# Patient Record
Sex: Female | Born: 1940 | Race: Black or African American | Hispanic: No | Smoking: Never smoker
Health system: Southern US, Community
[De-identification: ages and names within clinical notes are randomized; demographics above are authoritative.]

## PROBLEM LIST (undated history)

## (undated) DIAGNOSIS — IMO0001 Reserved for inherently not codable concepts without codable children: Secondary | ICD-10-CM

## (undated) DIAGNOSIS — K589 Irritable bowel syndrome without diarrhea: Secondary | ICD-10-CM

## (undated) DIAGNOSIS — E876 Hypokalemia: Secondary | ICD-10-CM

## (undated) DIAGNOSIS — K5909 Other constipation: Secondary | ICD-10-CM

## (undated) DIAGNOSIS — K219 Gastro-esophageal reflux disease without esophagitis: Secondary | ICD-10-CM

## (undated) DIAGNOSIS — M25559 Pain in unspecified hip: Secondary | ICD-10-CM

## (undated) DIAGNOSIS — M069 Rheumatoid arthritis, unspecified: Secondary | ICD-10-CM

## (undated) DIAGNOSIS — K921 Melena: Secondary | ICD-10-CM

## (undated) DIAGNOSIS — IMO0002 Reserved for concepts with insufficient information to code with codable children: Secondary | ICD-10-CM

## (undated) DIAGNOSIS — H5450 Low vision, one eye, unspecified eye: Secondary | ICD-10-CM

## (undated) DIAGNOSIS — L910 Hypertrophic scar: Secondary | ICD-10-CM

## (undated) DIAGNOSIS — C50919 Malignant neoplasm of unspecified site of unspecified female breast: Secondary | ICD-10-CM

## (undated) DIAGNOSIS — I10 Essential (primary) hypertension: Secondary | ICD-10-CM

## (undated) DIAGNOSIS — R1084 Generalized abdominal pain: Secondary | ICD-10-CM

## (undated) DIAGNOSIS — R5383 Other fatigue: Secondary | ICD-10-CM

## (undated) DIAGNOSIS — E785 Hyperlipidemia, unspecified: Secondary | ICD-10-CM

## (undated) DIAGNOSIS — R300 Dysuria: Secondary | ICD-10-CM

## (undated) DIAGNOSIS — H409 Unspecified glaucoma: Secondary | ICD-10-CM

## (undated) DIAGNOSIS — R1011 Right upper quadrant pain: Secondary | ICD-10-CM

## (undated) DIAGNOSIS — K760 Fatty (change of) liver, not elsewhere classified: Secondary | ICD-10-CM

## (undated) DIAGNOSIS — M1990 Unspecified osteoarthritis, unspecified site: Secondary | ICD-10-CM

## (undated) DIAGNOSIS — Z86010 Personal history of colonic polyps: Secondary | ICD-10-CM

## (undated) DIAGNOSIS — Z923 Personal history of irradiation: Secondary | ICD-10-CM

## (undated) DIAGNOSIS — N281 Cyst of kidney, acquired: Secondary | ICD-10-CM

## (undated) DIAGNOSIS — R1013 Epigastric pain: Secondary | ICD-10-CM

## (undated) DIAGNOSIS — R209 Unspecified disturbances of skin sensation: Secondary | ICD-10-CM

## (undated) DIAGNOSIS — G4700 Insomnia, unspecified: Secondary | ICD-10-CM

## (undated) DIAGNOSIS — R110 Nausea: Secondary | ICD-10-CM

## (undated) DIAGNOSIS — D126 Benign neoplasm of colon, unspecified: Secondary | ICD-10-CM

## (undated) DIAGNOSIS — K648 Other hemorrhoids: Secondary | ICD-10-CM

## (undated) DIAGNOSIS — R011 Cardiac murmur, unspecified: Secondary | ICD-10-CM

## (undated) DIAGNOSIS — R5381 Other malaise: Secondary | ICD-10-CM

## (undated) DIAGNOSIS — J209 Acute bronchitis, unspecified: Secondary | ICD-10-CM

## (undated) DIAGNOSIS — E039 Hypothyroidism, unspecified: Secondary | ICD-10-CM

## (undated) DIAGNOSIS — Z853 Personal history of malignant neoplasm of breast: Secondary | ICD-10-CM

## (undated) DIAGNOSIS — K7689 Other specified diseases of liver: Secondary | ICD-10-CM

## (undated) DIAGNOSIS — R079 Chest pain, unspecified: Secondary | ICD-10-CM

## (undated) DIAGNOSIS — M549 Dorsalgia, unspecified: Secondary | ICD-10-CM

## (undated) DIAGNOSIS — D369 Benign neoplasm, unspecified site: Secondary | ICD-10-CM

## (undated) HISTORY — DX: Melena: K92.1

## (undated) HISTORY — DX: Right upper quadrant pain: R10.11

## (undated) HISTORY — DX: Insomnia, unspecified: G47.00

## (undated) HISTORY — DX: Fatty (change of) liver, not elsewhere classified: K76.0

## (undated) HISTORY — DX: Gastro-esophageal reflux disease without esophagitis: K21.9

## (undated) HISTORY — PX: OTHER SURGICAL HISTORY: SHX169

## (undated) HISTORY — DX: Rheumatoid arthritis, unspecified: M06.9

## (undated) HISTORY — DX: Essential (primary) hypertension: I10

## (undated) HISTORY — DX: Irritable bowel syndrome without diarrhea: K58.9

## (undated) HISTORY — DX: Reserved for concepts with insufficient information to code with codable children: IMO0002

## (undated) HISTORY — DX: Generalized abdominal pain: R10.84

## (undated) HISTORY — DX: Nausea: R11.0

## (undated) HISTORY — DX: Unspecified disturbances of skin sensation: R20.9

## (undated) HISTORY — PX: TOE SURGERY: SHX1073

## (undated) HISTORY — DX: Personal history of colonic polyps: Z86.010

## (undated) HISTORY — DX: Hypokalemia: E87.6

## (undated) HISTORY — PX: SHOULDER SURGERY: SHX246

## (undated) HISTORY — DX: Malignant neoplasm of unspecified site of unspecified female breast: C50.919

## (undated) HISTORY — DX: Unspecified glaucoma: H40.9

## (undated) HISTORY — DX: Cyst of kidney, acquired: N28.1

## (undated) HISTORY — DX: Unspecified osteoarthritis, unspecified site: M19.90

## (undated) HISTORY — DX: Epigastric pain: R10.13

## (undated) HISTORY — DX: Cardiac murmur, unspecified: R01.1

## (undated) HISTORY — PX: TUBAL LIGATION: SHX77

## (undated) HISTORY — DX: Benign neoplasm, unspecified site: D36.9

## (undated) HISTORY — DX: Hyperlipidemia, unspecified: E78.5

## (undated) HISTORY — DX: Benign neoplasm of colon, unspecified: D12.6

## (undated) HISTORY — DX: Hypothyroidism, unspecified: E03.9

## (undated) HISTORY — DX: Reserved for inherently not codable concepts without codable children: IMO0001

## (undated) HISTORY — DX: Acute bronchitis, unspecified: J20.9

## (undated) HISTORY — DX: Hypertrophic scar: L91.0

## (undated) HISTORY — DX: Other hemorrhoids: K64.8

## (undated) HISTORY — PX: OVARIAN CYST REMOVAL: SHX89

## (undated) HISTORY — DX: Other constipation: K59.09

## (undated) HISTORY — DX: Other malaise: R53.81

## (undated) HISTORY — DX: Dysuria: R30.0

## (undated) HISTORY — DX: Other fatigue: R53.83

## (undated) HISTORY — DX: Personal history of malignant neoplasm of breast: Z85.3

## (undated) HISTORY — DX: Other specified diseases of liver: K76.89

## (undated) HISTORY — DX: Chest pain, unspecified: R07.9

## (undated) HISTORY — DX: Dorsalgia, unspecified: M54.9

## (undated) HISTORY — DX: Low vision, one eye, unspecified eye: H54.50

## (undated) HISTORY — DX: Pain in unspecified hip: M25.559

---

## 1997-09-24 ENCOUNTER — Other Ambulatory Visit: Admission: RE | Admit: 1997-09-24 | Discharge: 1997-09-24 | Payer: Self-pay | Admitting: *Deleted

## 1998-09-28 ENCOUNTER — Other Ambulatory Visit: Admission: RE | Admit: 1998-09-28 | Discharge: 1998-09-28 | Payer: Self-pay | Admitting: *Deleted

## 1999-06-12 ENCOUNTER — Encounter: Payer: Self-pay | Admitting: Internal Medicine

## 1999-06-12 ENCOUNTER — Ambulatory Visit (HOSPITAL_COMMUNITY): Admission: RE | Admit: 1999-06-12 | Discharge: 1999-06-12 | Payer: Self-pay | Admitting: Internal Medicine

## 1999-09-19 ENCOUNTER — Encounter: Admission: RE | Admit: 1999-09-19 | Discharge: 1999-09-19 | Payer: Self-pay | Admitting: *Deleted

## 1999-09-19 ENCOUNTER — Encounter: Payer: Self-pay | Admitting: *Deleted

## 1999-10-10 ENCOUNTER — Other Ambulatory Visit: Admission: RE | Admit: 1999-10-10 | Discharge: 1999-10-10 | Payer: Self-pay | Admitting: *Deleted

## 1999-11-16 ENCOUNTER — Encounter: Payer: Self-pay | Admitting: General Surgery

## 1999-11-16 ENCOUNTER — Encounter: Admission: RE | Admit: 1999-11-16 | Discharge: 1999-11-16 | Payer: Self-pay | Admitting: General Surgery

## 1999-11-17 ENCOUNTER — Ambulatory Visit (HOSPITAL_BASED_OUTPATIENT_CLINIC_OR_DEPARTMENT_OTHER): Admission: RE | Admit: 1999-11-17 | Discharge: 1999-11-17 | Payer: Self-pay | Admitting: General Surgery

## 1999-11-17 ENCOUNTER — Encounter (INDEPENDENT_AMBULATORY_CARE_PROVIDER_SITE_OTHER): Payer: Self-pay | Admitting: *Deleted

## 2000-03-21 ENCOUNTER — Encounter (INDEPENDENT_AMBULATORY_CARE_PROVIDER_SITE_OTHER): Payer: Self-pay

## 2000-03-21 ENCOUNTER — Other Ambulatory Visit: Admission: RE | Admit: 2000-03-21 | Discharge: 2000-03-21 | Payer: Self-pay | Admitting: *Deleted

## 2000-04-08 DIAGNOSIS — D126 Benign neoplasm of colon, unspecified: Secondary | ICD-10-CM

## 2000-04-08 HISTORY — DX: Benign neoplasm of colon, unspecified: D12.6

## 2000-04-18 ENCOUNTER — Encounter (INDEPENDENT_AMBULATORY_CARE_PROVIDER_SITE_OTHER): Payer: Self-pay | Admitting: Specialist

## 2000-04-18 ENCOUNTER — Other Ambulatory Visit: Admission: RE | Admit: 2000-04-18 | Discharge: 2000-04-18 | Payer: Self-pay | Admitting: Gastroenterology

## 2000-12-06 ENCOUNTER — Other Ambulatory Visit: Admission: RE | Admit: 2000-12-06 | Discharge: 2000-12-06 | Payer: Self-pay | Admitting: Obstetrics & Gynecology

## 2000-12-17 ENCOUNTER — Encounter: Payer: Self-pay | Admitting: Obstetrics & Gynecology

## 2000-12-17 ENCOUNTER — Encounter: Admission: RE | Admit: 2000-12-17 | Discharge: 2000-12-17 | Payer: Self-pay | Admitting: Obstetrics & Gynecology

## 2001-12-10 ENCOUNTER — Encounter: Admission: RE | Admit: 2001-12-10 | Discharge: 2001-12-10 | Payer: Self-pay | Admitting: Internal Medicine

## 2001-12-10 ENCOUNTER — Encounter: Payer: Self-pay | Admitting: Internal Medicine

## 2001-12-19 ENCOUNTER — Encounter: Payer: Self-pay | Admitting: Internal Medicine

## 2001-12-19 ENCOUNTER — Ambulatory Visit (HOSPITAL_COMMUNITY): Admission: RE | Admit: 2001-12-19 | Discharge: 2001-12-19 | Payer: Self-pay | Admitting: Internal Medicine

## 2002-02-24 ENCOUNTER — Other Ambulatory Visit: Admission: RE | Admit: 2002-02-24 | Discharge: 2002-02-24 | Payer: Self-pay | Admitting: Obstetrics and Gynecology

## 2002-04-14 ENCOUNTER — Encounter: Payer: Self-pay | Admitting: Obstetrics & Gynecology

## 2002-04-14 ENCOUNTER — Encounter: Admission: RE | Admit: 2002-04-14 | Discharge: 2002-04-14 | Payer: Self-pay | Admitting: Obstetrics & Gynecology

## 2002-04-26 ENCOUNTER — Encounter: Payer: Self-pay | Admitting: Internal Medicine

## 2002-04-26 ENCOUNTER — Encounter: Admission: RE | Admit: 2002-04-26 | Discharge: 2002-04-26 | Payer: Self-pay | Admitting: Internal Medicine

## 2002-10-06 ENCOUNTER — Ambulatory Visit (HOSPITAL_COMMUNITY): Admission: RE | Admit: 2002-10-06 | Discharge: 2002-10-06 | Payer: Self-pay | Admitting: General Surgery

## 2002-10-06 ENCOUNTER — Ambulatory Visit (HOSPITAL_BASED_OUTPATIENT_CLINIC_OR_DEPARTMENT_OTHER): Admission: RE | Admit: 2002-10-06 | Discharge: 2002-10-06 | Payer: Self-pay | Admitting: General Surgery

## 2002-10-06 ENCOUNTER — Encounter (INDEPENDENT_AMBULATORY_CARE_PROVIDER_SITE_OTHER): Payer: Self-pay | Admitting: *Deleted

## 2003-06-01 ENCOUNTER — Encounter: Admission: RE | Admit: 2003-06-01 | Discharge: 2003-06-01 | Payer: Self-pay | Admitting: Internal Medicine

## 2003-06-10 ENCOUNTER — Encounter: Admission: RE | Admit: 2003-06-10 | Discharge: 2003-06-10 | Payer: Self-pay | Admitting: Endocrinology

## 2003-06-18 ENCOUNTER — Ambulatory Visit (HOSPITAL_COMMUNITY): Admission: RE | Admit: 2003-06-18 | Discharge: 2003-06-18 | Payer: Self-pay | Admitting: Endocrinology

## 2003-06-18 ENCOUNTER — Encounter (INDEPENDENT_AMBULATORY_CARE_PROVIDER_SITE_OTHER): Payer: Self-pay | Admitting: Specialist

## 2003-08-24 ENCOUNTER — Encounter: Admission: RE | Admit: 2003-08-24 | Discharge: 2003-08-24 | Payer: Self-pay | Admitting: *Deleted

## 2003-11-24 ENCOUNTER — Ambulatory Visit (HOSPITAL_COMMUNITY): Admission: RE | Admit: 2003-11-24 | Discharge: 2003-11-24 | Payer: Self-pay | Admitting: Gastroenterology

## 2003-11-26 ENCOUNTER — Observation Stay (HOSPITAL_COMMUNITY): Admission: RE | Admit: 2003-11-26 | Discharge: 2003-11-27 | Payer: Self-pay | Admitting: General Surgery

## 2003-11-26 ENCOUNTER — Encounter (INDEPENDENT_AMBULATORY_CARE_PROVIDER_SITE_OTHER): Payer: Self-pay | Admitting: Specialist

## 2004-02-11 ENCOUNTER — Ambulatory Visit: Payer: Self-pay | Admitting: Internal Medicine

## 2004-04-08 ENCOUNTER — Emergency Department (HOSPITAL_COMMUNITY): Admission: EM | Admit: 2004-04-08 | Discharge: 2004-04-08 | Payer: Self-pay | Admitting: Family Medicine

## 2004-08-09 ENCOUNTER — Ambulatory Visit: Payer: Self-pay | Admitting: Gastroenterology

## 2004-08-14 ENCOUNTER — Ambulatory Visit: Payer: Self-pay | Admitting: Gastroenterology

## 2004-10-11 ENCOUNTER — Encounter: Admission: RE | Admit: 2004-10-11 | Discharge: 2004-10-11 | Payer: Self-pay | Admitting: Internal Medicine

## 2005-06-04 ENCOUNTER — Emergency Department (HOSPITAL_COMMUNITY): Admission: EM | Admit: 2005-06-04 | Discharge: 2005-06-04 | Payer: Self-pay | Admitting: Emergency Medicine

## 2005-09-06 ENCOUNTER — Ambulatory Visit: Payer: Self-pay | Admitting: Endocrinology

## 2005-10-04 ENCOUNTER — Ambulatory Visit: Payer: Self-pay | Admitting: Internal Medicine

## 2005-10-15 ENCOUNTER — Ambulatory Visit: Payer: Self-pay | Admitting: Internal Medicine

## 2005-10-25 ENCOUNTER — Encounter: Admission: RE | Admit: 2005-10-25 | Discharge: 2005-10-25 | Payer: Self-pay | Admitting: Internal Medicine

## 2006-01-15 ENCOUNTER — Ambulatory Visit: Payer: Self-pay | Admitting: Internal Medicine

## 2006-04-23 ENCOUNTER — Emergency Department (HOSPITAL_COMMUNITY): Admission: EM | Admit: 2006-04-23 | Discharge: 2006-04-23 | Payer: Self-pay | Admitting: Emergency Medicine

## 2006-07-02 ENCOUNTER — Ambulatory Visit: Payer: Self-pay | Admitting: Internal Medicine

## 2006-07-02 LAB — CONVERTED CEMR LAB
ALT: 15 units/L (ref 0–40)
AST: 22 units/L (ref 0–37)
Bacteria, UA: NEGATIVE
Bilirubin, Direct: 0.1 mg/dL (ref 0.0–0.3)
CO2: 33 meq/L — ABNORMAL HIGH (ref 19–32)
Calcium: 10.1 mg/dL (ref 8.4–10.5)
Chloride: 104 meq/L (ref 96–112)
Eosinophils Relative: 2.8 % (ref 0.0–5.0)
GFR calc non Af Amer: 59 mL/min
Glucose, Bld: 91 mg/dL (ref 70–99)
HCT: 35.1 % — ABNORMAL LOW (ref 36.0–46.0)
Hemoglobin: 11.7 g/dL — ABNORMAL LOW (ref 12.0–15.0)
MCV: 86.5 fL (ref 78.0–100.0)
Neutrophils Relative %: 57.5 % (ref 43.0–77.0)
RBC: 4.05 M/uL (ref 3.87–5.11)
RDW: 13.1 % (ref 11.5–14.6)
Total CHOL/HDL Ratio: 4.2
Total Protein, Urine: NEGATIVE mg/dL
Triglycerides: 96 mg/dL (ref 0–149)
WBC: 9.7 10*3/uL (ref 4.5–10.5)

## 2006-07-17 ENCOUNTER — Ambulatory Visit: Payer: Self-pay | Admitting: Gastroenterology

## 2006-08-07 ENCOUNTER — Ambulatory Visit: Payer: Self-pay | Admitting: Gastroenterology

## 2006-08-07 ENCOUNTER — Encounter: Payer: Self-pay | Admitting: Gastroenterology

## 2006-11-19 ENCOUNTER — Encounter: Payer: Self-pay | Admitting: Internal Medicine

## 2006-11-19 DIAGNOSIS — K219 Gastro-esophageal reflux disease without esophagitis: Secondary | ICD-10-CM

## 2006-11-19 DIAGNOSIS — K7689 Other specified diseases of liver: Secondary | ICD-10-CM | POA: Insufficient documentation

## 2006-11-19 DIAGNOSIS — M069 Rheumatoid arthritis, unspecified: Secondary | ICD-10-CM | POA: Insufficient documentation

## 2006-11-19 DIAGNOSIS — K589 Irritable bowel syndrome without diarrhea: Secondary | ICD-10-CM | POA: Insufficient documentation

## 2006-11-19 DIAGNOSIS — M797 Fibromyalgia: Secondary | ICD-10-CM

## 2006-11-19 DIAGNOSIS — IMO0001 Reserved for inherently not codable concepts without codable children: Secondary | ICD-10-CM

## 2006-11-19 HISTORY — DX: Irritable bowel syndrome, unspecified: K58.9

## 2006-11-19 HISTORY — DX: Rheumatoid arthritis, unspecified: M06.9

## 2006-11-19 HISTORY — DX: Other specified diseases of liver: K76.89

## 2006-11-19 HISTORY — DX: Reserved for inherently not codable concepts without codable children: IMO0001

## 2006-11-19 HISTORY — DX: Gastro-esophageal reflux disease without esophagitis: K21.9

## 2006-11-26 ENCOUNTER — Encounter: Admission: RE | Admit: 2006-11-26 | Discharge: 2006-11-26 | Payer: Self-pay | Admitting: Internal Medicine

## 2006-12-02 ENCOUNTER — Telehealth: Payer: Self-pay | Admitting: Internal Medicine

## 2006-12-03 ENCOUNTER — Encounter: Admission: RE | Admit: 2006-12-03 | Discharge: 2006-12-03 | Payer: Self-pay | Admitting: Internal Medicine

## 2006-12-13 ENCOUNTER — Encounter: Admission: RE | Admit: 2006-12-13 | Discharge: 2006-12-13 | Payer: Self-pay | Admitting: Internal Medicine

## 2006-12-17 ENCOUNTER — Encounter: Admission: RE | Admit: 2006-12-17 | Discharge: 2006-12-17 | Payer: Self-pay | Admitting: Internal Medicine

## 2006-12-18 ENCOUNTER — Telehealth (INDEPENDENT_AMBULATORY_CARE_PROVIDER_SITE_OTHER): Payer: Self-pay | Admitting: *Deleted

## 2006-12-26 ENCOUNTER — Ambulatory Visit: Payer: Self-pay | Admitting: Internal Medicine

## 2006-12-26 DIAGNOSIS — I10 Essential (primary) hypertension: Secondary | ICD-10-CM

## 2006-12-26 DIAGNOSIS — R5382 Chronic fatigue, unspecified: Secondary | ICD-10-CM

## 2006-12-26 DIAGNOSIS — R5381 Other malaise: Secondary | ICD-10-CM

## 2006-12-26 DIAGNOSIS — G4700 Insomnia, unspecified: Secondary | ICD-10-CM | POA: Insufficient documentation

## 2006-12-26 DIAGNOSIS — E039 Hypothyroidism, unspecified: Secondary | ICD-10-CM

## 2006-12-26 HISTORY — DX: Other malaise: R53.81

## 2006-12-26 HISTORY — DX: Insomnia, unspecified: G47.00

## 2006-12-26 HISTORY — DX: Essential (primary) hypertension: I10

## 2006-12-26 HISTORY — DX: Hypothyroidism, unspecified: E03.9

## 2006-12-27 LAB — CONVERTED CEMR LAB
Bilirubin Urine: NEGATIVE
Bilirubin, Direct: 0.1 mg/dL (ref 0.0–0.3)
CO2: 32 meq/L (ref 19–32)
Eosinophils Absolute: 0.3 10*3/uL (ref 0.0–0.6)
Eosinophils Relative: 3.7 % (ref 0.0–5.0)
GFR calc Af Amer: 58 mL/min
Glucose, Bld: 134 mg/dL — ABNORMAL HIGH (ref 70–99)
Ketones, ur: NEGATIVE mg/dL
Lymphocytes Relative: 37.5 % (ref 12.0–46.0)
MCV: 87.5 fL (ref 78.0–100.0)
Monocytes Absolute: 0.6 10*3/uL (ref 0.2–0.7)
Mucus, UA: NEGATIVE
Neutro Abs: 4.5 10*3/uL (ref 1.4–7.7)
Neutrophils Relative %: 51.3 % (ref 43.0–77.0)
Nitrite: NEGATIVE
Platelets: 259 10*3/uL (ref 150–400)
Potassium: 3.8 meq/L (ref 3.5–5.1)
Total Bilirubin: 0.7 mg/dL (ref 0.3–1.2)
Total Protein, Urine: NEGATIVE mg/dL
Total Protein: 7 g/dL (ref 6.0–8.3)
Urine Glucose: NEGATIVE mg/dL
Urobilinogen, UA: 0.2 (ref 0.0–1.0)
WBC: 8.8 10*3/uL (ref 4.5–10.5)

## 2007-01-09 DIAGNOSIS — C50919 Malignant neoplasm of unspecified site of unspecified female breast: Secondary | ICD-10-CM

## 2007-01-09 HISTORY — PX: OTHER SURGICAL HISTORY: SHX169

## 2007-01-09 HISTORY — DX: Malignant neoplasm of unspecified site of unspecified female breast: C50.919

## 2007-01-09 HISTORY — PX: BREAST SURGERY: SHX581

## 2007-01-09 HISTORY — PX: BREAST LUMPECTOMY: SHX2

## 2007-01-10 ENCOUNTER — Encounter: Admission: RE | Admit: 2007-01-10 | Discharge: 2007-01-10 | Payer: Self-pay | Admitting: Internal Medicine

## 2007-01-10 ENCOUNTER — Encounter (INDEPENDENT_AMBULATORY_CARE_PROVIDER_SITE_OTHER): Payer: Self-pay | Admitting: Diagnostic Radiology

## 2007-01-10 ENCOUNTER — Encounter: Payer: Self-pay | Admitting: Internal Medicine

## 2007-01-17 ENCOUNTER — Telehealth: Payer: Self-pay | Admitting: Internal Medicine

## 2007-02-07 ENCOUNTER — Encounter: Payer: Self-pay | Admitting: Internal Medicine

## 2007-02-15 ENCOUNTER — Ambulatory Visit: Payer: Self-pay | Admitting: Family Medicine

## 2007-02-15 DIAGNOSIS — J209 Acute bronchitis, unspecified: Secondary | ICD-10-CM

## 2007-02-15 HISTORY — DX: Acute bronchitis, unspecified: J20.9

## 2007-02-28 ENCOUNTER — Ambulatory Visit (HOSPITAL_BASED_OUTPATIENT_CLINIC_OR_DEPARTMENT_OTHER): Admission: RE | Admit: 2007-02-28 | Discharge: 2007-02-28 | Payer: Self-pay | Admitting: General Surgery

## 2007-02-28 ENCOUNTER — Encounter: Admission: RE | Admit: 2007-02-28 | Discharge: 2007-02-28 | Payer: Self-pay | Admitting: General Surgery

## 2007-02-28 ENCOUNTER — Encounter: Payer: Self-pay | Admitting: Internal Medicine

## 2007-02-28 ENCOUNTER — Encounter (HOSPITAL_BASED_OUTPATIENT_CLINIC_OR_DEPARTMENT_OTHER): Payer: Self-pay | Admitting: General Surgery

## 2007-03-20 ENCOUNTER — Ambulatory Visit (HOSPITAL_BASED_OUTPATIENT_CLINIC_OR_DEPARTMENT_OTHER): Admission: RE | Admit: 2007-03-20 | Discharge: 2007-03-20 | Payer: Self-pay | Admitting: General Surgery

## 2007-03-20 ENCOUNTER — Encounter (HOSPITAL_BASED_OUTPATIENT_CLINIC_OR_DEPARTMENT_OTHER): Payer: Self-pay | Admitting: General Surgery

## 2007-04-08 ENCOUNTER — Ambulatory Visit: Payer: Self-pay | Admitting: Oncology

## 2007-04-11 ENCOUNTER — Ambulatory Visit: Payer: Self-pay | Admitting: Internal Medicine

## 2007-04-13 LAB — CONVERTED CEMR LAB: Hgb A1c MFr Bld: 6 % (ref 4.6–6.0)

## 2007-04-15 ENCOUNTER — Ambulatory Visit: Admission: RE | Admit: 2007-04-15 | Discharge: 2007-07-03 | Payer: Self-pay | Admitting: Radiation Oncology

## 2007-04-16 ENCOUNTER — Ambulatory Visit: Payer: Self-pay | Admitting: Internal Medicine

## 2007-04-16 DIAGNOSIS — Z853 Personal history of malignant neoplasm of breast: Secondary | ICD-10-CM

## 2007-04-16 HISTORY — DX: Personal history of malignant neoplasm of breast: Z85.3

## 2007-05-10 DIAGNOSIS — K5909 Other constipation: Secondary | ICD-10-CM

## 2007-05-10 DIAGNOSIS — K5900 Constipation, unspecified: Secondary | ICD-10-CM | POA: Insufficient documentation

## 2007-05-10 DIAGNOSIS — K648 Other hemorrhoids: Secondary | ICD-10-CM | POA: Insufficient documentation

## 2007-05-10 DIAGNOSIS — K921 Melena: Secondary | ICD-10-CM

## 2007-05-10 HISTORY — DX: Melena: K92.1

## 2007-05-10 HISTORY — DX: Other hemorrhoids: K64.8

## 2007-05-10 HISTORY — DX: Other constipation: K59.09

## 2007-06-03 ENCOUNTER — Ambulatory Visit: Payer: Self-pay | Admitting: Oncology

## 2007-07-01 ENCOUNTER — Telehealth: Payer: Self-pay | Admitting: Gastroenterology

## 2007-07-16 ENCOUNTER — Encounter: Payer: Self-pay | Admitting: Internal Medicine

## 2007-07-16 ENCOUNTER — Telehealth: Payer: Self-pay | Admitting: Internal Medicine

## 2007-07-23 ENCOUNTER — Ambulatory Visit: Payer: Self-pay | Admitting: Internal Medicine

## 2007-07-23 LAB — CONVERTED CEMR LAB
ALT: 17 units/L (ref 0–35)
AST: 18 units/L (ref 0–37)
Albumin: 3.8 g/dL (ref 3.5–5.2)
BUN: 14 mg/dL (ref 6–23)
Bilirubin Urine: NEGATIVE
CO2: 33 meq/L — ABNORMAL HIGH (ref 19–32)
Calcium: 9.8 mg/dL (ref 8.4–10.5)
Creatinine, Ser: 1 mg/dL (ref 0.4–1.2)
Ketones, ur: NEGATIVE mg/dL
Nitrite: NEGATIVE
TSH: 2.67 microintl units/mL (ref 0.35–5.50)
Total Bilirubin: 0.7 mg/dL (ref 0.3–1.2)
Total Protein, Urine: NEGATIVE mg/dL
Total Protein: 6.8 g/dL (ref 6.0–8.3)

## 2007-07-25 ENCOUNTER — Ambulatory Visit: Payer: Self-pay | Admitting: Internal Medicine

## 2007-07-25 DIAGNOSIS — H5450 Low vision, one eye, unspecified eye: Secondary | ICD-10-CM | POA: Insufficient documentation

## 2007-07-25 DIAGNOSIS — R011 Cardiac murmur, unspecified: Secondary | ICD-10-CM | POA: Insufficient documentation

## 2007-07-25 DIAGNOSIS — L910 Hypertrophic scar: Secondary | ICD-10-CM

## 2007-07-25 HISTORY — DX: Low vision, one eye, unspecified eye: H54.50

## 2007-07-25 HISTORY — DX: Hypertrophic scar: L91.0

## 2007-07-25 HISTORY — DX: Cardiac murmur, unspecified: R01.1

## 2007-07-28 ENCOUNTER — Encounter: Admission: RE | Admit: 2007-07-28 | Discharge: 2007-07-28 | Payer: Self-pay | Admitting: Internal Medicine

## 2007-07-30 ENCOUNTER — Encounter: Payer: Self-pay | Admitting: Internal Medicine

## 2007-09-02 ENCOUNTER — Ambulatory Visit: Payer: Self-pay | Admitting: Oncology

## 2007-10-01 ENCOUNTER — Telehealth: Payer: Self-pay | Admitting: Gastroenterology

## 2007-10-01 ENCOUNTER — Encounter: Payer: Self-pay | Admitting: Gastroenterology

## 2007-10-01 ENCOUNTER — Encounter: Payer: Self-pay | Admitting: Internal Medicine

## 2007-10-01 DIAGNOSIS — Z860100 Personal history of colon polyps, unspecified: Secondary | ICD-10-CM

## 2007-10-01 HISTORY — DX: Personal history of colon polyps, unspecified: Z86.0100

## 2007-10-01 HISTORY — DX: Personal history of colonic polyps: Z86.010

## 2007-10-02 ENCOUNTER — Ambulatory Visit: Payer: Self-pay | Admitting: Gastroenterology

## 2007-10-07 ENCOUNTER — Telehealth: Payer: Self-pay | Admitting: Internal Medicine

## 2007-11-24 ENCOUNTER — Ambulatory Visit: Payer: Self-pay | Admitting: Internal Medicine

## 2007-11-24 LAB — CONVERTED CEMR LAB
BUN: 16 mg/dL (ref 6–23)
CO2: 34 meq/L — ABNORMAL HIGH (ref 19–32)
Chloride: 102 meq/L (ref 96–112)
Cholesterol: 243 mg/dL (ref 0–200)
Creatinine, Ser: 1 mg/dL (ref 0.4–1.2)
Glucose, Bld: 103 mg/dL — ABNORMAL HIGH (ref 70–99)
Potassium: 3.3 meq/L — ABNORMAL LOW (ref 3.5–5.1)
VLDL: 17 mg/dL (ref 0–40)

## 2007-11-25 ENCOUNTER — Ambulatory Visit: Payer: Self-pay | Admitting: Internal Medicine

## 2007-11-25 DIAGNOSIS — E876 Hypokalemia: Secondary | ICD-10-CM

## 2007-11-25 DIAGNOSIS — F329 Major depressive disorder, single episode, unspecified: Secondary | ICD-10-CM | POA: Insufficient documentation

## 2007-11-25 HISTORY — DX: Hypokalemia: E87.6

## 2007-12-17 ENCOUNTER — Encounter: Admission: RE | Admit: 2007-12-17 | Discharge: 2007-12-17 | Payer: Self-pay | Admitting: General Surgery

## 2007-12-19 ENCOUNTER — Telehealth: Payer: Self-pay | Admitting: Internal Medicine

## 2008-02-05 ENCOUNTER — Ambulatory Visit: Payer: Self-pay | Admitting: Gastroenterology

## 2008-02-05 DIAGNOSIS — R1084 Generalized abdominal pain: Secondary | ICD-10-CM | POA: Insufficient documentation

## 2008-02-05 DIAGNOSIS — R1314 Dysphagia, pharyngoesophageal phase: Secondary | ICD-10-CM

## 2008-02-05 HISTORY — DX: Generalized abdominal pain: R10.84

## 2008-02-11 ENCOUNTER — Ambulatory Visit: Payer: Self-pay | Admitting: Gastroenterology

## 2008-03-12 ENCOUNTER — Ambulatory Visit: Payer: Self-pay | Admitting: Oncology

## 2008-03-22 ENCOUNTER — Ambulatory Visit: Payer: Self-pay | Admitting: Internal Medicine

## 2008-03-22 LAB — CONVERTED CEMR LAB
BUN: 13 mg/dL (ref 6–23)
CO2: 30 meq/L (ref 19–32)
Chloride: 108 meq/L (ref 96–112)
Creatinine, Ser: 1 mg/dL (ref 0.4–1.2)
GFR calc non Af Amer: 59 mL/min
Glucose, Bld: 119 mg/dL — ABNORMAL HIGH (ref 70–99)
Potassium: 4.1 meq/L (ref 3.5–5.1)

## 2008-04-06 ENCOUNTER — Ambulatory Visit: Payer: Self-pay | Admitting: Internal Medicine

## 2008-04-06 DIAGNOSIS — R209 Unspecified disturbances of skin sensation: Secondary | ICD-10-CM

## 2008-04-06 DIAGNOSIS — R300 Dysuria: Secondary | ICD-10-CM | POA: Insufficient documentation

## 2008-04-06 HISTORY — DX: Dysuria: R30.0

## 2008-04-06 HISTORY — DX: Unspecified disturbances of skin sensation: R20.9

## 2008-04-08 LAB — CONVERTED CEMR LAB
ALT: 20 units/L (ref 0–35)
AST: 25 units/L (ref 0–37)
Basophils Relative: 0.5 % (ref 0.0–3.0)
Bilirubin Urine: NEGATIVE
Bilirubin, Direct: 0.1 mg/dL (ref 0.0–0.3)
Eosinophils Relative: 2 % (ref 0.0–5.0)
HCT: 36.3 % (ref 36.0–46.0)
Lymphs Abs: 2.2 10*3/uL (ref 0.7–4.0)
MCV: 87.7 fL (ref 78.0–100.0)
Monocytes Absolute: 0.6 10*3/uL (ref 0.1–1.0)
Monocytes Relative: 8.5 % (ref 3.0–12.0)
Platelets: 228 10*3/uL (ref 150.0–400.0)
Pro B Natriuretic peptide (BNP): 96 pg/mL (ref 0.0–100.0)
RBC: 4.13 M/uL (ref 3.87–5.11)
Total Bilirubin: 0.6 mg/dL (ref 0.3–1.2)
Total Protein, Urine: NEGATIVE mg/dL
Total Protein: 7.1 g/dL (ref 6.0–8.3)
Urine Glucose: NEGATIVE mg/dL
Urobilinogen, UA: 0.2 (ref 0.0–1.0)
WBC: 7.3 10*3/uL (ref 4.5–10.5)

## 2008-04-14 ENCOUNTER — Telehealth: Payer: Self-pay | Admitting: Internal Medicine

## 2008-05-03 ENCOUNTER — Encounter: Payer: Self-pay | Admitting: Internal Medicine

## 2008-06-20 ENCOUNTER — Emergency Department (HOSPITAL_COMMUNITY): Admission: EM | Admit: 2008-06-20 | Discharge: 2008-06-20 | Payer: Self-pay | Admitting: Emergency Medicine

## 2008-07-19 ENCOUNTER — Telehealth (INDEPENDENT_AMBULATORY_CARE_PROVIDER_SITE_OTHER): Payer: Self-pay | Admitting: *Deleted

## 2008-07-20 ENCOUNTER — Ambulatory Visit: Payer: Self-pay | Admitting: Internal Medicine

## 2008-07-20 DIAGNOSIS — M25559 Pain in unspecified hip: Secondary | ICD-10-CM

## 2008-07-20 DIAGNOSIS — M549 Dorsalgia, unspecified: Secondary | ICD-10-CM | POA: Insufficient documentation

## 2008-07-20 DIAGNOSIS — R509 Fever, unspecified: Secondary | ICD-10-CM | POA: Insufficient documentation

## 2008-07-20 HISTORY — DX: Dorsalgia, unspecified: M54.9

## 2008-07-20 HISTORY — DX: Pain in unspecified hip: M25.559

## 2008-07-20 LAB — CONVERTED CEMR LAB
Bilirubin Urine: NEGATIVE
Ketones, ur: NEGATIVE mg/dL
Specific Gravity, Urine: 1.02 (ref 1.000–1.030)
Total Protein, Urine: NEGATIVE mg/dL
Urine Glucose: NEGATIVE mg/dL
pH: 6 (ref 5.0–8.0)

## 2008-07-22 ENCOUNTER — Telehealth: Payer: Self-pay | Admitting: Internal Medicine

## 2008-09-15 ENCOUNTER — Telehealth: Payer: Self-pay | Admitting: Gastroenterology

## 2008-11-08 ENCOUNTER — Telehealth: Payer: Self-pay | Admitting: Gastroenterology

## 2008-11-12 ENCOUNTER — Ambulatory Visit: Payer: Self-pay | Admitting: Oncology

## 2008-11-12 ENCOUNTER — Telehealth: Payer: Self-pay | Admitting: Internal Medicine

## 2008-11-25 ENCOUNTER — Telehealth: Payer: Self-pay | Admitting: Gastroenterology

## 2008-12-21 ENCOUNTER — Encounter: Admission: RE | Admit: 2008-12-21 | Discharge: 2008-12-21 | Payer: Self-pay | Admitting: Oncology

## 2009-02-04 ENCOUNTER — Ambulatory Visit: Payer: Self-pay | Admitting: Internal Medicine

## 2009-02-16 ENCOUNTER — Encounter: Payer: Self-pay | Admitting: Internal Medicine

## 2009-03-03 ENCOUNTER — Ambulatory Visit: Payer: Self-pay | Admitting: Internal Medicine

## 2009-03-04 LAB — CONVERTED CEMR LAB
AST: 22 units/L (ref 0–37)
Albumin: 3.9 g/dL (ref 3.5–5.2)
Alkaline Phosphatase: 93 units/L (ref 39–117)
Basophils Absolute: 0 10*3/uL (ref 0.0–0.1)
Calcium: 9.9 mg/dL (ref 8.4–10.5)
Eosinophils Relative: 2.4 % (ref 0.0–5.0)
GFR calc non Af Amer: 79.72 mL/min (ref >60)
Glucose, Bld: 100 mg/dL — ABNORMAL HIGH (ref 70–99)
Hemoglobin: 11.8 g/dL — ABNORMAL LOW (ref 12.0–15.0)
Ketones, ur: NEGATIVE mg/dL
Lymphocytes Relative: 33.5 % (ref 12.0–46.0)
Monocytes Relative: 10.5 % (ref 3.0–12.0)
Neutro Abs: 3.9 10*3/uL (ref 1.4–7.7)
RBC: 4.07 M/uL (ref 3.87–5.11)
RDW: 12.7 % (ref 11.5–14.6)
Sed Rate: 31 mm/hr — ABNORMAL HIGH (ref 0–22)
Sodium: 146 meq/L — ABNORMAL HIGH (ref 135–145)
Specific Gravity, Urine: 1.02 (ref 1.000–1.030)
Urine Glucose: NEGATIVE mg/dL
Urobilinogen, UA: 0.2 (ref 0.0–1.0)
Vitamin B-12: 1238 pg/mL — ABNORMAL HIGH (ref 211–911)
WBC: 7.4 10*3/uL (ref 4.5–10.5)
pH: 6.5 (ref 5.0–8.0)

## 2009-03-17 ENCOUNTER — Telehealth: Payer: Self-pay | Admitting: Gastroenterology

## 2009-03-21 ENCOUNTER — Telehealth: Payer: Self-pay | Admitting: Internal Medicine

## 2009-03-24 ENCOUNTER — Ambulatory Visit: Payer: Self-pay | Admitting: Gastroenterology

## 2009-03-24 ENCOUNTER — Encounter: Admission: RE | Admit: 2009-03-24 | Discharge: 2009-03-24 | Payer: Self-pay | Admitting: Gastroenterology

## 2009-03-24 DIAGNOSIS — R1013 Epigastric pain: Secondary | ICD-10-CM

## 2009-03-24 HISTORY — DX: Epigastric pain: R10.13

## 2009-04-01 ENCOUNTER — Telehealth: Payer: Self-pay | Admitting: Gastroenterology

## 2009-04-05 ENCOUNTER — Ambulatory Visit (HOSPITAL_COMMUNITY): Admission: RE | Admit: 2009-04-05 | Discharge: 2009-04-05 | Payer: Self-pay | Admitting: Gastroenterology

## 2009-04-05 ENCOUNTER — Ambulatory Visit: Payer: Self-pay | Admitting: Gastroenterology

## 2009-04-13 ENCOUNTER — Encounter: Payer: Self-pay | Admitting: Gastroenterology

## 2009-05-03 ENCOUNTER — Telehealth: Payer: Self-pay | Admitting: Internal Medicine

## 2009-06-16 ENCOUNTER — Encounter (INDEPENDENT_AMBULATORY_CARE_PROVIDER_SITE_OTHER): Payer: Self-pay | Admitting: *Deleted

## 2009-07-13 ENCOUNTER — Encounter (INDEPENDENT_AMBULATORY_CARE_PROVIDER_SITE_OTHER): Payer: Self-pay | Admitting: *Deleted

## 2009-07-15 ENCOUNTER — Ambulatory Visit: Payer: Self-pay | Admitting: Oncology

## 2009-07-22 ENCOUNTER — Encounter (INDEPENDENT_AMBULATORY_CARE_PROVIDER_SITE_OTHER): Payer: Self-pay

## 2009-07-26 ENCOUNTER — Ambulatory Visit: Payer: Self-pay | Admitting: Gastroenterology

## 2009-07-26 ENCOUNTER — Ambulatory Visit: Payer: Self-pay | Admitting: Internal Medicine

## 2009-07-26 DIAGNOSIS — E785 Hyperlipidemia, unspecified: Secondary | ICD-10-CM

## 2009-07-26 HISTORY — DX: Hyperlipidemia, unspecified: E78.5

## 2009-07-26 LAB — CONVERTED CEMR LAB: Rhuematoid fact SerPl-aCnc: <20 intl units/mL (ref 0–20)

## 2009-07-27 ENCOUNTER — Telehealth: Payer: Self-pay | Admitting: Internal Medicine

## 2009-07-27 LAB — CONVERTED CEMR LAB
Albumin: 4.2 g/dL (ref 3.5–5.2)
Alkaline Phosphatase: 92 units/L (ref 39–117)
Basophils Relative: 0.7 % (ref 0.0–3.0)
CO2: 30 meq/L (ref 19–32)
Chloride: 104 meq/L (ref 96–112)
Eosinophils Absolute: 0.1 10*3/uL (ref 0.0–0.7)
Hemoglobin: 12 g/dL (ref 12.0–15.0)
Hgb A1c MFr Bld: 6.3 % (ref 4.6–6.5)
MCHC: 33.2 g/dL (ref 30.0–36.0)
MCV: 89 fL (ref 78.0–100.0)
Monocytes Absolute: 0.6 10*3/uL (ref 0.1–1.0)
Neutro Abs: 4.1 10*3/uL (ref 1.4–7.7)
Nitrite: NEGATIVE
RBC: 4.07 M/uL (ref 3.87–5.11)
Sodium: 142 meq/L (ref 135–145)
Specific Gravity, Urine: 1.02 (ref 1.000–1.030)
TSH: 0.66 microintl units/mL (ref 0.35–5.50)
Total Protein: 7.4 g/dL (ref 6.0–8.3)
Urine Glucose: NEGATIVE mg/dL
Urobilinogen, UA: 0.2 (ref 0.0–1.0)
VLDL: 25.2 mg/dL (ref 0.0–40.0)

## 2009-08-02 ENCOUNTER — Ambulatory Visit: Payer: Self-pay | Admitting: Gastroenterology

## 2009-08-02 LAB — HM COLONOSCOPY

## 2009-08-04 ENCOUNTER — Encounter: Payer: Self-pay | Admitting: Gastroenterology

## 2009-08-19 ENCOUNTER — Telehealth: Payer: Self-pay | Admitting: Internal Medicine

## 2009-08-26 ENCOUNTER — Ambulatory Visit: Payer: Self-pay | Admitting: Oncology

## 2009-08-30 ENCOUNTER — Encounter: Payer: Self-pay | Admitting: Internal Medicine

## 2009-10-19 ENCOUNTER — Ambulatory Visit: Payer: Self-pay | Admitting: Internal Medicine

## 2009-10-20 ENCOUNTER — Telehealth: Payer: Self-pay | Admitting: Internal Medicine

## 2009-10-20 LAB — CONVERTED CEMR LAB
ALT: 19 units/L (ref 0–35)
AST: 25 units/L (ref 0–37)
Albumin: 3.9 g/dL (ref 3.5–5.2)
Alkaline Phosphatase: 103 units/L (ref 39–117)
HDL: 52 mg/dL (ref >39.00)
TSH: 0.92 microintl units/mL (ref 0.35–5.50)
Total Protein: 6.9 g/dL (ref 6.0–8.3)

## 2009-10-24 ENCOUNTER — Ambulatory Visit: Payer: Self-pay | Admitting: Internal Medicine

## 2009-11-07 ENCOUNTER — Ambulatory Visit: Payer: Self-pay | Admitting: Internal Medicine

## 2009-11-07 ENCOUNTER — Telehealth: Payer: Self-pay | Admitting: Internal Medicine

## 2009-11-07 DIAGNOSIS — R1011 Right upper quadrant pain: Secondary | ICD-10-CM

## 2009-11-07 HISTORY — DX: Right upper quadrant pain: R10.11

## 2009-11-08 LAB — CONVERTED CEMR LAB
ALT: 17 units/L (ref 0–35)
Albumin: 3.9 g/dL (ref 3.5–5.2)
Alkaline Phosphatase: 102 units/L (ref 39–117)
Basophils Relative: 0.6 % (ref 0.0–3.0)
Bilirubin Urine: NEGATIVE
CO2: 31 meq/L (ref 19–32)
Chloride: 98 meq/L (ref 96–112)
Eosinophils Absolute: 0.2 10*3/uL (ref 0.0–0.7)
Eosinophils Relative: 2.9 % (ref 0.0–5.0)
HCT: 35.4 % — ABNORMAL LOW (ref 36.0–46.0)
Hemoglobin: 11.7 g/dL — ABNORMAL LOW (ref 12.0–15.0)
MCHC: 33.1 g/dL (ref 30.0–36.0)
MCV: 88.5 fL (ref 78.0–100.0)
Monocytes Absolute: 0.6 10*3/uL (ref 0.1–1.0)
Neutro Abs: 4.3 10*3/uL (ref 1.4–7.7)
Potassium: 3.3 meq/L — ABNORMAL LOW (ref 3.5–5.1)
RBC: 4 M/uL (ref 3.87–5.11)
Sodium: 143 meq/L (ref 135–145)
Total Protein, Urine: NEGATIVE mg/dL
Total Protein: 6.9 g/dL (ref 6.0–8.3)
Urine Glucose: NEGATIVE mg/dL
Urobilinogen, UA: 0.2 (ref 0.0–1.0)
WBC: 7.2 10*3/uL (ref 4.5–10.5)

## 2009-11-10 ENCOUNTER — Encounter: Admission: RE | Admit: 2009-11-10 | Discharge: 2009-11-10 | Payer: Self-pay | Admitting: Internal Medicine

## 2009-11-21 ENCOUNTER — Telehealth: Payer: Self-pay | Admitting: Gastroenterology

## 2009-12-27 ENCOUNTER — Encounter
Admission: RE | Admit: 2009-12-27 | Discharge: 2009-12-27 | Payer: Self-pay | Source: Home / Self Care | Attending: Oncology | Admitting: Oncology

## 2010-02-07 NOTE — Letter (Signed)
Summary: [facility] Instructions  [name]  [address] [place], [place] [address]   Phone: [phone]  Fax: [phone]       Deanna Salazar    03/30/1940    MRN: [id number]        Procedure Day Dorna Bloom:  Deanna Salazar  08/02/09     Arrival Time:  3:00pm     Procedure Time: 4:00pm     Location of Procedure:                    [name]  [facility] (4th Floor)                        PREPARATION FOR COLONOSCOPY WITH MOVIPREP   Starting 5 days prior to your procedure  THURSDAY 07/21  do not eat nuts, seeds, popcorn, corn, beans, peas,  salads, or any raw vegetables.  Do not take any fiber supplements (e.g. Metamucil, Citrucel, and Benefiber).  THE DAY BEFORE YOUR PROCEDURE         DATE:  MONDAY  07/25  1.  Drink clear liquids the entire day-NO SOLID FOOD  2.  Do not drink anything colored red or purple.  Avoid juices with pulp.  No orange juice.  3.  Drink at least 64 oz. (8 glasses) of fluid/clear liquids during the day to prevent dehydration and help the prep work efficiently.  CLEAR LIQUIDS INCLUDE: Water Jello Ice Popsicles Tea (sugar ok, no milk/cream) Powdered fruit flavored drinks Coffee (sugar ok, no milk/cream) Gatorade Juice: apple, white grape, white cranberry  Lemonade Clear bullion, consomm, broth Carbonated beverages (any kind) Strained chicken noodle soup Hard Candy                             4.  In the morning, mix first dose of MoviPrep solution:    Empty 1 Pouch A and 1 Pouch B into the disposable container    Add lukewarm drinking water to the top line of the container. Mix to dissolve    Refrigerate (mixed solution should be used within 24 hrs)  5.  Begin drinking the prep at 5:00 p.m. The MoviPrep container is divided by 4 marks.   Every 15 minutes drink the solution down to the next mark (approximately 8 oz) until the full liter is complete.   6.  Follow completed prep with 16 oz of clear liquid of your choice  (Nothing red or purple).  Continue to drink clear liquids until bedtime.  7.  Before going to bed, mix second dose of MoviPrep solution:    Empty 1 Pouch A and 1 Pouch B into the disposable container    Add lukewarm drinking water to the top line of the container. Mix to dissolve    Refrigerate  THE DAY OF YOUR PROCEDURE      DATE:  TUESDAY  07/26  Beginning at  11:00 a.m. (5 hours before procedure):         1. Every 15 minutes, drink the solution down to the next mark (approx 8 oz) until the full liter is complete.  2. Follow completed prep with 16 oz. of clear liquid of your choice.    3. You may drink clear liquids until  2:00pm  (2 HOURS BEFORE PROCEDURE).   MEDICATION INSTRUCTIONS  Unless otherwise instructed, you should take regular prescription medications with a small sip of water  as early as possible the morning of your procedure.         OTHER INSTRUCTIONS  You will need a responsible adult at least 71 years of age to accompany you and drive you home.   This person must remain in the waiting room during your procedure.  Wear loose fitting clothing that is easily removed.  Leave jewelry and other valuables at home.  However, you may wish to bring a book to read or  an iPod/MP3 player to listen to music as you wait for your procedure to start.  Remove all body piercing jewelry and leave at home.  Total time from sign-in until discharge is approximately 2-3 hours.  You should go home directly after your procedure and rest.  You can resume normal activities the  day after your procedure.  The day of your procedure you should not:   Drive   Make legal decisions   Operate machinery   Drink alcohol   Return to work  You will receive specific instructions about eating, activities and medications before you leave.    The above instructions have been reviewed and explained to me by   Ulis Rias RN  July 26, 2009 3:45 PM     I fully understand  and can verbalize these instructions _____________________________ Date _________

## 2010-02-07 NOTE — Procedures (Signed)
Summary: Esophageal Manometry   Esophageal Manometry  Procedure date:  04/13/2009  Findings:      normal:     NAME:  Deanna Salazar, Deanna Salazar               ACCOUNT NO.:  [id number]      MEDICAL RECORD NO.:  [id number]          PATIENT TYPE:  AMB      LOCATION:  ENDO                         FACILITY:  [facility]      PHYSICIAN:  Giani Betzold T. Russella Dar, MD, FACGDATE OF BIRTH:  [birth year]      DATE OF CONSULTATION:  04/05/2009   DATE OF DISCHARGE:  04/05/2009                                    CONSULTATION      INDICATIONS:  Chest pain and dysphagia.      The patient tolerated the procedure well.      Upper esophageal sphincter study revealed a resting pressure of 102 mmHg   with a residual pressure of 6.9 mm and 76% relaxation.  Normal.      Lower esophageal body had 10/10 normal peristaltic swallows.  The   pressures were normal.  The duration was slightly above normal in two of   the channels the pressures were normal.      Lower esophageal sphincter study resting pressure 40 3.6 mmHg, residual   pressure 6.3 mmHg, and 86% relaxation with a duration of 12.8 seconds,   normal.      IMPRESSION:  Normal esophageal manometry with a slightly prolonged   peristaltic wave in the esophageal body likely clinically insignificant.      RECOMMENDATIONS:  We will communicate the results of the study by phone   to the patient.               Venita Lick. Russella Dar, MD, [facility]            MTS/MEDQ  D:  04/12/2009  T:  04/13/2009  Job:  [id number]      Electronically Signed by Claudette Head MD FACG on 04/15/2009 10:13:00 AM

## 2010-02-07 NOTE — Progress Notes (Signed)
Summary: Lovastain  Phone Note Call from Patient   Summary of Call: pt informed of recent labs and we are sending in new rx for Lovastatin.  please advise strength, sig, quantity? Initial call taken by: Lanier Prude, [facility]),  July 27, 2009 11:18 AM  Follow-up for Phone Call        OK 20 mg Labs prior to next ov Follow-up by: Tresa Garter MD,  July 27, 2009 10:50 PM  Additional Follow-up for Phone Call Additional follow up Details #1::        Labs? LFT & lipid? Additional Follow-up by: Lamar Sprinkles, CMA,  July 28, 2009 11:29 AM    Additional Follow-up for Phone Call Additional follow up Details #2::    yes 272.0 Thx Follow-up by: Tresa Garter MD,  July 29, 2009 7:34 AM  Additional Follow-up for Phone Call Additional follow up Details #3:: Details for Additional Follow-up Action Taken: Called pt no ansew LMOM (cell #) rx already sent to [facility]. aslo md want her to have labs drawn prior to appt schedule for 10/26/09. will put order in IDX Additional Follow-up by: Orlan Leavens,  July 29, 2009 10:10 AM  New/Updated Medications: LOVASTATIN 20 MG TABS (LOVASTATIN) 1 by mouth once daily for cholesterol Prescriptions: LOVASTATIN 20 MG TABS (LOVASTATIN) 1 by mouth once daily for cholesterol  #30 x 12   Entered by:   Lamar Sprinkles, CMA   Authorized by:   Tresa Garter MD   Signed by:   Lamar Sprinkles, CMA on 07/28/2009   Method used:   Electronically to        [facility] [phone]* (retail)       [address]       [place], [place]  [address]       Ph: [phone]       Fax: [phone]   RxID:   [id number]

## 2010-02-07 NOTE — Progress Notes (Signed)
  Phone Note Call from Patient Call back at [facility] Phone [phone]   Summary of Call: Pt c/o "bad" nonproductive cough and chest tightness. No fever. Patient is requesting advice from MD.  Initial call taken by: Lamar Sprinkles, CMA,  May 03, 2009 3:02 PM  Follow-up for Phone Call        Use over-the-counter medicines for "cold": Tylenol  650mg  or Advil 400mg  every 6 hours  for fever; Delsym or Robutussin for cough. Mucinex or Mucinex D for congestion. Ricola or Halls for sore throat. Office visit if not better or if worse.  Follow-up by: Tresa Garter MD,  May 03, 2009 5:38 PM  Additional Follow-up for Phone Call Additional follow up Details #1::        Informed pt and she states she will call if symptoms no better. Additional Follow-up by: Ami Bullins CMA,  May 04, 2009 9:07 AM

## 2010-02-07 NOTE — Assessment & Plan Note (Signed)
Summary: TERRIBLE PAIN-R SHOULDER TO R LEG PAIN--PER STACY 930 SLOT-TE...   Vital Signs:  Patient profile:   70 year old female Height:      64 inches Weight:      205 pounds BMI:     35.32 Temp:     99.2 degrees F oral Pulse rate:   68 / minute Pulse rhythm:   regular Resp:     16 per minute BP sitting:   140 / 70  (left arm) Cuff size:   regular  Vitals Entered By: Lanier Prude, CMA(AAMA) (November 07, 2009 11:46 AM) CC: pain  rt side under breast radiating to back and down to hip X 1 wk   Primary Care Provider:  Jacinta Shoe, MD   CC:  pain  rt side under breast radiating to back and down to hip X 1 wk.  History of Present Illness: C/o R chest and RUQ severe burning pain since last week. No injury. No URI symptoms  No N/V  Current Medications (verified): 1)  Norvasc 5 Mg  Tabs (Amlodipine Besylate) .... Once Daily 2)  Synthroid 50 Mcg  Tabs (Levothyroxine Sodium) .... Once Daily 3)  Methylprednisolone 4 Mg  Tabs (Methylprednisolone) .... Once Daily 4)  Tylenol 325 Mg  Tabs (Acetaminophen) .... As Needed 5)  Vitamin D3 1000 Unit  Tabs (Cholecalciferol) .Marland Kitchen.. 1 By Mouth Daily 6)  Anastrozole 1 Mg Tabs (Anastrozole) .... One Tablet By Mouth Once Daily 7)  Omeprazole 40 Mg Cpdr (Omeprazole) .... One Tablet By Mouth Once Daily 8)  Lovastatin 20 Mg Tabs (Lovastatin) .Marland Kitchen.. 1 By Mouth Once Daily For Cholesterol 9)  Cyclobenzaprine Hcl 10 Mg Tabs (Cyclobenzaprine Hcl) .... 1/2-1 Tab By Mouth Two Times A Day As Needed Muscle Spasms  Allergies (verified): 1)  Hydrocodone 2)  Codeine 3)  Tramadol Hcl  Past History:  Past Medical History: Last updated: 10/19/2009 GERD Dr Russella Dar Rheumatoid arthritis Dr Mardene Sayer Hypothyroidism Hypertension Insomnia Breast cancer L, hx of  XRT Dr Truett Perna Adenomatous Colon Polyps, 04/2000 Hemorrhoids Irritable Bowel Syndrome Fibromyalgia Fatty Liver Disease Depression Hyperlipidemia Keloids  Social History: Last updated:  10/02/2007 Retired Never Smoked Alcohol use-no Illicit Drug Use - no Patient does not get regular exercise.   Review of Systems       The patient complains of fever and abdominal pain.  The patient denies weight loss, dyspnea on exertion, prolonged cough, headaches, melena, and hematochezia.         No UTI symptoms   Physical Exam  General:  Looks tired, NAD and overweight-appearing.   Head:  Normocephalic and atraumatic without obvious abnormalities. No apparent alopecia or balding. Nose:  External nasal examination shows no deformity or inflammation. Nasal mucosa are pink and moist without lesions or exudates. Mouth:  no pharyngeal erythema and postnasal drip.   Neck:  supple, full ROM, no masses, no thyromegaly, no JVD, normal carotid upstroke, and no cervical lymphadenopathy.   Lungs:  Normal respiratory effort, chest expands symmetrically. Lungs are clear to auscultation, no crackles or wheezes. Heart:  Normal rate and regular rhythm. S1 and S2 normal without gallop, murmur, click, rub or other extra sounds. Abdomen:  Bowel sounds positive,abdomen soft and tender  some in RUQ without masses, organomegaly or hernias noted. Msk:  No deformity or scoliosis noted of thoracic or lumbar spine.   Neurologic:  No cranial nerve deficits noted. Station and gait are normal. Plantar reflexes are down-going bilaterally. DTRs are symmetrical throughout. Sensory, motor and coordinative functions appear intact.  Skin:  Intact without suspicious lesions or rashes Psych:  Cognition and judgment appear intact. Alert and cooperative with normal attention span and concentration. No apparent delusions, illusions, hallucinations   Impression & Recommendations:  Problem # 1:  RUQ PAIN (ICD-789.01) - GS vs pyelo vs Zoster Assessment New Toradol 30 mg Empiric Cipro Orders: Radiology Referral (Radiology) [place] abd TLB-BMP (Basic Metabolic Panel-BMET) (80048-METABOL) TLB-CBC Platelet - w/Differential  (85025-CBCD) TLB-Hepatic/Liver Function Pnl (80076-HEPATIC) TLB-Lipase (83690-LIPASE) TLB-Udip ONLY (81003-UDIP)  Problem # 2:  FEVER, NOS (ICD-780.60) related to #1 Assessment: New  Orders: TLB-BMP (Basic Metabolic Panel-BMET) (80048-METABOL) TLB-CBC Platelet - w/Differential (85025-CBCD) TLB-Hepatic/Liver Function Pnl (80076-HEPATIC) TLB-Lipase (83690-LIPASE) TLB-Udip ONLY (81003-UDIP) Radiology Referral (Radiology)  Problem # 3:  HYPOKALEMIA (ICD-276.8) Assessment: New Start Rx  Complete Medication List: 1)  Norvasc 5 Mg Tabs (Amlodipine besylate) .... Once daily 2)  Synthroid 50 Mcg Tabs (Levothyroxine sodium) .... Once daily 3)  Methylprednisolone 4 Mg Tabs (Methylprednisolone) .... Once daily 4)  Tylenol 325 Mg Tabs (Acetaminophen) .... As needed 5)  Vitamin D3 1000 Unit Tabs (Cholecalciferol) .Marland Kitchen.. 1 by mouth daily 6)  Anastrozole 1 Mg Tabs (Anastrozole) .... One tablet by mouth once daily 7)  Omeprazole 40 Mg Cpdr (Omeprazole) .... One tablet by mouth once daily 8)  Lovastatin 20 Mg Tabs (Lovastatin) .Marland Kitchen.. 1 by mouth once daily for cholesterol 9)  Cyclobenzaprine Hcl 10 Mg Tabs (Cyclobenzaprine hcl) .... 1/2-1 tab by mouth two times a day as needed muscle spasms 10)  Ciprofloxacin Hcl 500 Mg Tabs (Ciprofloxacin hcl) .Marland Kitchen.. 1 by mouth bid 11)  Ibuprofen 600 Mg Tabs (Ibuprofen) .Marland Kitchen.. 1 by mouth bid  pc  as needed for  pain 12)  Oxycodone-acetaminophen 5-325 Mg Tabs (Oxycodone-acetaminophen) .Marland Kitchen.. 1 by mouth qid as needed pain 13)  Klor-con M10 10 Meq Cr-tabs (Potassium chloride crys cr) .Marland Kitchen.. 1 by mouth qd  Patient Instructions: 1)  RTC 3 days 2)  Call if you are not better in a reasonable amount of time or if worse. Go to ER if feeling really bad!  3)  Low fat diet  Prescriptions: KLOR-CON M10 10 MEQ CR-TABS (POTASSIUM CHLORIDE CRYS CR) 1 by mouth qd  #30 x 6   Entered and Authorized by:   Tresa Garter MD   Signed by:   Tresa Garter MD on 11/07/2009   Method  used:   Print then Give to Patient   RxID:   [id number] OXYCODONE-ACETAMINOPHEN 5-325 MG TABS (OXYCODONE-ACETAMINOPHEN) 1 by mouth qid as needed pain  #40 x 0   Entered and Authorized by:   Tresa Garter MD   Signed by:   Tresa Garter MD on 11/07/2009   Method used:   Print then Give to Patient   RxID:   [id number] IBUPROFEN 600 MG TABS (IBUPROFEN) 1 by mouth bid  pc  as needed for  pain  #60 x 1   Entered and Authorized by:   Tresa Garter MD   Signed by:   Tresa Garter MD on 11/07/2009   Method used:   Electronically to        [facility] [phone]* (retail)       [address]       [place], [place]  [address]       Ph: [phone]       Fax: [phone]   RxID:   [id number] CIPROFLOXACIN HCL 500 MG TABS (CIPROFLOXACIN HCL) 1 by mouth bid  #20 x 0   Entered  and Authorized by:   Tresa Garter MD   Signed by:   Tresa Garter MD on 11/07/2009   Method used:   Electronically to        [facility] [phone]* (retail)       [address]       [place], [place]  [address]       Ph: [phone]       Fax: [phone]   RxID:   [id number]    Orders Added: 1)  Est. Patient Level IV [44010] 2)  Radiology Referral [Radiology] 3)  TLB-BMP (Basic Metabolic Panel-BMET) [80048-METABOL] 4)  TLB-CBC Platelet - w/Differential [85025-CBCD] 5)  TLB-Hepatic/Liver Function Pnl [80076-HEPATIC] 6)  TLB-Lipase [83690-LIPASE] 7)  TLB-Udip ONLY [81003-UDIP]  Appended Document: TERRIBLE PAIN-R SHOULDER TO R LEG PAIN--PER STACY 930 SLOT-TE...     Clinical Lists Changes  Orders: Added new Service order of Ketorolac-Toradol 15mg  [phone]) - Signed Added new Service order of Admin of Therapeutic Inj  intramuscular or subcutaneous (66440) - Signed       Medication Administration  Injection # 1:    Medication: Ketorolac-Toradol 15mg     Diagnosis: HIP PAIN (ICD-719.45)    Route: IM    Site: RUOQ gluteus    Exp Date:  10/09/2010    Lot #: [id number]    Mfr: Novartis    Comments: 30mg     Patient tolerated injection without complications    Given by: Lamar Sprinkles, CMA (November 11, 2009 11:27 AM)  Orders Added: 1)  Ketorolac-Toradol 15mg  [J1885] 2)  Admin of Therapeutic Inj  intramuscular or subcutaneous [56387]

## 2010-02-07 NOTE — Progress Notes (Signed)
Summary: Cholesterol med  Phone Note Call from Patient   Caller: Patient Summary of Call: pt wants to know if she should cont cholesterol med? Initial call taken by: Lanier Prude, [facility]),  October 20, 2009 9:17 AM  Follow-up for Phone Call        Yes please cont. Follow-up by: Tresa Garter MD,  October 20, 2009 12:35 PM  Additional Follow-up for Phone Call Additional follow up Details #1::        Pt informed  Additional Follow-up by: Lamar Sprinkles, CMA,  October 20, 2009 4:23 PM

## 2010-02-07 NOTE — Assessment & Plan Note (Signed)
Summary: f/u appt per pt/#/cd   Vital Signs:  Patient profile:   70 year old female Weight:      210 pounds Temp:     98.9 degrees F oral Pulse rate:   71 / minute BP sitting:   120 / 80  (left arm)  Vitals Entered By: Tora Perches (March 03, 2009 9:08 AM) CC: f/u Is Patient Diabetic? No   Primary Care Provider:  Jacinta Shoe MD  CC:  f/u.  History of Present Illness: C/o fatigue, aches, GERD, RA  Preventive Screening-Counseling & Management  Alcohol-Tobacco     Smoking Status: never  Current Medications (verified): 1)  Norvasc 5 Mg  Tabs (Amlodipine Besylate) .... Once Daily 2)  Synthroid 50 Mcg  Tabs (Levothyroxine Sodium) .... Once Daily 3)  Methylprednisolone 4 Mg  Tabs (Methylprednisolone) .... Once Daily 4)  Tylenol 325 Mg  Tabs (Acetaminophen) .... As Needed 5)  Vitamin D3 1000 Unit  Tabs (Cholecalciferol) .Marland Kitchen.. 1 By Mouth Daily 6)  Arimidex 1 Mg  Tabs (Anastrozole) .... Once Daily 7)  Omeprazole 40 Mg Cpdr (Omeprazole) .... One Tablet By Mouth Once Daily 8)  Avelox Abc Pack 400 Mg Tabs (Moxifloxacin Hcl) .... Once Daily For 5 Days 9)  Promethazine-Dm 6.25-15 Mg/32ml Syrp (Promethazine-Dm) .... 5-10 Ml By Mouth Qid As Needed For Cough  Allergies: 1)  Hydrocodone 2)  Codeine  Past History:  Past Surgical History: Last updated: 10/02/2007 Thyroidectomy left Lumpectomy L 2009 OS Cataract Extraction 2009  Social History: Last updated: 10/02/2007 Retired Never Smoked Alcohol use-no Illicit Drug Use - no Patient does not get regular exercise.   Past Medical History: GERD Dr Russella Dar Rheumatoid arthritis Dr Mardene Sayer Hypothyroidism Hypertension Insomnia Breast cancer L, hx of  XRT Dr Truett Perna Adenomatous Colon Polyps, 04/2000 Hemorrhoids Irritable Bowel Syndrome Fibromyalgia Fatty Liver Disease Depression  Review of Systems       The patient complains of hoarseness, abdominal pain, and severe indigestion/heartburn.  The patient denies  anorexia, fever, weight loss, weight gain, melena, and hematochezia.    Physical Exam  General:  alert, well-developed, well-nourished, well-hydrated, and overweight-appearing.   Nose:  External nasal examination shows no deformity or inflammation. Nasal mucosa are pink and moist without lesions or exudates. Mouth:  no pharyngeal erythema and postnasal drip.   Lungs:  Normal respiratory effort, chest expands symmetrically. Lungs are clear to auscultation, no crackles or wheezes. Heart:  Normal rate and regular rhythm. S1 and S2 normal without gallop, murmur, click, rub or other extra sounds. Abdomen:  Bowel sounds positive,abdomen soft and non-tender without masses, organomegaly or hernias noted. Msk:  No deformity or scoliosis noted of thoracic or lumbar spine.   Neurologic:  No cranial nerve deficits noted. Station and gait are normal. Plantar reflexes are down-going bilaterally. DTRs are symmetrical throughout. Sensory, motor and coordinative functions appear intact. Skin:  Intact without suspicious lesions or rashes Psych:  Cognition and judgment appear intact. Alert and cooperative with normal attention span and concentration. No apparent delusions, illusions, hallucinations   Impression & Recommendations:  Problem # 1:  BACK PAIN (ICD-724.5) Assessment Unchanged  Her updated medication list for this problem includes:    Tylenol 325 Mg Tabs (Acetaminophen) .Marland Kitchen... As needed  Orders: TLB-B12, Serum-Total ONLY ([address]109-U04) TLB-BMP (Basic Metabolic Panel-BMET) (80048-METABOL) TLB-CBC Platelet - w/Differential (85025-CBCD) TLB-Hepatic/Liver Function Pnl (80076-HEPATIC) TLB-TSH (Thyroid Stimulating Hormone) (84443-TSH) TLB-Udip ONLY (81003-UDIP) TLB-CRP-High Sensitivity (C-Reactive Protein) (86140-FCRP) TLB-Sedimentation Rate (ESR) (85652-ESR)  Problem # 2:  FATIGUE (ICD-780.79) Assessment: Unchanged  Orders: TLB-B12,  Serum-Total ONLY (14782-N56) TLB-BMP (Basic Metabolic  Panel-BMET) (80048-METABOL) TLB-CBC Platelet - w/Differential (85025-CBCD) TLB-Hepatic/Liver Function Pnl (80076-HEPATIC) TLB-TSH (Thyroid Stimulating Hormone) (84443-TSH) TLB-Udip ONLY (81003-UDIP) TLB-CRP-High Sensitivity (C-Reactive Protein) (86140-FCRP) TLB-Sedimentation Rate (ESR) (85652-ESR)  Problem # 3:  DEPRESSION (ICD-311) Assessment: Unchanged  Orders: TLB-B12, Serum-Total ONLY (21308-M57) TLB-BMP (Basic Metabolic Panel-BMET) (80048-METABOL) TLB-CBC Platelet - w/Differential (85025-CBCD) TLB-Hepatic/Liver Function Pnl (80076-HEPATIC) TLB-TSH (Thyroid Stimulating Hormone) (84443-TSH) TLB-Udip ONLY (81003-UDIP) TLB-CRP-High Sensitivity (C-Reactive Protein) (86140-FCRP) TLB-Sedimentation Rate (ESR) (85652-ESR)  Problem # 4:  HYPERTENSION (ICD-401.9) Assessment: Unchanged  Her updated medication list for this problem includes:    Norvasc 5 Mg Tabs (Amlodipine besylate) ..... Once daily  Orders: TLB-B12, Serum-Total ONLY (84696-E95) TLB-BMP (Basic Metabolic Panel-BMET) (80048-METABOL) TLB-CBC Platelet - w/Differential (85025-CBCD) TLB-Hepatic/Liver Function Pnl (80076-HEPATIC) TLB-TSH (Thyroid Stimulating Hormone) (84443-TSH) TLB-Udip ONLY (81003-UDIP) TLB-CRP-High Sensitivity (C-Reactive Protein) (86140-FCRP) TLB-Sedimentation Rate (ESR) (85652-ESR)  Problem # 5:  FIBROMYALGIA (ICD-729.1)  Her updated medication list for this problem includes:    Tylenol 325 Mg Tabs (Acetaminophen) .Marland Kitchen... As needed  Complete Medication List: 1)  Norvasc 5 Mg Tabs (Amlodipine besylate) .... Once daily 2)  Synthroid 50 Mcg Tabs (Levothyroxine sodium) .... Once daily 3)  Methylprednisolone 4 Mg Tabs (Methylprednisolone) .... Once daily 4)  Tylenol 325 Mg Tabs (Acetaminophen) .... As needed 5)  Vitamin D3 1000 Unit Tabs (Cholecalciferol) .Marland Kitchen.. 1 by mouth daily 6)  Arimidex 1 Mg Tabs (Anastrozole) .... Once daily 7)  Omeprazole 40 Mg Cpdr (Omeprazole) .... One tablet by mouth once  daily 8)  Promethazine-dm 6.25-15 Mg/70ml Syrp (Promethazine-dm) .... 5-10 ml by mouth qid as needed for cough  Other Orders: Flu Vaccine 10yrs + (28413) Administration Flu vaccine - MCR (K4401)  Patient Instructions: 1)  Please schedule a follow-up appointment in 6 months. 2)  Use stretching exercises that I have provided (15 min. or longer every day) 3)  Try to eat more raw plant food, fresh and dry fruit, raw almonds, leafy vegetables, whole foods and less red meat, less animal fat. Poultry and fish is better for you than pork and beef. Avoid processed foods (canned soups, hot dogs, sausage, bacon , frozen dinners). Avoid corn syrup, high fructose syrup or aspartam   containing drinks. Honey, Agave and Stevia are better sweeteners. Make your own  dressing with olive oil, wine vinegar, lemon juce, garlic etc. for your salads.    Influenza Vaccine (to be given today)  Flu Vaccine Consent Questions     Do you have a history of severe allergic reactions to this vaccine? no    Any prior history of allergic reactions to egg and/or gelatin? no    Do you have a sensitivity to the preservative Thimersol? no    Do you have a past history of Guillan-Barre Syndrome? no    Do you currently have an acute febrile illness? no    Have you ever had a severe reaction to latex? no    Vaccine information given and explained to patient? yes    Are you currently pregnant? no    Lot Number:[id number]   Exp Date:07/07/2009   Site Given  Left Deltoid IMaccine (to be given today)      .lbmedflu

## 2010-02-07 NOTE — Progress Notes (Signed)
Summary: Merilax not helping   Phone Note Call from Patient Call back at [phone]   Caller: Patient Call For: Dr. Russella Dar Reason for Call: Talk to Nurse Summary of Call: Mirilax is not helping anymore and pt would like to be recommended something else Initial call taken by: Vallarie Mare,  March 17, 2009 12:03 PM  Follow-up for Phone Call        Patient  feels that Miralax only causes her to have bloating, and she has no results from Miralax.  It causes excess bloating and gas with very minimal results.  Patient  has a follow up appointment with Dr Russella Dar already scheduled for next week, but is uncomfortable and requesting advise in the meantime.  Patient  hasn't tries other OTC products.  She is advised she can try MOM or dulcolax.  She is asked to keep her appointment for Dr Russella Dar next week. Follow-up by: Darcey Nora RN, CGRN,  March 17, 2009 3:48 PM

## 2010-02-07 NOTE — Progress Notes (Signed)
Summary: Triage   Phone Note Call from Patient Call back at Home Phone [phone]   Caller: Patient Call For: Dr. Russella Dar Reason for Call: Talk to Nurse Summary of Call: Pt. wants to know if she should stop all meds before her manometry test at [facility] Initial call taken by: Karna Christmas,  April 01, 2009 3:30 PM  Follow-up for Phone Call        Patient  advised to only stop her omeprazole for [facility] next week. Follow-up by: Darcey Nora RN, CGRN,  April 01, 2009 3:59 PM

## 2010-02-07 NOTE — Progress Notes (Signed)
Summary: PAIN   Phone Note Call from Patient Call back at Home Phone [phone]   Summary of Call: Patient is requesting rx for pain med or muscle relaxor for hip. Last med caused itching and did not help rellieve pain.  Initial call taken by: Lamar Sprinkles, CMA,  August 19, 2009 12:00 PM  Follow-up for Phone Call        OK flexeril to try Follow-up by: Tresa Garter MD,  August 19, 2009 12:59 PM  Additional Follow-up for Phone Call Additional follow up Details #1::        Pt informed  Additional Follow-up by: Lamar Sprinkles, CMA,  August 19, 2009 3:12 PM   New Allergies: TRAMADOL HCL New/Updated Medications: CYCLOBENZAPRINE HCL 10 MG TABS (CYCLOBENZAPRINE HCL) 1/2-1 tab by mouth two times a day as needed muscle spasms New Allergies: TRAMADOL HCLPrescriptions: CYCLOBENZAPRINE HCL 10 MG TABS (CYCLOBENZAPRINE HCL) 1/2-1 tab by mouth two times a day as needed muscle spasms  #30 x 1   Entered and Authorized by:   Tresa Garter MD   Signed by:   Lamar Sprinkles, CMA on 08/19/2009   Method used:   Electronically to        [facility] [address] [phone]* (retail)       [address]       [place], [place]  [address]       Ph: [phone]       Fax: [phone]   RxID:   [id number]

## 2010-02-07 NOTE — Progress Notes (Signed)
Summary: Lab results  Phone Note Call from Patient   Caller: [phone] Summary of Call: Patient is requesting a call with results of last labs.  Initial call taken by: Lamar Sprinkles, CMA,  March 21, 2009 12:18 PM  Follow-up for Phone Call        would defer to AVP as he reviewed the labs- thanks Follow-up by: Newt Lukes MD,  March 21, 2009 1:13 PM  Additional Follow-up for Phone Call Additional follow up Details #1::        Pt informed dr is out of the office and this will wait until he returns. Pt made f/u for f/u in july.  Additional Follow-up by: Lamar Sprinkles, CMA,  March 21, 2009 2:56 PM    Additional Follow-up for Phone Call Additional follow up Details #2::    Abn labs are present; the abnormalities are mild and are  due to RA. Drink more water - sodium was high. Follow-up by: Tresa Garter MD,  March 21, 2009 8:41 PM  Additional Follow-up for Phone Call Additional follow up Details #3:: Details for Additional Follow-up Action Taken: Pt informed  Additional Follow-up by: Lamar Sprinkles, CMA,  March 22, 2009 3:01 PM

## 2010-02-07 NOTE — Letter (Signed)
Summary: Patient Notice- Polyp Results  [facility]  [address] [place], [place] [address]   Phone: [phone]  Fax: [phone]        August 04, 2009 MRN: [id number]    Deanna Salazar [address] [place], [place]  [address]    Dear Ms. Depinto,  I am pleased to inform you that the colon polyp(s) removed during your recent colonoscopy was (were) found to be benign (no cancer detected) upon pathologic examination.  I recommend you have a repeat colonoscopy examination in 3 years to look for recurrent polyps, as having colon polyps increases your risk for having recurrent polyps or even colon cancer in the future.  Should you develop new or worsening symptoms of abdominal pain, bowel habit changes or bleeding from the rectum or bowels, please schedule an evaluation with either your primary care physician or with me.  Continue treatment plan as outlined the day of your exam.  Please call us if you are having persistent problems or have questions about your condition that have not been fully answered at this time.  Sincerely,  Deanna Dare MD [facility]  This letter has been electronically signed by your physician.  Appended Document: Patient Notice- Polyp Results letter mailed 8.1.2011

## 2010-02-07 NOTE — Assessment & Plan Note (Signed)
Summary: RS BUMP PHONE STC   Vital Signs:  Patient profile:   70 year old female Height:      64 inches (162.56 cm) Weight:      205 pounds (93.18 kg) BMI:     35.32 O2 Sat:      97 % on Room air Temp:     99.8 degrees F (37.67 degrees C) oral Pulse rate:   77 / minute Pulse rhythm:   regular Resp:     16 per minute BP sitting:   120 / 84  (left arm) Cuff size:   large  Vitals Entered By: Lanier Prude, CMA(AAMA) (July 26, 2009 9:19 AM)  O2 Flow:  Room air CC: 6 mo f/u.  C/O sinus pain/pressure X 2 wk and throat irritation Is Patient Diabetic? No   Primary Care Provider:  Jacinta Shoe, MD   CC:  6 mo f/u.  C/O sinus pain/pressure X 2 wk and throat irritation.  History of Present Illness: She is worried about her liver and kidney, she is worried about UTI C/o hoarsness and ST x 1 month C/o lower abd pain  Current Medications (verified): 1)  Norvasc 5 Mg  Tabs (Amlodipine Besylate) .... Once Daily 2)  Synthroid 50 Mcg  Tabs (Levothyroxine Sodium) .... Once Daily 3)  Methylprednisolone 4 Mg  Tabs (Methylprednisolone) .... Once Daily 4)  Tylenol 325 Mg  Tabs (Acetaminophen) .... As Needed 5)  Vitamin D3 1000 Unit  Tabs (Cholecalciferol) .Marland Kitchen.. 1 By Mouth Daily 6)  Anastrozole 1 Mg Tabs (Anastrozole) .... One Tablet By Mouth Once Daily 7)  Omeprazole 40 Mg Cpdr (Omeprazole) .... 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 Family History Hypertension Family History of Colon Cancer: 2 Brothers ages  41 and 39  Social History: Reviewed history from  10/02/2007 and no changes required. Retired Never Smoked Alcohol use-no Illicit Drug Use - no Patient does not get regular exercise.   Review of Systems  The patient denies weight loss, weight gain, chest pain, syncope, and melena.    Physical Exam  General:  alert, well-developed, well-nourished, well-hydrated, and overweight-appearing.   Ears:  R ear normal and L ear normal.   Nose:  External nasal examination shows no deformity or inflammation. Nasal mucosa are pink and moist without lesions or exudates. Mouth:  no pharyngeal erythema and postnasal drip.   Neck:  supple, full ROM, no masses, no thyromegaly, no JVD, normal carotid upstroke, and no cervical lymphadenopathy.   Lungs:  Normal respiratory effort, chest expands symmetrically. Lungs are clear to auscultation, no crackles or wheezes. Heart:  Normal rate and regular rhythm. S1 and S2 normal without gallop, murmur, click, rub or other extra sounds. Abdomen:  Bowel sounds positive,abdomen soft and non-tender without masses, organomegaly or hernias noted. Msk:  No deformity or scoliosis noted of thoracic or lumbar spine.   Neurologic:  No cranial nerve deficits noted. Station and gait are normal. Plantar reflexes are down-going bilaterally. DTRs are symmetrical throughout. Sensory, motor and coordinative functions appear intact. Skin:  Intact without suspicious lesions or rashes Psych:  Cognition and judgment appear intact. Alert and cooperative with normal attention span and  concentration. No apparent delusions, illusions, hallucinations   Impression & Recommendations:  Problem # 1:  GERD (ICD-530.81) Assessment Deteriorated  Her updated medication list for this problem includes:    Omeprazole 40 Mg Cpdr (Omeprazole) ..... One tablet by mouth once daily Risks of noncompliance with treatment discussed. Compliance encouraged.  ENT cons if not well  Problem # 2:  ST Plan as above  Problem # 3:  FEVER, NOS  (ICD-780.60) Assessment: New  Orders: TLB-BMP (Basic Metabolic Panel-BMET) (80048-METABOL) TLB-CBC Platelet - w/Differential (85025-CBCD) TLB-Hepatic/Liver Function Pnl (80076-HEPATIC) TLB-Sedimentation Rate (ESR) (85652-ESR) TLB-TSH (Thyroid Stimulating Hormone) (84443-TSH) TLB-Udip ONLY (81003-UDIP) T-Vitamin D (25-Hydroxy) ([address]-60[address])  Problem # 4:  HYPERTENSION (ICD-401.9) Assessment: Unchanged  Her updated medication list for this problem includes:    Norvasc 5 Mg Tabs (Amlodipine besylate) ..... Once daily  BP today: 120/84 Prior BP: 128/82 (03/24/2009)  Labs Reviewed: K+: 3.6 (03/03/2009) Creat: : 0.9 (03/03/2009)   Chol: 243 (11/24/2007)   HDL: 53.6 (11/24/2007)   LDL: DEL (11/24/2007)   TG: 87 (11/24/2007)  Orders: TLB-A1C / Hgb A1C (Glycohemoglobin) (83036-A1C) TLB-Lipid Panel (80061-LIPID)  Problem # 5:  DYSURIA (ICD-788.1) Assessment: New  Problem # 6:  BREAST CANCER, HX OF (ICD-V10.3) Assessment: Comment Only On prescription drug  therapy   Problem # 7:  HYPERLIPIDEMIA (ICD-272.4) Assessment: Deteriorated  Treatment adviced  Labs Reviewed: SGOT: 22 (03/03/2009)   SGPT: 20 (03/03/2009)   HDL:53.6 (11/24/2007), 60.6 (07/02/2006)  LDL:DEL (11/24/2007), DEL (07/02/2006)  Chol:243 (11/24/2007), 257 (07/02/2006)  Trig:87 (11/24/2007), 96 (07/02/2006)  Complete Medication List: 1)  Norvasc 5 Mg Tabs (Amlodipine besylate) .... Once daily 2)  Synthroid 50 Mcg Tabs (Levothyroxine sodium) .... Once daily 3)  Methylprednisolone 4 Mg Tabs (Methylprednisolone) .... Once daily 4)  Tylenol 325 Mg Tabs (Acetaminophen) .... As needed 5)  Vitamin D3 1000 Unit Tabs (Cholecalciferol) .Marland Kitchen.. 1 by mouth daily 6)  Anastrozole 1 Mg Tabs (Anastrozole) .... One tablet by mouth once daily 7)  Omeprazole 40 Mg Cpdr (Omeprazole) .... One tablet by mouth once daily 8)  Moviprep 100 Gm Solr (Peg-kcl-nacl-nasulf-na asc-c) .... As per prep instructions.  Patient Instructions: 1)   Please schedule a follow-up appointment in 3 months. 2)  Call if you are not better in a reasonable amount of time or if worse.

## 2010-02-07 NOTE — Progress Notes (Signed)
Summary: rx request   Phone Note Call from Patient Call back at Home Phone [phone]   Caller: Patient Call For: Dr. Russella Dar Reason for Call: Talk to Nurse Summary of Call: constipated and would like a rx for Amitica... [facility] on [address] Initial call taken by: Vallarie Mare,  November 21, 2009 11:51 AM  Follow-up for Phone Call        Pt states she heard from her friends that Amitiza is great medication for constipation and wants to know if we can send the prescription in to her pharmacy. Informed patient we cannot send in a medication we have never prescribed her over the phone and she would have to take the Miralax as we told her to in the past. Start with 6- 8 glasses today and drink a lot of fluids but take Miralax 1-2 daily, every day. Pt states she gets a lot of gas and bloating. Told patient to take Gas-X four times a day as needed or Phazyme which is OTC. Offered a appointment for patient and she states she cannot afford a appt right now. Told her try this for now and call us back to schedule a appointment if the Miralax does not help. Pt agreed. Follow-up by: Christie Nottingham CMA Duncan Dull),  November 21, 2009 12:24 PM

## 2010-02-07 NOTE — Letter (Signed)
Summary: [facility]  [facility]   Imported By: Lennie Odor 09/20/2009 11:00:31  _____________________________________________________________________  External Attachment:    Type:   Image     Comment:   External Document

## 2010-02-07 NOTE — Assessment & Plan Note (Signed)
Summary: 3 MO ROV /NWS   Vital Signs:  Patient profile:   70 year old female Height:      64 inches Weight:      203 pounds BMI:     34.97 Temp:     99.1 degrees F oral Pulse rate:   80 / minute Pulse rhythm:   regular Resp:     16 per minute BP sitting:   150 / 90  (left arm) Cuff size:   regular  Vitals Entered By: Lanier Prude, CMA(AAMA) (October 19, 2009 9:12 AM) CC: 3 mo f/u Is Patient Diabetic? No Comments pt is not taking Cyclobenazaprine   Primary Care Provider:  Jacinta Shoe, MD   CC:  3 mo f/u.  History of Present Illness: The patient presents for a follow up of hypertension, hypothyroid, hyperlipidemia   Current Medications (verified): 1)  Norvasc 5 Mg  Tabs (Amlodipine Besylate) .... Once Daily 2)  Synthroid 50 Mcg  Tabs (Levothyroxine Sodium) .... Once Daily 3)  Methylprednisolone 4 Mg  Tabs (Methylprednisolone) .... Once Daily 4)  Tylenol 325 Mg  Tabs (Acetaminophen) .... As Needed 5)  Vitamin D3 1000 Unit  Tabs (Cholecalciferol) .Marland Kitchen.. 1 By Mouth Daily 6)  Anastrozole 1 Mg Tabs (Anastrozole) .... One Tablet By Mouth Once Daily 7)  Omeprazole 40 Mg Cpdr (Omeprazole) .... One Tablet By Mouth Once Daily 8)  Lovastatin 20 Mg Tabs (Lovastatin) .Marland Kitchen.. 1 By Mouth Once Daily For Cholesterol 9)  Cyclobenzaprine Hcl 10 Mg Tabs (Cyclobenzaprine Hcl) .... 1/2-1 Tab By Mouth Two Times A Day As Needed Muscle Spasms  Allergies (verified): 1)  Hydrocodone 2)  Codeine 3)  Tramadol Hcl  Past History:  Past Surgical History: Last updated: 10/02/2007 Thyroidectomy left Lumpectomy L 2009 OS Cataract Extraction 2009  Social History: Last updated: 10/02/2007 Retired Never Smoked Alcohol use-no Illicit Drug Use - no Patient does not get regular exercise.   Past Medical History: GERD Dr Russella Dar Rheumatoid arthritis Dr Mardene Sayer Hypothyroidism Hypertension Insomnia Breast cancer L, hx of  XRT Dr Truett Perna Adenomatous Colon Polyps,  04/2000 Hemorrhoids Irritable Bowel Syndrome Fibromyalgia Fatty Liver Disease Depression Hyperlipidemia Keloids  Review of Systems  The patient denies fever, prolonged cough, and abdominal pain.    Physical Exam  General:  alert, well-developed, well-nourished, well-hydrated, and overweight-appearing.   Head:  normocephalic, atraumatic, no abnormalities observed, and no abnormalities palpated.   Nose:  External nasal examination shows no deformity or inflammation. Nasal mucosa are pink and moist without lesions or exudates. Mouth:  no pharyngeal erythema and postnasal drip.   Neck:  supple, full ROM, no masses, no thyromegaly, no JVD, normal carotid upstroke, and no cervical lymphadenopathy.   Lungs:  Normal respiratory effort, chest expands symmetrically. Lungs are clear to auscultation, no crackles or wheezes. Heart:  Normal rate and regular rhythm. S1 and S2 normal without gallop, murmur, click, rub or other extra sounds. Abdomen:  Bowel sounds positive,abdomen soft and non-tender without masses, organomegaly or hernias noted. Msk:  No deformity or scoliosis noted of thoracic or lumbar spine.   Pulses:  R and L carotid,radial,femoral,dorsalis pedis and posterior tibial pulses are full and equal bilaterally Extremities:  No clubbing, cyanosis, edema, or deformity noted with normal full range of motion of all joints.   Neurologic:  No cranial nerve deficits noted. Station and gait are normal. Plantar reflexes are down-going bilaterally. DTRs are symmetrical throughout. Sensory, motor and coordinative functions appear intact. Skin:  Intact without suspicious lesions or rashes Psych:  Cognition and  judgment appear intact. Alert and cooperative with normal attention span and concentration. No apparent delusions, illusions, hallucinations   Impression & Recommendations:  Problem # 1:  HYPERLIPIDEMIA (ICD-272.4) Assessment Improved  Her updated medication list for this problem  includes:    Lovastatin 20 Mg Tabs (Lovastatin) .Marland Kitchen... 1 by mouth once daily for cholesterol  Orders: TLB-Hepatic/Liver Function Pnl (80076-HEPATIC) TLB-Lipid Panel (80061-LIPID) TLB-TSH (Thyroid Stimulating Hormone) (84443-TSH)  Problem # 2:  HYPOTHYROIDISM (ICD-244.9) Assessment: Unchanged  Her updated medication list for this problem includes:    Synthroid 50 Mcg Tabs (Levothyroxine sodium) ..... Once daily  Problem # 3:  FATTY LIVER DISEASE (ICD-571.8) Assessment: Comment Only  Problem # 4:  KELOID (ICD-701.4) Assessment: Unchanged  Problem # 5:  FIBROMYALGIA (ICD-729.1) Assessment: Unchanged  Her updated medication list for this problem includes:    Tylenol 325 Mg Tabs (Acetaminophen) .Marland Kitchen... As needed    Cyclobenzaprine Hcl 10 Mg Tabs (Cyclobenzaprine hcl) .Marland Kitchen... 1/2-1 tab by mouth two times a day as needed muscle spasms  Complete Medication List: 1)  Norvasc 5 Mg Tabs (Amlodipine besylate) .... Once daily 2)  Synthroid 50 Mcg Tabs (Levothyroxine sodium) .... Once daily 3)  Methylprednisolone 4 Mg Tabs (Methylprednisolone) .... Once daily 4)  Tylenol 325 Mg Tabs (Acetaminophen) .... As needed 5)  Vitamin D3 1000 Unit Tabs (Cholecalciferol) .Marland Kitchen.. 1 by mouth daily 6)  Anastrozole 1 Mg Tabs (Anastrozole) .... One tablet by mouth once daily 7)  Omeprazole 40 Mg Cpdr (Omeprazole) .... One tablet by mouth once daily 8)  Lovastatin 20 Mg Tabs (Lovastatin) .Marland Kitchen.. 1 by mouth once daily for cholesterol 9)  Cyclobenzaprine Hcl 10 Mg Tabs (Cyclobenzaprine hcl) .... 1/2-1 tab by mouth two times a day as needed muscle spasms  Patient Instructions: 1)  Please schedule a follow-up appointment in 9 months well w/labs. 2)  Try Valerian root

## 2010-02-07 NOTE — Letter (Signed)
Summary: Colonoscopy Letter  [facility]  [address] [place], [place] [address]   Phone: [phone]  Fax: [phone]      June 16, 2009 MRN: [id number]   Deanna Salazar [address] [place], [place]  [address]   Dear Ms. Dohmen,   According to your medical record, it is time for you to schedule a Colonoscopy. The American Cancer Society recommends this procedure as a method to detect early colon cancer. Patients with a family history of colon cancer, or a personal history of colon polyps or inflammatory bowel disease are at increased risk.  This letter has beeen generated based on the recommendations made at the time of your procedure. If you feel that in your particular situation this may no longer apply, please contact our office.  Please call our office at [phone] to schedule this appointment or to update your records at your earliest convenience.  Thank you for cooperating with [place] to provide you with the very best care possible.   Sincerely,  Judie Petit T. Russella Dar, M.D.  [facility] Gastroenterology Division [phone]

## 2010-02-07 NOTE — Letter (Signed)
Summary: Previsit letter  [facility]  [address] [place], [place] [address]   Phone: [phone]  Fax: [phone]       07/13/2009 MRN: [id number]  Deanna Salazar [address] [place], [place]  [address]  Dear Deanna Salazar,  Welcome to the Gastroenterology Division at [facility].    You are scheduled to see a nurse for your pre-procedure visit on July 26, 2009 at 3:30 pm on the 3rd floor at [facility], [address]  We ask that you try to arrive at our office 15 minutes prior to your appointment time to allow for check-in.  Your nurse visit will consist of discussing your medical and surgical history, your immediate family medical history, and your medications.    Please bring a complete list of all your medications or, if you prefer, bring the medication bottles and we will list them.  We will need to be aware of both prescribed and over the counter drugs.  We will need to know exact dosage information as well.  If you are on blood thinners (Coumadin, Plavix, Aggrenox, Ticlid, etc.) please call our office today/prior to your appointment, as we need to consult with your physician about holding your medication.   Please be prepared to read and sign documents such as consent forms, a financial agreement, and acknowledgement forms.  If necessary, and with your consent, a friend or relative is welcome to sit-in on the nurse visit with you.  Please bring your insurance card so that we may make a copy of it.  If your insurance requires a referral to see a specialist, please bring your referral form from your primary care physician.  No co-pay is required for this nurse visit.     If you cannot keep your appointment, please call [phone] to cancel or reschedule prior to your appointment date.  This allows [place] the opportunity to schedule an appointment for another patient in need of care.    Thank you for choosing [facility] for your medical  needs.  We appreciate the opportunity to care for you.  Please visit [place] at our website  to learn more about our practice.                     Sincerely.                                                                                                                   The Gastroenterology Division

## 2010-02-07 NOTE — Procedures (Signed)
Summary: Colonoscopy  Patient: Deanna Salazar Note: All result statuses are Final unless otherwise noted.  Tests: (1) Colonoscopy (COL)   COL Colonoscopy           DONE     [facility]     [address]     [place], [place]  [address]           COLONOSCOPY PROCEDURE REPORT           PATIENT:  Deanna Salazar, Deanna Salazar  MR#:  [id number]     BIRTHDATE:  Oct 28, 1940, 68 yrs. old  GENDER:  female     ENDOSCOPIST:  Judie Petit T. Russella Dar, MD, [facility]           PROCEDURE DATE:  08/02/2009     PROCEDURE:  Colonoscopy with snare polypectomy     ASA CLASS:  Class II     INDICATIONS:  1) follow-up of polyp, surveillance and high-risk     screening, adenomatous polyp, 04/2000. 2) family history  of colon     cancer-2 brothers @ 20 and 83.     MEDICATIONS:   Fentanyl 75 mcg IV, Versed 9 mg IV     DESCRIPTION OF PROCEDURE:   After the risks benefits and     alternatives of the procedure were thoroughly explained, informed     consent was obtained.  Digital rectal exam was performed and     revealed no abnormalities.   The LB [id number] endoscope     was introduced through the anus and advanced to the cecum, which     was identified by both the appendix and ileocecal valve, without     limitations.  The quality of the prep was excellent, using     MoviPrep.  The instrument was then slowly withdrawn as the colon     was fully examined.     <<PROCEDUREIMAGES>>     FINDINGS:  A sessile polyp was found in the descending colon. It     was 5 mm in size. Polyp was snared without cautery. Retrieval was     successful. A sessile polyp was found in the sigmoid colon. It was     5 mm in size. Polyp was snared without cautery. Retrieval was     successful.  A normal appearing cecum, ileocecal valve, and     appendiceal orifice were identified. The ascending, hepatic     flexure, transverse, splenic flexure, descending, sigmoid colon,     and rectum appeared unremarkable. Retroflexed views in the rectum  revealed no abnormalities. The time to cecum =  3.25  minutes. The     scope was then withdrawn (time =  12.5  min) from the patient and     the procedure completed.           COMPLICATIONS:  None           ENDOSCOPIC IMPRESSION:     1) 5 mm sessile polyp in the descending colon     2) 5 mm sessile polyp in the sigmoid colon           RECOMMENDATIONS:     1) Await pathology results     2) Repeat Colonoscopy in 3 years.           Venita Lick. Russella Dar, MD, [facility]           CC: Linda Hedges. Plotnikov, MD           n.     eSIGNED:  Venita Lick. Genean Adamski at 08/02/2009 03:19 PM           [name], [id number]  Note: An exclamation mark (!) indicates a result that was not dispersed into the flowsheet. Document Creation Date: 08/02/2009 3:19 PM _______________________________________________________________________  (1) Order result status: Final Collection or observation date-time: 08/02/2009 15:14 Requested date-time:  Receipt date-time:  Reported date-time:  Referring Physician:   Ordering Physician: Claudette Head [phone]) Specimen Source:  Source: Launa Grill Order Number: [id number] Lab site:   Appended Document: Colonoscopy     Procedures Next Due Date:    Colonoscopy: 07/2012

## 2010-02-07 NOTE — Progress Notes (Signed)
Summary: Today's BP reading  Phone Note Call from Patient   Caller: [phone] Summary of Call: Patient called requesting to know what BP reading was from today's visit. Please advise Initial call taken by: Rock Nephew CMA,  November 07, 2009 5:08 PM  Follow-up for Phone Call        pt informed  Follow-up by: Lanier Prude, [facility]),  November 07, 2009 5:14 PM

## 2010-02-07 NOTE — Procedures (Signed)
Summary: Esophageal Manometry/[facility]  Esophageal Manometry/[facility]   Imported By: Sherian Rein 04/15/2009 14:46:38  _____________________________________________________________________  External Attachment:    Type:   Image     Comment:   External Document

## 2010-02-07 NOTE — Miscellaneous (Signed)
Summary: Lec previsit  Clinical Lists Changes  Medications: Added new medication of MOVIPREP 100 GM  SOLR (PEG-KCL-NACL-NASULF-NA ASC-C) As per prep instructions. - Signed Rx of MOVIPREP 100 GM  SOLR (PEG-KCL-NACL-NASULF-NA ASC-C) As per prep instructions.;  #1 x 0;  Signed;  Entered by: Ulis Rias RN;  Authorized by: Meryl Dare MD [facility];  Method used: Electronically to [facility] [address] [phone]*, [address] [place], [place]  [address], Ph: [phone], Fax: [phone] Observations: Added new observation of ALLERGY REV: Done (07/26/2009 15:11)    Prescriptions: MOVIPREP 100 GM  SOLR (PEG-KCL-NACL-NASULF-NA ASC-C) As per prep instructions.  #1 x 0   Entered by:   Ulis Rias RN   Authorized by:   Meryl Dare MD [facility]   Signed by:   Ulis Rias RN on 07/26/2009   Method used:   Electronically to        [facility] [address] [phone]* (retail)       [address]       [place], [place]  [address]       Ph: [phone]       Fax: [phone]   RxID:   [id number]

## 2010-02-07 NOTE — Assessment & Plan Note (Signed)
Summary: problems swallowing--ch.   History of Present Illness Visit Type: Follow-up Visit Primary GI MD: Elie Goody MD [facility] Primary Provider: Jacinta Shoe, MD  Requesting Provider: n/a Chief Complaint: Epigastric pain, and dysphagia  History of Present Illness:   Deanna Salazar, returns today complaining of epigastric tenderness and difficulty swallowing solids and liquids.  She has had similar complaints in the past and underwent upper endoscopy in February 2010 which was entirely normal. She had an empiric dilation performed at that time and it is not clear if her dysphagia responded to dilation. She has underlying GERD that was previously well controlled on Dexilant however it was changed to omeprazole dueto to insurance coverage reasons.   GI Review of Systems    Reports abdominal pain, dysphagia with liquids, and  dysphagia with solids.     Location of  Abdominal pain: epigastric area.    Denies acid reflux, belching, bloating, chest pain, heartburn, loss of appetite, nausea, vomiting, vomiting blood, weight loss, and  weight gain.        Denies anal fissure, black tarry stools, change in bowel habit, constipation, diarrhea, diverticulosis, fecal incontinence, heme positive stool, hemorrhoids, irritable bowel syndrome, jaundice, light color stool, liver problems, rectal bleeding, and  rectal pain.   Current Medications (verified): 1)  Norvasc 5 Mg  Tabs (Amlodipine Besylate) .... Once Daily 2)  Synthroid 50 Mcg  Tabs (Levothyroxine Sodium) .... Once Daily 3)  Methylprednisolone 4 Mg  Tabs (Methylprednisolone) .... Once Daily 4)  Tylenol 325 Mg  Tabs (Acetaminophen) .... As Needed 5)  Vitamin D3 1000 Unit  Tabs (Cholecalciferol) .Marland Kitchen.. 1 By Mouth Daily 6)  Anastrozole 1 Mg Tabs (Anastrozole) .... One Tablet By Mouth Once Daily 7)  Omeprazole 40 Mg Cpdr (Omeprazole) .... One Tablet By Mouth Once Daily  Allergies (verified): 1)  Hydrocodone 2)  Codeine  Past  History:  Past Medical History: Reviewed history from 03/03/2009 and no changes required. 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 Thyroidectomy left Lumpectomy L 2009 OS Cataract Extraction 2009  Family History: Reviewed history from 10/02/2007 and no changes required. Family History Hypertension Family History of Colon Cancer: 2 Brothers ages  18 and 74  Social History: Reviewed history from 10/02/2007 and no changes required. Retired Never Smoked Alcohol use-no Illicit Drug Use - no Patient does not get regular exercise.   Review of Systems       The patient complains of fatigue and voice change.         The pertinent positives and negatives are noted as above and in the HPI. All other ROS were reviewed and were negative.   Vital Signs:  Patient profile:   70 year old female Height:      64 inches Weight:      209 pounds BMI:     36.00 BSA:     1.99 Pulse rate:   72 / minute Pulse rhythm:   regular BP sitting:   128 / 82  (right arm) Cuff size:   regular  Vitals Entered By: Ok Anis CMA (March 24, 2009 9:07 AM)  Physical Exam  General:  Well developed, well nourished, no acute distress. Head:  Normocephalic and atraumatic. Eyes:  PERRLA, no icterus. Mouth:  No deformity or lesions, dentition normal. Lungs:  Clear throughout to auscultation. Heart:  Regular rate and rhythm; no murmurs, rubs,  or bruits. Abdomen:  Soft and nondistended. No masses, hepatosplenomegaly or hernias noted. Normal bowel sounds. Epigastric tenderness to very light palpation of the abdomen consistent with muscular pain or hypersensitivity. Neurologic:  Alert and  oriented x4;  grossly normal  neurologically. Psych:  depressed affect.    Impression & Recommendations:  Problem # 1:  OTHER DYSPHAGIA (ICD-787.29) Intermittent solid and liquid dysphagia with negative endoscopy one year ago. She may have an underlying motility disturbance. Schedule barium esophagram and consider further evaluation with an esophageal manometry study. Component of her symptoms may be related to GERD that is not well controlled on omeprazole. Consider increasing omeprazole 40 mg twice daily pending results of barium study Orders: Barium Swallow with Tablet (BS w/tab)  Problem # 2:  GERD (ICD-530.81) See problem #1.  Problem # 3:  ABDOMINAL PAIN-EPIGASTRIC (ICD-789.06) Probable musculoskeletal pain, possibly secondary to fibromyalgia.  Problem # 4:  FAMILY HX COLON CANCER (ICD-V16.0)  Problem # 5:  IBS (ICD-564.1)  Problem # 6:  DEPRESSION (ICD-311) I am concerned that a component of her gastrointestinal complaints and fatigue could be related to underlying depression. Further evaluation with Dr. Posey Rea.  Patient Instructions: 1)  You have been scheduled for a Barium Swallow. 2)  Please continue current medications.  3)  Copy sent to : Jacinta Shoe, MD 4)  The medication list was reviewed and reconciled.  All changed / newly prescribed medications were explained.  A complete medication list was provided to the patient / caregiver.

## 2010-02-07 NOTE — Letter (Signed)
Summary: [facility]   Imported By: Sherian Rein 03/08/2009 08:22:57  _____________________________________________________________________  External Attachment:    Type:   Image     Comment:   External Document

## 2010-02-07 NOTE — Assessment & Plan Note (Signed)
Summary: COLD SYMPTOMS/COUGH/BACK-SIDE SORENESS-DR AVP PT/NO SLOT--STC   Vital Signs:  Patient profile:   70 year old female Height:      64 inches Weight:      212 pounds BMI:     36.52 O2 Sat:      97 % on Room air Temp:     100.8 degrees F oral Pulse rate:   80 / minute Pulse rhythm:   regular Resp:     16 per minute BP sitting:   122 / 80  (left arm) Cuff size:   large  Vitals Entered By: Rock Nephew CMA (February 04, 2009 3:14 PM)  Nutrition Counseling: Patient's BMI is greater than 25 and therefore counseled on weight management options.  O2 Flow:  Room air  Primary Care Provider:  Jacinta Shoe MD  CC:  URI symptoms.  History of Present Illness:  URI Symptoms      This is a 70 year old woman who presents with URI symptoms.  The symptoms began 6 days ago.  The severity is described as moderate.  The patient reports nasal congestion, purulent nasal discharge, sore throat, dry cough, and earache, but denies productive cough and sick contacts.  Associated symptoms include fever of 100.5-103 degrees.  The patient denies stiff neck, dyspnea, wheezing, rash, vomiting, diarrhea, use of an antipyretic, and response to antipyretic.  The patient also reports muscle aches and severe fatigue.  The patient denies headache.  Risk factors for Strep sinusitis include unilateral facial pain, unilateral nasal discharge, double sickening, and Strep exposure.  The patient denies the following risk factors for Strep sinusitis: tender adenopathy and absence of cough.    Preventive Screening-Counseling & Management  Alcohol-Tobacco     Alcohol drinks/day: 0     Smoking Status: never  Medications Prior to Update: 1)  Norvasc 5 Mg  Tabs (Amlodipine Besylate) .... Once Daily 2)  Synthroid 50 Mcg  Tabs (Levothyroxine Sodium) .... Once Daily 3)  Methylprednisolone 4 Mg  Tabs (Methylprednisolone) .... Once Daily 4)  Tylenol 325 Mg  Tabs (Acetaminophen) .... As Needed 5)  Vitamin D3 1000  Unit  Tabs (Cholecalciferol) .Marland Kitchen.. 1 By Mouth Daily 6)  Arimidex 1 Mg  Tabs (Anastrozole) .... Once Daily 7)  Omeprazole 40 Mg Cpdr (Omeprazole) .... One Tablet By Mouth Once Daily 8)  Cyclobenzaprine Hcl 10 Mg  Tabs (Cyclobenzaprine Hcl) .Marland Kitchen.. 1 By Mouth 2-3  Times Daily As Needed For Back Pain and Spasm 9)  Tramadol Hcl 50 Mg  Tabs (Tramadol Hcl) .Marland Kitchen.. 1-2 By Mouth Two Times A Day-Qid As Needed Pain  Current Medications (verified): 1)  Norvasc 5 Mg  Tabs (Amlodipine Besylate) .... Once Daily 2)  Synthroid 50 Mcg  Tabs (Levothyroxine Sodium) .... Once Daily 3)  Methylprednisolone 4 Mg  Tabs (Methylprednisolone) .... Once Daily 4)  Tylenol 325 Mg  Tabs (Acetaminophen) .... As Needed 5)  Vitamin D3 1000 Unit  Tabs (Cholecalciferol) .Marland Kitchen.. 1 By Mouth Daily 6)  Arimidex 1 Mg  Tabs (Anastrozole) .... Once Daily 7)  Omeprazole 40 Mg Cpdr (Omeprazole) .... One Tablet By Mouth Once Daily  Allergies (verified): 1)  Hydrocodone 2)  Codeine  Past History:  Past Medical History: Reviewed history from 04/06/2008 and no changes required. 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 Thyroidectomy left Lumpectomy L  2009 OS Cataract Extraction 2009  Family History: Reviewed history from 10/02/2007 and no changes required. Family History Hypertension Family History of Colon Cancer: 2 Brothers ages  64 and 70  Social History: Reviewed history from 10/02/2007 and no changes required. Retired Never Smoked Alcohol use-no Illicit Drug Use - no Patient does not get regular exercise.   Review of Systems       The patient complains of prolonged cough.  The patient denies anorexia, chest pain, syncope, headaches, hemoptysis, abdominal pain,  suspicious skin lesions, enlarged lymph nodes, and angioedema.    Physical Exam  General:  alert, well-developed, well-nourished, well-hydrated, and overweight-appearing.   Head:  normocephalic, atraumatic, no abnormalities observed, and no abnormalities palpated.   Eyes:  vision grossly intact, pupils equal, pupils round, and pupils reactive to light.   Ears:  R ear normal and L ear normal.   Nose:  no airflow obstruction, no intranasal foreign body, no nasal polyps, no nasal mucosal lesions, no mucosal friability, no active bleeding or clots, no septum abnormalities, nasal dischargemucosal pallor, mucosal erythema, mucosal edema, L maxillary sinus tenderness, and R maxillary sinus tenderness.   Mouth:  pharyngeal erythema and postnasal drip.   Neck:  supple, full ROM, no masses, no thyromegaly, no JVD, normal carotid upstroke, and no cervical lymphadenopathy.   Lungs:  Normal respiratory effort, chest expands symmetrically. Lungs are clear to auscultation, no crackles or wheezes. Heart:  Normal rate and regular rhythm. S1 and S2 normal without gallop, murmur, click, rub or other extra sounds. Abdomen:  Bowel sounds positive,abdomen soft and non-tender without masses, organomegaly or hernias noted. Msk:  No deformity or scoliosis noted of thoracic or lumbar spine.   Pulses:  R and L carotid,radial,femoral,dorsalis pedis and posterior tibial pulses are full and equal bilaterally Extremities:  No clubbing, cyanosis, edema, or deformity noted with normal full range of motion of all joints.   Neurologic:  No cranial nerve deficits noted. Station and gait are normal. Plantar reflexes are down-going bilaterally. DTRs are symmetrical throughout. Sensory, motor and coordinative functions appear intact. Skin:  Intact without suspicious lesions or rashes Cervical Nodes:  no anterior cervical adenopathy and no posterior cervical adenopathy.   Axillary Nodes:  no R axillary adenopathy and no L axillary  adenopathy.   Inguinal Nodes:  no R inguinal adenopathy and no L inguinal adenopathy.   Psych:  Cognition and judgment appear intact. Alert and cooperative with normal attention span and concentration. No apparent delusions, illusions, hallucinations   Impression & Recommendations:  Problem # 1:  COUGH (ICD-786.2) Assessment New  Orders: T-2 View CXR (71020TC)  Problem # 2:  FEVER, NOS (ICD-780.60) Assessment: Deteriorated  Orders: T-2 View CXR (71020TC)  Problem # 3:  ACUTE BRONCHITIS (ICD-466.0) Assessment: Deteriorated  Her updated medication list for this problem includes:    Avelox Abc Pack 400 Mg Tabs (Moxifloxacin hcl) ..... Once daily for 5 days    Promethazine-dm 6.25-15 Mg/43ml Syrp (Promethazine-dm) .Marland Kitchen... 5-10 ml by mouth qid as needed for cough  Complete Medication List: 1)  Norvasc 5 Mg Tabs (Amlodipine besylate) .... Once daily 2)  Synthroid 50 Mcg Tabs (Levothyroxine sodium) .... Once daily 3)  Methylprednisolone 4 Mg Tabs (Methylprednisolone) .... Once daily 4)  Tylenol 325 Mg Tabs (Acetaminophen) .... As needed 5)  Vitamin D3 1000 Unit Tabs (Cholecalciferol) .Marland Kitchen.. 1 by mouth daily 6)  Arimidex 1 Mg Tabs (Anastrozole) .... Once daily 7)  Omeprazole 40 Mg Cpdr (Omeprazole) .... One tablet by mouth once daily  8)  Avelox Abc Pack 400 Mg Tabs (Moxifloxacin hcl) .... Once daily for 5 days 9)  Promethazine-dm 6.25-15 Mg/34ml Syrp (Promethazine-dm) .... 5-10 ml by mouth qid as needed for cough  Patient Instructions: 1)  Please schedule a follow-up appointment in 2 weeks. 2)  Take your antibiotic as prescribed until ALL of it is gone, but stop if you develop a rash or swelling and contact our office as soon as possible. 3)  Acute bronchitis symptoms for less than 10 days are not helped by antibiotics. take over the counter cough medications. call if no improvment in  5-7 days, sooner if increasing cough, fever, or new symptoms( shortness of breath, chest  pain). Prescriptions: PROMETHAZINE-DM 6.25-15 MG/5ML SYRP (PROMETHAZINE-DM) 5-10 ml by mouth QID as needed for cough  #6 ounces x 1   Entered and Authorized by:   Etta Grandchild MD   Signed by:   Etta Grandchild MD on 02/04/2009   Method used:   Electronically to        [facility] [phone]* (retail)       [address]       [place], [place]  [address]       Ph: [phone]       Fax: [phone]   RxID:   [id number] AVELOX ABC PACK 400 MG TABS (MOXIFLOXACIN HCL) once daily for 5 days  #5 x 0   Entered and Authorized by:   Etta Grandchild MD   Signed by:   Etta Grandchild MD on 02/04/2009   Method used:   Samples Given   RxID:   [id number]

## 2010-02-23 ENCOUNTER — Ambulatory Visit (INDEPENDENT_AMBULATORY_CARE_PROVIDER_SITE_OTHER): Payer: Medicare Other | Admitting: Internal Medicine

## 2010-02-23 ENCOUNTER — Encounter: Payer: Self-pay | Admitting: Internal Medicine

## 2010-02-23 ENCOUNTER — Ambulatory Visit (INDEPENDENT_AMBULATORY_CARE_PROVIDER_SITE_OTHER)
Admission: RE | Admit: 2010-02-23 | Discharge: 2010-02-23 | Disposition: A | Discharge status: Home / Self Care | Payer: Medicare Other | Source: Ambulatory Visit | Attending: Internal Medicine | Admitting: Internal Medicine

## 2010-02-23 ENCOUNTER — Other Ambulatory Visit: Payer: Self-pay | Admitting: Internal Medicine

## 2010-02-23 DIAGNOSIS — J209 Acute bronchitis, unspecified: Secondary | ICD-10-CM

## 2010-02-23 DIAGNOSIS — N281 Cyst of kidney, acquired: Secondary | ICD-10-CM

## 2010-02-23 DIAGNOSIS — F329 Major depressive disorder, single episode, unspecified: Secondary | ICD-10-CM

## 2010-02-23 DIAGNOSIS — R079 Chest pain, unspecified: Secondary | ICD-10-CM | POA: Insufficient documentation

## 2010-02-23 HISTORY — DX: Chest pain, unspecified: R07.9

## 2010-02-23 HISTORY — DX: Cyst of kidney, acquired: N28.1

## 2010-03-01 NOTE — Assessment & Plan Note (Signed)
Summary: SIDE PAIN / PNEMONIA? /NWS   Vital Signs:  Patient profile:   70 year old female Height:      65 inches Weight:      198.38 pounds BMI:     33.13 O2 Sat:      96 % on Room air Temp:     99 degrees F oral Pulse rate:   77 / minute BP sitting:   130 / 90  (left arm) Cuff size:   regular  Vitals Entered By: Zella Ball Ewing CMA (AAMA) (February 23, 2010 2:42 PM)  O2 Flow:  Room air CC: Cough, fever, left side pain/RE   Primary Care Provider:  Jacinta Shoe, MD   CC:  Cough, fever, and left side pain/RE.  History of Present Illness: here with acute onset 3 days fever, general weakness and malaise, mild ST, left maxillary sinus pain  and gradually worsening prod cough greenish sputum,  and Pt denies  worsening sob, doe, wheezing, orthopnea, pnd, worsening LE edema, palps, dizziness or syncope .  Does have bilateral lateral chest pains, sharp, worse with deep inspiration and cough , and is concerned she may have pna.  Pt denies new neuro symptoms such as headache, facial or extremity weakness  Pt denies polydipsia, polyuria   Overall good compliance with meds, trying to follow low chol diet, wt stable, little excercise however .  Also concerned about the recent imaging with ? complex left renal cyst and wants to discuss today, and have copyof report.  Denies abd pain, flank pain, dysuria, urgency, freq, hematuria, incontinencne or other GU.  Overall good compliance with meds, and good tolerability.  Denies worsening depressive symptoms, suicidal ideation, or panic., though has ongoing anxiety, but feels stable on current tx.  No worsening reflux symptoms recently  - no regurgitation, CP, dysphagia, n/v, abd pain, bowel change or blood.    Preventive Screening-Counseling & Management      Drug Use:  no.    Problems Prior to Update: 1)  Bronchitis-acute  (ICD-466.0) 2)  Hypokalemia  (ICD-276.8) 3)  Ruq Pain  (ICD-789.01) 4)  Hyperlipidemia  (ICD-272.4) 5)  Abdominal  Pain-epigastric  (ICD-789.06) 6)  Fever, Nos  (ICD-780.60) 7)  Hip Pain  (ICD-719.45) 8)  Back Pain  (ICD-724.5) 9)  Dysuria  (ICD-788.1) 10)  Paresthesia  (ICD-782.0) 11)  Fatigue  (ICD-780.79) 12)  Abdominal Pain, Generalized  (ICD-789.07) 13)  Other Dysphagia  (ICD-787.29) 14)  Hypokalemia  (ICD-276.8) 15)  Depression  (ICD-311) 16)  Family Hx Colon Cancer  (ICD-V16.0) 17)  Keloid  (ICD-701.4) 18)  Vision Impairment, Low Vision, One Eye-left  (ICD-369.70) 19)  Murmur  (ICD-785.2) 20)  Hemorrhoids, Internal  (ICD-455.0) 21)  Colonic Polyps, Adenomatous, Hx of  (ICD-V12.72) 22)  Hematochezia  (ICD-578.1) 23)  Constipation, Chronic  (ICD-564.09) 24)  Breast Cancer, Hx of  (ICD-V10.3) 25)  Acute Bronchitis  (ICD-466.0) 26)  Hypertension  (ICD-401.9) 27)  Hypothyroidism  (ICD-244.9) 28)  Insomnia, Persistent  (ICD-307.42) 29)  Fatigue  (ICD-780.79) 30)  Fatty Liver Disease  (ICD-571.8) 31)  Ibs  (ICD-564.1) 32)  Fibromyalgia  (ICD-729.1) 33)  Rheumatoid Arthritis  (ICD-714.0) 34)  Gerd  (ICD-530.81)  Medications Prior to Update: 1)  Norvasc 5 Mg  Tabs (Amlodipine Besylate) .... Once Daily 2)  Synthroid 50 Mcg  Tabs (Levothyroxine Sodium) .... Once Daily 3)  Methylprednisolone 4 Mg  Tabs (Methylprednisolone) .... Once Daily 4)  Tylenol 325 Mg  Tabs (Acetaminophen) .... As Needed 5)  Vitamin D3 1000 Unit  Tabs (Cholecalciferol) .Marland Kitchen.. 1 By Mouth Daily 6)  Anastrozole 1 Mg Tabs (Anastrozole) .... One Tablet By Mouth Once Daily 7)  Omeprazole 40 Mg Cpdr (Omeprazole) .... One Tablet By Mouth Once Daily 8)  Lovastatin 20 Mg Tabs (Lovastatin) .Marland Kitchen.. 1 By Mouth Once Daily For Cholesterol 9)  Cyclobenzaprine Hcl 10 Mg Tabs (Cyclobenzaprine Hcl) .... 1/2-1 Tab By Mouth Two Times A Day As Needed Muscle Spasms 10)  Ciprofloxacin Hcl 500 Mg Tabs (Ciprofloxacin Hcl) .Marland Kitchen.. 1 By Mouth Bid 11)  Ibuprofen 600 Mg Tabs (Ibuprofen) .Marland Kitchen.. 1 By Mouth Bid  Pc  As Needed For  Pain 12)   Oxycodone-Acetaminophen 5-325 Mg Tabs (Oxycodone-Acetaminophen) .Marland Kitchen.. 1 By Mouth Qid As Needed Pain 13)  Klor-Con M10 10 Meq Cr-Tabs (Potassium Chloride Crys Cr) .Marland Kitchen.. 1 By Mouth Qd  Current Medications (verified): 1)  Norvasc 5 Mg  Tabs (Amlodipine Besylate) .... Once Daily 2)  Synthroid 50 Mcg  Tabs (Levothyroxine Sodium) .... Once Daily 3)  Methylprednisolone 4 Mg  Tabs (Methylprednisolone) .... Once Daily 4)  Tylenol 325 Mg  Tabs (Acetaminophen) .... As Needed 5)  Vitamin D3 1000 Unit  Tabs (Cholecalciferol) .Marland Kitchen.. 1 By Mouth Daily 6)  Anastrozole 1 Mg Tabs (Anastrozole) .... One Tablet By Mouth Once Daily 7)  Omeprazole 40 Mg Cpdr (Omeprazole) .... One Tablet By Mouth Once Daily 8)  Lovastatin 20 Mg Tabs (Lovastatin) .Marland Kitchen.. 1 By Mouth Once Daily For Cholesterol 9)  Cyclobenzaprine Hcl 10 Mg Tabs (Cyclobenzaprine Hcl) .... 1/2-1 Tab By Mouth Two Times A Day As Needed Muscle Spasms 10)  Levaquin 250 Mg Tabs (Levofloxacin) .Marland Kitchen.. 1po Once Daily 11)  Ibuprofen 600 Mg Tabs (Ibuprofen) .Marland Kitchen.. 1 By Mouth Bid  Pc  As Needed For  Pain 12)  Oxycodone-Acetaminophen 5-325 Mg Tabs (Oxycodone-Acetaminophen) .Marland Kitchen.. 1 By Mouth Qid As Needed Pain 13)  Klor-Con M10 10 Meq Cr-Tabs (Potassium Chloride Crys Cr) .Marland Kitchen.. 1 By Mouth Qd 14)  Tessalon Perles 100 Mg Caps (Benzonatate) .Marland Kitchen.. 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  Drug use-no  Risk Factors: Alcohol Use: 0 (02/04/2009) Exercise: no (10/02/2007)  Risk Factors: Smoking Status: never (03/03/2009)  Social History: Retired Never Smoked Alcohol use-no Illicit Drug Use - no Patient does not get regular exercise.  Drug use-no  Review of Systems       all otherwise negative per pt -    Physical Exam  General:  Looks tired, NAD and overweight-appearing.  , mild ill  Head:  Normocephalic and atraumatic without obvious abnormalities. No apparent alopecia or balding. Eyes:  vision grossly intact, pupils equal, pupils round, and pupils reactive to light.   Ears:  bilat tm's mild erythema, left max sinus tender Nose:  nasal dischargemucosal pallor and mucosal edema.   Mouth:  pharyngeal erythema and fair dentition.   Neck:  supple and cervical lymphadenopathy.   Lungs:  normal respiratory effort, R decreased breath sounds, and L decreased breath sounds.  but no frank wheezing or rales Heart:  normal rate and regular rhythm.   Abdomen:  soft, non-tender, and normal bowel sounds., no palpable mass Msk:  no flank tender on left Extremities:  no edema, no erythema  Psych:  dysphoric affect and moderately anxious.     Impression & Recommendations:  Problem # 1:  BRONCHITIS-ACUTE (ICD-466.0)  Her updated medication list for this problem includes:    Levaquin 250 Mg Tabs (Levofloxacin) .Marland Kitchen... 1po once daily    Tessalon Perles 100 Mg Caps (Benzonatate) .Marland Kitchen... 1-2 by mouth three times a day as needed  Orders: T-2 View CXR, Same Day (71020.5TC) treat as above, f/u any worsening signs or symptoms , cant r/o pna - for cxr today as well  Problem # 2:  CHEST PAIN (ICD-786.50) c/w MSK most likely secondary to cough, but also for cxr as above, and cough med  Problem # 3:  RENAL CYST, LEFT (ICD-593.2) somewhat complex, gave copy of report, d/w pt,  and she related she has also d/w PCP  who has planned f/u imaging at 6 months, tried to reassure today regarding this plan  Problem # 4:  DEPRESSION (ICD-311) stable overall by hx and exam,  pt declines need for  meds at this time  Discussed treatment options, including trial of antidpressant medication. Patient agrees to call if any worsening of symptoms or thoughts of doing harm arise. Verified that the patient has no suicidal ideation at this time.   Complete Medication List: 1)  Norvasc 5 Mg Tabs (Amlodipine besylate) .... Once daily 2)  Synthroid 50 Mcg Tabs (Levothyroxine sodium) .... Once daily 3)  Methylprednisolone 4 Mg Tabs (Methylprednisolone) .... Once daily 4)  Tylenol 325 Mg Tabs (Acetaminophen) .... As needed 5)  Vitamin D3 1000 Unit Tabs (Cholecalciferol) .Marland Kitchen.. 1 by mouth daily 6)  Anastrozole 1 Mg Tabs (Anastrozole) .... One tablet by mouth once daily 7)  Omeprazole 40 Mg Cpdr (Omeprazole) .... One tablet by mouth once daily 8)  Lovastatin 20 Mg Tabs (Lovastatin) .Marland Kitchen.. 1 by mouth once daily for cholesterol 9)  Cyclobenzaprine Hcl 10 Mg Tabs (Cyclobenzaprine hcl) .... 1/2-1 tab by mouth two times a day as needed muscle spasms 10)  Levaquin 250 Mg Tabs (Levofloxacin) .Marland Kitchen.. 1po once daily 11)  Ibuprofen 600 Mg Tabs (Ibuprofen) .Marland Kitchen.. 1 by mouth bid  pc  as needed for  pain 12)  Oxycodone-acetaminophen 5-325 Mg Tabs (Oxycodone-acetaminophen) .Marland Kitchen.. 1 by mouth qid as needed pain 13)  Klor-con M10 10 Meq Cr-tabs (Potassium chloride crys cr) .Marland Kitchen.. 1 by mouth qd 14)  Tessalon Perles 100 Mg Caps (Benzonatate) .Marland Kitchen.. 1-2 by mouth three times a day as needed  Patient Instructions: 1)  Please take all new medications as prescribed 2)  Continue all previous medications as before this visit  3)  You can also use Mucinex OTC or it's generic for congestion  4)  Please go to Radiology in the basement level for your X-Ray today  5)  Please schedule an appointment with your primary doctor by May 2012 if you have not heard about the  followup for the kidney test Prescriptions: TESSALON PERLES 100 MG CAPS (BENZONATATE) 1-2 by mouth three times a day as needed  #60 x 1   Entered and Authorized by:   Corwin Levins MD   Signed by:   Corwin Levins MD on 02/23/2010   Method used:   Print then Give to Patient   RxID:   [id number] LEVAQUIN 250 MG TABS (  LEVOFLOXACIN) 1po once daily  #10 x 0   Entered and Authorized by:   Corwin Levins MD   Signed by:   Corwin Levins MD on 02/23/2010   Method used:   Print then Give to Patient   RxID:   [id number]    Orders Added: 1)  T-2 View CXR, Same Day [71020.5TC] 2)  Est. Patient Level IV [87564]

## 2010-03-20 ENCOUNTER — Telehealth: Payer: Self-pay | Admitting: Internal Medicine

## 2010-03-22 ENCOUNTER — Telehealth: Payer: Self-pay | Admitting: Gastroenterology

## 2010-03-23 ENCOUNTER — Other Ambulatory Visit: Payer: Medicare Other

## 2010-03-23 ENCOUNTER — Other Ambulatory Visit: Payer: Self-pay | Admitting: Physician Assistant

## 2010-03-23 ENCOUNTER — Ambulatory Visit (INDEPENDENT_AMBULATORY_CARE_PROVIDER_SITE_OTHER): Payer: Medicare Other | Admitting: Physician Assistant

## 2010-03-23 ENCOUNTER — Encounter: Payer: Self-pay | Admitting: Physician Assistant

## 2010-03-23 DIAGNOSIS — Z860100 Personal history of colon polyps, unspecified: Secondary | ICD-10-CM

## 2010-03-23 DIAGNOSIS — R197 Diarrhea, unspecified: Secondary | ICD-10-CM

## 2010-03-23 DIAGNOSIS — K219 Gastro-esophageal reflux disease without esophagitis: Secondary | ICD-10-CM

## 2010-03-23 DIAGNOSIS — R110 Nausea: Secondary | ICD-10-CM

## 2010-03-23 HISTORY — DX: Nausea: R11.0

## 2010-03-23 LAB — CBC WITH DIFFERENTIAL/PLATELET
Basophils Relative: 0.5 % (ref 0.0–3.0)
Eosinophils Relative: 5.3 % — ABNORMAL HIGH (ref 0.0–5.0)
HCT: 35.4 % — ABNORMAL LOW (ref 36.0–46.0)
Hemoglobin: 11.9 g/dL — ABNORMAL LOW (ref 12.0–15.0)
Lymphs Abs: 1.9 10*3/uL (ref 0.7–4.0)
MCV: 87.6 fl (ref 78.0–100.0)
Monocytes Absolute: 0.6 10*3/uL (ref 0.1–1.0)
Monocytes Relative: 12.8 % — ABNORMAL HIGH (ref 3.0–12.0)
Neutro Abs: 2 10*3/uL (ref 1.4–7.7)
Platelets: 216 10*3/uL (ref 150.0–400.0)
RBC: 4.04 Mil/uL (ref 3.87–5.11)
WBC: 4.7 10*3/uL (ref 4.5–10.5)

## 2010-03-23 LAB — BASIC METABOLIC PANEL
CO2: 29 mEq/L (ref 19–32)
Calcium: 9.8 mg/dL (ref 8.4–10.5)
Creatinine, Ser: 0.9 mg/dL (ref 0.4–1.2)
GFR: 75.58 mL/min (ref >60.00)
Glucose, Bld: 87 mg/dL (ref 70–99)
Sodium: 142 mEq/L (ref 135–145)

## 2010-03-24 ENCOUNTER — Other Ambulatory Visit: Payer: Self-pay | Admitting: Physician Assistant

## 2010-03-24 ENCOUNTER — Encounter: Payer: Self-pay | Admitting: Physician Assistant

## 2010-03-24 DIAGNOSIS — R110 Nausea: Secondary | ICD-10-CM

## 2010-03-24 DIAGNOSIS — R197 Diarrhea, unspecified: Secondary | ICD-10-CM

## 2010-03-28 LAB — STOOL CULTURE

## 2010-03-28 NOTE — Progress Notes (Signed)
Summary: Stomach pian   Phone Note Call from Patient Call back at [phone] cell   Call For: Dr Russella Dar Reason for Call: Talk to Nurse Summary of Call: Her stomach is really hurting her x1wk. thought she had a virus but its not. would like to get in soon to be seen please. Initial call taken by: Leanor Kail [facility],  March 22, 2010 3:52 PM  Follow-up for Phone Call        Patient c/o diarrhea that started Sunday.  She is c/o now having watery diarrhea and lower abdominal pain.  She states this is very tender when she walks.  Patient will come in and see Amy Esterwood PA in the am at 9:30 Follow-up by: Darcey Nora RN, CGRN,  March 22, 2010 4:22 PM

## 2010-03-28 NOTE — Progress Notes (Signed)
Summary: RX?   Phone Note Call from Patient   Summary of Call: Pt says her arthritis MD is unable to see her for another 2 wks. She is rx to help w/arthritis pain.  Initial call taken by: Lamar Sprinkles, CMA,  March 20, 2010 11:12 AM  Follow-up for Phone Call        OK Ibuprof Rx Follow-up by: Tresa Garter MD,  March 20, 2010 6:05 PM  Additional Follow-up for Phone Call Additional follow up Details #1::        Patient states that ibuprofen causes stomach upset and she wants rx for prednisone.  Additional Follow-up by: Lamar Sprinkles, CMA,  March 20, 2010 6:36 PM    Additional Follow-up for Phone Call Additional follow up Details #2::    She needs an OV w/any MD to discuss Prednisone Follow-up by: Tresa Garter MD,  March 21, 2010 1:41 PM  Additional Follow-up for Phone Call Additional follow up Details #3:: Details for Additional Follow-up Action Taken: pt informed. she declines to sched appt now...she states she will go to ER if she gets any worse. I reassured her that we could schedule her with one of our MDs. Additional Follow-up by: Lanier Prude, [facility]),  March 22, 2010 3:10 PM  New/Updated Medications: IBUPROFEN 400 MG TABS (IBUPROFEN) 1 by mouth two times a day pc prn

## 2010-03-28 NOTE — Assessment & Plan Note (Signed)
Summary: watery diarrhea    History of Present Illness Visit Type: Follow-up Visit Primary GI MD: Elie Goody MD [facility] Primary Provider: Jacinta Shoe, MD  Requesting Provider: n/a Chief Complaint: watery diarrhea every 30 mins since Sundayand lower/generalized abdominal pain which is worse after eating History of Present Illness:   This is a 70 year old female known to  Dr.Stark. She was last seen in July 2011 at time   of colonoscopy. She was found to have adenomatous polyps. She also has family history of colon cancer and is therefore recommended for repeat procedure in 3 years.    she comes in today after acute onset of illness 5 days ago with chills low-grade fever nausea reflux upper abdominal discomfort and diarrhea. Since that time she has had multiple episodes watery diarrhea per day and also during the night. The stool has been malodorous and nonbloody she relates some cramping in her hands and feet as well She has been trying to push fluids and only grade level is keeping some bland food down.    her only possible known infectious contact is her granddaughter who was ill on Monday but only for one day. Patient did take a course of Levaquin towards the end of February for an upper respiratory infection. Actually over the past 18 hours she has had less diarrhea and was not up during the night last night.   Patient is on chronic omeprazole for acid reflux and says that this has not been working as well recently and she has noticed more evening symptoms over the past couple of months.    GI Review of Systems    Reports abdominal pain, acid reflux, and  nausea.     Location of  Abdominal pain: lower abdomen.    Denies belching, bloating, chest pain, dysphagia with liquids, dysphagia with solids, heartburn, loss of appetite, vomiting, vomiting blood, weight loss, and  weight gain.      Reports change in bowel habits and  diarrhea.     Denies anal fissure, black tarry stools,  constipation, diverticulosis, fecal incontinence, heme positive stool, hemorrhoids, irritable bowel syndrome, jaundice, light color stool, liver problems, rectal bleeding, and  rectal pain.    Current Medications (verified): 1)  Norvasc 5 Mg  Tabs (Amlodipine Besylate) .... Once Daily 2)  Synthroid 50 Mcg  Tabs (Levothyroxine Sodium) .... Once Daily 3)  Tylenol 325 Mg  Tabs (Acetaminophen) .... As Needed 4)  Vitamin D3 1000 Unit  Tabs (Cholecalciferol) .Marland Kitchen.. 1 By Mouth Daily 5)  Anastrozole 1 Mg Tabs (Anastrozole) .... One Tablet By Mouth Once Daily 6)  Omeprazole 40 Mg Cpdr (Omeprazole) .... One Tablet By Mouth Once Daily 7)  Lovastatin 20 Mg Tabs (Lovastatin) .Marland Kitchen.. 1 By Mouth Once Daily For Cholesterol 8)  Klor-Con M10 10 Meq Cr-Tabs (Potassium Chloride Crys Cr) .Marland Kitchen.. 1 By Mouth Qd 9)  Caltrate 600+d Plus 600-400 Mg-Unit Tabs (Calcium Carbonate-Vit D-Min) .Marland Kitchen.. 1 By Mouth Once Daily  Allergies: 1)  Hydrocodone 2)  Codeine 3)  Tramadol Hcl  Past History:  Past Medical History: Reviewed history from 10/19/2009 and no changes required. 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 Family History Hypertension Family History of Colon  Cancer: 2 Brothers ages  84 and 40  Social History: Reviewed history from 02/23/2010 and no changes required. Retired Never Smoked Alcohol use-no Illicit Drug Use - no Patient does not get regular exercise.  Drug use-no  Review of Systems  The patient denies allergy/sinus, anemia, anxiety-new, arthritis/joint pain, back pain, blood in urine,  breast changes/lumps, change in vision, confusion, cough, coughing up blood, depression-new, fainting, fatigue, fever, headaches-new, hearing problems, heart murmur, heart rhythm changes, itching, menstrual pain, muscle pains/cramps, night sweats, nosebleeds, pregnancy symptoms, shortness of breath, skin rash, sleeping problems, sore throat, swelling of feet/legs, swollen lymph glands, thirst - excessive , urination - excessive , urination changes/pain, urine leakage, vision changes, and voice change.         see hpi  Vital Signs:  Patient profile:   70 year old female Height:      65 inches Weight:      195.6 pounds BMI:     32.67 Pulse rate:   84 / minute Pulse rhythm:   regular BP sitting:   122 / 78  (left arm)  Vitals Entered By: Milford Cage NCMA (March 23, 2010 9:38 AM)  Physical Exam  General:  Well developed, well nourished, no acute distress. Head:  Normocephalic and atraumatic. Eyes:  PERRLA, no icterus. Lungs:  Clear throughout to auscultation. Heart:  Regular rate and rhythm; no murmurs, rubs,  or bruits. Abdomen:  soft, mildly tender epigastrium, no guarding, no mass or hsm,bs+ Rectal:  not done Extremities:  No clubbing, cyanosis, edema or deformities noted. Neurologic:  Alert and  oriented x4;  grossly normal neurologically. Psych:  Alert and cooperative. Normal mood and affect.   Impression & Recommendations:  Problem # 1:  DIARRHEA-PRESUMED INFECTIOUS (ICD-009.3) Assessment Improved 69 YO FEMALE WITH ACUTE ILLNESS X 5 DAYS ,DIARRHEA, NAUSEA,LOW GRADE FEVER, INCREASED REFLUX. R/O C.DIFF,R/O VIRAL GASTROENTERITIS. SHE HAS IMPROVED OVER THE PAST 24 HOURS.  LABS AS BELOW,STOOL STUDIES INCREASE OMEPRAZOLE TO TWICE DAILY X ONE MONTH PUSH FLUIDS, BLAND DIET ALIGN ONE DAILY X 30 DAYS-SAMPLES GIVEN WILL HOLD ON ABX UNTIL  CULTURES RETURN GIVEN CLINICAL IMPROVEMENT PAST 24 HRS-HOPEFULLY RESOLVING VIRAL SYNDROME  Problem # 2:  FAMILY HX COLON CANCER  (ICD-V16.0) Assessment: Comment Only UP TO DATE ,LAST COLON 7/11  Problem # 3:  COLONIC POLYPS, ADENOMATOUS, HX OF (ICD-V12.72) Assessment: Comment Only  Problem # 4:  HYPERTENSION (ICD-401.9) Assessment: Comment Only  Problem # 7:  RHEUMATOID ARTHRITIS (ICD-714.0) Assessment: Comment Only  Other Orders: T-Culture, Stool ([id number]) T-C diff by PCR (16109) T-Fecal WBC (60454-09811) TLB-BMP (Basic Metabolic Panel-BMET) (80048-METABOL) TLB-CBC Platelet - w/Differential (85025-CBCD)  Patient Instructions: 1)  Take Align capsules 1 daily for 1 month.  Samples provided. 2)  Stay on a bland diet until feeling better, 3-4 days.   3)  We have given you a brochure on Irritable Bowel System. 4)  Take omeprazole 1 tab 30 min before breakfast and supper for 1 month. 5)  Copy sent to : Dr. Macarthur Critchley Plotnikov 6)  The medication list was reviewed and reconciled.  All changed / newly prescribed medications were explained.  A complete medication list was provided to the patient / caregiver.

## 2010-04-24 ENCOUNTER — Telehealth: Payer: Self-pay | Admitting: *Deleted

## 2010-04-24 ENCOUNTER — Other Ambulatory Visit: Payer: Self-pay | Admitting: Internal Medicine

## 2010-04-24 ENCOUNTER — Other Ambulatory Visit: Payer: Medicare Other

## 2010-04-24 DIAGNOSIS — D539 Nutritional anemia, unspecified: Secondary | ICD-10-CM

## 2010-04-24 NOTE — Telephone Encounter (Signed)
Called and reminded patient of labs next week.

## 2010-04-24 NOTE — Telephone Encounter (Signed)
Message copied by Jesse Fall on Mon Apr 24, 2010  3:19 PM ------      Message from: Jesse Fall      Created: Thu Mar 23, 2010  2:16 PM       Patient due for CBC next week for Amy.Call and remind her.

## 2010-04-26 ENCOUNTER — Telehealth: Payer: Self-pay | Admitting: *Deleted

## 2010-04-26 NOTE — Telephone Encounter (Signed)
Pt c/o sinus congestion and allergies. She is req recommendation from MD as what OTC meds are ok.

## 2010-04-28 NOTE — Telephone Encounter (Signed)
Claritn 10 mg a day Thx

## 2010-04-28 NOTE — Telephone Encounter (Signed)
Patient informed. 

## 2010-05-02 ENCOUNTER — Encounter (HOSPITAL_BASED_OUTPATIENT_CLINIC_OR_DEPARTMENT_OTHER): Payer: Medicare Other | Admitting: Oncology

## 2010-05-02 ENCOUNTER — Telehealth: Payer: Self-pay | Admitting: Gastroenterology

## 2010-05-02 ENCOUNTER — Other Ambulatory Visit (INDEPENDENT_AMBULATORY_CARE_PROVIDER_SITE_OTHER): Payer: Medicare Other

## 2010-05-02 DIAGNOSIS — D539 Nutritional anemia, unspecified: Secondary | ICD-10-CM

## 2010-05-02 DIAGNOSIS — C50419 Malignant neoplasm of upper-outer quadrant of unspecified female breast: Secondary | ICD-10-CM

## 2010-05-02 DIAGNOSIS — M129 Arthropathy, unspecified: Secondary | ICD-10-CM

## 2010-05-02 DIAGNOSIS — Z170 Estrogen receptor positive status [ER+]: Secondary | ICD-10-CM

## 2010-05-02 DIAGNOSIS — I10 Essential (primary) hypertension: Secondary | ICD-10-CM

## 2010-05-02 LAB — CBC WITH DIFFERENTIAL/PLATELET
Basophils Relative: 0.6 % (ref 0.0–3.0)
Eosinophils Absolute: 0.2 10*3/uL (ref 0.0–0.7)
HCT: 36.5 % (ref 36.0–46.0)
Hemoglobin: 12.2 g/dL (ref 12.0–15.0)
Lymphocytes Relative: 34.1 % (ref 12.0–46.0)
Lymphs Abs: 3.4 10*3/uL (ref 0.7–4.0)
MCHC: 33.3 g/dL (ref 30.0–36.0)
Monocytes Relative: 7.4 % (ref 3.0–12.0)
Neutro Abs: 5.5 10*3/uL (ref 1.4–7.7)
RBC: 4.1 Mil/uL (ref 3.87–5.11)
RDW: 14.4 % (ref 11.5–14.6)

## 2010-05-02 NOTE — Telephone Encounter (Signed)
Patient reports she is not having good success with Miralax.  She reports she is only having a BM q 5 days and then she gets diarrhea,  She is taking Miralax 2 times a day.  I have asked her to please add a dulcolax tablet every 2-3 days if she has not had a BM.  Patient will try this and call back if she is still having problems.

## 2010-05-02 NOTE — Telephone Encounter (Signed)
Agree 

## 2010-05-04 ENCOUNTER — Other Ambulatory Visit: Payer: Self-pay | Admitting: Gastroenterology

## 2010-05-04 MED ORDER — ALIGN 4 MG PO CAPS: 1.0000 | ORAL_CAPSULE | Freq: Every day | ORAL | Status: DC

## 2010-05-04 NOTE — Telephone Encounter (Signed)
Spoke with patient and told her that I sent Align as a prescription and she can pick it up in a little while. Pt agreed and verbalized understanding.

## 2010-05-21 ENCOUNTER — Other Ambulatory Visit: Payer: Self-pay | Admitting: Gastroenterology

## 2010-05-23 NOTE — Op Note (Signed)
NAME:  Deanna Salazar, Deanna Salazar NO.:  [id number]   MEDICAL RECORD NO.:  [id number]          PATIENT TYPE:  AMB   LOCATION:  DSC                          FACILITY:  [facility]   PHYSICIAN:  Leonie Man, M.D.   DATE OF BIRTH:  07/02/1940   DATE OF PROCEDURE:  03/20/2007  DATE OF DISCHARGE:                               OPERATIVE REPORT   PREOPERATIVE DIAGNOSIS:  Carcinoma of the left breast.   POSTOPERATIVE DIAGNOSIS:  Carcinoma of the left breast.   PROCEDURE:  Sentinel lymph node biopsy, left axilla.   SURGEON:  Leonie Man, M.D.   ASSISTANT:  OR nurse.   ANESTHESIA:  General.   SPECIMENS TO THE LAB:  Sentinel lymph node.   FINDINGS:  Negative sentinel lymph node on touch prep.   ESTIMATED BLOOD LOSS:  Minimal.   COMPLICATIONS:  None.   DISPOSITION:  The patient returned to the PACU in excellent condition.   Deanna Salazar is a 70 year old patient with a left sided breast mass  found on mammogram which, on core biopsy, showed normal breast tissue.  Because of this discordance, she underwent a needle localized excisional  biopsy of this lesion.  On excision, the margins were clear and this  showed an invasive carcinoma.  The patient is returned to the operating  room now for sentinel lymph node biopsy with the possibility of axillary  lymph node dissection to complete the staging. She understands the risks  and potential benefits of surgery and gives her consent.   The patient is positioned supinely following induction of satisfactory  anesthesia and the left breast and axilla are prepped and draped to be  included in a sterile operative field.  Positive identification of the  patient and the side as the left sided axillary lymph node was made.  I  made a transverse incision in the axilla after having injected 4 mL of  diluted methylene blue in the infra-areolar region and massaged this for  approximately five minutes.  The patient had previously undergone  radio  nucleotide injection for the same purpose of identifying the sentinel  lymph node.  The incision was deepened through skin and subcutaneous  tissues down into the axilla.  I used the Neoprobe to locate the area of  highest radioactivity and dissecting in this area, I came upon a large  blue lymph node.  This was dissected free from the surrounding tissues  and forwarded for pathologic evaluation.  No additional sentinel lymph  nodes were identified.  Pathology returned the touch prep results a  being negative for carcinoma.  The sponge and instrument counts were  then verified and the axilla was closed with interrupted 2-0 Vicryl  sutures.  The skin was  closed with running 5-0 Monocryl suture and reinforced with Steri-  Strips.  Sterile dressings were applied.  Anesthetic was reversed.  The  patient was moved from the operating room to the recovery room in stable  condition.  She tolerated the procedure well.      Leonie Man, M.D.  Electronically Signed     PB/MEDQ  D:  03/20/2007  T:  03/21/2007  Job:  [id number]

## 2010-05-23 NOTE — Op Note (Signed)
NAME:  Deanna Salazar, Deanna Salazar NO.:  [id number]   MEDICAL RECORD NO.:  [id number]          PATIENT TYPE:  AMB   LOCATION:  DSC                          FACILITY:  [facility]   PHYSICIAN:  Leonie Man, M.D.   DATE OF BIRTH:  09/02/1940   DATE OF PROCEDURE:  02/28/2007  DATE OF DISCHARGE:                               OPERATIVE REPORT   PREOPERATIVE DIAGNOSIS:  Left breast lesion, rule out carcinoma.   POSTOPERATIVE DIAGNOSIS:  Left breast lesion, rule out carcinoma.   PROCEDURE:  Needle localized left breast excisional biopsy.   SURGEON:  Leonie Man, M.D.   ASSISTANT:  OR nurse.   ANESTHESIA:  General.   SPECIMENS TO LAB:  Left breast tissue.   ESTIMATED BLOOD LOSS:  Minimal.   COMPLICATIONS:  None.   DISPOSITION:  The patient returned to the PACU in excellent condition.   NOTE:  Ms. Camilia Caywood is a 70 year old lady who, on routine screening  mammography, showed a left sided breast lesion at the 2 o'clock axis of  the left breast.  This was biopsied and showed normal breast tissue.  Because of this discordance, the patient is referred for excisional  biopsy of this lesion.  She comes to the operating room now after the  risks and potential benefits of surgery have been discussed, all  questions answered, and consent obtained.   The patient is positioned supinely and following induction of  satisfactory general anesthesia, the left breast was prepped and draped  to be included in a sterile operative field.  Positive identification of  the patient and the side being the left side and the patient being  [name] this was made.  I made a transverse incision in an ellipse  around the localizing needle, deepening this through skin and  subcutaneous tissue, carrying the dissection down within the breast and  dissecting beyond the tip of the localizing needle and down to the chest  wall.  This entire specimen was removed and forwarded for pathologic  evaluation after specimen mammography showed the clip and wire to be  well within the wound.  I then took additional tissue in the medial  margin due to some irregular tissue palpated within the breast.  This  was over the lateral edge of the pectoralis muscle and the serratus  anterior.  This was removed and sent for permanent section, also.  Hemostasis was obtained with electrocautery.  Sponge and instrument  counts were verified. The breast tissues were reapproximated with 2-0  Vicryl, the subcutaneous tissues  closed with 3-0 Vicryl, and the skin closed with 5-0 Monocryl suture and  reinforced with Steri-Strips.  A sterile compressive dressing was  applied.  The anesthetic was reversed.  The patient was removed from the  operating room to the recovery room in stable condition.  She tolerated  the procedure well.      Leonie Man, M.D.  Electronically Signed     PB/MEDQ  D:  02/28/2007  T:  03/01/2007  Job:  [id number]

## 2010-05-23 NOTE — Assessment & Plan Note (Signed)
[facility]                         GASTROENTEROLOGY OFFICE NOTE   Deanna, Salazar                        MRN:          [id number]  DATE:07/17/2006                            DOB:          [birth year]    Deanna Salazar returns today with complaints of ongoing constipation,  worsening reflux symptoms, nausea, and small volume hematochezia.  The  reflux has not been adequately controlled on Nexium 40 mg daily.  She  increased her Nexium to twice a day which helped her reflux symptoms,  but lead to worsening abdominal pain, so she resumed daily dosing.  She  noted intermittent hoarseness and voice fatigue over the past few  months. She has persistent problems with constipation and despite my  recommendations on several occasions in the past, she did not maintain  the regular use of a maintanance laxative.  She has been advised to use  MiraLax once or twice a day as a long term standing medication to  control her constipation, but she is using it on a p.r.n. basis, again,  without adequate results.  She has noted intermittent small volume  hematochezia as well.  She is due for followup colonoscopy at this time  based on her personal history of adenomatous colon polyps and 2 brothers  with colon cancer in their 20s.  She has known internal hemorrhoids from  her most recent colonoscopy.   CURRENT MEDICATIONS:  Listed on the chart, updated and reviewed.   MEDICATION ALLERGIES:  None known.   PHYSICAL EXAMINATION:  No acute distress.  Weight 198.6 pounds, blood pressure is 124/88, pulse 76 and regular.  CHEST:  Clear to auscultation bilaterally.  CARDIAC:  Regular rate and rhythm without murmurs.  ABDOMEN:  Soft with mild epigastric tenderness to deep palpation, no  rebound or guarding, no palpable organomegaly, masses, or hernias.  Normoactive bowel sounds.  RECTAL EXAMINATION:  Deferred to time of colonoscopy.   ASSESSMENT AND PLAN:  1. Chronic  constipation.  She was again advised to remain on MiraLax      once or twice a day as a standing dosage to help prevent      constipation.  She seems to understand this, but she has failed to      follow this advice in the past.  2. Small volume hematochezia.  Personal history of adenomatous colon      polyps and 2 brothers with colon cancer.  Risks, benefits, and      alternatives to colonoscopy, possible biopsy, and possible      polypectomy discussed with the patient.  She consents to proceed.      This will be scheduled electively.  3. Worsening reflux with abdominal pain side effects likley from      Nexium.  Hoarseness and      vocal changes.  DC Nexium. Begin Zegerid 40 mg p.o. b.i.d.  She may      have LPR or other      ENT disorders.  Proceed with ENT referral for further evaluation.      She is to re-intensify all standard anti-reflux measures.  Deanna Salazar. Deanna Dar, MD, [facility]  Electronically Signed    MTS/MedQ  DD: 07/18/2006  DT: 07/18/2006  Job #: [id number]

## 2010-05-26 NOTE — Assessment & Plan Note (Signed)
[facility]                             PRIMARY CARE OFFICE NOTE   MARGAN, ELIAS                        MRN:          [id number]  DATE:10/15/2005                            DOB:          [birth year]    The patient is a 70 year old female who presents for a wellness examination.   Past medical history, family history, social history as per December 27, 2000, note.  Her father had coronary artery disease.  Older sister had  elevated cholesterol.  The patient herself had a thyroid surgery two years  ago.   ALLERGIES:  None.   CURRENT MEDICATIONS:  1. Levoxyl 50 mcg daily.  2. Nexium 40 mg daily.  3. Norvasc 5 mg daily.  4. Prednisone 4 mg 1-1/2 daily.  5. Calcium.   REVIEW OF SYSTEMS:  Sore throat lately with some cough.  Problems with GERD  worse.  Arthritis.  Complains of constipation.  The rest is negative.   PHYSICAL EXAMINATION:  GENERAL:  She is overweight.  She is in no acute  distress.  Looks well.  VITAL SIGNS:  Blood pressure 129/85, pulse 77, temperature 99.6, weight 200  pounds.  HEENT:  Moist mucosa.  NECK:  Supple, no thyromegaly or bruit.  LUNGS:  Clear to auscultation and percussion.  No wheeze or rales.  CARDIAC:  S1, S2, no murmur, no gallop.  ABDOMEN:  Obese, soft, nontender, no organomegaly or mass felt.  EXTREMITIES:  Lower extremities without edema.  Joints without deformities.  NEUROLOGIC:  She is alert and cooperative.  Denies being depressed.  SKIN:  Scar on the neck, by and large well-healed.   LABORATORY DATA:  On October 15, 2005, CBC normal.  Potassium 3.3.  CMET  normal.  Cholesterol 224, LDL 164.  TSH normal.  Urinalysis with 10-20  wbc's.  EKG today without acute changes.   ASSESSMENT AND PLAN:  Normal wellness examination.  Age/health-related  issues discussed.  Healthy lifestyle discussed.  Needs to lose weight.  She  refused all vaccinations.  Colonoscopy due 2008.  Repeat exam in 12 months.  GYN  care with her gynecologist yearly.            ______________________________  Georgina Quint Plotnikov, MD      AVP/MedQ  DD:  10/17/2005  DT:  10/19/2005  Job #:  [id number]

## 2010-05-31 ENCOUNTER — Other Ambulatory Visit: Payer: Self-pay | Admitting: Internal Medicine

## 2010-05-31 DIAGNOSIS — M25512 Pain in left shoulder: Secondary | ICD-10-CM

## 2010-06-03 ENCOUNTER — Ambulatory Visit
Admission: RE | Admit: 2010-06-03 | Discharge: 2010-06-03 | Disposition: A | Discharge status: Home / Self Care | Payer: Medicare Other | Source: Ambulatory Visit | Attending: Internal Medicine | Admitting: Internal Medicine

## 2010-06-03 DIAGNOSIS — M25512 Pain in left shoulder: Secondary | ICD-10-CM

## 2010-06-26 ENCOUNTER — Other Ambulatory Visit: Payer: Self-pay | Admitting: *Deleted

## 2010-06-26 MED ORDER — LOVASTATIN 20 MG PO TABS: 20.0000 mg | ORAL_TABLET | Freq: Every day | ORAL | Status: DC

## 2010-07-04 ENCOUNTER — Other Ambulatory Visit: Payer: Self-pay | Admitting: Internal Medicine

## 2010-07-07 ENCOUNTER — Telehealth: Payer: Self-pay | Admitting: *Deleted

## 2010-07-07 NOTE — Telephone Encounter (Signed)
CBC was OK

## 2010-07-07 NOTE — Telephone Encounter (Signed)
Pt called left msg on vm requesting results back from bloodwork. Pls advise?

## 2010-07-08 NOTE — Telephone Encounter (Signed)
Notified pt with md response.Marland KitchenMarland Kitchen6/30/12@9 :29am/LMB

## 2010-07-18 ENCOUNTER — Encounter: Payer: Self-pay | Admitting: Internal Medicine

## 2010-07-20 ENCOUNTER — Other Ambulatory Visit: Payer: Self-pay | Admitting: Internal Medicine

## 2010-07-21 ENCOUNTER — Encounter: Payer: Self-pay | Admitting: Internal Medicine

## 2010-07-21 ENCOUNTER — Ambulatory Visit (INDEPENDENT_AMBULATORY_CARE_PROVIDER_SITE_OTHER): Payer: Medicare Other | Admitting: Internal Medicine

## 2010-07-21 ENCOUNTER — Other Ambulatory Visit (INDEPENDENT_AMBULATORY_CARE_PROVIDER_SITE_OTHER): Payer: Medicare Other

## 2010-07-21 ENCOUNTER — Other Ambulatory Visit (INDEPENDENT_AMBULATORY_CARE_PROVIDER_SITE_OTHER): Payer: Medicare Other | Admitting: Internal Medicine

## 2010-07-21 DIAGNOSIS — Z0000 Encounter for general adult medical examination without abnormal findings: Secondary | ICD-10-CM

## 2010-07-21 DIAGNOSIS — M069 Rheumatoid arthritis, unspecified: Secondary | ICD-10-CM

## 2010-07-21 DIAGNOSIS — E039 Hypothyroidism, unspecified: Secondary | ICD-10-CM

## 2010-07-21 DIAGNOSIS — M791 Myalgia, unspecified site: Secondary | ICD-10-CM | POA: Insufficient documentation

## 2010-07-21 DIAGNOSIS — I10 Essential (primary) hypertension: Secondary | ICD-10-CM

## 2010-07-21 DIAGNOSIS — IMO0001 Reserved for inherently not codable concepts without codable children: Secondary | ICD-10-CM

## 2010-07-21 DIAGNOSIS — E785 Hyperlipidemia, unspecified: Secondary | ICD-10-CM

## 2010-07-21 LAB — TSH: TSH: 1 u[IU]/mL (ref 0.35–5.50)

## 2010-07-21 LAB — URINALYSIS, ROUTINE W REFLEX MICROSCOPIC
Leukocytes, UA: NEGATIVE
Nitrite: NEGATIVE
Specific Gravity, Urine: 1.025 (ref 1.000–1.030)
Total Protein, Urine: NEGATIVE
pH: 5.5 (ref 5.0–8.0)

## 2010-07-21 LAB — BASIC METABOLIC PANEL
Calcium: 9.9 mg/dL (ref 8.4–10.5)
Chloride: 104 mEq/L (ref 96–112)
Creatinine, Ser: 1.1 mg/dL (ref 0.4–1.2)

## 2010-07-21 NOTE — Patient Instructions (Signed)
Hold Lovastatin x 3-4 wks

## 2010-07-21 NOTE — Assessment & Plan Note (Signed)
Hold Lovastatin due to pains

## 2010-07-21 NOTE — Progress Notes (Signed)
  Subjective:    Patient ID: Deanna Salazar, female    DOB: [birth year], 70 y.o.   MRN: [id number]  HPI  The patient presents for a follow-up of  chronic hypertension, chronic RA and OA, hypothyroidism controlled with medicines. C/o cramps...   Review of Systems  Constitutional: Positive for fatigue. Negative for chills, activity change, appetite change and unexpected weight change.  HENT: Negative for congestion, mouth sores and sinus pressure.   Eyes: Negative for visual disturbance.  Respiratory: Negative for cough and chest tightness.   Gastrointestinal: Negative for nausea and abdominal pain.  Genitourinary: Negative for frequency, difficulty urinating and vaginal pain.  Musculoskeletal: Positive for myalgias, back pain and arthralgias. Negative for gait problem.  Skin: Negative for pallor and rash.  Neurological: Negative for dizziness, tremors, weakness, numbness and headaches.  Psychiatric/Behavioral: Negative for confusion and sleep disturbance.       Objective:   Physical Exam  Constitutional: She appears well-developed and well-nourished. No distress.  HENT:  Head: Normocephalic.  Right Ear: External ear normal.  Left Ear: External ear normal.  Nose: Nose normal.  Mouth/Throat: Oropharynx is clear and moist.  Eyes: Conjunctivae are normal. Pupils are equal, round, and reactive to light. Right eye exhibits no discharge. Left eye exhibits no discharge.  Neck: Normal range of motion. Neck supple. No JVD present. No tracheal deviation present. No thyromegaly present.  Cardiovascular: Normal rate, regular rhythm and normal heart sounds.   Pulmonary/Chest: No stridor. No respiratory distress. She has no wheezes.  Abdominal: Soft. Bowel sounds are normal. She exhibits no distension and no mass. There is no tenderness. There is no rebound and no guarding.  Musculoskeletal: She exhibits no edema and no tenderness.  Lymphadenopathy:    She has no cervical adenopathy.    Neurological: She displays normal reflexes. No cranial nerve deficit. She exhibits normal muscle tone. Coordination normal.  Skin: No rash noted. No erythema.  Psychiatric: She has a normal mood and affect. Her behavior is normal. Judgment and thought content normal.   L shoulder hurts w/ROM - post-op       Assessment & Plan:

## 2010-07-21 NOTE — Assessment & Plan Note (Signed)
On Rx 

## 2010-07-21 NOTE — Assessment & Plan Note (Signed)
Hold Lovastatin 

## 2010-07-21 NOTE — Assessment & Plan Note (Signed)
On Predn 4 mg/d

## 2010-09-29 LAB — CBC
RBC: 3.88
WBC: 7.4

## 2010-09-29 LAB — DIFFERENTIAL
Eosinophils Absolute: 0
Eosinophils Relative: 1
Lymphs Abs: 1.2

## 2010-09-29 LAB — COMPREHENSIVE METABOLIC PANEL
ALT: 22
AST: 32
Alkaline Phosphatase: 89
CO2: 29
Calcium: 9.4
Chloride: 105
GFR calc Af Amer: 50 — ABNORMAL LOW
GFR calc non Af Amer: 42 — ABNORMAL LOW
Potassium: 3.7
Sodium: 143
Total Bilirubin: 0.7

## 2010-10-02 LAB — BASIC METABOLIC PANEL
CO2: 31
Calcium: 9.8
Creatinine, Ser: 0.94
GFR calc Af Amer: >60

## 2010-10-06 ENCOUNTER — Ambulatory Visit (INDEPENDENT_AMBULATORY_CARE_PROVIDER_SITE_OTHER): Payer: Medicare Other | Admitting: Internal Medicine

## 2010-10-06 ENCOUNTER — Encounter: Payer: Self-pay | Admitting: Internal Medicine

## 2010-10-06 DIAGNOSIS — J40 Bronchitis, not specified as acute or chronic: Secondary | ICD-10-CM

## 2010-10-06 DIAGNOSIS — M791 Myalgia, unspecified site: Secondary | ICD-10-CM

## 2010-10-06 DIAGNOSIS — IMO0001 Reserved for inherently not codable concepts without codable children: Secondary | ICD-10-CM

## 2010-10-06 DIAGNOSIS — I10 Essential (primary) hypertension: Secondary | ICD-10-CM

## 2010-10-06 MED ORDER — AZITHROMYCIN 250 MG PO TABS: ORAL_TABLET | ORAL | Status: AC

## 2010-10-06 MED ORDER — PROMETHAZINE-CODEINE 6.25-10 MG/5ML PO SYRP: 5.0000 mL | ORAL_SOLUTION | ORAL | Status: AC | PRN

## 2010-10-06 NOTE — Assessment & Plan Note (Signed)
Prom/cod short term Zpac Declined CXR

## 2010-10-06 NOTE — Progress Notes (Signed)
  Subjective:    Patient ID: Deanna Salazar, female    DOB: 09/11/1940, 70 y.o.   MRN: [id number]  HPI   HPI  C/o URI sx's x  6 days. C/o ST, cough, weakness. Not better with OTC medicines. Actually, the patient is getting worse. The patient did not sleep last night due to cough. F/u myalgia and HTN  Review of Systems  Constitutional: Positive for fever, chills and fatigue.  HENT: Positive for congestion, rhinorrhea, sneezing and postnasal drip.   Eyes: Positive for photophobia and pain. Negative for discharge and visual disturbance.  Respiratory: Positive for cough and wheezing.   Positive for chest pain.  Gastrointestinal: Negative for vomiting, abdominal pain, diarrhea and abdominal distention.  Genitourinary: Negative for dysuria and difficulty urinating.  Skin: Negative for rash.  Neurological: Positive for dizziness, weakness and light-headedness.      Review of Systems     Objective:   Physical Exam  Constitutional: She appears well-developed. No distress.       tired  HENT:  Head: Normocephalic.  Right Ear: External ear normal.  Left Ear: External ear normal.  Nose: Nose normal.  Mouth/Throat: Oropharynx is clear and moist.  Eyes: Conjunctivae are normal. Pupils are equal, round, and reactive to light. Right eye exhibits no discharge. Left eye exhibits no discharge.  Neck: Normal range of motion. Neck supple. No JVD present. No tracheal deviation present. No thyromegaly present.  Cardiovascular: Normal rate, regular rhythm and normal heart sounds.   Pulmonary/Chest: No stridor. No respiratory distress. She has no wheezes. She has no rales. She exhibits no tenderness.  Abdominal: Soft. Bowel sounds are normal. She exhibits no distension and no mass. There is no tenderness. There is no rebound and no guarding.  Musculoskeletal: She exhibits no edema and no tenderness.  Lymphadenopathy:    She has no cervical adenopathy.  Neurological: She displays normal reflexes. No  cranial nerve deficit. She exhibits normal muscle tone. Coordination normal.  Skin: No rash noted. No erythema.  Psychiatric: She has a normal mood and affect. Her behavior is normal. Judgment and thought content normal.       subdued          Assessment & Plan:

## 2010-10-06 NOTE — Assessment & Plan Note (Signed)
Resolved off statin. 

## 2010-10-06 NOTE — Patient Instructions (Signed)
Use over-the-counter  "cold" medicines  such as "Tylenol cold" , "Advil cold",  "Mucinex" or" Mucinex D"  for cough and congestion.   Avoid decongestants if you have high blood pressure and use "Afrin" nasal spray for nasal congestion as directed instead. Use" Delsym" or" Robitussin" cough syrup varietis for cough.  You can use plain "Tylenol" or "Advil" for fever, chills and achyness.  

## 2010-10-06 NOTE — Assessment & Plan Note (Signed)
BP Readings from Last 3 Encounters:  10/06/10 152/100  07/21/10 112/96  03/23/10 122/78  Will watch

## 2010-10-07 ENCOUNTER — Other Ambulatory Visit: Payer: Self-pay | Admitting: Internal Medicine

## 2010-10-10 ENCOUNTER — Other Ambulatory Visit: Payer: Self-pay | Admitting: Internal Medicine

## 2010-10-10 ENCOUNTER — Other Ambulatory Visit: Payer: Self-pay | Admitting: *Deleted

## 2010-10-10 MED ORDER — POTASSIUM CHLORIDE 10 MEQ PO TBCR: 10.0000 meq | EXTENDED_RELEASE_TABLET | Freq: Every day | ORAL | Status: DC

## 2010-10-30 ENCOUNTER — Other Ambulatory Visit (INDEPENDENT_AMBULATORY_CARE_PROVIDER_SITE_OTHER): Payer: Medicare Other

## 2010-10-30 ENCOUNTER — Ambulatory Visit (INDEPENDENT_AMBULATORY_CARE_PROVIDER_SITE_OTHER): Payer: Medicare Other | Admitting: Internal Medicine

## 2010-10-30 ENCOUNTER — Encounter: Payer: Self-pay | Admitting: Internal Medicine

## 2010-10-30 ENCOUNTER — Telehealth: Payer: Self-pay | Admitting: Internal Medicine

## 2010-10-30 VITALS — BP 148/82 | HR 70 | Temp 97.3°F | Wt 212.0 lb

## 2010-10-30 DIAGNOSIS — R252 Cramp and spasm: Secondary | ICD-10-CM | POA: Insufficient documentation

## 2010-10-30 DIAGNOSIS — I10 Essential (primary) hypertension: Secondary | ICD-10-CM

## 2010-10-30 DIAGNOSIS — M791 Myalgia, unspecified site: Secondary | ICD-10-CM

## 2010-10-30 DIAGNOSIS — R0600 Dyspnea, unspecified: Secondary | ICD-10-CM | POA: Insufficient documentation

## 2010-10-30 DIAGNOSIS — R739 Hyperglycemia, unspecified: Secondary | ICD-10-CM

## 2010-10-30 DIAGNOSIS — R609 Edema, unspecified: Secondary | ICD-10-CM

## 2010-10-30 DIAGNOSIS — R0989 Other specified symptoms and signs involving the circulatory and respiratory systems: Secondary | ICD-10-CM

## 2010-10-30 DIAGNOSIS — IMO0001 Reserved for inherently not codable concepts without codable children: Secondary | ICD-10-CM

## 2010-10-30 DIAGNOSIS — M069 Rheumatoid arthritis, unspecified: Secondary | ICD-10-CM

## 2010-10-30 DIAGNOSIS — Z23 Encounter for immunization: Secondary | ICD-10-CM

## 2010-10-30 DIAGNOSIS — R7309 Other abnormal glucose: Secondary | ICD-10-CM

## 2010-10-30 DIAGNOSIS — E785 Hyperlipidemia, unspecified: Secondary | ICD-10-CM

## 2010-10-30 DIAGNOSIS — J40 Bronchitis, not specified as acute or chronic: Secondary | ICD-10-CM

## 2010-10-30 DIAGNOSIS — E039 Hypothyroidism, unspecified: Secondary | ICD-10-CM

## 2010-10-30 LAB — BASIC METABOLIC PANEL
BUN: 7 mg/dL (ref 6–23)
Chloride: 103 mEq/L (ref 96–112)
Potassium: 4.2 mEq/L (ref 3.5–5.1)
Sodium: 142 mEq/L (ref 135–145)

## 2010-10-30 LAB — TSH: TSH: 0.99 u[IU]/mL (ref 0.35–5.50)

## 2010-10-30 LAB — COMPREHENSIVE METABOLIC PANEL
Alkaline Phosphatase: 83 U/L (ref 39–117)
Creatinine, Ser: 0.8 mg/dL (ref 0.4–1.2)
Glucose, Bld: 123 mg/dL — ABNORMAL HIGH (ref 70–99)
Sodium: 142 mEq/L (ref 135–145)
Total Bilirubin: 0.7 mg/dL (ref 0.3–1.2)
Total Protein: 7.3 g/dL (ref 6.0–8.3)

## 2010-10-30 LAB — CK: Total CK: 381 U/L — ABNORMAL HIGH (ref 7–177)

## 2010-10-30 MED ORDER — BUDESONIDE-FORMOTEROL FUMARATE 160-4.5 MCG/ACT IN AERO: 2.0000 | INHALATION_SPRAY | Freq: Two times a day (BID) | RESPIRATORY_TRACT | Status: DC

## 2010-10-30 MED ORDER — VALSARTAN 320 MG PO TABS: 320.0000 mg | ORAL_TABLET | Freq: Every day | ORAL | Status: DC

## 2010-10-30 NOTE — Assessment & Plan Note (Addendum)
Cut back on Amlodipine - 2.5 mg a day

## 2010-10-30 NOTE — Telephone Encounter (Signed)
Deanna Salazar, please, inform patient that all labs are normal except for elev CK. Do not take chol med (she stopped already) Thx

## 2010-10-30 NOTE — Progress Notes (Signed)
  Subjective:    Patient ID: Deanna Salazar, female    DOB: 22-Aug-1940, 70 y.o.   MRN: [id number]  HPI  C/o chest discomfort and breathing tightness at times since her last URI. C/o L ankle swelling and pain. F/u HTN and elev CBG  Review of Systems  Constitutional: Negative.  Negative for fever, chills, diaphoresis, activity change, appetite change, fatigue and unexpected weight change.  HENT: Negative for hearing loss, ear pain, nosebleeds, congestion, sore throat, facial swelling, rhinorrhea, sneezing, mouth sores, trouble swallowing, neck pain, neck stiffness, postnasal drip, sinus pressure and tinnitus.   Eyes: Negative for pain, discharge, redness, itching and visual disturbance.  Respiratory: Negative for cough, chest tightness, shortness of breath, wheezing and stridor.   Cardiovascular: Negative for chest pain, palpitations and leg swelling.  Gastrointestinal: Negative for nausea, diarrhea, constipation, blood in stool, abdominal distention, anal bleeding and rectal pain.  Genitourinary: Negative for dysuria, urgency, frequency, hematuria, flank pain, vaginal bleeding, vaginal discharge, difficulty urinating, genital sores and pelvic pain.  Musculoskeletal: Negative for back pain, joint swelling, arthralgias and gait problem.  Skin: Negative.  Negative for rash.  Neurological: Negative for dizziness, tremors, seizures, syncope, speech difficulty, weakness, numbness and headaches.  Hematological: Negative for adenopathy. Does not bruise/bleed easily.  Psychiatric/Behavioral: Negative for suicidal ideas, behavioral problems, sleep disturbance, dysphoric mood and decreased concentration. The patient is not nervous/anxious.        Objective:   Physical Exam  Constitutional: She appears well-developed. No distress.       obese  HENT:  Head: Normocephalic.  Right Ear: External ear normal.  Left Ear: External ear normal.  Nose: Nose normal.  Mouth/Throat: Oropharynx is clear and  moist.  Eyes: Conjunctivae are normal. Pupils are equal, round, and reactive to light. Right eye exhibits no discharge. Left eye exhibits no discharge.  Neck: Normal range of motion. Neck supple. No JVD present. No tracheal deviation present. No thyromegaly present.  Cardiovascular: Normal rate, regular rhythm and normal heart sounds.   Pulmonary/Chest: No stridor. No respiratory distress. She has no wheezes.  Abdominal: Soft. Bowel sounds are normal. She exhibits no distension and no mass. There is no tenderness. There is no rebound and no guarding.  Musculoskeletal: She exhibits no edema and no tenderness.  Lymphadenopathy:    She has no cervical adenopathy.  Neurological: She displays normal reflexes. No cranial nerve deficit. She exhibits normal muscle tone. Coordination normal.  Skin: No rash noted. No erythema.  Psychiatric: She has a normal mood and affect. Her behavior is normal. Judgment and thought content normal.     I personally provided the inhaler use teaching. After the teaching patient was able to demonstrate it's use effectively. All questions were answered      Assessment & Plan:

## 2010-10-30 NOTE — Assessment & Plan Note (Signed)
Post-bronchitic cough: start Symbicort MDI

## 2010-10-30 NOTE — Assessment & Plan Note (Signed)
Start Symbicort

## 2010-10-30 NOTE — Assessment & Plan Note (Signed)
Continue with current prescription therapy as reflected on the Med list.  

## 2010-10-30 NOTE — Assessment & Plan Note (Signed)
D/c Lovastatin

## 2010-10-30 NOTE — Patient Instructions (Signed)
Take Amlodipine 1/2 tab a day Start Valsartan 1 a day

## 2010-10-31 NOTE — Telephone Encounter (Signed)
Pt informed

## 2010-11-03 ENCOUNTER — Other Ambulatory Visit: Payer: Self-pay | Admitting: Oncology

## 2010-11-03 ENCOUNTER — Telehealth: Payer: Self-pay | Admitting: Oncology

## 2010-11-03 ENCOUNTER — Encounter (HOSPITAL_BASED_OUTPATIENT_CLINIC_OR_DEPARTMENT_OTHER): Payer: Medicare Other | Admitting: Oncology

## 2010-11-03 DIAGNOSIS — N644 Mastodynia: Secondary | ICD-10-CM

## 2010-11-03 DIAGNOSIS — N6459 Other signs and symptoms in breast: Secondary | ICD-10-CM

## 2010-11-03 DIAGNOSIS — I10 Essential (primary) hypertension: Secondary | ICD-10-CM

## 2010-11-03 DIAGNOSIS — C50419 Malignant neoplasm of upper-outer quadrant of unspecified female breast: Secondary | ICD-10-CM

## 2010-11-03 DIAGNOSIS — Z170 Estrogen receptor positive status [ER+]: Secondary | ICD-10-CM

## 2010-11-03 LAB — COMPREHENSIVE METABOLIC PANEL
CO2: 27 mEq/L (ref 19–32)
Calcium: 10 mg/dL (ref 8.4–10.5)
Chloride: 104 mEq/L (ref 96–112)
Creatinine, Ser: 0.89 mg/dL (ref 0.50–1.10)
Glucose, Bld: 144 mg/dL — ABNORMAL HIGH (ref 70–99)
Total Bilirubin: 0.4 mg/dL (ref 0.3–1.2)

## 2010-11-03 LAB — CBC WITH DIFFERENTIAL/PLATELET
Basophils Absolute: 0 10*3/uL (ref 0.0–0.1)
Eosinophils Absolute: 0.3 10*3/uL (ref 0.0–0.5)
HCT: 35.2 % (ref 34.8–46.6)
HGB: 11.6 g/dL (ref 11.6–15.9)
LYMPH%: 35.4 % (ref 14.0–49.7)
MCHC: 33 g/dL (ref 31.5–36.0)
MONO#: 0.6 10*3/uL (ref 0.1–0.9)
NEUT#: 2.2 10*3/uL (ref 1.5–6.5)
NEUT%: 45.6 % (ref 38.4–76.8)
Platelets: 207 10*3/uL (ref 145–400)
WBC: 4.9 10*3/uL (ref 3.9–10.3)
lymph#: 1.7 10*3/uL (ref 0.9–3.3)

## 2010-11-03 LAB — LACTATE DEHYDROGENASE: LDH: 220 U/L (ref 94–250)

## 2010-11-03 NOTE — Telephone Encounter (Signed)
gv pt appt for 11/8 mammo. Pt was also put on wait list to be called if any is available b4 11/8. Pt aware she will be contacted re April appt w/BS. BS template not available.

## 2010-11-07 ENCOUNTER — Other Ambulatory Visit: Payer: Self-pay | Admitting: Gastroenterology

## 2010-11-13 ENCOUNTER — Ambulatory Visit
Admission: RE | Admit: 2010-11-13 | Discharge: 2010-11-13 | Disposition: A | Discharge status: Home / Self Care | Payer: Medicare Other | Source: Ambulatory Visit | Attending: Oncology | Admitting: Oncology

## 2010-11-13 DIAGNOSIS — N644 Mastodynia: Secondary | ICD-10-CM

## 2010-11-23 ENCOUNTER — Other Ambulatory Visit: Payer: Self-pay | Admitting: Gastroenterology

## 2010-12-11 ENCOUNTER — Telehealth: Payer: Self-pay | Admitting: *Deleted

## 2010-12-11 NOTE — Telephone Encounter (Signed)
Patient has questions concerning new BP medication--Diovan 320 mg

## 2010-12-14 NOTE — Telephone Encounter (Signed)
Pt states that new BP medication Diovan is not costwise and requesting alternative.

## 2010-12-14 NOTE — Telephone Encounter (Signed)
D/c Diovan Start Cozaar 100 mg po qd #90 3 ref Thx

## 2010-12-15 MED ORDER — LOSARTAN POTASSIUM 100 MG PO TABS: 100.0000 mg | ORAL_TABLET | Freq: Every day | ORAL | Status: DC

## 2010-12-19 ENCOUNTER — Other Ambulatory Visit: Payer: Self-pay | Admitting: Oncology

## 2010-12-19 DIAGNOSIS — Z853 Personal history of malignant neoplasm of breast: Secondary | ICD-10-CM

## 2011-01-04 ENCOUNTER — Ambulatory Visit
Admission: RE | Admit: 2011-01-04 | Discharge: 2011-01-04 | Disposition: A | Discharge status: Home / Self Care | Payer: Medicare Other | Source: Ambulatory Visit | Attending: Oncology | Admitting: Oncology

## 2011-01-04 DIAGNOSIS — Z853 Personal history of malignant neoplasm of breast: Secondary | ICD-10-CM

## 2011-03-02 ENCOUNTER — Encounter: Payer: Self-pay | Admitting: Internal Medicine

## 2011-03-02 ENCOUNTER — Other Ambulatory Visit: Payer: Medicare Other

## 2011-03-02 ENCOUNTER — Ambulatory Visit (INDEPENDENT_AMBULATORY_CARE_PROVIDER_SITE_OTHER): Payer: Medicare Other | Admitting: Internal Medicine

## 2011-03-02 VITALS — BP 130/80 | HR 80 | Temp 99.2°F | Resp 16 | Wt 210.0 lb

## 2011-03-02 DIAGNOSIS — J069 Acute upper respiratory infection, unspecified: Secondary | ICD-10-CM

## 2011-03-02 DIAGNOSIS — I10 Essential (primary) hypertension: Secondary | ICD-10-CM

## 2011-03-02 DIAGNOSIS — R739 Hyperglycemia, unspecified: Secondary | ICD-10-CM | POA: Insufficient documentation

## 2011-03-02 DIAGNOSIS — R7309 Other abnormal glucose: Secondary | ICD-10-CM

## 2011-03-02 DIAGNOSIS — E876 Hypokalemia: Secondary | ICD-10-CM

## 2011-03-02 DIAGNOSIS — R109 Unspecified abdominal pain: Secondary | ICD-10-CM

## 2011-03-02 LAB — URINALYSIS, ROUTINE W REFLEX MICROSCOPIC
Bilirubin Urine: NEGATIVE
Ketones, ur: NEGATIVE
Leukocytes, UA: NEGATIVE
Nitrite: NEGATIVE
Specific Gravity, Urine: <=1.005 (ref 1.000–1.030)
Urobilinogen, UA: 0.2 (ref 0.0–1.0)
pH: 6 (ref 5.0–8.0)

## 2011-03-02 NOTE — Progress Notes (Signed)
Patient ID: Deanna Salazar, female   DOB: 20-Jun-1940, 71 y.o.   MRN: [id number]  Subjective:    Patient ID: Deanna Salazar, female    DOB: 1940-12-23, 71 y.o.   MRN: [id number]  Chest Pain  Associated symptoms include a cough. Pertinent negatives include no back pain, diaphoresis, dizziness, fever, headaches, nausea, numbness, palpitations, shortness of breath or weakness.  Pertinent negatives for past medical history include no seizures.  Flank Pain Associated symptoms include chest pain. Pertinent negatives include no dysuria, fever, headaches, numbness, pelvic pain or weakness.  Cough Associated symptoms include chest pain. Pertinent negatives include no chills, ear pain, eye redness, fever, headaches, postnasal drip, rash, rhinorrhea, sore throat, shortness of breath or wheezing.  C/o B flank pain x 2 d. C/o ST, voice loss x 1 d. She had some IBS sx's since Mon. Tylenol helped.  C/o chest discomfort and breathing tightness at times since her last URI. C/o L ankle swelling and pain. F/u HTN and elev CBG  Review of Systems  Constitutional: Negative.  Negative for fever, chills, diaphoresis, activity change, appetite change, fatigue and unexpected weight change.  HENT: Negative for hearing loss, ear pain, nosebleeds, congestion, sore throat, facial swelling, rhinorrhea, sneezing, mouth sores, trouble swallowing, neck pain, neck stiffness, postnasal drip, sinus pressure and tinnitus.   Eyes: Negative for pain, discharge, redness, itching and visual disturbance.  Respiratory: Positive for cough. Negative for chest tightness, shortness of breath, wheezing and stridor.   Cardiovascular: Positive for chest pain. Negative for palpitations and leg swelling.  Gastrointestinal: Negative for nausea, diarrhea, constipation, blood in stool, abdominal distention, anal bleeding and rectal pain.  Genitourinary: Positive for flank pain. Negative for dysuria, urgency, frequency, hematuria, vaginal bleeding,  vaginal discharge, difficulty urinating, genital sores and pelvic pain.  Musculoskeletal: Negative for back pain, joint swelling, arthralgias and gait problem.  Skin: Negative.  Negative for rash.  Neurological: Negative for dizziness, tremors, seizures, syncope, speech difficulty, weakness, numbness and headaches.  Hematological: Negative for adenopathy. Does not bruise/bleed easily.  Psychiatric/Behavioral: Negative for suicidal ideas, behavioral problems, sleep disturbance, dysphoric mood and decreased concentration. The patient is not nervous/anxious.        Objective:   Physical Exam  Constitutional: She appears well-developed. No distress.       obese  HENT:  Head: Normocephalic.  Right Ear: External ear normal.  Left Ear: External ear normal.  Nose: Nose normal.  Mouth/Throat: Oropharynx is clear and moist.  Eyes: Conjunctivae are normal. Pupils are equal, round, and reactive to light. Right eye exhibits no discharge. Left eye exhibits no discharge.  Neck: Normal range of motion. Neck supple. No JVD present. No tracheal deviation present. No thyromegaly present.  Cardiovascular: Normal rate, regular rhythm and normal heart sounds.   Pulmonary/Chest: No stridor. No respiratory distress. She has no wheezes.  Abdominal: Soft. Bowel sounds are normal. She exhibits no distension and no mass. There is no tenderness. There is no rebound and no guarding.  Musculoskeletal: She exhibits no edema and no tenderness.  Lymphadenopathy:    She has no cervical adenopathy.  Neurological: She displays normal reflexes. No cranial nerve deficit. She exhibits normal muscle tone. Coordination normal.  Skin: No rash noted. No erythema.  Psychiatric: She has a normal mood and affect. Her behavior is normal. Judgment and thought content normal.     I personally provided the inhaler use teaching. After the teaching patient was able to demonstrate it's use effectively. All questions were answered  Assessment & Plan:

## 2011-03-02 NOTE — Assessment & Plan Note (Signed)
Naprelan 500 mg bid UA

## 2011-03-02 NOTE — Assessment & Plan Note (Signed)
Continue with current prescription therapy as reflected on the Med list.  

## 2011-03-02 NOTE — Assessment & Plan Note (Signed)
2012 Glucometer given Declined tests

## 2011-03-02 NOTE — Patient Instructions (Signed)
Naprelan 1 twice a day with food

## 2011-03-02 NOTE — Assessment & Plan Note (Signed)
OTC meds 

## 2011-03-15 ENCOUNTER — Telehealth: Payer: Self-pay | Admitting: *Deleted

## 2011-03-15 NOTE — Telephone Encounter (Signed)
Pt left vm stating she was just seen and advised Dr. Posey Rea she felt like she was getting sick. She thinks she has a URI and bronchitis. She is requesting meds or advisement.

## 2011-03-15 NOTE — Telephone Encounter (Signed)
Pt last seen feb 22  Consider OV as this is likely a new issue not addressed last visit, and not a chronic issue, and as per office policy, we normally do not prescribe antibx without ov

## 2011-03-16 NOTE — Telephone Encounter (Signed)
Pt informed. She states she doesn't have money to sched OV. She states she will call back Monday if she is no better.

## 2011-03-20 ENCOUNTER — Telehealth: Payer: Self-pay

## 2011-03-20 MED ORDER — BUDESONIDE-FORMOTEROL FUMARATE 160-4.5 MCG/ACT IN AERO: 2.0000 | INHALATION_SPRAY | Freq: Two times a day (BID) | RESPIRATORY_TRACT | Status: DC

## 2011-03-20 NOTE — Telephone Encounter (Signed)
Paitent called triage lmovm stating that she is unable to afford symbicort inhaler and would like samples. Thanks

## 2011-03-20 NOTE — Telephone Encounter (Signed)
Pt informed samples up front 

## 2011-03-23 ENCOUNTER — Other Ambulatory Visit: Payer: Self-pay | Admitting: *Deleted

## 2011-03-23 MED ORDER — LEVOTHYROXINE SODIUM 50 MCG PO TABS: 50.0000 ug | ORAL_TABLET | Freq: Every day | ORAL | Status: DC

## 2011-03-26 ENCOUNTER — Other Ambulatory Visit: Payer: Self-pay | Admitting: Oncology

## 2011-03-26 DIAGNOSIS — Z170 Estrogen receptor positive status [ER+]: Secondary | ICD-10-CM

## 2011-03-26 DIAGNOSIS — C50419 Malignant neoplasm of upper-outer quadrant of unspecified female breast: Secondary | ICD-10-CM

## 2011-04-09 ENCOUNTER — Telehealth: Payer: Self-pay | Admitting: Oncology

## 2011-04-09 NOTE — Telephone Encounter (Signed)
S/w pt re appt for 5/10 

## 2011-04-29 ENCOUNTER — Other Ambulatory Visit: Payer: Self-pay | Admitting: Gastroenterology

## 2011-05-08 ENCOUNTER — Telehealth: Payer: Self-pay | Admitting: Gastroenterology

## 2011-05-08 NOTE — Telephone Encounter (Signed)
Patient is having reflux symptoms.  She has not been seen in the office in several years.  I have scheduled her an office visit for 05/24/11

## 2011-05-09 ENCOUNTER — Other Ambulatory Visit: Payer: Self-pay | Admitting: Gastroenterology

## 2011-05-09 NOTE — Telephone Encounter (Signed)
NEEDS OFFICE VISIT FOR ANY FURTHER REFILLS! 

## 2011-05-18 ENCOUNTER — Ambulatory Visit (HOSPITAL_BASED_OUTPATIENT_CLINIC_OR_DEPARTMENT_OTHER): Payer: Medicare Other | Admitting: Oncology

## 2011-05-18 ENCOUNTER — Telehealth: Payer: Self-pay | Admitting: Oncology

## 2011-05-18 VITALS — BP 148/84 | HR 98 | Temp 98.0°F | Ht 63.0 in | Wt 213.8 lb

## 2011-05-18 DIAGNOSIS — Z79811 Long term (current) use of aromatase inhibitors: Secondary | ICD-10-CM

## 2011-05-18 DIAGNOSIS — C50919 Malignant neoplasm of unspecified site of unspecified female breast: Secondary | ICD-10-CM

## 2011-05-18 NOTE — Progress Notes (Signed)
   [facility]    OFFICE PROGRESS NOTE   INTERVAL HISTORY:   She returns as scheduled. She continues Arimidex. She reports left shoulder pain. The pain persists despite shoulder surgery last year. Stable arthralgias. Minimal hot flashes. No change in either breast. A bilateral mammogram on 01/04/2011 was negative. She reports mild edema in the left greater than right lower leg since returning from a trip to [place] 3-4 weeks ago. No pain.  Objective:  Vital signs in last 24 hours:  Blood pressure 148/84, pulse 98, temperature 98 F (36.7 C), temperature source Oral, height 5\' 3"  (1.6 m), weight 213 lb 12.8 oz (96.979 kg).    HEENT: Neck without mass Lymphatics: No cervical, supraclavicular, or axillary nodes Resp: Lungs clear bilaterally Cardio: Regular rate and rhythm GI: No hepatomegaly Vascular: No apparent leg edema. No erythema Breast: Status post left lumpectomy. No evidence for local tumor recurrence. No mass in either breast.    Medications: I have reviewed the patient's current medications.  Assessment/Plan: 1. Stage I left-sided breast cancer, status post left lumpectomy and sentinel lymph node biopsy followed by left breast radiation.  She began Arimidex in early July 2009.. 2. History of hypertension. 3. Arthritis. Chronic. 4.     Status post partial thyroidectomy  Disposition:  She remains in clinical remission from breast cancer. She will continue Arimidex. She will be scheduled for a mammogram in December of 2013. Ms. Giuliano will return for an office visit in 9 months. She will followup with Dr. Jennette Banker cough for evaluation of persistent swelling or discomfort at the left lower leg. I have a low clinical suspicion for a deep vein thrombosis today.   Thornton Papas, MD  05/18/2011  3:31 PM

## 2011-05-18 NOTE — Telephone Encounter (Signed)
appts made and printed for pt aom °

## 2011-05-23 ENCOUNTER — Encounter: Payer: Self-pay | Admitting: Gastroenterology

## 2011-05-23 ENCOUNTER — Ambulatory Visit (INDEPENDENT_AMBULATORY_CARE_PROVIDER_SITE_OTHER): Payer: Medicare Other | Admitting: Gastroenterology

## 2011-05-23 VITALS — BP 160/84 | HR 68 | Ht 63.0 in | Wt 215.5 lb

## 2011-05-23 DIAGNOSIS — K5900 Constipation, unspecified: Secondary | ICD-10-CM

## 2011-05-23 DIAGNOSIS — Z86010 Personal history of colonic polyps: Secondary | ICD-10-CM

## 2011-05-23 DIAGNOSIS — K219 Gastro-esophageal reflux disease without esophagitis: Secondary | ICD-10-CM

## 2011-05-23 MED ORDER — ALIGN 4 MG PO CAPS: 1.0000 | ORAL_CAPSULE | Freq: Every day | ORAL | Status: DC

## 2011-05-23 MED ORDER — OMEPRAZOLE 40 MG PO CPDR: 40.0000 mg | DELAYED_RELEASE_CAPSULE | Freq: Two times a day (BID) | ORAL | Status: DC

## 2011-05-23 NOTE — Progress Notes (Signed)
History of Present Illness: This is a 71 year old female who relates were worsening problems with heartburn mainly at night and worsening constipation. She takes omeprazole 40 mg daily and MiraLax on an as needed basis. She underwent upper endoscopy in 2010 was unremarkable, esophageal manometry in 2011 was normal. Colonoscopy 2011 showed small adenomatous colon polyps. Denies weight loss, abdominal pain, diarrhea, change in stool caliber, melena, hematochezia, nausea, vomiting, dysphagia, chest pain.  Current Medications, Allergies, Past Medical History, Past Surgical History, Family History and Social History were reviewed in [employer] record.  Physical Exam: General: Well developed , well nourished, no acute distress Head: Normocephalic and atraumatic Eyes:  sclerae anicteric, EOMI Ears: Normal auditory acuity Mouth: No deformity or lesions Lungs: Clear throughout to auscultation Heart: Regular rate and rhythm; no murmurs, rubs or bruits Abdomen: Soft, non tender and non distended. No masses, hepatosplenomegaly or hernias noted. Normal Bowel sounds Musculoskeletal: Symmetrical with no gross deformities  Pulses:  Normal pulses noted Extremities: No clubbing, cyanosis, edema or deformities noted Neurological: Alert oriented x 4, grossly nonfocal Psychological:  Alert and cooperative. Normal mood and affect  Assessment and Recommendations:  1. Constipation. Change to MiraLax from needed to a regular daily schedule of 1 to 3 times every day, titrated for adequate bowel movements. She relates that probiotics were also helpful. Resume Align daily.  2. GERD. Intensify antireflux measures. Increase Prilosec to 40 mg twice daily.  3. History of adenomatous colon polyps. Surveillance colonoscopy due July 2014.

## 2011-05-23 NOTE — Patient Instructions (Addendum)
Increase taking your omeprazole 40 mg one tablet by twice daily. A prescription has been sent to your pharmacy.  Take your Miralax twice daily every day. Align samples were given for you to take for 1-2 weeks.

## 2011-07-11 ENCOUNTER — Other Ambulatory Visit (INDEPENDENT_AMBULATORY_CARE_PROVIDER_SITE_OTHER): Payer: Medicare Other

## 2011-07-11 ENCOUNTER — Encounter: Payer: Self-pay | Admitting: Internal Medicine

## 2011-07-11 ENCOUNTER — Ambulatory Visit (INDEPENDENT_AMBULATORY_CARE_PROVIDER_SITE_OTHER): Payer: Medicare Other | Admitting: Internal Medicine

## 2011-07-11 VITALS — BP 140/90 | HR 76 | Temp 97.0°F | Resp 16 | Wt 208.0 lb

## 2011-07-11 DIAGNOSIS — C50919 Malignant neoplasm of unspecified site of unspecified female breast: Secondary | ICD-10-CM

## 2011-07-11 DIAGNOSIS — E785 Hyperlipidemia, unspecified: Secondary | ICD-10-CM

## 2011-07-11 DIAGNOSIS — R609 Edema, unspecified: Secondary | ICD-10-CM

## 2011-07-11 DIAGNOSIS — R7309 Other abnormal glucose: Secondary | ICD-10-CM

## 2011-07-11 DIAGNOSIS — E039 Hypothyroidism, unspecified: Secondary | ICD-10-CM

## 2011-07-11 DIAGNOSIS — R079 Chest pain, unspecified: Secondary | ICD-10-CM

## 2011-07-11 DIAGNOSIS — R739 Hyperglycemia, unspecified: Secondary | ICD-10-CM

## 2011-07-11 LAB — URINALYSIS
Leukocytes, UA: NEGATIVE
Nitrite: NEGATIVE
Specific Gravity, Urine: <=1.005 (ref 1.000–1.030)
Urobilinogen, UA: 0.2 (ref 0.0–1.0)

## 2011-07-11 LAB — HEPATIC FUNCTION PANEL
Albumin: 4.1 g/dL (ref 3.5–5.2)
Total Bilirubin: 0.8 mg/dL (ref 0.3–1.2)

## 2011-07-11 LAB — BASIC METABOLIC PANEL
CO2: 28 mEq/L (ref 19–32)
Calcium: 9.7 mg/dL (ref 8.4–10.5)
GFR: 85.74 mL/min (ref >60.00)
Potassium: 3.7 mEq/L (ref 3.5–5.1)
Sodium: 139 mEq/L (ref 135–145)

## 2011-07-11 LAB — HEMOGLOBIN A1C: Hgb A1c MFr Bld: 6.6 % — ABNORMAL HIGH (ref 4.6–6.5)

## 2011-07-11 MED ORDER — CARVEDILOL 12.5 MG PO TABS: 12.5000 mg | ORAL_TABLET | Freq: Two times a day (BID) | ORAL | Status: DC

## 2011-07-11 MED ORDER — LEVOTHYROXINE SODIUM 50 MCG PO TABS: 50.0000 ug | ORAL_TABLET | Freq: Every day | ORAL | Status: DC

## 2011-07-11 MED ORDER — OMEPRAZOLE 40 MG PO CPDR: 40.0000 mg | DELAYED_RELEASE_CAPSULE | Freq: Two times a day (BID) | ORAL | Status: DC

## 2011-07-11 MED ORDER — LOSARTAN POTASSIUM 100 MG PO TABS: 100.0000 mg | ORAL_TABLET | Freq: Every day | ORAL | Status: DC

## 2011-07-11 MED ORDER — POTASSIUM CHLORIDE ER 10 MEQ PO TBCR
10.0000 meq | EXTENDED_RELEASE_TABLET | Freq: Every day | ORAL | Status: DC
Start: 2011-07-11 — End: 2012-06-23

## 2011-07-11 NOTE — Patient Instructions (Signed)
Wt Readings from Last 3 Encounters:  07/11/11 208 lb (94.348 kg)  05/23/11 215 lb 8 oz (97.75 kg)  05/18/11 213 lb 12.8 oz (96.979 kg)   BP Readings from Last 3 Encounters:  07/11/11 140/90  05/23/11 160/84  05/18/11 148/84

## 2011-07-11 NOTE — Assessment & Plan Note (Signed)
Better  

## 2011-07-11 NOTE — Assessment & Plan Note (Signed)
EKG  Atypical, chronic 2013 Declined stress test

## 2011-07-11 NOTE — Progress Notes (Signed)
Subjective:    Patient ID: Deanna Salazar, female    DOB: 03-18-1940, 71 y.o.   MRN: [id number]  HPI  C/o chest discomfort off and on and breathing tightness at times since long time. C/o L ankle swelling at times. F/u HTN and elev CBG.  Wt Readings from Last 3 Encounters:  07/11/11 208 lb (94.348 kg)  05/23/11 215 lb 8 oz (97.75 kg)  05/18/11 213 lb 12.8 oz (96.979 kg)   BP Readings from Last 3 Encounters:  07/11/11 140/90  05/23/11 160/84  05/18/11 148/84      Review of Systems  Constitutional: Negative.  Negative for fever, chills, diaphoresis, activity change, appetite change, fatigue and unexpected weight change.  HENT: Negative for hearing loss, ear pain, nosebleeds, congestion, sore throat, facial swelling, rhinorrhea, sneezing, mouth sores, trouble swallowing, neck pain, neck stiffness, postnasal drip, sinus pressure and tinnitus.   Eyes: Negative for pain, discharge, redness, itching and visual disturbance.  Respiratory: Negative for cough, chest tightness, shortness of breath, wheezing and stridor.   Cardiovascular: Negative for chest pain, palpitations and leg swelling.  Gastrointestinal: Negative for nausea, diarrhea, constipation, blood in stool, abdominal distention, anal bleeding and rectal pain.  Genitourinary: Negative for dysuria, urgency, frequency, hematuria, flank pain, vaginal bleeding, vaginal discharge, difficulty urinating, genital sores and pelvic pain.  Musculoskeletal: Negative for back pain, joint swelling, arthralgias and gait problem.  Skin: Negative.  Negative for rash.  Neurological: Negative for dizziness, tremors, seizures, syncope, speech difficulty, weakness, numbness and headaches.  Hematological: Negative for adenopathy. Does not bruise/bleed easily.  Psychiatric/Behavioral: Negative for suicidal ideas, behavioral problems, disturbed wake/sleep cycle, dysphoric mood and decreased concentration. The patient is not nervous/anxious.          Objective:   Physical Exam  Constitutional: She appears well-developed. No distress.       obese  HENT:  Head: Normocephalic.  Right Ear: External ear normal.  Left Ear: External ear normal.  Nose: Nose normal.  Mouth/Throat: Oropharynx is clear and moist.  Eyes: Conjunctivae are normal. Pupils are equal, round, and reactive to light. Right eye exhibits no discharge. Left eye exhibits no discharge.  Neck: Normal range of motion. Neck supple. No JVD present. No tracheal deviation present. No thyromegaly present.  Cardiovascular: Normal rate, regular rhythm and normal heart sounds.   Pulmonary/Chest: No stridor. No respiratory distress. She has no wheezes.  Abdominal: Soft. Bowel sounds are normal. She exhibits no distension and no mass. There is no tenderness. There is no rebound and no guarding.  Musculoskeletal: She exhibits no edema and no tenderness.  Lymphadenopathy:    She has no cervical adenopathy.  Neurological: She displays normal reflexes. No cranial nerve deficit. She exhibits normal muscle tone. Coordination normal.  Skin: No rash noted. No erythema.  Psychiatric: She has a normal mood and affect. Her behavior is normal. Judgment and thought content normal.    Lab Results  Component Value Date   WBC 4.9 11/03/2010   HGB 11.6 11/03/2010   HCT 35.2 11/03/2010   PLT 207 11/03/2010   GLUCOSE 144* 11/03/2010   CHOL 177 10/19/2009   TRIG 58.0 10/19/2009   HDL 52.00 10/19/2009   LDLDIRECT 180.9 07/26/2009   LDLCALC 113* 10/19/2009   ALT 24 11/03/2010   AST 36 11/03/2010   NA 142 11/03/2010   K 3.7 11/03/2010   CL 104 11/03/2010   CREATININE 0.89 11/03/2010   BUN 9 11/03/2010   CO2 27 11/03/2010   TSH 0.99 10/30/2010   HGBA1C  6.5 10/30/2010        Assessment & Plan:

## 2011-07-11 NOTE — Assessment & Plan Note (Signed)
Continue with current prescription therapy as reflected on the Med list. Labs  

## 2011-07-11 NOTE — Assessment & Plan Note (Signed)
Continue with current prescription therapy as reflected on the Med list.  

## 2011-07-11 NOTE — Assessment & Plan Note (Signed)
A1c

## 2011-07-16 ENCOUNTER — Other Ambulatory Visit: Payer: Self-pay | Admitting: Oncology

## 2011-07-16 DIAGNOSIS — C50419 Malignant neoplasm of upper-outer quadrant of unspecified female breast: Secondary | ICD-10-CM

## 2011-07-18 ENCOUNTER — Encounter: Payer: Self-pay | Admitting: Internal Medicine

## 2011-08-07 ENCOUNTER — Telehealth: Payer: Self-pay | Admitting: Internal Medicine

## 2011-08-07 MED ORDER — CYCLOBENZAPRINE HCL 5 MG PO TABS: 5.0000 mg | ORAL_TABLET | Freq: Three times a day (TID) | ORAL | Status: DC | PRN

## 2011-08-07 MED ORDER — CYCLOBENZAPRINE HCL 5 MG PO TABS: 5.0000 mg | ORAL_TABLET | Freq: Three times a day (TID) | ORAL | Status: AC | PRN

## 2011-08-07 NOTE — Telephone Encounter (Signed)
Noted. Try Flexeril 5 mg prn - see Rx OV w/any MD if problems Thx

## 2011-08-07 NOTE — Telephone Encounter (Signed)
Pt advised and requests Rx to [facility] [address] Rx resent.

## 2011-08-07 NOTE — Telephone Encounter (Signed)
Caller: Earlene/Patient; PCP: Plotnikov, Alex; ; Call regarding recently seen in the office for Physical. She is having flair up of Lower Back Pain-near Hip. Larey Seat and injured 3 yrs ago. Sx worsened onset 08/06/11- certain activities causing back to hurt (like picking up groceries or grandkids). She is wondering if med can be called in to help. Allergic to Codiene. Cannot take Ibuprofen d/t stomach issues. Tylenol not helping. Triage and Care advice per Back Sx Protocol and appnt advised within 3 days. She is not wanting to come back into office since MD knows about hx back pain.  Uses [facility] on [address]Requesting med be called in. PLEASE F/U WITH PT. CB#: [phone]

## 2011-08-21 ENCOUNTER — Other Ambulatory Visit: Payer: Self-pay | Admitting: Internal Medicine

## 2011-10-02 ENCOUNTER — Other Ambulatory Visit: Payer: Self-pay | Admitting: Internal Medicine

## 2011-10-09 DIAGNOSIS — IMO0002 Reserved for concepts with insufficient information to code with codable children: Secondary | ICD-10-CM

## 2011-10-09 HISTORY — DX: Reserved for concepts with insufficient information to code with codable children: IMO0002

## 2011-11-06 ENCOUNTER — Other Ambulatory Visit (INDEPENDENT_AMBULATORY_CARE_PROVIDER_SITE_OTHER): Payer: Medicare Other

## 2011-11-06 ENCOUNTER — Encounter: Payer: Self-pay | Admitting: Internal Medicine

## 2011-11-06 ENCOUNTER — Telehealth: Payer: Self-pay | Admitting: Internal Medicine

## 2011-11-06 ENCOUNTER — Ambulatory Visit (INDEPENDENT_AMBULATORY_CARE_PROVIDER_SITE_OTHER): Payer: Medicare Other | Admitting: Internal Medicine

## 2011-11-06 VITALS — BP 130/80 | HR 72 | Temp 98.5°F | Resp 16 | Wt 214.0 lb

## 2011-11-06 DIAGNOSIS — R300 Dysuria: Secondary | ICD-10-CM

## 2011-11-06 DIAGNOSIS — R509 Fever, unspecified: Secondary | ICD-10-CM

## 2011-11-06 DIAGNOSIS — I10 Essential (primary) hypertension: Secondary | ICD-10-CM

## 2011-11-06 DIAGNOSIS — C50919 Malignant neoplasm of unspecified site of unspecified female breast: Secondary | ICD-10-CM

## 2011-11-06 DIAGNOSIS — E119 Type 2 diabetes mellitus without complications: Secondary | ICD-10-CM

## 2011-11-06 LAB — BASIC METABOLIC PANEL
BUN: 11 mg/dL (ref 6–23)
Chloride: 103 mEq/L (ref 96–112)
Creatinine, Ser: 0.9 mg/dL (ref 0.4–1.2)
Glucose, Bld: 126 mg/dL — ABNORMAL HIGH (ref 70–99)
Potassium: 3.5 mEq/L (ref 3.5–5.1)

## 2011-11-06 LAB — CBC WITH DIFFERENTIAL/PLATELET
Basophils Absolute: 0.1 10*3/uL (ref 0.0–0.1)
Eosinophils Absolute: 0.2 10*3/uL (ref 0.0–0.7)
HCT: 36.8 % (ref 36.0–46.0)
Lymphs Abs: 1.9 10*3/uL (ref 0.7–4.0)
MCHC: 32 g/dL (ref 30.0–36.0)
MCV: 89.7 fl (ref 78.0–100.0)
Monocytes Absolute: 0.6 10*3/uL (ref 0.1–1.0)
Platelets: 237 10*3/uL (ref 150.0–400.0)
RDW: 14.2 % (ref 11.5–14.6)

## 2011-11-06 LAB — TSH: TSH: 1.07 u[IU]/mL (ref 0.35–5.50)

## 2011-11-06 LAB — URINALYSIS
Bilirubin Urine: NEGATIVE
Nitrite: NEGATIVE
Total Protein, Urine: NEGATIVE
Urobilinogen, UA: 0.2 (ref 0.0–1.0)

## 2011-11-06 LAB — HEMOGLOBIN A1C: Hgb A1c MFr Bld: 6.5 % (ref 4.6–6.5)

## 2011-11-06 NOTE — Assessment & Plan Note (Signed)
UA

## 2011-11-06 NOTE — Telephone Encounter (Signed)
Caller: [name]/Patient; Patient Name: Deanna Salazar; PCP: Plotnikov, Alex (Adults only); Best Callback Phone Number: [phone].  Patient calling about blood pressures.  States taking a third medication currently.  States at times she gets dizzy, and is "always cold."  BP as low as 114/78.  States overall she feels unwell.  Last BP check was in the office 10/16/11.  Per hypertension protocol, emergent symptoms denied; appt scheduled 11/06/11 1115 with Dr. Posey Rea.

## 2011-11-06 NOTE — Assessment & Plan Note (Signed)
On Arimidex 

## 2011-11-06 NOTE — Assessment & Plan Note (Signed)
BP is nl Continue with current prescription therapy as reflected on the Med list.

## 2011-11-06 NOTE — Progress Notes (Signed)
Patient ID: Deanna Salazar, female   DOB: 06-08-1940, 71 y.o.   MRN: [id number]  Subjective:    Patient ID: Deanna Salazar, female    DOB: July 18, 1940, 71 y.o.   MRN: [id number]  Back Pain This is a new problem. The current episode started in the past 7 days. The problem is unchanged. The pain is present in the lumbar spine. The quality of the pain is described as aching. The pain is mild. Pertinent negatives include no abdominal pain, chest pain, dysuria, fever, headaches, numbness, pelvic pain or weakness. Risk factors include history of cancer.    C/o chest discomfort off and on and breathing tightness at times since long time. C/o L ankle swelling at times. F/u HTN and elev CBG.  Wt Readings from Last 3 Encounters:  11/06/11 214 lb (97.07 kg)  07/11/11 208 lb (94.348 kg)  05/23/11 215 lb 8 oz (97.75 kg)   BP Readings from Last 3 Encounters:  11/06/11 130/80  07/11/11 140/90  05/23/11 160/84      Review of Systems  Constitutional: Negative.  Negative for fever, chills, diaphoresis, activity change, appetite change, fatigue and unexpected weight change.  HENT: Negative for hearing loss, ear pain, nosebleeds, congestion, sore throat, facial swelling, rhinorrhea, sneezing, mouth sores, trouble swallowing, neck pain, neck stiffness, postnasal drip, sinus pressure and tinnitus.   Eyes: Negative for pain, discharge, redness, itching and visual disturbance.  Respiratory: Negative for cough, chest tightness, shortness of breath, wheezing and stridor.   Cardiovascular: Negative for chest pain, palpitations and leg swelling.  Gastrointestinal: Negative for nausea, abdominal pain, diarrhea, constipation, blood in stool, abdominal distention, anal bleeding and rectal pain.  Genitourinary: Negative for dysuria, urgency, frequency, hematuria, flank pain, vaginal bleeding, vaginal discharge, difficulty urinating, genital sores and pelvic pain.  Musculoskeletal: Positive for back pain. Negative for  joint swelling, arthralgias and gait problem.  Skin: Negative.  Negative for rash.  Neurological: Negative for dizziness, tremors, seizures, syncope, speech difficulty, weakness, numbness and headaches.  Hematological: Negative for adenopathy. Does not bruise/bleed easily.  Psychiatric/Behavioral: Negative for suicidal ideas, behavioral problems, disturbed wake/sleep cycle, dysphoric mood and decreased concentration. The patient is not nervous/anxious.        Objective:   Physical Exam  Constitutional: She appears well-developed. No distress.       obese  HENT:  Head: Normocephalic.  Right Ear: External ear normal.  Left Ear: External ear normal.  Nose: Nose normal.  Mouth/Throat: Oropharynx is clear and moist.  Eyes: Conjunctivae normal are normal. Pupils are equal, round, and reactive to light. Right eye exhibits no discharge. Left eye exhibits no discharge.  Neck: Normal range of motion. Neck supple. No JVD present. No tracheal deviation present. No thyromegaly present.  Cardiovascular: Normal rate, regular rhythm and normal heart sounds.   Pulmonary/Chest: No stridor. No respiratory distress. She has no wheezes.  Abdominal: Soft. Bowel sounds are normal. She exhibits no distension and no mass. There is no tenderness. There is no rebound and no guarding.  Musculoskeletal: She exhibits no edema and no tenderness.  Lymphadenopathy:    She has no cervical adenopathy.  Neurological: She displays normal reflexes. No cranial nerve deficit. She exhibits normal muscle tone. Coordination normal.  Skin: No rash noted. No erythema.  Psychiatric: She has a normal mood and affect. Her behavior is normal. Judgment and thought content normal.    Lab Results  Component Value Date   WBC 6.1 11/06/2011   HGB 11.8* 11/06/2011   HCT 36.8 11/06/2011  PLT 237.0 11/06/2011   GLUCOSE 126* 11/06/2011   CHOL 177 10/19/2009   TRIG 58.0 10/19/2009   HDL 52.00 10/19/2009   LDLDIRECT 180.9 07/26/2009     LDLCALC 113* 10/19/2009   ALT 21 07/11/2011   AST 29 07/11/2011   NA 140 11/06/2011   K 3.5 11/06/2011   CL 103 11/06/2011   CREATININE 0.9 11/06/2011   BUN 11 11/06/2011   CO2 29 11/06/2011   TSH 1.07 11/06/2011   HGBA1C 6.5 11/06/2011        Assessment & Plan:

## 2011-11-06 NOTE — Telephone Encounter (Signed)
See ov.

## 2011-11-06 NOTE — Assessment & Plan Note (Addendum)
10/13 diet controlled Labs - Diet Wt loss

## 2011-11-06 NOTE — Assessment & Plan Note (Signed)
R/o UTI UA 

## 2011-11-14 ENCOUNTER — Ambulatory Visit: Payer: Medicare Other | Admitting: Internal Medicine

## 2011-11-16 ENCOUNTER — Encounter: Payer: Self-pay | Admitting: Internal Medicine

## 2011-11-16 ENCOUNTER — Telehealth: Payer: Self-pay | Admitting: Internal Medicine

## 2011-11-16 ENCOUNTER — Ambulatory Visit (INDEPENDENT_AMBULATORY_CARE_PROVIDER_SITE_OTHER)
Admission: RE | Admit: 2011-11-16 | Discharge: 2011-11-16 | Disposition: A | Discharge status: Home / Self Care | Payer: Medicare Other | Source: Ambulatory Visit | Attending: Internal Medicine | Admitting: Internal Medicine

## 2011-11-16 ENCOUNTER — Ambulatory Visit (INDEPENDENT_AMBULATORY_CARE_PROVIDER_SITE_OTHER): Payer: Medicare Other | Admitting: Internal Medicine

## 2011-11-16 VITALS — BP 150/90 | HR 72 | Temp 99.3°F | Resp 16 | Wt 210.0 lb

## 2011-11-16 DIAGNOSIS — R509 Fever, unspecified: Secondary | ICD-10-CM

## 2011-11-16 DIAGNOSIS — J029 Acute pharyngitis, unspecified: Secondary | ICD-10-CM

## 2011-11-16 DIAGNOSIS — J209 Acute bronchitis, unspecified: Secondary | ICD-10-CM

## 2011-11-16 DIAGNOSIS — R05 Cough: Secondary | ICD-10-CM

## 2011-11-16 DIAGNOSIS — R070 Pain in throat: Secondary | ICD-10-CM

## 2011-11-16 MED ORDER — AZITHROMYCIN 250 MG PO TABS: ORAL_TABLET | ORAL | Status: DC

## 2011-11-16 MED ORDER — HYDROCODONE-HOMATROPINE 5-1.5 MG/5ML PO SYRP: 5.0000 mL | ORAL_SOLUTION | Freq: Three times a day (TID) | ORAL | Status: DC | PRN

## 2011-11-16 NOTE — Progress Notes (Signed)
HPI  Pt presents to the clinic today with c/o sore throat, cough with green sputum production, chest congestion and fatigue. These symptoms began 4 days ago and are progressively getting worse over the last couple of days. Patient has also been running fevers. She has taken some tylenol that has helped her get to sleep previously, but now, she lays awake coughing all night long.  Review of Systems    Past Medical History  Diagnosis Date  . Abdominal pain, generalized 02/05/2008  . ABDOMINAL PAIN-EPIGASTRIC 03/24/2009  . Acute bronchitis 02/15/2007  . BACK PAIN 07/20/2008  . BREAST CANCER, HX OF 04/16/2007  . CHEST PAIN 02/23/2010  . COLONIC POLYPS, ADENOMATOUS, HX OF 10/01/2007  . CONSTIPATION, CHRONIC 05/10/2007  . DEPRESSION 11/25/2007  . Dysuria 04/06/2008  . FATIGUE 12/26/2006  . FATTY LIVER DISEASE 11/19/2006  . FIBROMYALGIA 11/19/2006  . HEMATOCHEZIA 05/10/2007  . HEMORRHOIDS, INTERNAL 05/10/2007  . HIP PAIN 07/20/2008  . HYPERLIPIDEMIA 07/26/2009  . HYPERTENSION 12/26/2006  . HYPOKALEMIA 11/25/2007  . HYPOTHYROIDISM 12/26/2006  . IBS 11/19/2006  . INSOMNIA, PERSISTENT 12/26/2006  . KELOID 07/25/2007  . MURMUR 07/25/2007  . Nausea alone 03/23/2010  . PARESTHESIA 04/06/2008  . RENAL CYST, LEFT 02/23/2010  . Rheumatoid arthritis 11/19/2006    Dr Mardene Sayer  . RUQ PAIN 11/07/2009  . 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 GERD 11/19/2006    Dr Russella Dar  . Breast cancer     l, hx of XRT Dr Truett Perna  . Adenomatous colon polyp 04/2000    Family History  Problem Relation Age of Onset  . Colon cancer Brother 44  . Cancer Brother     colon, lung  . Colon cancer Brother 54  . Cancer Brother     colon  . Hypertension Other   . Diabetes Father     History   Social History  . Marital Status: Married    Spouse Name: N/A    Number of Children: N/A  . Years of Education: N/A   Occupational History  . retired    Social History Main Topics  . Smoking status: Never  Smoker   . Smokeless tobacco: Not on file  . Alcohol Use: No  . Drug Use: No  . Sexually Active: Not Currently   Other Topics Concern  . Not on file   Social History Narrative   Patient does not get regular exercise    Allergies  Allergen Reactions  . Codeine     REACTION: nausea after a week or so: can take it short term  . Hydrocodone     REACTION: nausea  . Lovastatin     cramps  . Tramadol Hcl     REACTION: itching     Constitutional: Positive fatigue and fever. Denies headache or abrupt weight changes.  HEENT:  Positive sore throat. Denies eye redness,eye pain, ear pain, ringing in the ears, wax buildup, nasal congestion, runny nose or bloody nose. Respiratory: Positive cough and thick green sputum production and some mild shortness of breath. Denies difficulty breathing. Cardiovascular: Denies chest pain, chest tightness, palpitations or swelling in the hands or feet.   No other specific complaints in a complete review of systems (except as listed in HPI above).  Objective:    General: Appears her stated age, well developed, well nourished in NAD. HEENT: Head: normal shape and size; Eyes: sclera white, no icterus, conjunctiva pink, PERRLA and EOMs intact; Ears: Tm's gray and intact, normal light reflex; Nose: mucosa pink  and moist, septum midline; Throat/Mouth: + PND. Teeth present, mucosa erythematous and moist, no exudate noted, no lesions or ulcerations noted.  Neck: Mild cervical lymphadenopathy. Neck supple, trachea midline. No massses, lumps or thyromegaly present.  Cardiovascular: Normal rate and rhythm. S1,S2 noted.  No murmur, rubs or gallops noted. No JVD or BLE edema. No carotid bruits noted. Pulmonary/Chest: Normal effort and fine rhonchi noted in bilateral bases. No respiratory distress. No wheezes or rales noted.      Assessment & Plan:   Acute bronchitis  Will obtain chest xray today to r/o pneumonia Stay hydrated and get plenty of  rest. Azithromax 250 mg x 5 days  RTC as needed or if symptoms persist.

## 2011-11-16 NOTE — Patient Instructions (Addendum)
Acute Bronchitis You have acute bronchitis. This means you have a chest cold. The airways in your lungs are red and sore (inflamed). Acute means it is sudden onset.   CAUSES Bronchitis is most often caused by the same virus that causes a cold. SYMPTOMS    Body aches.   Chest congestion.   Chills.   Cough.   Fever.   Shortness of breath.   Sore throat.  TREATMENT   Acute bronchitis is usually treated with rest, fluids, and medicines for relief of fever or cough. Most symptoms should go away after a few days or a week. Increased fluids may help thin your secretions and will prevent dehydration. Your caregiver may give you an inhaler to improve your symptoms. The inhaler reduces shortness of breath and helps control cough. You can take over-the-counter pain relievers or cough medicine to decrease coughing, pain, or fever. A cool-air vaporizer may help thin bronchial secretions and make it easier to clear your chest. Antibiotics are usually not needed but can be prescribed if you smoke, are seriously ill, have chronic lung problems, are elderly, or you are at higher risk for developing complications. Allergies and asthma can make bronchitis worse. Repeated episodes of bronchitis may cause longstanding lung problems. Avoid smoking and secondhand smoke. Exposure to cigarette smoke or irritating chemicals will make bronchitis worse. If you are a cigarette smoker, consider using nicotine gum or skin patches to help control withdrawal symptoms. Quitting smoking will help your lungs heal faster. Recovery from bronchitis is often slow, but you should start feeling better after 2 to 3 days. Cough from bronchitis frequently lasts for 3 to 4 weeks. To prevent another bout of acute bronchitis:  Quit smoking.   Wash your hands frequently to get rid of viruses or use a hand sanitizer.   Avoid other people with cold or virus symptoms.   Try not to touch your hands to your mouth, nose, or eyes.  SEEK  IMMEDIATE MEDICAL CARE IF:  You develop increased fever, chills, or chest pain.   You have severe shortness of breath or bloody sputum.   You develop dehydration, fainting, repeated vomiting, or a severe headache.   You have no improvement after 1 week of treatment or you get worse.  MAKE SURE YOU:    Understand these instructions.   Will watch your condition.   Will get help right away if you are not doing well or get worse.  Document Released: 02/02/2004 Document Revised: 03/19/2011 Document Reviewed: 04/19/2010 [facility] Patient Information 2013 [place], [place].   Viral and Bacterial Pharyngitis Pharyngitis is soreness (inflammation) or infection of the pharynx. It is also called a sore throat. CAUSES   Most sore throats are caused by viruses and are part of a cold. However, some sore throats are caused by strep and other bacteria. Sore throats can also be caused by post nasal drip from draining sinuses, allergies and sometimes from sleeping with an open mouth. Infectious sore throats can be spread from person to person by coughing, sneezing and sharing cups or eating utensils. TREATMENT   Sore throats that are viral usually last 3-4 days. Viral illness will get better without medications (antibiotics). Strep throat and other bacterial infections will usually begin to get better about 24-48 hours after you begin to take antibiotics. HOME CARE INSTRUCTIONS    If the caregiver feels there is a bacterial infection or if there is a positive strep test, they will prescribe an antibiotic. The full course of antibiotics must  be taken. If the full course of antibiotic is not taken, you or your child may become ill again. If you or your child has strep throat and do not finish all of the medication, serious heart or kidney diseases may develop.   Drink enough water and fluids to keep your urine clear or pale yellow.   Only take over-the-counter or prescription medicines for pain, discomfort or  fever as directed by your caregiver.   Get lots of rest.   Gargle with salt water ( tsp. of salt in a glass of water) as often as every 1-2 hours as you need for comfort.   Hard candies may soothe the throat if individual is not at risk for choking. Throat sprays or lozenges may also be used.  SEEK MEDICAL CARE IF:    Large, tender lumps in the neck develop.   A rash develops.   Green, yellow-brown or bloody sputum is coughed up.   Your baby is older than 3 months with a rectal temperature of 100.5 F (38.1 C) or higher for more than 1 day.  SEEK IMMEDIATE MEDICAL CARE IF:    A stiff neck develops.   You or your child are drooling or unable to swallow liquids.   You or your child are vomiting, unable to keep medications or liquids down.   You or your child has severe pain, unrelieved with recommended medications.   You or your child are having difficulty breathing (not due to stuffy nose).   You or your child are unable to fully open your mouth.   You or your child develop redness, swelling, or severe pain anywhere on the neck.   You have a fever.   Your baby is older than 3 months with a rectal temperature of 102 F (38.9 C) or higher.   Your baby is 20 months old or younger with a rectal temperature of 100.4 F (38 C) or higher.  MAKE SURE YOU:    Understand these instructions.   Will watch your condition.   Will get help right away if you are not doing well or get worse.  Document Released: 12/25/2004 Document Revised: 03/19/2011 Document Reviewed: 03/24/2007 [facility] Patient Information 2013 [place], [place].

## 2011-11-16 NOTE — Telephone Encounter (Signed)
Caller: [name]/Patient; Patient Name: Deanna Salazar; PCP: Plotnikov, Alex (Adults only); Best Callback Phone Number: [phone].  Patient calling about hoarseness, coughing and wheezing.  States she has cough productive of greenish phlegm.  Onset 11/13/11 of sore throat and the rest of the symptoms and has worsened over past 24 hours.  Temp/tactile.  Per sore throat protocol, emergent symptoms denied; advised appt within 24 hours.  Appt scheduled 11/16/11 1345 with Ms. Sampson Si.

## 2011-11-19 ENCOUNTER — Encounter: Payer: Self-pay | Admitting: Internal Medicine

## 2011-11-21 ENCOUNTER — Telehealth: Payer: Self-pay

## 2011-11-21 NOTE — Telephone Encounter (Signed)
Pt advised.

## 2011-11-21 NOTE — Telephone Encounter (Signed)
Pt called requesting results of recent labs, please advise.  

## 2011-11-21 NOTE — Telephone Encounter (Signed)
Deanna Salazar, please, inform patient that all labs are normal except for elev glu Thx

## 2011-12-25 ENCOUNTER — Telehealth: Payer: Self-pay | Admitting: Internal Medicine

## 2011-12-25 NOTE — Telephone Encounter (Signed)
Patient Information:  Caller Name: Tyrell Antonio  Phone: [phone]  Patient: Deanna Salazar, Deanna Salazar  Gender: Female  DOB: [birth year]  Age: 71 Years  PCP: Plotnikov, Alex (Adults only)  Office Follow Up:  Does the office need to follow up with this patient?: Yes  Instructions For The Office: Patient is wanting script for "fluid pill". Advised needs appointment due to edema but states does not have money for co pay. Wants MD to write script for "fluid pill".   Symptoms  Reason For Call & Symptoms: Has noticed increased swelling in legs for past 2-3 weeks. When removes socks, has marks left on calf. No shortness of breath. Is bilateral swelling.  Reviewed Health History In EMR: Yes  Reviewed Medications In EMR: Yes  Reviewed Allergies In EMR: Yes  Reviewed Surgeries / Procedures: Yes  Date of Onset of Symptoms: 12/04/2011  Treatments Tried: Elevation of legs at night  Treatments Tried Worked: No  Guideline(s) Used:  Leg Swelling and Edema  Disposition Per Guideline:   See Today in Office  Reason For Disposition Reached:   Moderate swelling of both ankles (e.g., swelling extends up to the knees) AND new onset or worsening  Advice Given:  N/A  Patient Refused Recommendation:  Patient Requests Prescription  Doesnot have money for co pay.

## 2011-12-26 MED ORDER — FUROSEMIDE 20 MG PO TABS: 20.0000 mg | ORAL_TABLET | Freq: Every day | ORAL | Status: DC | PRN

## 2011-12-26 NOTE — Telephone Encounter (Signed)
Pt informed of rx via VM and to callback office with any questions/concerns.

## 2011-12-26 NOTE — Telephone Encounter (Signed)
Done. Thx.

## 2012-01-07 ENCOUNTER — Ambulatory Visit
Admission: RE | Admit: 2012-01-07 | Discharge: 2012-01-07 | Disposition: A | Discharge status: Home / Self Care | Payer: Medicare Other | Source: Ambulatory Visit | Attending: Oncology | Admitting: Oncology

## 2012-01-07 DIAGNOSIS — C50919 Malignant neoplasm of unspecified site of unspecified female breast: Secondary | ICD-10-CM

## 2012-01-11 ENCOUNTER — Telehealth: Payer: Self-pay | Admitting: Oncology

## 2012-01-11 NOTE — Telephone Encounter (Signed)
Pt called and gave her appt for January 2014 ML only

## 2012-01-16 ENCOUNTER — Ambulatory Visit (HOSPITAL_BASED_OUTPATIENT_CLINIC_OR_DEPARTMENT_OTHER): Payer: Medicare Other | Admitting: Nurse Practitioner

## 2012-01-16 ENCOUNTER — Telehealth: Payer: Self-pay | Admitting: Oncology

## 2012-01-16 VITALS — BP 160/78 | HR 73 | Temp 98.7°F | Resp 20 | Ht 63.0 in | Wt 204.3 lb

## 2012-01-16 DIAGNOSIS — M129 Arthropathy, unspecified: Secondary | ICD-10-CM

## 2012-01-16 DIAGNOSIS — C50419 Malignant neoplasm of upper-outer quadrant of unspecified female breast: Secondary | ICD-10-CM

## 2012-01-16 DIAGNOSIS — Z23 Encounter for immunization: Secondary | ICD-10-CM

## 2012-01-16 DIAGNOSIS — C50919 Malignant neoplasm of unspecified site of unspecified female breast: Secondary | ICD-10-CM

## 2012-01-16 DIAGNOSIS — I10 Essential (primary) hypertension: Secondary | ICD-10-CM

## 2012-01-16 MED ORDER — INFLUENZA VIRUS VACC SPLIT PF IM SUSP
0.5000 mL | Freq: Once | INTRAMUSCULAR | Status: AC
  Administered 2012-01-16: 0.5 mL via INTRAMUSCULAR
  Filled 2012-01-16: qty 0.5

## 2012-01-16 NOTE — Telephone Encounter (Signed)
gv and printed appt schedule for July ..the patient aware

## 2012-01-16 NOTE — Progress Notes (Signed)
OFFICE PROGRESS NOTE  Interval history:  [name] returns as scheduled. She continues Arimidex. She recently discontinued prednisone due to concerns regarding long-term side effects. She has noted a significant increase in arthritis symptoms since stopping the prednisone. She has a good appetite. No shortness of breath. 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  Mammogram 01/07/2012 with no evidence of breast malignancy.   Objective: Blood pressure 160/78, pulse 73, temperature 98.7 F (37.1 C), temperature source Oral, resp. rate 20, height 5\' 3"  (1.6 m), weight 204 lb 4.8 oz (92.67 kg).  Oropharynx is without thrush or ulceration. No palpable cervical, supraclavicular or axillary lymph nodes. Status post left lumpectomy. No mass palpated in either breast. Lungs are clear. Regular cardiac rhythm. Abdomen soft and nontender. No hepatomegaly. Extremities are without edema.  Lab Results: Lab Results  Component Value Date   WBC 6.1 11/06/2011   HGB 11.8* 11/06/2011   HCT 36.8 11/06/2011   MCV 89.7 11/06/2011   PLT 237.0 11/06/2011    Chemistry:    Chemistry      Component Value Date/Time   NA 140 11/06/2011 1141   K 3.5 11/06/2011 1141   CL 103 11/06/2011 1141   CO2 29 11/06/2011 1141   BUN 11 11/06/2011 1141   CREATININE 0.9 11/06/2011 1141      Component Value Date/Time   CALCIUM 9.6 11/06/2011 1141   ALKPHOS 102 07/11/2011 1357   AST 29 07/11/2011 1357   ALT 21 07/11/2011 1357   BILITOT 0.8 07/11/2011 1357       Studies/Results: Mm Digital Diagnostic Bilat  01/07/2012  *RADIOLOGY REPORT*  Clinical Data:  72 year old female for annual bilateral mammograms - history of left breast cancer and lumpectomy in 2009.  DIGITAL DIAGNOSTIC BILATERAL MAMMOGRAM WITH CAD  Comparison: 01/04/2011 and prior mammograms dating back to 12/17/2007.  Findings:  ACR Breast Density Category 2: There is a scattered fibroglandular pattern.  Magnification views of the  lumpectomy site and routine views of both breasts again demonstrate scarring within the upper outer left breast. There is no evidence of suspicious mass, nonsurgical distortion or worrisome calcifications bilaterally.  Mammographic images were processed with CAD.  IMPRESSION: No mammographic evidence of breast malignancy.  Left breast scarring.  BI-RADS CATEGORY 2:  Benign finding(s).  RECOMMENDATION: Bilateral diagnostic mammograms in 1 year  I discussed the findings and recommendations with the patient and her questions answered.  She was encouraged to begin/continue monthly self exams and to contact her primary physician if any changes noted. A written report was given to the patient.   Original Report Authenticated By: Harmon Pier, M.D.     Medications: I have reviewed the patient's current medications.  Assessment/Plan:  1. Stage I left-sided breast cancer, status post left lumpectomy and sentinel lymph node biopsy followed by left breast radiation. She began Arimidex in early July 2009. 2. History of hypertension. 3. Arthritis. Chronic. She recently discontinued prednisone and has since noted increased arthritis pain. She will contact her rheumatologist. 4. Status post partial thyroidectomy.  Disposition-Deanna Salazar remains in clinical remission from breast cancer. She will continue Arimidex. She will return for a followup visit in 6 months which will coincide with completion of 5 years of Arimidex therapy. She will contact the office in the interim with any problems. We administered the annual influenza vaccine today.    Lonna Cobb ANP/GNP-BC

## 2012-04-01 ENCOUNTER — Other Ambulatory Visit: Payer: Self-pay | Admitting: Internal Medicine

## 2012-05-15 ENCOUNTER — Telehealth: Payer: Self-pay | Admitting: Gastroenterology

## 2012-05-15 NOTE — Telephone Encounter (Signed)
Left message for pt to call back  °

## 2012-05-16 NOTE — Telephone Encounter (Signed)
Patient reports change in bowel habits for the last month, worsening constipation.  She is taking Miralax 2-3 times a day and on a high fiber diet with plenty of oral fluids.  She is asked to add PRN MOM and is scheduled for follow up with Dr. Russella Dar 05/28/12

## 2012-05-28 ENCOUNTER — Encounter: Payer: Self-pay | Admitting: Gastroenterology

## 2012-05-28 ENCOUNTER — Ambulatory Visit (INDEPENDENT_AMBULATORY_CARE_PROVIDER_SITE_OTHER): Payer: Medicare Other | Admitting: Gastroenterology

## 2012-05-28 VITALS — BP 110/78 | HR 68 | Ht 63.0 in | Wt 194.6 lb

## 2012-05-28 DIAGNOSIS — K5900 Constipation, unspecified: Secondary | ICD-10-CM

## 2012-05-28 DIAGNOSIS — K219 Gastro-esophageal reflux disease without esophagitis: Secondary | ICD-10-CM

## 2012-05-28 DIAGNOSIS — Z86010 Personal history of colonic polyps: Secondary | ICD-10-CM

## 2012-05-28 MED ORDER — PEG-KCL-NACL-NASULF-NA ASC-C 100 G PO SOLR: 1.0000 | Freq: Once | ORAL | Status: DC

## 2012-05-28 NOTE — Progress Notes (Signed)
History of Present Illness: This is a 72 year old female with chronic constipation and her constipation has worsened over the past 3 weeks with a very little stool output. She relates that her abdomen is not bloated and uncomfortable and she has lower back pain. She has not been compliant with the regular use of MiraLax but states she has been using it since she became more constipated with no effect. Her reflux symptoms are under very good control right now and she asks about discontinuing omeprazole.  Current Medications, Allergies, Past Medical History, Past Surgical History, Family History and Social History were reviewed in [employer] record.  Physical Exam: General: Well developed , well nourished, no acute distress Head: Normocephalic and atraumatic Eyes:  sclerae anicteric, EOMI Ears: Normal auditory acuity Mouth: No deformity or lesions Lungs: Clear throughout to auscultation Heart: Regular rate and rhythm; no murmurs, rubs or bruits Abdomen: Soft, non tender and non distended. No masses, hepatosplenomegaly or hernias noted. Normal Bowel sounds Rectal: deferred to colonoscopy Musculoskeletal: Symmetrical with no gross deformities  Pulses:  Normal pulses noted Extremities: No clubbing, cyanosis, edema or deformities noted Neurological: Alert oriented x 4, grossly nonfocal Psychological:  Alert and cooperative. Depressed affect  Assessment and Recommendations:  1. Constipation/obstipation. SuPrep for bowel purge. Change to MiraLax from needed to a regular schedule 2 times every day after SuPrep purge. She relates that probiotics were also helpful. Resume Align daily. Consider Amitiza or Linzess if the regular use of MiraLax is not adequate.  2. GERD. Continue Prilosec to 40 mg twice daily. She states she would like to temporarily discontinue this medication I reminded her that when she has been on  Prilosec qd or off Prilosec her reflux symptoms typically  flare.  3. History of adenomatous colon polyps. Surveillance colonoscopy due July 2014. Schedule colonoscopy with an extended bowel prep .The risks, benefits, and alternatives to colonoscopy with possible biopsy and possible polypectomy were discussed with the patient and they consent to proceed.

## 2012-05-28 NOTE — Patient Instructions (Addendum)
Starting tomorrow be on clear liquids all day and follow instructions on Suprep box. After you have finished with bowel purge then start back on Miralax twice daily every day to prevent constipation.   You have been scheduled for a colonoscopy with propofol. Please follow written instructions given to you at your visit today.  Please pick up your prep kit at the pharmacy within the next 1-3 days. If you use inhalers (even only as needed), please bring them with you on the day of your procedure. Your physician has requested that you go to www.startemmi.com and enter the access code given to you at your visit today. This web site gives a general overview about your procedure. However, you should still follow specific instructions given to you by our office regarding your preparation for the procedure.  Thank you for choosing me and [facility].  Venita Lick. Pleas Koch., MD., Clementeen Graham

## 2012-05-29 ENCOUNTER — Encounter: Payer: Self-pay | Admitting: Gastroenterology

## 2012-06-04 ENCOUNTER — Telehealth: Payer: Self-pay | Admitting: Gastroenterology

## 2012-06-04 NOTE — Telephone Encounter (Signed)
Patient called to make sure she new when to start the 2 day prep. Went over the instructions over the phone with patient and told her to call back with any other questions.

## 2012-06-12 ENCOUNTER — Encounter: Payer: Self-pay | Admitting: Gastroenterology

## 2012-06-12 ENCOUNTER — Ambulatory Visit (AMBULATORY_SURGERY_CENTER): Payer: Medicare Other | Admitting: Gastroenterology

## 2012-06-12 VITALS — BP 120/72 | HR 57 | Temp 98.8°F | Resp 17 | Ht 63.0 in | Wt 194.0 lb

## 2012-06-12 DIAGNOSIS — Z800 Family history of malignant neoplasm of digestive organs: Secondary | ICD-10-CM

## 2012-06-12 DIAGNOSIS — D126 Benign neoplasm of colon, unspecified: Secondary | ICD-10-CM

## 2012-06-12 DIAGNOSIS — Z1211 Encounter for screening for malignant neoplasm of colon: Secondary | ICD-10-CM

## 2012-06-12 DIAGNOSIS — Z86010 Personal history of colonic polyps: Secondary | ICD-10-CM

## 2012-06-12 HISTORY — PX: COLONOSCOPY: SHX174

## 2012-06-12 MED ORDER — SODIUM CHLORIDE 0.9 % IV SOLN: 500.0000 mL | INTRAVENOUS | Status: DC

## 2012-06-12 NOTE — Progress Notes (Signed)
Report to pacu rn, vss, bbs=clear 

## 2012-06-12 NOTE — Op Note (Signed)
[facility] [address] [place], [address]   COLONOSCOPY PROCEDURE REPORT  PATIENT: Deanna Salazar, Deanna Salazar  MR#: [id number] BIRTHDATE: 09-03-1940 , 71  yrs. old GENDER: Female ENDOSCOPIST: Meryl Dare, MD, [facility] PROCEDURE DATE:  06/12/2012 PROCEDURE:   Colonoscopy with biopsy and snare polypectomy ASA CLASS:   Class II INDICATIONS:Patient's personal history of adenomatous colon polyps and Patient's immediate family history of colon cancer. MEDICATIONS: MAC sedation, administered by CRNA and propofol (Diprivan) 200mg  IV DESCRIPTION OF PROCEDURE:   After the risks benefits and alternatives of the procedure were thoroughly explained, informed consent was obtained.  A digital rectal exam revealed no abnormalities of the rectum.   The LB [id number]  endoscope was introduced through the anus and advanced to the cecum, which was identified by both the appendix and ileocecal valve. No adverse events experienced.   The quality of the prep was good, using MoviPrep  The instrument was then slowly withdrawn as the colon was fully examined.  COLON FINDINGS: Two sessile polyps were found in the transverse colon.  A polypectomy was performed with a cold snare.  The resection was complete and the polyp tissue was completely retrieved.  A polypectomy was performed with cold forceps.   Two sessile polyps were found in the sigmoid colon.  A polypectomy was performed with cold forceps.  The resection was complete and the polyp tissue was completely retrieved.   The colon was otherwise normal.  There was no diverticulosis, inflammation, polyps or cancers unless previously stated.  Retroflexed views revealed no abnormalities. The time to cecum=2 minutes 07 seconds.  Withdrawal time=8 minutes 39 seconds.  The scope was withdrawn and the procedure completed.  COMPLICATIONS: There were no complications.  ENDOSCOPIC IMPRESSION: 1.   Two sessile polyps in the transverse colon;  polypectomy performed with a cold snare and with cold forceps 2.   Two sessile polyps in the sigmoid colon; polypectomy performed with cold forceps  RECOMMENDATIONS: 1.  Await pathology results 2.  Repeat Colonoscopy in 5 years.  eSigned:  Meryl Dare, MD, [facility] 06/12/2012 1:31 PM

## 2012-06-12 NOTE — Progress Notes (Signed)
Called to room to assist during endoscopic procedure.  Patient ID and intended procedure confirmed with present staff. Received instructions for my participation in the procedure from the performing physician.  

## 2012-06-12 NOTE — Patient Instructions (Addendum)
YOU HAD AN ENDOSCOPIC PROCEDURE TODAY AT THE [facility]: Refer to the procedure report that was given to you for any specific questions about what was found during the examination.  If the procedure report does not answer your questions, please call your gastroenterologist to clarify.  If you requested that your care partner not be given the details of your procedure findings, then the procedure report has been included in a sealed envelope for you to review at your convenience later.  YOU SHOULD EXPECT: Some feelings of bloating in the abdomen. Passage of more gas than usual.  Walking can help get rid of the air that was put into your GI tract during the procedure and reduce the bloating. If you had a lower endoscopy (such as a colonoscopy or flexible sigmoidoscopy) you may notice spotting of blood in your stool or on the toilet paper. If you underwent a bowel prep for your procedure, then you may not have a normal bowel movement for a few days.  DIET: Your first meal following the procedure should be a light meal and then it is ok to progress to your normal diet.  A half-sandwich or bowl of soup is an example of a good first meal.  Heavy or fried foods are harder to digest and may make you feel nauseous or bloated.  Likewise meals heavy in dairy and vegetables can cause extra gas to form and this can also increase the bloating.  Drink plenty of fluids but you should avoid alcoholic beverages for 24 hours.  ACTIVITY: Your care partner should take you home directly after the procedure.  You should plan to take it easy, moving slowly for the rest of the day.  You can resume normal activity the day after the procedure however you should NOT DRIVE or use heavy machinery for 24 hours (because of the sedation medicines used during the test).    SYMPTOMS TO REPORT IMMEDIATELY: A gastroenterologist can be reached at any hour.  During normal business hours, 8:30 AM to 5:00 PM Monday through Friday,  call [phone].  After hours and on weekends, please call the GI answering service at [phone] who will take a message and have the physician on call contact you.   Following lower endoscopy (colonoscopy or flexible sigmoidoscopy):  Excessive amounts of blood in the stool  Significant tenderness or worsening of abdominal pains  Swelling of the abdomen that is new, acute  Fever of 100F or higher    FOLLOW UP: If any biopsies were taken you will be contacted by phone or by letter within the next 1-3 weeks.  Call your gastroenterologist if you have not heard about the biopsies in 3 weeks.  Our staff will call the home number listed on your records the next business day following your procedure to check on you and address any questions or concerns that you may have at that time regarding the information given to you following your procedure. This is a courtesy call and so if there is no answer at the home number and we have not heard from you through the emergency physician on call, we will assume that you have returned to your regular daily activities without incident.  SIGNATURES/CONFIDENTIALITY: You and/or your care partner have signed paperwork which will be entered into your electronic medical record.  These signatures attest to the fact that that the information above on your After Visit Summary has been reviewed and is understood.  Full responsibility of the confidentiality   of this discharge information lies with you and/or your care-partner.  Polyp information given.  Next colonoscopy 5 years. 

## 2012-06-12 NOTE — Progress Notes (Signed)
Patient did not experience any of the following events: a burn prior to discharge; a fall within the facility; wrong site/side/patient/procedure/implant event; or a hospital transfer or hospital admission upon discharge from the facility. (G8907) Patient did not have preoperative order for IV antibiotic SSI prophylaxis. (G8918)  

## 2012-06-13 ENCOUNTER — Telehealth: Payer: Self-pay | Admitting: *Deleted

## 2012-06-13 NOTE — Telephone Encounter (Signed)
  Follow up Call-  Call back number 06/12/2012  Post procedure Call Back phone  # [phone]  Permission to leave phone message Yes     Patient questions:  Do you have a fever, pain , or abdominal swelling? no Pain Score  0 *  Have you tolerated food without any problems? yes  Have you been able to return to your normal activities? yes  Do you have any questions about your discharge instructions: Diet   no Medications  no Follow up visit  no  Do you have questions or concerns about your Care? no  Actions: * If pain score is 4 or above: No action needed, pain <4.

## 2012-06-22 ENCOUNTER — Encounter: Payer: Self-pay | Admitting: Gastroenterology

## 2012-06-23 ENCOUNTER — Encounter: Payer: Self-pay | Admitting: Internal Medicine

## 2012-06-23 ENCOUNTER — Ambulatory Visit (INDEPENDENT_AMBULATORY_CARE_PROVIDER_SITE_OTHER): Payer: Medicare Other | Admitting: Internal Medicine

## 2012-06-23 VITALS — BP 120/80 | HR 72 | Temp 98.7°F | Resp 16 | Wt 195.0 lb

## 2012-06-23 DIAGNOSIS — E119 Type 2 diabetes mellitus without complications: Secondary | ICD-10-CM

## 2012-06-23 DIAGNOSIS — E785 Hyperlipidemia, unspecified: Secondary | ICD-10-CM

## 2012-06-23 DIAGNOSIS — R209 Unspecified disturbances of skin sensation: Secondary | ICD-10-CM

## 2012-06-23 DIAGNOSIS — I10 Essential (primary) hypertension: Secondary | ICD-10-CM

## 2012-06-23 DIAGNOSIS — E559 Vitamin D deficiency, unspecified: Secondary | ICD-10-CM

## 2012-06-23 DIAGNOSIS — C50912 Malignant neoplasm of unspecified site of left female breast: Secondary | ICD-10-CM

## 2012-06-23 DIAGNOSIS — C50919 Malignant neoplasm of unspecified site of unspecified female breast: Secondary | ICD-10-CM

## 2012-06-23 DIAGNOSIS — R202 Paresthesia of skin: Secondary | ICD-10-CM

## 2012-06-23 DIAGNOSIS — E039 Hypothyroidism, unspecified: Secondary | ICD-10-CM

## 2012-06-23 DIAGNOSIS — M069 Rheumatoid arthritis, unspecified: Secondary | ICD-10-CM

## 2012-06-23 DIAGNOSIS — K7689 Other specified diseases of liver: Secondary | ICD-10-CM

## 2012-06-23 DIAGNOSIS — M549 Dorsalgia, unspecified: Secondary | ICD-10-CM

## 2012-06-23 MED ORDER — POTASSIUM CHLORIDE ER 10 MEQ PO TBCR: 10.0000 meq | EXTENDED_RELEASE_TABLET | Freq: Every day | ORAL | Status: DC

## 2012-06-23 MED ORDER — AMLODIPINE BESYLATE 5 MG PO TABS: 5.0000 mg | ORAL_TABLET | Freq: Every day | ORAL | Status: DC

## 2012-06-23 NOTE — Assessment & Plan Note (Signed)
Loose wt Labs  Wt Readings from Last 3 Encounters:  06/23/12 195 lb (88.451 kg)  06/12/12 194 lb (87.998 kg)  05/28/12 194 lb 9.6 oz (88.27 kg)

## 2012-06-23 NOTE — Assessment & Plan Note (Signed)
Continue with current prescription therapy as reflected on the Med list.  

## 2012-06-23 NOTE — Assessment & Plan Note (Signed)
  On diet  

## 2012-06-23 NOTE — Assessment & Plan Note (Signed)
Continue with current prn prescription therapy as reflected on the Med list. On RA Rx

## 2012-06-23 NOTE — Progress Notes (Signed)
Subjective:    Back Pain This is a chronic problem. The current episode started more than 1 year ago. The problem is unchanged. The pain is present in the lumbar spine. The quality of the pain is described as aching. The pain is mild. Pertinent negatives include no abdominal pain, chest pain, dysuria, fever, headaches, numbness, pelvic pain or weakness. Risk factors include history of cancer.     C/o L ankle swelling at times. F/u HTN and elev CBG. C/o fatigue  Wt Readings from Last 3 Encounters:  06/23/12 195 lb (88.451 kg)  06/12/12 194 lb (87.998 kg)  05/28/12 194 lb 9.6 oz (88.27 kg)   BP Readings from Last 3 Encounters:  06/23/12 120/80  06/12/12 120/72  05/28/12 110/78      Review of Systems  Constitutional: Negative.  Negative for fever, chills, diaphoresis, activity change, appetite change, fatigue and unexpected weight change.  HENT: Negative for hearing loss, ear pain, nosebleeds, congestion, sore throat, facial swelling, rhinorrhea, sneezing, mouth sores, trouble swallowing, neck pain, neck stiffness, postnasal drip, sinus pressure and tinnitus.   Eyes: Negative for pain, discharge, redness, itching and visual disturbance.  Respiratory: Negative for cough, chest tightness, shortness of breath, wheezing and stridor.   Cardiovascular: Negative for chest pain, palpitations and leg swelling.  Gastrointestinal: Negative for nausea, abdominal pain, diarrhea, constipation, blood in stool, abdominal distention, anal bleeding and rectal pain.  Genitourinary: Negative for dysuria, urgency, frequency, hematuria, flank pain, vaginal bleeding, vaginal discharge, difficulty urinating, genital sores and pelvic pain.  Musculoskeletal: Positive for back pain. Negative for joint swelling, arthralgias and gait problem.  Skin: Negative.  Negative for rash.  Neurological: Negative for dizziness, tremors, seizures, syncope, speech difficulty, weakness, numbness and headaches.   Hematological: Negative for adenopathy. Does not bruise/bleed easily.  Psychiatric/Behavioral: Negative for suicidal ideas, behavioral problems, sleep disturbance, dysphoric mood and decreased concentration. The patient is not nervous/anxious.        Objective:   Physical Exam  Constitutional: She appears well-developed. No distress.  obese  HENT:  Head: Normocephalic.  Right Ear: External ear normal.  Left Ear: External ear normal.  Nose: Nose normal.  Mouth/Throat: Oropharynx is clear and moist.  Eyes: Conjunctivae are normal. Pupils are equal, round, and reactive to light. Right eye exhibits no discharge. Left eye exhibits no discharge.  Neck: Normal range of motion. Neck supple. No JVD present. No tracheal deviation present. No thyromegaly present.  Cardiovascular: Normal rate, regular rhythm and normal heart sounds.   Pulmonary/Chest: No stridor. No respiratory distress. She has no wheezes.  Abdominal: Soft. Bowel sounds are normal. She exhibits no distension and no mass. There is no tenderness. There is no rebound and no guarding.  Musculoskeletal: She exhibits no edema and no tenderness.  Lymphadenopathy:    She has no cervical adenopathy.  Neurological: She displays normal reflexes. No cranial nerve deficit. She exhibits normal muscle tone. Coordination normal.  Skin: No rash noted. No erythema.  Psychiatric: She has a normal mood and affect. Her behavior is normal. Judgment and thought content normal.    Lab Results  Component Value Date   WBC 6.1 11/06/2011   HGB 11.8* 11/06/2011   HCT 36.8 11/06/2011   PLT 237.0 11/06/2011   GLUCOSE 126* 11/06/2011   CHOL 177 10/19/2009   TRIG 58.0 10/19/2009   HDL 52.00 10/19/2009   LDLDIRECT 180.9 07/26/2009   LDLCALC 113* 10/19/2009   ALT 21 07/11/2011   AST 29 07/11/2011   NA 140 11/06/2011  K 3.5 11/06/2011   CL 103 11/06/2011   CREATININE 0.9 11/06/2011   BUN 11 11/06/2011   CO2 29 11/06/2011   TSH 1.07 11/06/2011    HGBA1C 6.5 11/06/2011        Assessment & Plan:

## 2012-06-23 NOTE — Assessment & Plan Note (Signed)
Labs

## 2012-06-24 ENCOUNTER — Other Ambulatory Visit (INDEPENDENT_AMBULATORY_CARE_PROVIDER_SITE_OTHER): Payer: Medicare Other

## 2012-06-24 DIAGNOSIS — K7689 Other specified diseases of liver: Secondary | ICD-10-CM

## 2012-06-24 DIAGNOSIS — E119 Type 2 diabetes mellitus without complications: Secondary | ICD-10-CM

## 2012-06-24 DIAGNOSIS — R209 Unspecified disturbances of skin sensation: Secondary | ICD-10-CM

## 2012-06-24 DIAGNOSIS — C50919 Malignant neoplasm of unspecified site of unspecified female breast: Secondary | ICD-10-CM

## 2012-06-24 DIAGNOSIS — R202 Paresthesia of skin: Secondary | ICD-10-CM

## 2012-06-24 DIAGNOSIS — E785 Hyperlipidemia, unspecified: Secondary | ICD-10-CM

## 2012-06-24 DIAGNOSIS — C50912 Malignant neoplasm of unspecified site of left female breast: Secondary | ICD-10-CM

## 2012-06-24 DIAGNOSIS — E559 Vitamin D deficiency, unspecified: Secondary | ICD-10-CM

## 2012-06-24 DIAGNOSIS — M069 Rheumatoid arthritis, unspecified: Secondary | ICD-10-CM

## 2012-06-24 DIAGNOSIS — I10 Essential (primary) hypertension: Secondary | ICD-10-CM

## 2012-06-24 DIAGNOSIS — M549 Dorsalgia, unspecified: Secondary | ICD-10-CM

## 2012-06-24 DIAGNOSIS — E039 Hypothyroidism, unspecified: Secondary | ICD-10-CM

## 2012-06-24 LAB — LIPID PANEL
HDL: 44 mg/dL (ref >39.00)
Total CHOL/HDL Ratio: 4
Triglycerides: 91 mg/dL (ref 0.0–149.0)

## 2012-06-24 LAB — HEPATIC FUNCTION PANEL
Bilirubin, Direct: 0 mg/dL (ref 0.0–0.3)
Total Bilirubin: 0.3 mg/dL (ref 0.3–1.2)
Total Protein: 6.9 g/dL (ref 6.0–8.3)

## 2012-06-24 LAB — BASIC METABOLIC PANEL
CO2: 29 mEq/L (ref 19–32)
Calcium: 9.6 mg/dL (ref 8.4–10.5)
Creatinine, Ser: 1 mg/dL (ref 0.4–1.2)

## 2012-06-25 LAB — VITAMIN D 25 HYDROXY (VIT D DEFICIENCY, FRACTURES): Vit D, 25-Hydroxy: 52 ng/mL (ref 30–89)

## 2012-07-15 ENCOUNTER — Ambulatory Visit (HOSPITAL_BASED_OUTPATIENT_CLINIC_OR_DEPARTMENT_OTHER): Payer: Medicare Other | Admitting: Oncology

## 2012-07-15 ENCOUNTER — Telehealth: Payer: Self-pay | Admitting: Oncology

## 2012-07-15 VITALS — BP 146/85 | HR 70 | Temp 98.9°F | Resp 18 | Ht 63.0 in | Wt 194.8 lb

## 2012-07-15 DIAGNOSIS — C50919 Malignant neoplasm of unspecified site of unspecified female breast: Secondary | ICD-10-CM

## 2012-07-15 DIAGNOSIS — C50912 Malignant neoplasm of unspecified site of left female breast: Secondary | ICD-10-CM

## 2012-07-15 NOTE — Progress Notes (Signed)
   [facility]    OFFICE PROGRESS NOTE   INTERVAL HISTORY:   She returns as scheduled. She continues Arimidex. No hot flashes. Arthralgias in multiple sites. She is taking methotrexate for arthritis. Good appetite. No change over either breast. A bilateral mammogram on 01/07/2012 was negative.  Objective:  Vital signs in last 24 hours:  Blood pressure 146/85, pulse 70, temperature 98.9 F (37.2 C), temperature source Oral, resp. rate 18, height 5\' 3"  (1.6 m), weight 194 lb 12.8 oz (88.361 kg).    HEENT: Neck without mass Lymphatics: No cervical, supraclavicular, or left axillary nodes approximate 1 cm soft oval area in the high right axilla-likely a prominent fat pad Resp: Lungs clear bilaterally Cardio: Regular rate and rhythm GI: No hepatomegaly Vascular: No leg edema Breasts: Status post left lumpectomy. No evidence for local tumor recurrence. No mass in either breast.     Medications: I have reviewed the patient's current medications.  Assessment/Plan: 1. Stage I left-sided breast cancer (synchronous T1 primary tumors in the left breast: ER positive, PR positive, HER-2 negative), status post left lumpectomy and sentinel lymph node biopsy followed by left breast radiation. She began Arimidex in early July 2009. She has completed 5 years of Arimidex 2. History of hypertension. 3. Arthritis. Chronic. She is now taking methotrexate  4. Status post partial thyroidectomy.  Disposition:  Deanna Salazar remains in clinical remission from breast cancer. She will discontinue Arimidex. She will schedule a bilateral mammogram for late December of 2014 or early January of 2015. Deanna Salazar would like to continue followup at the cancer Center. She will return for an office visit in 9 months   Thornton Papas, MD  07/15/2012  8:34 AM

## 2012-07-15 NOTE — Telephone Encounter (Signed)
gv pt appt schedule for April 2015.

## 2012-07-15 NOTE — Addendum Note (Signed)
Addended by: Wandalee Ferdinand on: 07/15/2012 09:17 AM   Modules accepted: Orders, Medications

## 2012-07-24 ENCOUNTER — Other Ambulatory Visit: Payer: Self-pay | Admitting: Internal Medicine

## 2012-08-12 ENCOUNTER — Telehealth: Payer: Self-pay | Admitting: *Deleted

## 2012-08-12 NOTE — Telephone Encounter (Signed)
Ok to take excedrin  Thx

## 2012-08-12 NOTE — Telephone Encounter (Signed)
Pt called states she took a Norvasc 5mg  at approx, 8am-9am today she is requesting whether it is safe to take Excedrin Migraine.  Please advise

## 2012-08-13 NOTE — Telephone Encounter (Signed)
Spoke with pt advised her of MDs message.

## 2012-08-25 ENCOUNTER — Other Ambulatory Visit: Payer: Self-pay | Admitting: Internal Medicine

## 2012-08-25 DIAGNOSIS — M545 Low back pain: Secondary | ICD-10-CM

## 2012-08-30 ENCOUNTER — Ambulatory Visit
Admission: RE | Admit: 2012-08-30 | Discharge: 2012-08-30 | Disposition: A | Discharge status: Home / Self Care | Payer: Medicare Other | Source: Ambulatory Visit | Attending: Internal Medicine | Admitting: Internal Medicine

## 2012-08-30 ENCOUNTER — Other Ambulatory Visit: Payer: Self-pay | Admitting: Internal Medicine

## 2012-08-30 DIAGNOSIS — M545 Low back pain: Secondary | ICD-10-CM

## 2012-09-03 ENCOUNTER — Other Ambulatory Visit: Payer: Self-pay | Admitting: Internal Medicine

## 2012-09-05 ENCOUNTER — Telehealth: Payer: Self-pay | Admitting: Gastroenterology

## 2012-09-05 MED ORDER — OMEPRAZOLE 40 MG PO CPDR: 40.0000 mg | DELAYED_RELEASE_CAPSULE | Freq: Two times a day (BID) | ORAL | Status: DC

## 2012-09-05 NOTE — Telephone Encounter (Signed)
Patient advised that she can try decreasing to OD, but if her symptoms return will need to increase to BID

## 2012-09-24 ENCOUNTER — Encounter: Payer: Self-pay | Admitting: Internal Medicine

## 2012-09-24 ENCOUNTER — Ambulatory Visit (INDEPENDENT_AMBULATORY_CARE_PROVIDER_SITE_OTHER): Payer: Medicare Other | Admitting: Internal Medicine

## 2012-09-24 VITALS — BP 130/82 | HR 60 | Temp 98.5°F | Resp 12 | Wt 193.0 lb

## 2012-09-24 DIAGNOSIS — F329 Major depressive disorder, single episode, unspecified: Secondary | ICD-10-CM

## 2012-09-24 DIAGNOSIS — Z23 Encounter for immunization: Secondary | ICD-10-CM

## 2012-09-24 DIAGNOSIS — M545 Low back pain, unspecified: Secondary | ICD-10-CM | POA: Insufficient documentation

## 2012-09-24 DIAGNOSIS — E119 Type 2 diabetes mellitus without complications: Secondary | ICD-10-CM

## 2012-09-24 DIAGNOSIS — E039 Hypothyroidism, unspecified: Secondary | ICD-10-CM

## 2012-09-24 MED ORDER — OXYCODONE-ACETAMINOPHEN 5-325 MG PO TABS: 1.0000 | ORAL_TABLET | Freq: Three times a day (TID) | ORAL | Status: DC | PRN

## 2012-09-24 NOTE — Progress Notes (Signed)
Subjective:    Back Pain This is a chronic problem. The current episode started more than 1 year ago. The problem is unchanged. The pain is present in the lumbar spine. The quality of the pain is described as aching. The pain is at a severity of 7/10. The pain is severe. The symptoms are aggravated by bending and standing. Pertinent negatives include no abdominal pain, chest pain, dysuria, fever, headaches, numbness, pelvic pain or weakness. Risk factors include history of cancer.    F/u L ankle swelling at times, better. F/u HTN and elev CBG. C/o fatigue  Wt Readings from Last 3 Encounters:  09/24/12 193 lb (87.544 kg)  07/15/12 194 lb 12.8 oz (88.361 kg)  06/23/12 195 lb (88.451 kg)   BP Readings from Last 3 Encounters:  09/24/12 130/82  07/15/12 146/85  06/23/12 120/80      Review of Systems  Constitutional: Negative.  Negative for fever, chills, diaphoresis, activity change, appetite change, fatigue and unexpected weight change.  HENT: Negative for hearing loss, ear pain, nosebleeds, congestion, sore throat, facial swelling, rhinorrhea, sneezing, mouth sores, trouble swallowing, neck pain, neck stiffness, postnasal drip, sinus pressure and tinnitus.   Eyes: Negative for pain, discharge, redness, itching and visual disturbance.  Respiratory: Negative for cough, chest tightness, shortness of breath, wheezing and stridor.   Cardiovascular: Negative for chest pain, palpitations and leg swelling.  Gastrointestinal: Negative for nausea, abdominal pain, diarrhea, constipation, blood in stool, abdominal distention, anal bleeding and rectal pain.  Genitourinary: Negative for dysuria, urgency, frequency, hematuria, flank pain, vaginal bleeding, vaginal discharge, difficulty urinating, genital sores and pelvic pain.  Musculoskeletal: Positive for back pain. Negative for joint swelling, arthralgias and gait problem.  Skin: Negative.  Negative for rash.  Neurological: Negative for  dizziness, tremors, seizures, syncope, speech difficulty, weakness, numbness and headaches.  Hematological: Negative for adenopathy. Does not bruise/bleed easily.  Psychiatric/Behavioral: Negative for suicidal ideas, behavioral problems, sleep disturbance, dysphoric mood and decreased concentration. The patient is not nervous/anxious.        Objective:   Physical Exam  Constitutional: She appears well-developed. No distress.  obese  HENT:  Head: Normocephalic.  Right Ear: External ear normal.  Left Ear: External ear normal.  Nose: Nose normal.  Mouth/Throat: Oropharynx is clear and moist.  Eyes: Conjunctivae are normal. Pupils are equal, round, and reactive to light. Right eye exhibits no discharge. Left eye exhibits no discharge.  Neck: Normal range of motion. Neck supple. No JVD present. No tracheal deviation present. No thyromegaly present.  Cardiovascular: Normal rate, regular rhythm and normal heart sounds.   Pulmonary/Chest: No stridor. No respiratory distress. She has no wheezes.  Abdominal: Soft. Bowel sounds are normal. She exhibits no distension and no mass. There is no tenderness. There is no rebound and no guarding.  Musculoskeletal: She exhibits no edema and no tenderness.  Lymphadenopathy:    She has no cervical adenopathy.  Neurological: She displays normal reflexes. No cranial nerve deficit. She exhibits normal muscle tone. Coordination normal.  Skin: No rash noted. No erythema.  Psychiatric: She has a normal mood and affect. Her behavior is normal. Judgment and thought content normal.    Lab Results  Component Value Date   WBC 6.1 11/06/2011   HGB 11.8* 11/06/2011   HCT 36.8 11/06/2011   PLT 237.0 11/06/2011   GLUCOSE 100* 06/24/2012   CHOL 197 06/24/2012   TRIG 91.0 06/24/2012   HDL 44.00 06/24/2012   LDLDIRECT 180.9 07/26/2009   LDLCALC 135* 06/24/2012  ALT 16 06/24/2012   AST 22 06/24/2012   NA 143 06/24/2012   K 3.9 06/24/2012   CL 106 06/24/2012   CREATININE  1.0 06/24/2012   BUN 17 06/24/2012   CO2 29 06/24/2012   TSH 0.74 06/24/2012   HGBA1C 6.5 11/06/2011   8/14 LS MRI: IMPRESSION:  1. Advanced facet disease at L5-S1 with a resulting  anterolisthesis and diffuse disc bulging. 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  2. Slightly lesser facet disease and anterolisthesis at L4-L5 with  mild resulting central, lateral recess and foraminal stenosis  bilaterally.  3. No other significant disc space findings.  Original Report Authenticated By: Carey Bullocks, M.D.      Assessment & Plan:

## 2012-09-24 NOTE — Assessment & Plan Note (Signed)
  On diet  

## 2012-09-24 NOTE — Assessment & Plan Note (Addendum)
8/14 LS MRI: IMPRESSION:  1. Advanced facet disease at L5-S1 with a resulting  anterolisthesis and diffuse disc bulging. 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  2. Slightly lesser facet disease and anterolisthesis at L4-L5 with  mild resulting central, lateral recess and foraminal stenosis  bilaterally.  3. No other significant disc space findings.  Original Report Authenticated By: Carey Bullocks, M.D.  Pain Clinic was suggested by Dr Dierdre Forth Will try Percocet - other options were discussed as well

## 2012-09-24 NOTE — Assessment & Plan Note (Signed)
Continue with current prescription therapy as reflected on the Med list.  

## 2012-10-22 ENCOUNTER — Other Ambulatory Visit: Payer: Self-pay | Admitting: Internal Medicine

## 2012-10-23 ENCOUNTER — Ambulatory Visit: Payer: Medicare Other | Admitting: Internal Medicine

## 2012-11-03 ENCOUNTER — Ambulatory Visit
Admission: RE | Admit: 2012-11-03 | Discharge: 2012-11-03 | Disposition: A | Discharge status: Home / Self Care | Payer: Medicare Other | Source: Ambulatory Visit | Attending: Pain Medicine | Admitting: Pain Medicine

## 2012-11-03 ENCOUNTER — Other Ambulatory Visit: Payer: Self-pay | Admitting: Pain Medicine

## 2012-11-03 DIAGNOSIS — M25551 Pain in right hip: Secondary | ICD-10-CM

## 2012-11-12 ENCOUNTER — Ambulatory Visit (INDEPENDENT_AMBULATORY_CARE_PROVIDER_SITE_OTHER): Payer: Medicare Other | Admitting: Internal Medicine

## 2012-11-12 ENCOUNTER — Encounter: Payer: Self-pay | Admitting: Internal Medicine

## 2012-11-12 ENCOUNTER — Other Ambulatory Visit (INDEPENDENT_AMBULATORY_CARE_PROVIDER_SITE_OTHER): Payer: Medicare Other

## 2012-11-12 VITALS — BP 130/84 | HR 64 | Temp 98.0°F | Resp 16 | Wt 197.0 lb

## 2012-11-12 DIAGNOSIS — M545 Low back pain, unspecified: Secondary | ICD-10-CM

## 2012-11-12 DIAGNOSIS — J069 Acute upper respiratory infection, unspecified: Secondary | ICD-10-CM

## 2012-11-12 DIAGNOSIS — I10 Essential (primary) hypertension: Secondary | ICD-10-CM

## 2012-11-12 LAB — URINALYSIS
Bilirubin Urine: NEGATIVE
Ketones, ur: NEGATIVE
Leukocytes, UA: NEGATIVE
Total Protein, Urine: NEGATIVE
pH: 6 (ref 5.0–8.0)

## 2012-11-12 LAB — BASIC METABOLIC PANEL
BUN: 11 mg/dL (ref 6–23)
Chloride: 103 mEq/L (ref 96–112)
Creatinine, Ser: 0.8 mg/dL (ref 0.4–1.2)
GFR: 86.61 mL/min (ref >60.00)
Glucose, Bld: 93 mg/dL (ref 70–99)
Potassium: 3.7 mEq/L (ref 3.5–5.1)
Sodium: 139 mEq/L (ref 135–145)

## 2012-11-12 MED ORDER — AZITHROMYCIN 250 MG PO TABS: ORAL_TABLET | ORAL | Status: DC

## 2012-11-12 MED ORDER — BENZONATATE 200 MG PO CAPS: 200.0000 mg | ORAL_CAPSULE | Freq: Two times a day (BID) | ORAL | Status: DC | PRN

## 2012-11-12 NOTE — Assessment & Plan Note (Signed)
Continue with current prescription therapy as reflected on the Med list. Labs  

## 2012-11-12 NOTE — Progress Notes (Signed)
Pre-visit discussion using our clinic review tool. No additional management support is needed unless otherwise documented below in the visit note.  

## 2012-11-12 NOTE — Assessment & Plan Note (Signed)
UA

## 2012-11-12 NOTE — Patient Instructions (Signed)
Use over-the-counter  "cold" medicines  such as  "Afrin" nasal spray for nasal congestion as directed instead. Use" Delsym" or" Robitussin" cough syrup varietis for cough.  You can use plain "Tylenol" or "Advil" for fever, chills and achyness.   "Common cold" symptoms are usually triggered by a virus.  The antibiotics are usually not necessary. On average, a" viral cold" illness would take 4-7 days to resolve. Take Z pac if you are worse. Please, make an appointment if you are not better or if you're worse.

## 2012-11-12 NOTE — Assessment & Plan Note (Signed)
Tessalon Zpac if worse

## 2012-11-12 NOTE — Progress Notes (Signed)
Subjective:    Back Pain This is a chronic problem. The current episode started more than 1 year ago. The problem is unchanged. The pain is present in the lumbar spine. The quality of the pain is described as aching. The pain is at a severity of 7/10. The pain is severe. The symptoms are aggravated by bending and standing. Pertinent negatives include no abdominal pain, chest pain, dysuria, fever, headaches, numbness, pelvic pain or weakness. Risk factors include history of cancer.  Cough This is a new problem. The current episode started 1 to 4 weeks ago. The problem occurs every few minutes. The cough is productive of sputum. Pertinent negatives include no chest pain, chills, ear pain, eye redness, fever, headaches, postnasal drip, rash, rhinorrhea, sore throat, shortness of breath or wheezing. There is no history of pneumonia.    F/u L ankle swelling at times, better. F/u HTN and elev CBG. C/o fatigue  Wt Readings from Last 3 Encounters:  11/12/12 197 lb (89.359 kg)  09/24/12 193 lb (87.544 kg)  07/15/12 194 lb 12.8 oz (88.361 kg)   BP Readings from Last 3 Encounters:  11/12/12 130/84  09/24/12 130/82  07/15/12 146/85      Review of Systems  Constitutional: Negative.  Negative for fever, chills, diaphoresis, activity change, appetite change, fatigue and unexpected weight change.  HENT: Negative for congestion, ear pain, facial swelling, hearing loss, mouth sores, nosebleeds, postnasal drip, rhinorrhea, sinus pressure, sneezing, sore throat, tinnitus and trouble swallowing.   Eyes: Negative for pain, discharge, redness, itching and visual disturbance.  Respiratory: Negative for cough, chest tightness, shortness of breath, wheezing and stridor.   Cardiovascular: Negative for chest pain, palpitations and leg swelling.  Gastrointestinal: Negative for nausea, abdominal pain, diarrhea, constipation, blood in stool, abdominal distention, anal bleeding and rectal pain.  Genitourinary:  Negative for dysuria, urgency, frequency, hematuria, flank pain, vaginal bleeding, vaginal discharge, difficulty urinating, genital sores and pelvic pain.  Musculoskeletal: Positive for back pain. Negative for arthralgias, gait problem, joint swelling, neck pain and neck stiffness.  Skin: Negative.  Negative for rash.  Neurological: Negative for dizziness, tremors, seizures, syncope, speech difficulty, weakness, numbness and headaches.  Hematological: Negative for adenopathy. Does not bruise/bleed easily.  Psychiatric/Behavioral: Negative for suicidal ideas, behavioral problems, sleep disturbance, dysphoric mood and decreased concentration. The patient is not nervous/anxious.        Objective:   Physical Exam  Constitutional: She appears well-developed. No distress.  obese  HENT:  Head: Normocephalic.  Right Ear: External ear normal.  Left Ear: External ear normal.  Nose: Nose normal.  Mouth/Throat: Oropharynx is clear and moist.  Eyes: Conjunctivae are normal. Pupils are equal, round, and reactive to light. Right eye exhibits no discharge. Left eye exhibits no discharge.  Neck: Normal range of motion. Neck supple. No JVD present. No tracheal deviation present. No thyromegaly present.  Cardiovascular: Normal rate, regular rhythm and normal heart sounds.   Pulmonary/Chest: No stridor. No respiratory distress. She has no wheezes.  Abdominal: Soft. Bowel sounds are normal. She exhibits no distension and no mass. There is no tenderness. There is no rebound and no guarding.  Musculoskeletal: She exhibits no edema and no tenderness.  Lymphadenopathy:    She has no cervical adenopathy.  Neurological: She displays normal reflexes. No cranial nerve deficit. She exhibits normal muscle tone. Coordination normal.  Skin: No rash noted. No erythema.  Psychiatric: She has a normal mood and affect. Her behavior is normal. Judgment and thought content normal.  eryth  throat  Lab Results  Component  Value Date   WBC 6.1 11/06/2011   HGB 11.8* 11/06/2011   HCT 36.8 11/06/2011   PLT 237.0 11/06/2011   GLUCOSE 100* 06/24/2012   CHOL 197 06/24/2012   TRIG 91.0 06/24/2012   HDL 44.00 06/24/2012   LDLDIRECT 180.9 07/26/2009   LDLCALC 135* 06/24/2012   ALT 16 06/24/2012   AST 22 06/24/2012   NA 143 06/24/2012   K 3.9 06/24/2012   CL 106 06/24/2012   CREATININE 1.0 06/24/2012   BUN 17 06/24/2012   CO2 29 06/24/2012   TSH 0.74 06/24/2012   HGBA1C 6.5 11/06/2011   8/14 LS MRI: IMPRESSION:   1. Advanced facet disease at L5-S1 with a resulting  anterolisthesis and diffuse disc bulging. 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  2. Slightly lesser facet disease and anterolisthesis at L4-L5 with  mild resulting central, lateral recess and foraminal stenosis  bilaterally.  3. No other significant disc space findings.  Original Report Authenticated By: Carey Bullocks, M.D.      Assessment & Plan:

## 2012-11-13 ENCOUNTER — Telehealth: Payer: Self-pay

## 2012-11-13 NOTE — Telephone Encounter (Signed)
Message copied by Eulis Manly on Thu Nov 13, 2012  3:35 PM ------      Message from: Tresa Garter      Created: Thu Nov 13, 2012 12:19 PM       Misty Stanley, please, inform patient that all labs are OK      Thank you!       ------

## 2012-11-13 NOTE — Telephone Encounter (Signed)
Patient informed of test results

## 2012-11-17 ENCOUNTER — Other Ambulatory Visit: Payer: Self-pay | Admitting: *Deleted

## 2012-11-17 MED ORDER — CARVEDILOL 12.5 MG PO TABS: 12.5000 mg | ORAL_TABLET | Freq: Two times a day (BID) | ORAL | Status: DC

## 2012-12-18 ENCOUNTER — Other Ambulatory Visit: Payer: Self-pay | Admitting: Oncology

## 2012-12-18 DIAGNOSIS — Z9889 Other specified postprocedural states: Secondary | ICD-10-CM

## 2012-12-18 DIAGNOSIS — Z853 Personal history of malignant neoplasm of breast: Secondary | ICD-10-CM

## 2013-01-07 ENCOUNTER — Ambulatory Visit
Admission: RE | Admit: 2013-01-07 | Discharge: 2013-01-07 | Disposition: A | Discharge status: Home / Self Care | Payer: Medicare Other | Source: Ambulatory Visit | Attending: Oncology | Admitting: Oncology

## 2013-01-07 DIAGNOSIS — Z9889 Other specified postprocedural states: Secondary | ICD-10-CM

## 2013-01-07 DIAGNOSIS — Z853 Personal history of malignant neoplasm of breast: Secondary | ICD-10-CM

## 2013-02-09 ENCOUNTER — Telehealth: Payer: Self-pay | Admitting: Internal Medicine

## 2013-02-09 MED ORDER — FUROSEMIDE 20 MG PO TABS: 20.0000 mg | ORAL_TABLET | Freq: Every day | ORAL | Status: DC | PRN

## 2013-02-09 NOTE — Telephone Encounter (Signed)
Ok to ref OF if not better Thx

## 2013-02-09 NOTE — Telephone Encounter (Signed)
Patient states that she has a lot of fluid built up in her knee to the point where she cannot bend her leg. The furosemide (LASIX) 20 MG tablet rx was given is now expired and she is calling to see if she can have a new prescription sent to [facility] on file. Please advise.

## 2013-02-09 NOTE — Telephone Encounter (Signed)
Pt informed

## 2013-03-07 ENCOUNTER — Encounter: Payer: Self-pay | Admitting: Oncology

## 2013-03-07 ENCOUNTER — Telehealth: Payer: Self-pay | Admitting: *Deleted

## 2013-03-07 NOTE — Telephone Encounter (Signed)
Mailed provider change letter w/ calendar and cancelled [facility] appt.

## 2013-03-12 ENCOUNTER — Other Ambulatory Visit (INDEPENDENT_AMBULATORY_CARE_PROVIDER_SITE_OTHER): Payer: Medicare Other

## 2013-03-12 ENCOUNTER — Ambulatory Visit (INDEPENDENT_AMBULATORY_CARE_PROVIDER_SITE_OTHER): Payer: Medicare Other | Admitting: Internal Medicine

## 2013-03-12 ENCOUNTER — Encounter: Payer: Self-pay | Admitting: Internal Medicine

## 2013-03-12 VITALS — BP 128/78 | HR 76 | Temp 97.8°F | Resp 16 | Wt 200.0 lb

## 2013-03-12 DIAGNOSIS — E876 Hypokalemia: Secondary | ICD-10-CM

## 2013-03-12 DIAGNOSIS — R5383 Other fatigue: Principal | ICD-10-CM

## 2013-03-12 DIAGNOSIS — E119 Type 2 diabetes mellitus without complications: Secondary | ICD-10-CM

## 2013-03-12 DIAGNOSIS — M069 Rheumatoid arthritis, unspecified: Secondary | ICD-10-CM

## 2013-03-12 DIAGNOSIS — R5381 Other malaise: Secondary | ICD-10-CM

## 2013-03-12 LAB — CBC WITH DIFFERENTIAL/PLATELET
Basophils Absolute: 0 10*3/uL (ref 0.0–0.1)
Basophils Relative: 0.6 % (ref 0.0–3.0)
EOS PCT: 3.7 % (ref 0.0–5.0)
Eosinophils Absolute: 0.2 10*3/uL (ref 0.0–0.7)
HCT: 36.1 % (ref 36.0–46.0)
HEMOGLOBIN: 11.7 g/dL — AB (ref 12.0–15.0)
LYMPHS ABS: 2.1 10*3/uL (ref 0.7–4.0)
Lymphocytes Relative: 32.8 % (ref 12.0–46.0)
MCHC: 32.5 g/dL (ref 30.0–36.0)
MCV: 89.9 fl (ref 78.0–100.0)
MONOS PCT: 9.2 % (ref 3.0–12.0)
Monocytes Absolute: 0.6 10*3/uL (ref 0.1–1.0)
NEUTROS ABS: 3.4 10*3/uL (ref 1.4–7.7)
Neutrophils Relative %: 53.7 % (ref 43.0–77.0)
Platelets: 239 10*3/uL (ref 150.0–400.0)
RBC: 4.02 Mil/uL (ref 3.87–5.11)
RDW: 14.8 % — AB (ref 11.5–14.6)
WBC: 6.4 10*3/uL (ref 4.5–10.5)

## 2013-03-12 LAB — URINALYSIS
Bilirubin Urine: NEGATIVE
KETONES UR: NEGATIVE
Leukocytes, UA: NEGATIVE
Nitrite: NEGATIVE
PH: 6 (ref 5.0–8.0)
Specific Gravity, Urine: <=1.005 — AB (ref 1.000–1.030)
Total Protein, Urine: NEGATIVE
URINE GLUCOSE: NEGATIVE
Urobilinogen, UA: 0.2 (ref 0.0–1.0)

## 2013-03-12 LAB — TSH: TSH: 1.7 u[IU]/mL (ref 0.35–5.50)

## 2013-03-12 LAB — BASIC METABOLIC PANEL
BUN: 10 mg/dL (ref 6–23)
CO2: 30 mEq/L (ref 19–32)
Calcium: 9.7 mg/dL (ref 8.4–10.5)
Chloride: 103 mEq/L (ref 96–112)
Creatinine, Ser: 0.9 mg/dL (ref 0.4–1.2)
GFR: 84.19 mL/min (ref >60.00)
GLUCOSE: 98 mg/dL (ref 70–99)
POTASSIUM: 3.7 meq/L (ref 3.5–5.1)
SODIUM: 139 meq/L (ref 135–145)

## 2013-03-12 LAB — HEMOGLOBIN A1C: HEMOGLOBIN A1C: 6.2 % (ref 4.6–6.5)

## 2013-03-12 LAB — T4, FREE: FREE T4: 0.83 ng/dL (ref 0.60–1.60)

## 2013-03-12 LAB — SEDIMENTATION RATE: Sed Rate: 26 mm/hr — ABNORMAL HIGH (ref 0–22)

## 2013-03-12 MED ORDER — OMEPRAZOLE 40 MG PO CPDR: 40.0000 mg | DELAYED_RELEASE_CAPSULE | Freq: Two times a day (BID) | ORAL | Status: DC

## 2013-03-12 MED ORDER — LEVOTHYROXINE SODIUM 50 MCG PO TABS: ORAL_TABLET | ORAL | Status: DC

## 2013-03-12 NOTE — Assessment & Plan Note (Signed)
Continue with current prescription therapy as reflected on the Med list.  

## 2013-03-12 NOTE — Progress Notes (Signed)
Subjective:    HPI  F/u B ankles swelling at times F/u HTN, RA and elev CBG. C/o fatigue  Wt Readings from Last 3 Encounters:  03/12/13 200 lb (90.719 kg)  11/12/12 197 lb (89.359 kg)  09/24/12 193 lb (87.544 kg)   BP Readings from Last 3 Encounters:  03/12/13 128/78  11/12/12 130/84  09/24/12 130/82      Review of Systems  Constitutional: Negative.  Negative for diaphoresis, activity change, appetite change, fatigue and unexpected weight change.  HENT: Negative for congestion, facial swelling, hearing loss, mouth sores, nosebleeds, sinus pressure, sneezing, tinnitus and trouble swallowing.   Eyes: Negative for pain, discharge, redness, itching and visual disturbance.  Respiratory: Negative for chest tightness and stridor.   Cardiovascular: Negative for palpitations and leg swelling.  Gastrointestinal: Negative for nausea, diarrhea, constipation, blood in stool, abdominal distention, anal bleeding and rectal pain.  Genitourinary: Negative for urgency, frequency, hematuria, flank pain, vaginal bleeding, vaginal discharge, difficulty urinating and genital sores.  Musculoskeletal: Negative for arthralgias, gait problem, joint swelling, neck pain and neck stiffness.  Skin: Negative.   Neurological: Negative for dizziness, tremors, seizures, syncope and speech difficulty.  Hematological: Negative for adenopathy. Does not bruise/bleed easily.  Psychiatric/Behavioral: Negative for suicidal ideas, behavioral problems, sleep disturbance, dysphoric mood and decreased concentration. The patient is not nervous/anxious.        Objective:   Physical Exam  Constitutional: She appears well-developed. No distress.  obese  HENT:  Head: Normocephalic.  Right Ear: External ear normal.  Left Ear: External ear normal.  Nose: Nose normal.  Mouth/Throat: Oropharynx is clear and moist.  Eyes: Conjunctivae are normal. Pupils are equal, round, and reactive to light. Right eye exhibits no  discharge. Left eye exhibits no discharge.  Neck: Normal range of motion. Neck supple. No JVD present. No tracheal deviation present. No thyromegaly present.  Cardiovascular: Normal rate, regular rhythm and normal heart sounds.   Pulmonary/Chest: No stridor. No respiratory distress. She has no wheezes.  Abdominal: Soft. Bowel sounds are normal. She exhibits no distension and no mass. There is no tenderness. There is no rebound and no guarding.  Musculoskeletal: She exhibits no edema and no tenderness.  Lymphadenopathy:    She has no cervical adenopathy.  Neurological: She displays normal reflexes. No cranial nerve deficit. She exhibits normal muscle tone. Coordination normal.  Skin: No rash noted. No erythema.  Psychiatric: She has a normal mood and affect. Her behavior is normal. Judgment and thought content normal.  eryth throat  Lab Results  Component Value Date   WBC 6.1 11/06/2011   HGB 11.8* 11/06/2011   HCT 36.8 11/06/2011   PLT 237.0 11/06/2011   GLUCOSE 93 11/12/2012   CHOL 197 06/24/2012   TRIG 91.0 06/24/2012   HDL 44.00 06/24/2012   LDLDIRECT 180.9 07/26/2009   LDLCALC 135* 06/24/2012   ALT 16 06/24/2012   AST 22 06/24/2012   NA 139 11/12/2012   K 3.7 11/12/2012   CL 103 11/12/2012   CREATININE 0.8 11/12/2012   BUN 11 11/12/2012   CO2 27 11/12/2012   TSH 0.74 06/24/2012   HGBA1C 6.5 11/06/2011   8/14 LS MRI: IMPRESSION:   1. Advanced facet disease at L5-S1 with a resulting  anterolisthesis and diffuse disc bulging. 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  2. Slightly lesser facet disease and anterolisthesis at L4-L5 with  mild resulting central, lateral recess and foraminal stenosis  bilaterally.  3. No other significant disc space findings.  Original Report Authenticated By: Richardean Sale, M.D.      Assessment & Plan:

## 2013-03-12 NOTE — Progress Notes (Signed)
Pre visit review using our clinic review tool, if applicable. No additional management support is needed unless otherwise documented below in the visit note. 

## 2013-03-12 NOTE — Assessment & Plan Note (Signed)
Labs

## 2013-03-12 NOTE — Assessment & Plan Note (Signed)
a1c

## 2013-03-12 NOTE — Assessment & Plan Note (Signed)
Labs  Continue with current prescription therapy as reflected on the Med list.  

## 2013-04-14 ENCOUNTER — Other Ambulatory Visit: Payer: Self-pay | Admitting: *Deleted

## 2013-04-14 ENCOUNTER — Ambulatory Visit: Payer: Medicare Other | Admitting: Internal Medicine

## 2013-04-14 ENCOUNTER — Ambulatory Visit: Payer: Medicare Other | Admitting: Nurse Practitioner

## 2013-04-20 ENCOUNTER — Telehealth: Payer: Self-pay | Admitting: Oncology

## 2013-04-20 NOTE — Telephone Encounter (Signed)
CHANGED STATUS OF 4/7 APPT TO CANCELLED. PER 3/30 POF FROM [facility] PT WISHES TO CX APPT W/MM AND WILL CALL BACK TO R/S W/ANOTHER PROVIDER WHEN SHE IS READY FOR AN APPT. 4/7 POF REQUESTED THAT PT NOT BE PUT BACK ON MM SCHEDULED DELETED.

## 2013-05-19 ENCOUNTER — Encounter (HOSPITAL_COMMUNITY): Payer: Self-pay | Admitting: Emergency Medicine

## 2013-05-19 ENCOUNTER — Emergency Department (INDEPENDENT_AMBULATORY_CARE_PROVIDER_SITE_OTHER)
Admission: EM | Admit: 2013-05-19 | Discharge: 2013-05-19 | Disposition: A | Discharge status: Home / Self Care | Payer: Medicare Other | Source: Home / Self Care | Attending: Family Medicine | Admitting: Family Medicine

## 2013-05-19 DIAGNOSIS — M62838 Other muscle spasm: Secondary | ICD-10-CM

## 2013-05-19 DIAGNOSIS — M25519 Pain in unspecified shoulder: Secondary | ICD-10-CM

## 2013-05-19 NOTE — Discharge Instructions (Signed)
°  Muscle Cramps and Spasms °Muscle cramps and spasms occur when a muscle or muscles tighten and you have no control over this tightening (involuntary muscle contraction). They are a common problem and can develop in any muscle. The most common place is in the calf muscles of the leg. Both muscle cramps and muscle spasms are involuntary muscle contractions, but they also have differences:  °· Muscle cramps are sporadic and painful. They may last a few seconds to a quarter of an hour. Muscle cramps are often more forceful and last longer than muscle spasms. °· Muscle spasms may or may not be painful. They may also last just a few seconds or much longer. °CAUSES  °It is uncommon for cramps or spasms to be due to a serious underlying problem. In many cases, the cause of cramps or spasms is unknown. Some common causes are:  °· Overexertion.   °· Overuse from repetitive motions (doing the same thing over and over).   °· Remaining in a certain position for a long period of time.   °· Improper preparation, form, or technique while performing a sport or activity.   °· Dehydration.   °· Injury.   °· Side effects of some medicines.   °· Abnormally low levels of the salts and ions in your blood (electrolytes), especially potassium and calcium. This could happen if you are taking water pills (diuretics) or you are pregnant.   °Some underlying medical problems can make it more likely to develop cramps or spasms. These include, but are not limited to:  °· Diabetes.   °· Parkinson disease.   °· Hormone disorders, such as thyroid problems.   °· Alcohol abuse.   °· Diseases specific to muscles, joints, and bones.   °· Blood vessel disease where not enough blood is getting to the muscles.   °HOME CARE INSTRUCTIONS  °· Stay well hydrated. Drink enough water and fluids to keep your urine clear or pale yellow. °· It may be helpful to massage, stretch, and relax the affected muscle. °· For tight or tense muscles, use a warm towel, heating  pad, or hot shower water directed to the affected area. °· If you are sore or have pain after a cramp or spasm, applying ice to the affected area may relieve discomfort. °· Put ice in a plastic bag. °· Place a towel between your skin and the bag. °· Leave the ice on for 15-20 minutes, 03-04 times a day. °· Medicines used to treat a known cause of cramps or spasms may help reduce their frequency or severity. Only take over-the-counter or prescription medicines as directed by your caregiver. °SEEK MEDICAL CARE IF:  °Your cramps or spasms get more severe, more frequent, or do not improve over time.  °MAKE SURE YOU:  °· Understand these instructions. °· Will watch your condition. °· Will get help right away if you are not doing well or get worse. °Document Released: 06/16/2001 Document Revised: 04/21/2012 Document Reviewed: 12/12/2011 °[employer]® Patient Information ©2014 [employer]. ° ° °

## 2013-05-19 NOTE — ED Notes (Signed)
Pt reports she woke up w/right shoulder/arm pain onset this am States the pain starts at shoulder and radiates down to arm Sx also include weakness of right arm Denies inj/truama Alert w/no signs of acute distress.

## 2013-05-19 NOTE — ED Provider Notes (Signed)
CSN: [id number]     Arrival date & time 05/19/13  1104 History   First MD Initiated Contact with Patient 05/19/13 1233     Chief Complaint  Patient presents with  . Shoulder Pain  . Arm Pain   (Consider location/radiation/quality/duration/timing/severity/associated sxs/prior Treatment) HPI Comments: 73 year old female presents complaining of right shoulder, upper back, and arm pain. When she woke up this morning, she had some discomfort around her right shoulder blade. This has gotten worse throughout the day, and now she has pain going down her right normal. She is here because she wanted to make sure she is not having a heart attack or stroke. She rates her pain is currently 5-6/10. She denies any other associated symptoms. She has no history of heart attack or stroke. She does have a history of hypertension. She specifically denies chest pain, shortness of breath, blurry vision, speech difficulty, nausea or vomiting, lightheadedness. The pain is made worse with movement of her arms and made worse with pushing on her shoulder.  Patient is a 73 y.o. female presenting with shoulder pain and arm pain.  Shoulder Pain Pertinent negatives include no chest pain, no abdominal pain and no shortness of breath.  Arm Pain Pertinent negatives include no chest pain, no abdominal pain and no shortness of breath.    Past Medical History  Diagnosis Date  . Abdominal pain, generalized 02/05/2008  . ABDOMINAL PAIN-EPIGASTRIC 03/24/2009  . Acute bronchitis 02/15/2007  . BACK PAIN 07/20/2008  . BREAST CANCER, HX OF 04/16/2007  . CHEST PAIN 02/23/2010  . COLONIC POLYPS, ADENOMATOUS, HX OF 10/01/2007  . CONSTIPATION, CHRONIC 05/10/2007  . DEPRESSION 11/25/2007  . Dysuria 04/06/2008  . FATIGUE 12/26/2006  . FATTY LIVER DISEASE 11/19/2006  . FIBROMYALGIA 11/19/2006  . HEMATOCHEZIA 05/10/2007  . HEMORRHOIDS, INTERNAL 05/10/2007  . HIP PAIN 07/20/2008  . HYPERLIPIDEMIA 07/26/2009  . HYPERTENSION 12/26/2006  . HYPOKALEMIA  11/25/2007  . HYPOTHYROIDISM 12/26/2006  . IBS 11/19/2006  . INSOMNIA, PERSISTENT 12/26/2006  . KELOID 07/25/2007  . MURMUR 07/25/2007  . Nausea alone 03/23/2010  . PARESTHESIA 04/06/2008  . RENAL CYST, LEFT 02/23/2010  . Rheumatoid arthritis(714.0) 11/19/2006    Dr Vianne Bulls  . RUQ PAIN 11/07/2009  . 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 GERD 11/19/2006    Dr Fuller Plan  . Breast cancer     l, hx of XRT Dr Benay Spice  . Adenomatous colon polyp 04/2000   Past Surgical History  Procedure Laterality Date  . Left thyroidectomy    . Breast surgery  2009    Left lumpectomy  . Os cataract extraction  2009  . Shoulder surgery      both shoulders  . Tubal ligation    . Knee surgery Left    Family History  Problem Relation Age of Onset  . Colon cancer Brother 65  . Cancer Brother     colon, lung  . Colon cancer Brother 73  . Cancer Brother     colon  . Hypertension Other   . Diabetes Father    History  Substance Use Topics  . Smoking status: Never Smoker   . Smokeless tobacco: Never Used  . Alcohol Use: No   OB History   Grav Para Term Preterm Abortions TAB SAB Ect Mult Living                 Review of Systems  Constitutional: Negative for fever and chills.  Eyes: Negative for visual disturbance.  Respiratory: Negative for  cough and shortness of breath.   Cardiovascular: Negative for chest pain, palpitations and leg swelling.  Gastrointestinal: Negative for nausea, vomiting and abdominal pain.  Endocrine: Negative for polydipsia and polyuria.  Genitourinary: Negative for dysuria, urgency and frequency.  Musculoskeletal: Positive for arthralgias and myalgias.  Skin: Negative for rash.  Neurological: Negative for dizziness, weakness and light-headedness.  All other systems reviewed and are negative.   Allergies  Codeine; Hydrocodone; Lovastatin; and Tramadol hcl  Home Medications   Prior to Admission medications   Medication Sig Start Date End Date  Taking? Authorizing Provider  amLODipine (NORVASC) 5 MG tablet Take 1 tablet (5 mg total) by mouth daily. 06/23/12  Yes Evie Lacks Plotnikov, MD  carvedilol (COREG) 12.5 MG tablet Take 1 tablet (12.5 mg total) by mouth 2 (two) times daily with a meal. 11/17/12  Yes Evie Lacks Plotnikov, MD  furosemide (LASIX) 20 MG tablet Take 1-2 tablets (20-40 mg total) by mouth daily as needed (swelling). 02/09/13  Yes Evie Lacks Plotnikov, MD  levothyroxine (SYNTHROID, LEVOTHROID) 50 MCG tablet TAKE 1 TABLET BY MOUTH EVERY DAY 03/12/13  Yes Evie Lacks Plotnikov, MD  losartan (COZAAR) 100 MG tablet TAKE 1 TABLET BY MOUTH ONCE EVERY DAY 10/22/12  Yes Evie Lacks Plotnikov, MD  omeprazole (PRILOSEC) 40 MG capsule Take 1 capsule (40 mg total) by mouth 2 (two) times daily. 03/12/13  Yes Evie Lacks Plotnikov, MD  acetaminophen (TYLENOL) 325 MG tablet Take 650 mg by mouth as needed.      Historical Provider, MD  Cholecalciferol (VITAMIN D3) 1000 UNITS CAPS Take by mouth daily.      Historical Provider, MD  folic acid (FOLVITE) 1 MG tablet Take 1 mg by mouth daily.    Historical Provider, MD  methotrexate (RHEUMATREX) 2.5 MG tablet Take 10 mg by mouth once a week. Caution:Chemotherapy. Protect from light.    Historical Provider, MD  oxyCODONE-acetaminophen (ROXICET) 5-325 MG per tablet Take 1 tablet by mouth every 8 (eight) hours as needed for pain. 09/24/12   Evie Lacks Plotnikov, MD  polyethylene glycol (MIRALAX / GLYCOLAX) packet Take 17 g by mouth 2 (two) times daily as needed.    Historical Provider, MD  potassium chloride (KLOR-CON 10) 10 MEQ tablet Take 1 tablet (10 mEq total) by mouth daily. 06/23/12 06/23/13  Evie Lacks Plotnikov, MD   BP 174/82  Pulse 71  Temp(Src) 98.4 F (36.9 C) (Oral)  Resp 16  SpO2 97% Physical Exam  Nursing note and vitals reviewed. Constitutional: She is oriented to person, place, and time. Vital signs are normal. She appears well-developed and well-nourished. No distress.  HENT:  Head:  Normocephalic and atraumatic.  Eyes: Conjunctivae and EOM are normal. Pupils are equal, round, and reactive to light.  Neck: Normal range of motion. Neck supple. No JVD present.  Cardiovascular: Normal rate, regular rhythm and normal heart sounds.   Pulses:      Radial pulses are 2+ on the right side, and 2+ on the left side.  Pulmonary/Chest: Effort normal and breath sounds normal. No respiratory distress.  Musculoskeletal:       Right shoulder: She exhibits tenderness (tenderness over the scapula, trapezius muscle, rhomboids. Pain in the shoulder with range of motion. No pain at rest. No palpable crepitus or deformities, no swelling).  Neurological: She is alert and oriented to person, place, and time. She has normal strength and normal reflexes. No cranial nerve deficit or sensory deficit. She exhibits normal muscle tone. She displays a negative Romberg sign.  Coordination and gait normal. GCS eye subscore is 4. GCS verbal subscore is 5. GCS motor subscore is 6.  Skin: Skin is warm and dry. No rash noted. She is not diaphoretic.  Psychiatric: She has a normal mood and affect. Judgment normal.    ED Course  Procedures (including critical care time) Labs Review Labs Reviewed - No data to display  Imaging Review No results found.   MDM   1. Shoulder pain   2. Muscle spasm    Muscular shoulder pain, no signs of stroke or MI. She has Percocet at home, she will take that as needed for the pain. Advised heating pad as well. Followup as needed       Liam Graham, PA-C 05/19/13 1253

## 2013-05-19 NOTE — ED Provider Notes (Signed)
Medical screening examination/treatment/procedure(s) were performed by resident physician or non-physician practitioner and as supervising physician I was immediately available for consultation/collaboration.   Pauline Good MD.   Billy Fischer, MD 05/19/13 [phone]

## 2013-05-26 ENCOUNTER — Other Ambulatory Visit: Payer: Self-pay | Admitting: *Deleted

## 2013-05-26 MED ORDER — LINACLOTIDE 290 MCG PO CAPS: 290.0000 ug | ORAL_CAPSULE | Freq: Every day | ORAL | Status: DC

## 2013-05-26 NOTE — Telephone Encounter (Signed)
290, correct? Will do Thx

## 2013-05-26 NOTE — Telephone Encounter (Signed)
Notified pt yes dosage is 224mcg [facility].../lmb

## 2013-05-26 NOTE — Telephone Encounter (Signed)
Pt called states the Linzess 90mg  was effective.  She is requesting an Rx to be sent in.

## 2013-06-03 ENCOUNTER — Telehealth: Payer: Self-pay

## 2013-06-03 NOTE — Telephone Encounter (Signed)
Received pharmacy rejection stating that insurance will not cover linzess  without a prior authorization. Prior authorization submitted via cover my meds.

## 2013-06-16 ENCOUNTER — Other Ambulatory Visit (INDEPENDENT_AMBULATORY_CARE_PROVIDER_SITE_OTHER): Payer: Medicare Other

## 2013-06-16 ENCOUNTER — Ambulatory Visit (INDEPENDENT_AMBULATORY_CARE_PROVIDER_SITE_OTHER): Payer: Medicare Other | Admitting: Internal Medicine

## 2013-06-16 ENCOUNTER — Encounter: Payer: Self-pay | Admitting: Internal Medicine

## 2013-06-16 VITALS — BP 104/72 | HR 68 | Temp 98.1°F | Resp 16 | Wt 200.0 lb

## 2013-06-16 DIAGNOSIS — E119 Type 2 diabetes mellitus without complications: Secondary | ICD-10-CM

## 2013-06-16 DIAGNOSIS — F329 Major depressive disorder, single episode, unspecified: Secondary | ICD-10-CM

## 2013-06-16 DIAGNOSIS — M069 Rheumatoid arthritis, unspecified: Secondary | ICD-10-CM

## 2013-06-16 DIAGNOSIS — E039 Hypothyroidism, unspecified: Secondary | ICD-10-CM

## 2013-06-16 DIAGNOSIS — F3289 Other specified depressive episodes: Secondary | ICD-10-CM

## 2013-06-16 DIAGNOSIS — R5381 Other malaise: Secondary | ICD-10-CM

## 2013-06-16 DIAGNOSIS — R5383 Other fatigue: Secondary | ICD-10-CM

## 2013-06-16 DIAGNOSIS — I10 Essential (primary) hypertension: Secondary | ICD-10-CM

## 2013-06-16 DIAGNOSIS — IMO0001 Reserved for inherently not codable concepts without codable children: Secondary | ICD-10-CM

## 2013-06-16 LAB — BASIC METABOLIC PANEL
BUN: 10 mg/dL (ref 6–23)
CO2: 28 mEq/L (ref 19–32)
Calcium: 9.7 mg/dL (ref 8.4–10.5)
Chloride: 104 mEq/L (ref 96–112)
Creatinine, Ser: 0.9 mg/dL (ref 0.4–1.2)
GFR: 80.82 mL/min (ref >60.00)
Glucose, Bld: 105 mg/dL — ABNORMAL HIGH (ref 70–99)
POTASSIUM: 4 meq/L (ref 3.5–5.1)
SODIUM: 141 meq/L (ref 135–145)

## 2013-06-16 LAB — HEMOGLOBIN A1C: HEMOGLOBIN A1C: 6.3 % (ref 4.6–6.5)

## 2013-06-16 MED ORDER — DULOXETINE HCL 30 MG PO CPEP: 30.0000 mg | ORAL_CAPSULE | Freq: Every day | ORAL | Status: DC

## 2013-06-16 NOTE — Progress Notes (Signed)
Pre visit review using our clinic review tool, if applicable. No additional management support is needed unless otherwise documented below in the visit note. 

## 2013-06-16 NOTE — Assessment & Plan Note (Signed)
Labs

## 2013-06-16 NOTE — Assessment & Plan Note (Signed)
Labs Try Cymbalta

## 2013-06-16 NOTE — Progress Notes (Signed)
Subjective:    HPI  F/u FMS, IBS - worse  F/u B ankles swelling at times F/u HTN, RA and elev CBG. C/o fatigue  Wt Readings from Last 3 Encounters:  06/16/13 200 lb (90.719 kg)  03/12/13 200 lb (90.719 kg)  11/12/12 197 lb (89.359 kg)   BP Readings from Last 3 Encounters:  06/16/13 104/72  05/19/13 174/82  03/12/13 128/78      Review of Systems  Constitutional: Positive for fatigue. Negative for diaphoresis, activity change, appetite change and unexpected weight change.  HENT: Negative for congestion, facial swelling, hearing loss, mouth sores, nosebleeds, sinus pressure, sneezing, tinnitus and trouble swallowing.   Eyes: Negative for pain, discharge, redness, itching and visual disturbance.  Respiratory: Negative for cough, chest tightness and stridor.   Cardiovascular: Negative for palpitations and leg swelling.  Gastrointestinal: Positive for constipation and abdominal distention. Negative for nausea, vomiting, diarrhea, blood in stool, anal bleeding and rectal pain.  Genitourinary: Negative for urgency, frequency, hematuria, flank pain, vaginal bleeding, vaginal discharge, difficulty urinating and genital sores.  Musculoskeletal: Positive for arthralgias. Negative for gait problem, joint swelling, neck pain and neck stiffness.  Skin: Negative.   Neurological: Negative for dizziness, tremors, seizures, syncope and speech difficulty.  Hematological: Negative for adenopathy. Does not bruise/bleed easily.  Psychiatric/Behavioral: Negative for suicidal ideas, behavioral problems, sleep disturbance, dysphoric mood and decreased concentration. The patient is not nervous/anxious.        Objective:   Physical Exam  Constitutional: She appears well-developed. No distress.  obese  HENT:  Head: Normocephalic.  Right Ear: External ear normal.  Left Ear: External ear normal.  Nose: Nose normal.  Mouth/Throat: Oropharynx is clear and moist.  Eyes: Conjunctivae are normal.  Pupils are equal, round, and reactive to light. Right eye exhibits no discharge. Left eye exhibits no discharge.  Neck: Normal range of motion. Neck supple. No JVD present. No tracheal deviation present. No thyromegaly present.  Cardiovascular: Normal rate, regular rhythm and normal heart sounds.   Pulmonary/Chest: No stridor. No respiratory distress. She has no wheezes.  Abdominal: Soft. Bowel sounds are normal. She exhibits no distension and no mass. There is no tenderness. There is no rebound and no guarding.  Musculoskeletal: She exhibits no edema and no tenderness.  Lymphadenopathy:    She has no cervical adenopathy.  Neurological: She displays normal reflexes. No cranial nerve deficit. She exhibits normal muscle tone. Coordination normal.  Skin: No rash noted. No erythema.  Psychiatric: Her behavior is normal. Judgment and thought content normal.  sad    Lab Results  Component Value Date   WBC 6.4 03/12/2013   HGB 11.7* 03/12/2013   HCT 36.1 03/12/2013   PLT 239.0 03/12/2013   GLUCOSE 98 03/12/2013   CHOL 197 06/24/2012   TRIG 91.0 06/24/2012   HDL 44.00 06/24/2012   LDLDIRECT 180.9 07/26/2009   LDLCALC 135* 06/24/2012   ALT 16 06/24/2012   AST 22 06/24/2012   NA 139 03/12/2013   K 3.7 03/12/2013   CL 103 03/12/2013   CREATININE 0.9 03/12/2013   BUN 10 03/12/2013   CO2 30 03/12/2013   TSH 1.70 03/12/2013   HGBA1C 6.2 03/12/2013   8/14 LS MRI: IMPRESSION:   1. Advanced facet disease at L5-S1 with a resulting  anterolisthesis and diffuse disc bulging. 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  2. Slightly lesser facet disease and anterolisthesis at L4-L5 with  mild resulting central, lateral  recess and foraminal stenosis  bilaterally.  3. No other significant disc space findings.  Original Report Authenticated By: Richardean Sale, M.D.      Assessment & Plan:

## 2013-06-16 NOTE — Assessment & Plan Note (Signed)
F/u w/Dr Beekman 

## 2013-06-16 NOTE — Assessment & Plan Note (Signed)
Continue with current prescription therapy as reflected on the Med list.  

## 2013-06-16 NOTE — Patient Instructions (Signed)
Gluten free trial (no wheat products) for 4-6 weeks. OK to use gluten-free bread and gluten-free pasta.  Milk free trial (no milk, ice cream, cheese and yogurt) for 4-6 weeks. OK to use almond, coconut, rice or soy milk. "Almond breeze" brand tastes good.  

## 2013-06-16 NOTE — Assessment & Plan Note (Signed)
Will start Cymbalta

## 2013-06-16 NOTE — Assessment & Plan Note (Signed)
Try Cymbalty

## 2013-06-17 ENCOUNTER — Telehealth: Payer: Self-pay | Admitting: Internal Medicine

## 2013-06-17 NOTE — Telephone Encounter (Signed)
Relevant patient education mailed to patient.  

## 2013-06-29 ENCOUNTER — Encounter: Payer: Self-pay | Admitting: Gastroenterology

## 2013-06-29 ENCOUNTER — Telehealth: Payer: Self-pay | Admitting: Gastroenterology

## 2013-06-29 NOTE — Telephone Encounter (Signed)
Informed patient we no longer have samples of Align but we do have coupons if she would like me to mail them to her. Patient states that is great and would love to get them in the mail since she does not have transportation.

## 2013-07-03 NOTE — Telephone Encounter (Signed)
error 

## 2013-07-08 ENCOUNTER — Telehealth: Payer: Self-pay | Admitting: *Deleted

## 2013-07-08 DIAGNOSIS — E119 Type 2 diabetes mellitus without complications: Secondary | ICD-10-CM

## 2013-07-08 NOTE — Telephone Encounter (Signed)
Left message on machine for patient to call our office. Lipid panel ordered.

## 2013-07-09 ENCOUNTER — Other Ambulatory Visit: Payer: Self-pay | Admitting: Internal Medicine

## 2013-07-14 ENCOUNTER — Other Ambulatory Visit (INDEPENDENT_AMBULATORY_CARE_PROVIDER_SITE_OTHER): Payer: Medicare Other

## 2013-07-14 DIAGNOSIS — E119 Type 2 diabetes mellitus without complications: Secondary | ICD-10-CM

## 2013-07-14 LAB — LIPID PANEL
CHOLESTEROL: 198 mg/dL (ref 0–200)
HDL: 46.5 mg/dL (ref >39.00)
LDL Cholesterol: 135 mg/dL — ABNORMAL HIGH (ref 0–99)
NonHDL: 151.5
TRIGLYCERIDES: 82 mg/dL (ref 0.0–149.0)
Total CHOL/HDL Ratio: 4
VLDL: 16.4 mg/dL (ref 0.0–40.0)

## 2013-07-14 LAB — BASIC METABOLIC PANEL WITH GFR
BUN: 13 mg/dL (ref 6–23)
CO2: 30 meq/L (ref 19–32)
Calcium: 9.2 mg/dL (ref 8.4–10.5)
Chloride: 104 meq/L (ref 96–112)
Creatinine, Ser: 0.9 mg/dL (ref 0.4–1.2)
GFR: 81.88 mL/min
Glucose, Bld: 110 mg/dL — ABNORMAL HIGH (ref 70–99)
Potassium: 3.3 meq/L — ABNORMAL LOW (ref 3.5–5.1)
Sodium: 141 meq/L (ref 135–145)

## 2013-07-14 LAB — HEMOGLOBIN A1C: Hgb A1c MFr Bld: 6.3 % (ref 4.6–6.5)

## 2013-08-02 ENCOUNTER — Other Ambulatory Visit: Payer: Self-pay | Admitting: Internal Medicine

## 2013-08-24 ENCOUNTER — Encounter: Payer: Self-pay | Admitting: Gastroenterology

## 2013-08-27 ENCOUNTER — Telehealth: Payer: Self-pay | Admitting: Gastroenterology

## 2013-08-27 NOTE — Telephone Encounter (Signed)
Patient given recommendations as per Dr. Fuller Plan.

## 2013-08-27 NOTE — Telephone Encounter (Signed)
See my last office note. Even if Miralax is not working she should stay on it 3-4 times a day every day. She can take a bottle of Mag Citrate or Ducolax pills every few days if Miralax alone is not effective. If she cannot come to see [place] for further care she can follow up with her PCP for mgmt of constipation.

## 2013-08-27 NOTE — Telephone Encounter (Signed)
Patient calling to report she is having problems with constipation. She has tried Miralax up to 4 times/day, Linzess 290 mcg and dulcolax. None of these worked for her. She does report she took a bottle of magnesium citrate and had one bowel movement. All of the other medications just made her bloat.She states she does not have any money for copay to come for OV and has very little money for medications. She is asking for any suggestions for something OTC. She will try Magnesium Citrate one bottle again.

## 2013-08-28 ENCOUNTER — Other Ambulatory Visit: Payer: Self-pay | Admitting: Internal Medicine

## 2013-08-29 ENCOUNTER — Other Ambulatory Visit: Payer: Self-pay | Admitting: Internal Medicine

## 2013-09-17 ENCOUNTER — Encounter: Payer: Self-pay | Admitting: Internal Medicine

## 2013-09-17 ENCOUNTER — Ambulatory Visit (INDEPENDENT_AMBULATORY_CARE_PROVIDER_SITE_OTHER): Payer: Medicare Other | Admitting: Internal Medicine

## 2013-09-17 VITALS — BP 142/80 | HR 63 | Temp 98.1°F | Ht 65.0 in | Wt 204.2 lb

## 2013-09-17 DIAGNOSIS — M069 Rheumatoid arthritis, unspecified: Secondary | ICD-10-CM

## 2013-09-17 DIAGNOSIS — I10 Essential (primary) hypertension: Secondary | ICD-10-CM

## 2013-09-17 DIAGNOSIS — R609 Edema, unspecified: Secondary | ICD-10-CM

## 2013-09-17 DIAGNOSIS — E039 Hypothyroidism, unspecified: Secondary | ICD-10-CM

## 2013-09-17 DIAGNOSIS — IMO0001 Reserved for inherently not codable concepts without codable children: Secondary | ICD-10-CM

## 2013-09-17 DIAGNOSIS — K5909 Other constipation: Secondary | ICD-10-CM

## 2013-09-17 DIAGNOSIS — E119 Type 2 diabetes mellitus without complications: Secondary | ICD-10-CM

## 2013-09-17 DIAGNOSIS — M549 Dorsalgia, unspecified: Secondary | ICD-10-CM

## 2013-09-17 MED ORDER — AMLODIPINE BESYLATE 5 MG PO TABS: 5.0000 mg | ORAL_TABLET | Freq: Every day | ORAL | Status: DC

## 2013-09-17 MED ORDER — CARVEDILOL 12.5 MG PO TABS: 12.5000 mg | ORAL_TABLET | Freq: Two times a day (BID) | ORAL | Status: DC

## 2013-09-17 MED ORDER — LUBIPROSTONE 24 MCG PO CAPS: 24.0000 ug | ORAL_CAPSULE | Freq: Two times a day (BID) | ORAL | Status: DC

## 2013-09-17 NOTE — Progress Notes (Signed)
Pre visit review using our clinic review tool, if applicable. No additional management support is needed unless otherwise documented below in the visit note. 

## 2013-09-17 NOTE — Assessment & Plan Note (Signed)
Continue with current prescription therapy as reflected on the Med list.  

## 2013-09-17 NOTE — Assessment & Plan Note (Signed)
On Duloxetine, Aleve prn

## 2013-09-17 NOTE — Assessment & Plan Note (Signed)
Continue with current prescription therapy as reflected on the Med list. Labs  

## 2013-09-17 NOTE — Progress Notes (Signed)
Subjective:    HPI  F/u FMS - using Aleve prn, IBS - not worse  F/u B ankles swelling at times F/u HTN, RA and elev CBG. C/o fatigue  Wt Readings from Last 3 Encounters:  09/17/13 204 lb 4 oz (92.647 kg)  06/16/13 200 lb (90.719 kg)  03/12/13 200 lb (90.719 kg)   BP Readings from Last 3 Encounters:  09/17/13 142/80  06/16/13 104/72  05/19/13 174/82      Review of Systems  Constitutional: Positive for fatigue. Negative for diaphoresis, activity change, appetite change and unexpected weight change.  HENT: Negative for congestion, facial swelling, hearing loss, mouth sores, nosebleeds, sinus pressure, sneezing, tinnitus and trouble swallowing.   Eyes: Negative for pain, discharge, redness, itching and visual disturbance.  Respiratory: Negative for cough, chest tightness and stridor.   Cardiovascular: Negative for palpitations and leg swelling.  Gastrointestinal: Positive for constipation and abdominal distention. Negative for nausea, vomiting, diarrhea, blood in stool, anal bleeding and rectal pain.  Genitourinary: Negative for urgency, frequency, hematuria, flank pain, vaginal bleeding, vaginal discharge, difficulty urinating and genital sores.  Musculoskeletal: Positive for arthralgias. Negative for gait problem, joint swelling, neck pain and neck stiffness.  Skin: Negative.   Neurological: Negative for dizziness, tremors, seizures, syncope and speech difficulty.  Hematological: Negative for adenopathy. Does not bruise/bleed easily.  Psychiatric/Behavioral: Negative for suicidal ideas, behavioral problems, sleep disturbance, dysphoric mood and decreased concentration. The patient is not nervous/anxious.        Objective:   Physical Exam  Constitutional: She appears well-developed. No distress.  obese  HENT:  Head: Normocephalic.  Right Ear: External ear normal.  Left Ear: External ear normal.  Nose: Nose normal.  Mouth/Throat: Oropharynx is clear and moist.  Eyes:  Conjunctivae are normal. Pupils are equal, round, and reactive to light. Right eye exhibits no discharge. Left eye exhibits no discharge.  Neck: Normal range of motion. Neck supple. No JVD present. No tracheal deviation present. No thyromegaly present.  Cardiovascular: Normal rate, regular rhythm and normal heart sounds.   Pulmonary/Chest: No stridor. No respiratory distress. She has no wheezes.  Abdominal: Soft. Bowel sounds are normal. She exhibits no distension and no mass. There is no tenderness. There is no rebound and no guarding.  Musculoskeletal: She exhibits no edema and no tenderness.  Lymphadenopathy:    She has no cervical adenopathy.  Neurological: She displays normal reflexes. No cranial nerve deficit. She exhibits normal muscle tone. Coordination normal.  Skin: No rash noted. No erythema.  Psychiatric: Her behavior is normal. Judgment and thought content normal.  sad    Lab Results  Component Value Date   WBC 6.4 03/12/2013   HGB 11.7* 03/12/2013   HCT 36.1 03/12/2013   PLT 239.0 03/12/2013   GLUCOSE 110* 07/14/2013   CHOL 198 07/14/2013   TRIG 82.0 07/14/2013   HDL 46.50 07/14/2013   LDLDIRECT 180.9 07/26/2009   LDLCALC 135* 07/14/2013   ALT 16 06/24/2012   AST 22 06/24/2012   NA 141 07/14/2013   K 3.3* 07/14/2013   CL 104 07/14/2013   CREATININE 0.9 07/14/2013   BUN 13 07/14/2013   CO2 30 07/14/2013   TSH 1.70 03/12/2013   HGBA1C 6.3 07/14/2013   8/14 LS MRI: IMPRESSION:   1. Advanced facet disease at L5-S1 with a resulting  anterolisthesis and diffuse disc bulging. 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  2. Slightly lesser facet disease and anterolisthesis at  L4-L5 with  mild resulting central, lateral recess and foraminal stenosis  bilaterally.  3. No other significant disc space findings.  Original Report Authenticated By: Richardean Sale, M.D.      Assessment & Plan:

## 2013-09-17 NOTE — Assessment & Plan Note (Signed)
L>R ankle ?due to amlodipine Discussed

## 2013-09-17 NOTE — Assessment & Plan Note (Signed)
Try Amitiza 24 bid

## 2013-09-17 NOTE — Assessment & Plan Note (Signed)
Aleve prn 

## 2013-09-17 NOTE — Assessment & Plan Note (Signed)
F/u w/Dr Beekman 

## 2013-09-24 ENCOUNTER — Other Ambulatory Visit: Payer: Self-pay | Admitting: Gastroenterology

## 2013-09-28 ENCOUNTER — Emergency Department (HOSPITAL_COMMUNITY): Payer: Medicare Other

## 2013-09-28 ENCOUNTER — Inpatient Hospital Stay (HOSPITAL_COMMUNITY): Payer: Medicare Other

## 2013-09-28 ENCOUNTER — Encounter (HOSPITAL_COMMUNITY): Payer: Self-pay | Admitting: Emergency Medicine

## 2013-09-28 ENCOUNTER — Observation Stay (HOSPITAL_COMMUNITY)
Admission: EM | Admit: 2013-09-28 | Discharge: 2013-09-29 | Disposition: A | Discharge status: Home / Self Care | Payer: Medicare Other | Attending: Internal Medicine | Admitting: Internal Medicine

## 2013-09-28 DIAGNOSIS — Z8709 Personal history of other diseases of the respiratory system: Secondary | ICD-10-CM | POA: Diagnosis not present

## 2013-09-28 DIAGNOSIS — N281 Cyst of kidney, acquired: Secondary | ICD-10-CM | POA: Insufficient documentation

## 2013-09-28 DIAGNOSIS — H9209 Otalgia, unspecified ear: Secondary | ICD-10-CM | POA: Insufficient documentation

## 2013-09-28 DIAGNOSIS — R079 Chest pain, unspecified: Secondary | ICD-10-CM | POA: Diagnosis not present

## 2013-09-28 DIAGNOSIS — K5900 Constipation, unspecified: Secondary | ICD-10-CM | POA: Diagnosis not present

## 2013-09-28 DIAGNOSIS — I10 Essential (primary) hypertension: Secondary | ICD-10-CM | POA: Insufficient documentation

## 2013-09-28 DIAGNOSIS — R42 Dizziness and giddiness: Secondary | ICD-10-CM | POA: Insufficient documentation

## 2013-09-28 DIAGNOSIS — E119 Type 2 diabetes mellitus without complications: Secondary | ICD-10-CM | POA: Diagnosis not present

## 2013-09-28 DIAGNOSIS — E876 Hypokalemia: Secondary | ICD-10-CM | POA: Diagnosis not present

## 2013-09-28 DIAGNOSIS — K648 Other hemorrhoids: Secondary | ICD-10-CM | POA: Diagnosis not present

## 2013-09-28 DIAGNOSIS — F3289 Other specified depressive episodes: Secondary | ICD-10-CM | POA: Insufficient documentation

## 2013-09-28 DIAGNOSIS — K7689 Other specified diseases of liver: Secondary | ICD-10-CM | POA: Diagnosis not present

## 2013-09-28 DIAGNOSIS — Z860100 Personal history of colon polyps, unspecified: Secondary | ICD-10-CM | POA: Insufficient documentation

## 2013-09-28 DIAGNOSIS — K921 Melena: Secondary | ICD-10-CM | POA: Insufficient documentation

## 2013-09-28 DIAGNOSIS — G4700 Insomnia, unspecified: Secondary | ICD-10-CM | POA: Diagnosis not present

## 2013-09-28 DIAGNOSIS — E039 Hypothyroidism, unspecified: Secondary | ICD-10-CM | POA: Diagnosis not present

## 2013-09-28 DIAGNOSIS — F329 Major depressive disorder, single episode, unspecified: Secondary | ICD-10-CM | POA: Diagnosis not present

## 2013-09-28 DIAGNOSIS — H5450 Low vision, one eye, unspecified eye: Secondary | ICD-10-CM | POA: Insufficient documentation

## 2013-09-28 DIAGNOSIS — Z853 Personal history of malignant neoplasm of breast: Secondary | ICD-10-CM | POA: Diagnosis not present

## 2013-09-28 DIAGNOSIS — M542 Cervicalgia: Secondary | ICD-10-CM | POA: Diagnosis not present

## 2013-09-28 DIAGNOSIS — K589 Irritable bowel syndrome without diarrhea: Secondary | ICD-10-CM | POA: Diagnosis not present

## 2013-09-28 DIAGNOSIS — R51 Headache: Secondary | ICD-10-CM | POA: Insufficient documentation

## 2013-09-28 DIAGNOSIS — Z872 Personal history of diseases of the skin and subcutaneous tissue: Secondary | ICD-10-CM | POA: Diagnosis not present

## 2013-09-28 DIAGNOSIS — Z79899 Other long term (current) drug therapy: Secondary | ICD-10-CM | POA: Diagnosis not present

## 2013-09-28 DIAGNOSIS — K219 Gastro-esophageal reflux disease without esophagitis: Secondary | ICD-10-CM | POA: Diagnosis not present

## 2013-09-28 DIAGNOSIS — E785 Hyperlipidemia, unspecified: Secondary | ICD-10-CM | POA: Insufficient documentation

## 2013-09-28 DIAGNOSIS — M069 Rheumatoid arthritis, unspecified: Secondary | ICD-10-CM | POA: Diagnosis not present

## 2013-09-28 DIAGNOSIS — R55 Syncope and collapse: Secondary | ICD-10-CM | POA: Diagnosis not present

## 2013-09-28 DIAGNOSIS — R011 Cardiac murmur, unspecified: Secondary | ICD-10-CM | POA: Insufficient documentation

## 2013-09-28 LAB — TSH: TSH: 0.142 u[IU]/mL — ABNORMAL LOW (ref 0.350–4.500)

## 2013-09-28 LAB — BASIC METABOLIC PANEL
ANION GAP: 12 (ref 5–15)
BUN: 14 mg/dL (ref 6–23)
CALCIUM: 9.8 mg/dL (ref 8.4–10.5)
CHLORIDE: 105 meq/L (ref 96–112)
CO2: 26 meq/L (ref 19–32)
Creatinine, Ser: 0.87 mg/dL (ref 0.50–1.10)
GFR calc Af Amer: 74 mL/min — ABNORMAL LOW (ref >90)
GFR calc non Af Amer: 64 mL/min — ABNORMAL LOW (ref >90)
Glucose, Bld: 122 mg/dL — ABNORMAL HIGH (ref 70–99)
Potassium: 3.8 mEq/L (ref 3.7–5.3)
Sodium: 143 mEq/L (ref 137–147)

## 2013-09-28 LAB — CBG MONITORING, ED: GLUCOSE-CAPILLARY: 116 mg/dL — AB (ref 70–99)

## 2013-09-28 LAB — CBC
HEMATOCRIT: 36.2 % (ref 36.0–46.0)
HEMOGLOBIN: 11.7 g/dL — AB (ref 12.0–15.0)
MCH: 28.5 pg (ref 26.0–34.0)
MCHC: 32.3 g/dL (ref 30.0–36.0)
MCV: 88.1 fL (ref 78.0–100.0)
Platelets: 255 10*3/uL (ref 150–400)
RBC: 4.11 MIL/uL (ref 3.87–5.11)
RDW: 14.1 % (ref 11.5–15.5)
WBC: 5.8 10*3/uL (ref 4.0–10.5)

## 2013-09-28 LAB — TROPONIN I

## 2013-09-28 LAB — I-STAT TROPONIN, ED: TROPONIN I, POC: 0 ng/mL (ref 0.00–0.08)

## 2013-09-28 MED ORDER — ACETAMINOPHEN 325 MG PO TABS
650.0000 mg | ORAL_TABLET | Freq: Four times a day (QID) | ORAL | Status: DC | PRN
  Administered 2013-09-29: 650 mg via ORAL
  Filled 2013-09-28: qty 2

## 2013-09-28 MED ORDER — METHOTREXATE 2.5 MG PO TABS
10.0000 mg | ORAL_TABLET | ORAL | Status: DC
  Administered 2013-09-28: 10 mg via ORAL
  Filled 2013-09-28: qty 4

## 2013-09-28 MED ORDER — POLYETHYLENE GLYCOL 3350 17 G PO PACK
17.0000 g | PACK | Freq: Two times a day (BID) | ORAL | Status: DC | PRN
  Filled 2013-09-28: qty 1

## 2013-09-28 MED ORDER — ACETAMINOPHEN 325 MG PO TABS: 650.0000 mg | ORAL_TABLET | ORAL | Status: DC | PRN

## 2013-09-28 MED ORDER — LUBIPROSTONE 24 MCG PO CAPS
24.0000 ug | ORAL_CAPSULE | Freq: Two times a day (BID) | ORAL | Status: DC
  Filled 2013-09-28 (×4): qty 1

## 2013-09-28 MED ORDER — ONDANSETRON HCL 4 MG/2ML IJ SOLN: 4.0000 mg | Freq: Four times a day (QID) | INTRAMUSCULAR | Status: DC | PRN

## 2013-09-28 MED ORDER — ONDANSETRON HCL 4 MG PO TABS: 4.0000 mg | ORAL_TABLET | Freq: Four times a day (QID) | ORAL | Status: DC | PRN

## 2013-09-28 MED ORDER — ENOXAPARIN SODIUM 40 MG/0.4ML ~~LOC~~ SOLN
40.0000 mg | SUBCUTANEOUS | Status: DC
  Administered 2013-09-28: 40 mg via SUBCUTANEOUS
  Filled 2013-09-28 (×2): qty 0.4

## 2013-09-28 MED ORDER — LEVOTHYROXINE SODIUM 50 MCG PO TABS
50.0000 ug | ORAL_TABLET | Freq: Every day | ORAL | Status: DC
  Administered 2013-09-29: 50 ug via ORAL
  Filled 2013-09-28 (×2): qty 1

## 2013-09-28 MED ORDER — CARVEDILOL 12.5 MG PO TABS
12.5000 mg | ORAL_TABLET | Freq: Two times a day (BID) | ORAL | Status: DC
  Administered 2013-09-29 (×2): 12.5 mg via ORAL
  Filled 2013-09-28 (×3): qty 1

## 2013-09-28 MED ORDER — SODIUM CHLORIDE 0.9 % IV SOLN
INTRAVENOUS | Status: DC
  Administered 2013-09-28: 20:00:00 via INTRAVENOUS

## 2013-09-28 MED ORDER — PANTOPRAZOLE SODIUM 40 MG PO TBEC
40.0000 mg | DELAYED_RELEASE_TABLET | Freq: Every day | ORAL | Status: DC
Start: 2013-09-28 — End: 2013-09-29
  Administered 2013-09-29: 40 mg via ORAL
  Filled 2013-09-28: qty 1

## 2013-09-28 NOTE — ED Notes (Signed)
CBG 116  

## 2013-09-28 NOTE — ED Notes (Signed)
Pt taken to MRI prior to transport to floor

## 2013-09-28 NOTE — H&P (Signed)
[facility] History and Physical  Deanna Salazar:[id number] DOB: 1940/09/29 DOA: 09/28/2013  Referring physician: EDP PCP: Walker Kehr, MD   Chief Complaint: feel faint and dizziness  HPI: Deanna Salazar is a 73 y.o. female with prior h/o hypertension, hypothyroidism, came infor persistent dizziness and feeling faint today. She reports an episode 2 weeks ago. She never passed out as per  The family. On arrival to ED, she underwent CT head without contrast which did not reveal any acute pathology. CXR does not reveal any pneumonia. EKG shows sinus rhythm with LVH. Troponin is negative. Labs revealed mild anemia. She is referred to medical service for admission for evaluation of near syncope. MRI done did not reveal any acute infarct.    Review of Systems:  Constitutional:  No weight loss, night sweats, Fevers, chills, fatigue.  HEENT:  No headaches, Difficulty swallowing,Tooth/dental problems,Sore throat,  No sneezing, itching, ear ache, nasal congestion, post nasal drip,  Cardio-vascular:  Dizziness no chest pain or sob.   GI:  No heartburn, indigestion, abdominal pain, nausea, vomiting, diarrhea, change in bowel habits, loss of appetite  Resp:  No shortness of breath with exertion or at rest. No excess mucus, no productive cough, No non-productive cough, No coughing up of blood.No change in color of mucus.No wheezing.No chest wall deformity  Skin:  no rash or lesions.  GU:  no dysuria, change in color of urine, no urgency or frequency. No flank pain.  Musculoskeletal:  No joint pain or swelling. No decreased range of motion. No back pain.  Psych:  No change in mood or affect. No depression or anxiety. No memory loss.   Past Medical History  Diagnosis Date  . Abdominal pain, generalized 02/05/2008  . ABDOMINAL PAIN-EPIGASTRIC 03/24/2009  . Acute bronchitis 02/15/2007  . BACK PAIN 07/20/2008  . BREAST CANCER, HX OF 04/16/2007  . CHEST PAIN 02/23/2010  . COLONIC POLYPS,  ADENOMATOUS, HX OF 10/01/2007  . CONSTIPATION, CHRONIC 05/10/2007  . DEPRESSION 11/25/2007  . Dysuria 04/06/2008  . FATIGUE 12/26/2006  . FATTY LIVER DISEASE 11/19/2006  . FIBROMYALGIA 11/19/2006  . HEMATOCHEZIA 05/10/2007  . HEMORRHOIDS, INTERNAL 05/10/2007  . HIP PAIN 07/20/2008  . HYPERLIPIDEMIA 07/26/2009  . HYPERTENSION 12/26/2006  . HYPOKALEMIA 11/25/2007  . HYPOTHYROIDISM 12/26/2006  . IBS 11/19/2006  . INSOMNIA, PERSISTENT 12/26/2006  . KELOID 07/25/2007  . MURMUR 07/25/2007  . Nausea alone 03/23/2010  . PARESTHESIA 04/06/2008  . RENAL CYST, LEFT 02/23/2010  . Rheumatoid arthritis(714.0) 11/19/2006    Dr Vianne Bulls  . RUQ PAIN 11/07/2009  . 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 GERD 11/19/2006    Dr Fuller Plan  . Breast cancer     l, hx of XRT Dr Benay Spice  . Adenomatous colon polyp 04/2000   Past Surgical History  Procedure Laterality Date  . Left thyroidectomy    . Breast surgery  2009    Left lumpectomy  . Os cataract extraction  2009  . Shoulder surgery      both shoulders  . Tubal ligation    . Knee surgery Left    Social History:  reports that she has never smoked. She has never used smokeless tobacco. She reports that she does not drink alcohol or use illicit drugs.  Allergies  Allergen Reactions  . Codeine     REACTION: nausea after a week or so: can take it short term  . Hydrocodone     REACTION: nausea  . Lovastatin     cramps  .  Tramadol Hcl     REACTION: itching    Family History  Problem Relation Age of Onset  . Colon cancer Brother 64  . Cancer Brother     colon, lung  . Colon cancer Brother 15  . Cancer Brother     colon  . Hypertension Other   . Diabetes Father      Prior to Admission medications   Medication Sig Start Date End Date Taking? Authorizing Provider  acetaminophen (TYLENOL) 325 MG tablet Take 650 mg by mouth as needed for headache.    Yes Historical Provider, MD  amLODipine (NORVASC) 5 MG tablet Take 1 tablet (5 mg  total) by mouth daily. 09/17/13  Yes Aleksei Plotnikov V, MD  carvedilol (COREG) 12.5 MG tablet Take 1 tablet (12.5 mg total) by mouth 2 (two) times daily with a meal. 09/17/13  Yes Aleksei Plotnikov V, MD  Cholecalciferol (VITAMIN D3) 1000 UNITS CAPS Take 1,000 Units by mouth daily.    Yes Historical Provider, MD  folic acid (FOLVITE) 1 MG tablet Take 3 mg by mouth daily.    Yes Historical Provider, MD  furosemide (LASIX) 20 MG tablet Take 20 mg by mouth daily.   Yes Historical Provider, MD  levothyroxine (SYNTHROID, LEVOTHROID) 50 MCG tablet Take 50 mcg by mouth daily before breakfast.   Yes Historical Provider, MD  losartan (COZAAR) 100 MG tablet Take 100 mg by mouth daily.   Yes Historical Provider, MD  lubiprostone (AMITIZA) 24 MCG capsule Take 1 capsule (24 mcg total) by mouth 2 (two) times daily with a meal. 09/17/13  Yes Aleksei Plotnikov V, MD  methotrexate (RHEUMATREX) 2.5 MG tablet Take 10 mg by mouth once a week. Caution:Chemotherapy. Protect from light. On momdays   Yes Historical Provider, MD  naproxen sodium (ANAPROX) 220 MG tablet Take 220 mg by mouth 2 (two) times daily as needed (pain).   Yes Historical Provider, MD  omeprazole (PRILOSEC) 40 MG capsule Take 1 capsule (40 mg total) by mouth 2 (two) times daily. 03/12/13  Yes Aleksei Plotnikov V, MD  polyethylene glycol (MIRALAX / GLYCOLAX) packet Take 17 g by mouth 2 (two) times daily as needed (constiaption).    Yes Historical Provider, MD  potassium chloride (K-DUR,KLOR-CON) 10 MEQ tablet Take 10 mEq by mouth daily.   Yes Historical Provider, MD   Physical Exam: Filed Vitals:   09/28/13 1242 09/28/13 1245 09/28/13 1623 09/28/13 1845  BP: 149/72 157/85 126/68 169/77  Pulse:  75    Temp:    98.1 F (36.7 C)  TempSrc:    Oral  Resp: 16 19 18 18   Height:      Weight:      SpO2: 98% 99% 100% 98%    Wt Readings from Last 3 Encounters:  09/28/13 92.534 kg (204 lb)  09/17/13 92.647 kg (204 lb 4 oz)  06/16/13 90.719 kg (200 lb)      General:  Appears calm and comfortable Eyes: PERRL, normal lids, irises & conjunctiva ENT: grossly normal hearing, lips & tongue Neck: no LAD, masses or thyromegaly Cardiovascular: RRR, no m/r/g. No LE edema. Telemetry: SR, no arrhythmias  Respiratory: CTA bilaterally, no w/r/r. Normal respiratory effort. Abdomen: soft, ntnd Skin: no rash or induration seen on limited exam Musculoskeletal: grossly normal tone BUE/BLE Psychiatric: grossly normal mood and affect, speech fluent and appropriate Neurologic: grossly non-focal.          Labs on Admission:  Basic Metabolic Panel:  Recent Labs Lab 09/28/13 1009  NA 143  K 3.8  CL 105  CO2 26  GLUCOSE 122*  BUN 14  CREATININE 0.87  CALCIUM 9.8   Liver Function Tests: No results found for this basename: AST, ALT, ALKPHOS, BILITOT, PROT, ALBUMIN,  in the last 168 hours No results found for this basename: LIPASE, AMYLASE,  in the last 168 hours No results found for this basename: AMMONIA,  in the last 168 hours CBC:  Recent Labs Lab 09/28/13 1009  WBC 5.8  HGB 11.7*  HCT 36.2  MCV 88.1  PLT 255   Cardiac Enzymes: No results found for this basename: CKTOTAL, CKMB, CKMBINDEX, TROPONINI,  in the last 168 hours  BNP (last 3 results) No results found for this basename: PROBNP,  in the last 8760 hours CBG:  Recent Labs Lab 09/28/13 1017  GLUCAP 116*    Radiological Exams on Admission: Dg Chest 2 View  09/28/2013   CLINICAL DATA:  Dizziness and headache.  EXAM: CHEST  2 VIEW  COMPARISON:  11/16/2011.  FINDINGS: Surgical clips are seen in the expected location of the left lobe of the thyroid. Trachea is midline. Heart is at the upper limits of normal in size. Lungs are clear. No pleural fluid.  IMPRESSION: No acute findings.   Electronically Signed   By: Lorin Picket M.D.   On: 09/28/2013 13:52   Ct Head Wo Contrast  09/28/2013   CLINICAL DATA:  Severely hypertensive and dizzy  EXAM: CT HEAD WITHOUT CONTRAST   TECHNIQUE: Contiguous axial images were obtained from the base of the skull through the vertex without intravenous contrast.  COMPARISON:  None.  FINDINGS: There is no evidence of mass effect, midline shift or extra-axial fluid collections. There is no evidence of a space-occupying lesion or intracranial hemorrhage. There is no evidence of a cortical-based area of acute infarction.  The ventricles and sulci are appropriate for the patient's age. The basal cisterns are patent.  Visualized portions of the orbits are unremarkable. The visualized portions of the paranasal sinuses and mastoid air cells are unremarkable.  The osseous structures are unremarkable.  IMPRESSION: Normal CT of the brain without intravenous contrast.   Electronically Signed   By: Kathreen Devoid   On: 09/28/2013 12:03   Mr Brain Wo Contrast  09/28/2013   CLINICAL DATA:  73 year old female presenting with dizziness for 3-4 days, increased today.  EXAM: MRI HEAD WITHOUT CONTRAST  TECHNIQUE: Multiplanar, multiecho pulse sequences of the brain and surrounding structures were obtained without intravenous contrast.  COMPARISON:  09/28/2013 CT.  No comparison MR.  FINDINGS: No acute infarct.  No intracranial hemorrhage.  Very mild small vessel disease type changes.  No intracranial mass lesion noted on this unenhanced exam.  No hydrocephalus.  Major intracranial vascular structures are patent.  Cervical medullary junction, pituitary region, pineal region and orbital structures unremarkable.  Minimal paranasal sinus mucosal thickening. 8 mm polypoid opacification medial aspect left maxillary sinus.  IMPRESSION: No acute infarct.  Very mild small vessel disease type changes.   Electronically Signed   By: Chauncey Cruel M.D.   On: 09/28/2013 19:03    EKG: sinus with LVH.  Assessment/Plan Active Problems:   Near syncope   Near syncope: Admit to telemetry. EKG shows sinus rhythm with LVH, serial troponins, echocardiogram . Monitor on tele for  arrythmias. Orthostatic vital signs negative.  Hydration and thyroid panel.  MRI negative for stroke.    Hypertension: controlled.   Hypothyroidism: tsh, free t4 and free t3 ordered.    DVT prophylaxis.  Code Status: full code.  DVT Prophylaxis: Family Communication: family at bedside.  Disposition Plan: pending.   Time spent: 55 min  [facility] Pager [phone]

## 2013-09-28 NOTE — Progress Notes (Signed)
Attempted report. ED RN will call back.

## 2013-09-28 NOTE — ED Provider Notes (Signed)
CSN: [id number]     Arrival date & time 09/28/13  0093 History   First MD Initiated Contact with Patient 09/28/13 1017     Chief Complaint  Patient presents with  . Dizziness  . Headache     (Consider location/radiation/quality/duration/timing/severity/associated sxs/prior Treatment) HPI Deanna Salazar is a 73 year old female with past medical history of hypertension, hyperlipidemia who presents with 3-4 days of a "heaviness" in her left neck, left shoulder, left chest along with worsening dizziness over the past few days. Patient states her heaviness began gradually, and she describes it as being constant in nature. Patient describes her dizziness as a "lightheadedness" and states it is worse with walking. Patient states she was walking in her house, and her lightheadedness was severe enough that she was "walking to the left" without being able to control which direction she was going. Patient states she to "hold onto the wall" because she was so lightheaded. Patient denies shortness of breath, palpitations, syncope, nausea, abdominal pain, dysuria, diarrhea, vaginal discharge/bleeding.   Past Medical History  Diagnosis Date  . Abdominal pain, generalized 02/05/2008  . ABDOMINAL PAIN-EPIGASTRIC 03/24/2009  . Acute bronchitis 02/15/2007  . BACK PAIN 07/20/2008  . BREAST CANCER, HX OF 04/16/2007  . CHEST PAIN 02/23/2010  . COLONIC POLYPS, ADENOMATOUS, HX OF 10/01/2007  . CONSTIPATION, CHRONIC 05/10/2007  . DEPRESSION 11/25/2007  . Dysuria 04/06/2008  . FATIGUE 12/26/2006  . FATTY LIVER DISEASE 11/19/2006  . FIBROMYALGIA 11/19/2006  . HEMATOCHEZIA 05/10/2007  . HEMORRHOIDS, INTERNAL 05/10/2007  . HIP PAIN 07/20/2008  . HYPERLIPIDEMIA 07/26/2009  . HYPERTENSION 12/26/2006  . HYPOKALEMIA 11/25/2007  . HYPOTHYROIDISM 12/26/2006  . IBS 11/19/2006  . INSOMNIA, PERSISTENT 12/26/2006  . KELOID 07/25/2007  . MURMUR 07/25/2007  . Nausea alone 03/23/2010  . PARESTHESIA 04/06/2008  . RENAL CYST, LEFT 02/23/2010  .  Rheumatoid arthritis(714.0) 11/19/2006    Dr Vianne Bulls  . RUQ PAIN 11/07/2009  . 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 GERD 11/19/2006    Dr Fuller Plan  . Breast cancer     l, hx of XRT Dr Benay Spice  . Adenomatous colon polyp 04/2000   Past Surgical History  Procedure Laterality Date  . Left thyroidectomy    . Breast surgery  2009    Left lumpectomy  . Os cataract extraction  2009  . Shoulder surgery      both shoulders  . Tubal ligation    . Knee surgery Left    Family History  Problem Relation Age of Onset  . Colon cancer Brother 91  . Cancer Brother     colon, lung  . Colon cancer Brother 107  . Cancer Brother     colon  . Hypertension Other   . Diabetes Father    History  Substance Use Topics  . Smoking status: Never Smoker   . Smokeless tobacco: Never Used  . Alcohol Use: No   OB History   Grav Para Term Preterm Abortions TAB SAB Ect Mult Living                 Review of Systems  Constitutional: Negative for fever.  HENT: Positive for ear pain. Negative for trouble swallowing.   Eyes: Negative for visual disturbance.  Respiratory: Negative for shortness of breath.   Cardiovascular: Positive for chest pain.  Gastrointestinal: Negative for nausea, vomiting and abdominal pain.  Genitourinary: Negative for dysuria.  Musculoskeletal: Positive for arthralgias and neck pain.  Skin: Negative for rash.  Neurological: Positive for  dizziness and light-headedness. Negative for syncope, weakness and numbness.  Psychiatric/Behavioral: Negative.       Allergies  Codeine; Hydrocodone; Lovastatin; and Tramadol hcl  Home Medications   Prior to Admission medications   Medication Sig Start Date End Date Taking? Authorizing Provider  acetaminophen (TYLENOL) 325 MG tablet Take 650 mg by mouth as needed for headache.    Yes Historical Provider, MD  amLODipine (NORVASC) 5 MG tablet Take 1 tablet (5 mg total) by mouth daily. 09/17/13  Yes Aleksei Plotnikov V,  MD  carvedilol (COREG) 12.5 MG tablet Take 1 tablet (12.5 mg total) by mouth 2 (two) times daily with a meal. 09/17/13  Yes Aleksei Plotnikov V, MD  Cholecalciferol (VITAMIN D3) 1000 UNITS CAPS Take 1,000 Units by mouth daily.    Yes Historical Provider, MD  folic acid (FOLVITE) 1 MG tablet Take 3 mg by mouth daily.    Yes Historical Provider, MD  furosemide (LASIX) 20 MG tablet Take 20 mg by mouth daily.   Yes Historical Provider, MD  losartan (COZAAR) 100 MG tablet Take 100 mg by mouth daily.   Yes Historical Provider, MD  lubiprostone (AMITIZA) 24 MCG capsule Take 1 capsule (24 mcg total) by mouth 2 (two) times daily with a meal. 09/17/13  Yes Aleksei Plotnikov V, MD  methotrexate (RHEUMATREX) 2.5 MG tablet Take 10 mg by mouth once a week. Caution:Chemotherapy. Protect from light. On momdays   Yes Historical Provider, MD  naproxen sodium (ANAPROX) 220 MG tablet Take 220 mg by mouth 2 (two) times daily as needed (pain).   Yes Historical Provider, MD  omeprazole (PRILOSEC) 40 MG capsule Take 1 capsule (40 mg total) by mouth 2 (two) times daily. 03/12/13  Yes Aleksei Plotnikov V, MD  polyethylene glycol (MIRALAX / GLYCOLAX) packet Take 17 g by mouth 2 (two) times daily as needed (constiaption).    Yes Historical Provider, MD  potassium chloride (K-DUR,KLOR-CON) 10 MEQ tablet Take 10 mEq by mouth daily.   Yes Historical Provider, MD  amoxicillin-clavulanate (AUGMENTIN) 875-125 MG per tablet Take 1 tablet by mouth 2 (two) times daily. 09/29/13 10/06/13  Lacy Duverney, PA-C  levothyroxine (SYNTHROID, LEVOTHROID) 50 MCG tablet Take 0.5 tablets (25 mcg total) by mouth daily before breakfast. 09/29/13   Lacy Duverney, PA-C  meclizine (ANTIVERT) 25 MG tablet Take 1 tablet (25 mg total) by mouth 3 (three) times daily as needed for dizziness or nausea. 09/29/13 09/29/14  Sahar Osman, PA-C   BP 140/72  Pulse 62  Temp(Src) 99 F (37.2 C) (Oral)  Resp 16  Ht 5\' 3"  (1.6 m)  Wt 199 lb (90.266 kg)  BMI 35.26 kg/m2   SpO2 99% Physical Exam  Nursing note and vitals reviewed. Constitutional: She is oriented to person, place, and time. She appears well-developed and well-nourished. No distress.  HENT:  Head: Normocephalic and atraumatic.  Mouth/Throat: Oropharynx is clear and moist. No oropharyngeal exudate.  Eyes: EOM are normal. Pupils are equal, round, and reactive to light. Right eye exhibits no discharge. Left eye exhibits no discharge. No scleral icterus.  Neck: Normal range of motion.  Cardiovascular: Normal rate, regular rhythm and S2 normal.   Murmur heard.  Systolic murmur is present with a grade of 2/6  Pulmonary/Chest: Effort normal and breath sounds normal. No respiratory distress.  Abdominal: Soft. There is no tenderness.  Musculoskeletal: Normal range of motion. She exhibits no edema and no tenderness.  Neurological: She is alert and oriented to person, place, and time. No cranial nerve deficit.  Coordination normal.  Skin: Skin is warm and dry. No rash noted. She is not diaphoretic.  Psychiatric: She has a normal mood and affect.    ED Course  Procedures (including critical care time) Labs Review Labs Reviewed  CBC - Abnormal; Notable for the following:    Hemoglobin 11.7 (*)    All other components within normal limits  BASIC METABOLIC PANEL - Abnormal; Notable for the following:    Glucose, Bld 122 (*)    GFR calc non Af Amer 64 (*)    GFR calc Af Amer 74 (*)    All other components within normal limits  TSH - Abnormal; Notable for the following:    TSH 0.142 (*)    All other components within normal limits  T4, FREE - Abnormal; Notable for the following:    Free T4 0.78 (*)    All other components within normal limits  HEMOGLOBIN A1C - Abnormal; Notable for the following:    Hemoglobin A1C 6.4 (*)    Mean Plasma Glucose 137 (*)    All other components within normal limits  CBG MONITORING, ED - Abnormal; Notable for the following:    Glucose-Capillary 116 (*)    All other  components within normal limits  T3, FREE  TROPONIN I  TROPONIN I  TROPONIN I  Randolm Idol, ED    Imaging Review Mr Brain Wo Contrast  09/28/2013   CLINICAL DATA:  73 year old female presenting with dizziness for 3-4 days, increased today.  EXAM: MRI HEAD WITHOUT CONTRAST  TECHNIQUE: Multiplanar, multiecho pulse sequences of the brain and surrounding structures were obtained without intravenous contrast.  COMPARISON:  09/28/2013 CT.  No comparison MR.  FINDINGS: No acute infarct.  No intracranial hemorrhage.  Very mild small vessel disease type changes.  No intracranial mass lesion noted on this unenhanced exam.  No hydrocephalus.  Major intracranial vascular structures are patent.  Cervical medullary junction, pituitary region, pineal region and orbital structures unremarkable.  Minimal paranasal sinus mucosal thickening. 8 mm polypoid opacification medial aspect left maxillary sinus.  IMPRESSION: No acute infarct.  Very mild small vessel disease type changes.   Electronically Signed   By: Chauncey Cruel M.D.   On: 09/28/2013 19:03     EKG Interpretation   Date/Time:  Monday September 28 2013 09:58:16 EDT Ventricular Rate:  83 PR Interval:  148 QRS Duration: 110 QT Interval:  410 QTC Calculation: 481 R Axis:   -47 Text Interpretation:  Normal sinus rhythm Left anterior fascicular block  Moderate voltage criteria for LVH, may be normal variant Abnormal ECG No  significant change since last tracing Confirmed by [place]  MD, [place]  ([id number]) on 09/28/2013 10:21:49 AM      MDM   Final diagnoses:  Near syncope  Unspecified essential hypertension  Unspecified hypothyroidism  Diabetes type 2, controlled    3-4 days of "heaviness" to her neck, left shoulder and left chest. Patient also complaining of worsening lightheadedness when walking. Patient states her lightheadedness became severe enough she was "holding onto the wall" in her house. She states dizziness resolves with sitting  down, resting however heaviness in her neck, arm, chest have been constant. Workup to rule out ACS, TIA, CVA. Neuro exam benign in the ER.    Troponins negative, chest x-ray without any acute findings, head CT without any acute findings. No leukocytosis. Patient has hemoglobin of 11.7, which is at baseline for previous values. We will place consult to medicine for admission for observation.  Medicine agrees to admit patient for observation for possible TIA status post near-syncopal episode. The patient appears reasonably stabilized for admission considering the current resources, flow, and capabilities available in the ED at this time, and I doubt any other [facility] requiring further screening and/or treatment in the ED prior to admission.   Signed,  Dahlia Bailiff, PA-C 4:50 PM   This patient seen and discussed with Dr. Sherwood Gambler, MD.     Carrie Mew, PA-C 09/30/13 1651

## 2013-09-28 NOTE — ED Notes (Signed)
Attempted report 

## 2013-09-28 NOTE — ED Notes (Signed)
Pt c/o dizziness x 3-4 days increased today. Today pt experiencing heaviness to left side of head to shoulder. Pt reports lack of coordination when ambulating today. Speech clear, equal grips.

## 2013-09-28 NOTE — ED Notes (Signed)
Pt returned from MRI- transporting to floor at this time.

## 2013-09-29 DIAGNOSIS — R55 Syncope and collapse: Principal | ICD-10-CM

## 2013-09-29 DIAGNOSIS — E119 Type 2 diabetes mellitus without complications: Secondary | ICD-10-CM

## 2013-09-29 DIAGNOSIS — I517 Cardiomegaly: Secondary | ICD-10-CM

## 2013-09-29 LAB — HEMOGLOBIN A1C
Hgb A1c MFr Bld: 6.4 % — ABNORMAL HIGH (ref <5.7)
Mean Plasma Glucose: 137 mg/dL — ABNORMAL HIGH (ref <117)

## 2013-09-29 LAB — T4, FREE: Free T4: 0.78 ng/dL — ABNORMAL LOW (ref 0.80–1.80)

## 2013-09-29 LAB — T3, FREE: T3, Free: 2.7 pg/mL (ref 2.3–4.2)

## 2013-09-29 LAB — TROPONIN I

## 2013-09-29 MED ORDER — MECLIZINE HCL 25 MG PO TABS: 25.0000 mg | ORAL_TABLET | Freq: Three times a day (TID) | ORAL | Status: DC | PRN

## 2013-09-29 MED ORDER — AMOXICILLIN-POT CLAVULANATE 875-125 MG PO TABS: 1.0000 | ORAL_TABLET | Freq: Two times a day (BID) | ORAL | Status: AC

## 2013-09-29 MED ORDER — AMOXICILLIN 500 MG PO CAPS
500.0000 mg | ORAL_CAPSULE | Freq: Two times a day (BID) | ORAL | Status: DC
  Administered 2013-09-29: 500 mg via ORAL
  Filled 2013-09-29 (×2): qty 1

## 2013-09-29 MED ORDER — MECLIZINE HCL 25 MG PO TABS
50.0000 mg | ORAL_TABLET | Freq: Once | ORAL | Status: AC
  Administered 2013-09-29: 50 mg via ORAL
  Filled 2013-09-29: qty 2

## 2013-09-29 MED ORDER — LEVOTHYROXINE SODIUM 25 MCG PO TABS
25.0000 ug | ORAL_TABLET | Freq: Every day | ORAL | Status: DC
  Filled 2013-09-29: qty 1

## 2013-09-29 MED ORDER — MAGNESIUM SULFATE 40 MG/ML IJ SOLN
2.0000 g | Freq: Once | INTRAMUSCULAR | Status: DC
  Filled 2013-09-29: qty 50

## 2013-09-29 MED ORDER — LEVOTHYROXINE SODIUM 50 MCG PO TABS: 25.0000 ug | ORAL_TABLET | Freq: Every day | ORAL | Status: DC

## 2013-09-29 NOTE — Progress Notes (Signed)
  Echocardiogram 2D Echocardiogram has been performed.  Diamond Nickel 09/29/2013, 9:28 AM

## 2013-09-29 NOTE — Progress Notes (Signed)
Patient discharged home with family. Discharge instructions given and reviewed with patient and family member.

## 2013-09-29 NOTE — Discharge Summary (Signed)
-   I agree with plan in the as stated above. - Episodes of dizziness worse with head turning to the left that lasted about a minute. - Start her on antivert. - Followup with PCP as an outpatient. Her TSH was low 0.1, decreased dose of Synthroid to 25 mcg daily

## 2013-09-29 NOTE — Discharge Summary (Signed)
Physician Discharge Summary  Deanna Salazar:[id number] DOB: 1940/10/13 DOA: 09/28/2013   PCP: Deanna Kehr, MD  Admit date: 09/28/2013 Discharge date: 09/29/2013  Time spent: 40 minutes  Recommendations for Outpatient Follow-up:  1. Follow up with PCP for management of diastolic heart failure 2. Discharge home  Discharge Diagnoses:  Active Problems:   HYPOTHYROIDISM   HYPERLIPIDEMIA   HYPERTENSION   Near syncope   Discharge Condition: Stable  Diet recommendation: Heart healthy  Filed Weights   09/28/13 0958 09/29/13 0429  Weight: 92.534 kg (204 lb) 90.266 kg (199 lb)    History of present illness:   Deanna Salazar is a 73 y.o. female with PMH of hypertension, Hypothyroidism who presented with persistent dizziness and feeling faint today. She reports an episode 2 weeks ago. She never passed out according to patient and family. In ED, workup included head CT and CXR, and MRI of head which were all negative. EKG shows sinus rhythm with LVH. Troponin is negative. Patient admitted for further workup.  Hospital Course:   Near syncope -Presented with dizziness and feeling faint, denies LOC or fall- symptoms resolved and didn't recur. -MRI- negative for stroke.   2D echo 65-70%, increased in wall thickness consistent with LVH, grade 1 diastolic dysfunction.  EKG- sinus rhythm with LVH.  Negative cardiac markers and no evidence of orthostatic hypotension.  -On cardiac monitoring, there was a question about an arrhythmia ([place]).  However, cardiologist consulted doesn't think this is an arrhythmia but rather in an artifact.  -Discharged home per PT recommendation   Hypertension -BPs stable -continue home medication of amlodipine, Carvedilol, and Losartan. -Follow up with PCP for HTN management  Hyperthyroidism -TSH low at 0.142 -Decreased home regimen of levothyroxine to 25 mcg BID -Follow up TSH recheck in 4-6 weeks   Sinusitis -complains of sinus pain and  headache for past two weeks, associated with congestion and mucoid green rhinorrhea -started on Augmentin 500 PO BID for 7 days.   Rheumatoid Arthritis -continue methotrexate 10 mg PO  GERD Continue regimen of pantoprazole  Procedures:  2D echocardiogram- EF 65%; Wall thickness was increased in a pattern of mild LVH. Systolic function was  vigorous. There was no dynamic obstruction, there were no regional wall motion abnormalities.  Doppler parameters are consistent with abnormal left ventricular relaxation (grade 1 diastolic  dysfunction)  Consultations:  Cardiology  Discharge Exam: Filed Vitals:   09/29/13 0429  BP: 107/51  Pulse: 62  Temp: 99 F (37.2 C)  Resp: 16     Exam General: Alert and oriented AA female, in NAD, sitting comfortably in bed HEENT: Anicteric account. Bilateral frontal and maxillary sinus tender with palpation Cardiovascular: Regular rate and rhythm.  No murmurs, rubs, or gallops. Respiratory: Clear to auscultate bilaterally.  No rhonchi or crepitations. Abdomen: Soft nontender bowel sounds present. No guarding or rigidity.  Musculoskeletal: No edema.  Psychiatric: Appears normal.  Neurologic: Alert awake oriented to time place and person.    Discharge Instructions     Medication List         acetaminophen 325 MG tablet  Commonly known as:  TYLENOL  Take 650 mg by mouth as needed for headache.     amLODipine 5 MG tablet  Commonly known as:  NORVASC  Take 1 tablet (5 mg total) by mouth daily.     amoxicillin-clavulanate 875-125 MG per tablet  Commonly known as:  AUGMENTIN  Take 1 tablet by mouth 2 (two) times daily.     carvedilol  12.5 MG tablet  Commonly known as:  COREG  Take 1 tablet (12.5 mg total) by mouth 2 (two) times daily with a meal.     folic acid 1 MG tablet  Commonly known as:  FOLVITE  Take 3 mg by mouth daily.     furosemide 20 MG tablet  Commonly known as:  LASIX  Take 20 mg by mouth daily.     levothyroxine  50 MCG tablet  Commonly known as:  SYNTHROID, LEVOTHROID  Take 0.5 tablets (25 mcg total) by mouth daily before breakfast.     losartan 100 MG tablet  Commonly known as:  COZAAR  Take 100 mg by mouth daily.     lubiprostone 24 MCG capsule  Commonly known as:  AMITIZA  Take 1 capsule (24 mcg total) by mouth 2 (two) times daily with a meal.     meclizine 25 MG tablet  Commonly known as:  ANTIVERT  Take 1 tablet (25 mg total) by mouth 3 (three) times daily as needed for dizziness or nausea.     methotrexate 2.5 MG tablet  Commonly known as:  RHEUMATREX  - Take 10 mg by mouth once a week. Caution:Chemotherapy. Protect from light.  - On momdays     naproxen sodium 220 MG tablet  Commonly known as:  ANAPROX  Take 220 mg by mouth 2 (two) times daily as needed (pain).     omeprazole 40 MG capsule  Commonly known as:  PRILOSEC  Take 1 capsule (40 mg total) by mouth 2 (two) times daily.     polyethylene glycol packet  Commonly known as:  MIRALAX / GLYCOLAX  Take 17 g by mouth 2 (two) times daily as needed (constiaption).     potassium chloride 10 MEQ tablet  Commonly known as:  K-DUR,KLOR-CON  Take 10 mEq by mouth daily.     Vitamin D3 1000 UNITS Caps  Take 1,000 Units by mouth daily.       Allergies  Allergen Reactions  . Codeine     REACTION: nausea after a week or so: can take it short term  . Hydrocodone     REACTION: nausea  . Lovastatin     cramps  . Tramadol Hcl     REACTION: itching   Follow-up Information   Follow up with Deanna Kehr, MD.   Specialty:  Internal Medicine   Contact information:   [place] [address] [phone]        The results of significant diagnostics from this hospitalization (including imaging, microbiology, ancillary and laboratory) are listed below for reference.    Significant Diagnostic Studies: Dg Chest 2 View  09/28/2013   CLINICAL DATA:  Dizziness and headache.  EXAM: CHEST  2 VIEW  COMPARISON:   11/16/2011.  FINDINGS: Surgical clips are seen in the expected location of the left lobe of the thyroid. Trachea is midline. Heart is at the upper limits of normal in size. Lungs are clear. No pleural fluid.  IMPRESSION: No acute findings.   Electronically Signed   By: Lorin Picket M.D.   On: 09/28/2013 13:52   Ct Head Wo Contrast  09/28/2013   CLINICAL DATA:  Severely hypertensive and dizzy  EXAM: CT HEAD WITHOUT CONTRAST  TECHNIQUE: Contiguous axial images were obtained from the base of the skull through the vertex without intravenous contrast.  COMPARISON:  None.  FINDINGS: There is no evidence of mass effect, midline shift or extra-axial fluid collections. There is no evidence  of a space-occupying lesion or intracranial hemorrhage. There is no evidence of a cortical-based area of acute infarction.  The ventricles and sulci are appropriate for the patient's age. The basal cisterns are patent.  Visualized portions of the orbits are unremarkable. The visualized portions of the paranasal sinuses and mastoid air cells are unremarkable.  The osseous structures are unremarkable.  IMPRESSION: Normal CT of the brain without intravenous contrast.   Electronically Signed   By: Kathreen Devoid   On: 09/28/2013 12:03   Mr Brain Wo Contrast  09/28/2013   CLINICAL DATA:  73 year old female presenting with dizziness for 3-4 days, increased today.  EXAM: MRI HEAD WITHOUT CONTRAST  TECHNIQUE: Multiplanar, multiecho pulse sequences of the brain and surrounding structures were obtained without intravenous contrast.  COMPARISON:  09/28/2013 CT.  No comparison MR.  FINDINGS: No acute infarct.  No intracranial hemorrhage.  Very mild small vessel disease type changes.  No intracranial mass lesion noted on this unenhanced exam.  No hydrocephalus.  Major intracranial vascular structures are patent.  Cervical medullary junction, pituitary region, pineal region and orbital structures unremarkable.  Minimal paranasal sinus mucosal  thickening. 8 mm polypoid opacification medial aspect left maxillary sinus.  IMPRESSION: No acute infarct.  Very mild small vessel disease type changes.   Electronically Signed   By: Chauncey Cruel M.D.   On: 09/28/2013 19:03    Microbiology: No results found for this or any previous visit (from the past 240 hour(s)).   Labs: Basic Metabolic Panel:  Recent Labs Lab 09/28/13 1009  NA 143  K 3.8  CL 105  CO2 26  GLUCOSE 122*  BUN 14  CREATININE 0.87  CALCIUM 9.8   Liver Function Tests: No results found for this basename: AST, ALT, ALKPHOS, BILITOT, PROT, ALBUMIN,  in the last 168 hours No results found for this basename: LIPASE, AMYLASE,  in the last 168 hours No results found for this basename: AMMONIA,  in the last 168 hours CBC:  Recent Labs Lab 09/28/13 1009  WBC 5.8  HGB 11.7*  HCT 36.2  MCV 88.1  PLT 255   Cardiac Enzymes:  Recent Labs Lab 09/28/13 1916 09/29/13 0028 09/29/13 0800  TROPONINI <0.30 <0.30 <0.30   BNP: BNP (last 3 results) No results found for this basename: PROBNP,  in the last 8760 hours CBG:  Recent Labs Lab 09/28/13 1017  GLUCAP 116*       Signed:  Lacy Duverney PA-C  [facility] 09/29/2013, 3:43 PM

## 2013-09-29 NOTE — Care Management Note (Unsigned)
    Page 1 of 1   09/29/2013     12:34:00 PM CARE MANAGEMENT NOTE 09/29/2013  Patient:  Deanna Salazar, Deanna Salazar   Account Number:  [id number]  Date Initiated:  09/29/2013  Documentation initiated by:  GRAVES-BIGELOW,Concepcion Gillott  Subjective/Objective Assessment:   Pt admitted for dizziness.     Action/Plan:   CM will continue to monitor for disposition needs.   Anticipated DC Date:  09/30/2013   Anticipated DC Plan:  HOME/SELF CARE         Choice offered to / List presented to:             Status of service:   Medicare Important Message given?   (If response is "NO", the following Medicare IM given date fields will be blank) Date Medicare IM given:   Medicare IM given by:   Date Additional Medicare IM given:   Additional Medicare IM given by:    Discharge Disposition:    Per UR Regulation:  Reviewed for med. necessity/level of care/duration of stay  If discussed at [place] of Stay Meetings, dates discussed:    Comments:

## 2013-09-29 NOTE — Progress Notes (Signed)
UR Completed Verneice Caspers Graves-Bigelow, RN,BSN [phone]  

## 2013-09-29 NOTE — Evaluation (Addendum)
Physical Therapy Evaluation/ Discharge Patient Details Name: HILLARIE HARRIGAN MRN: [id number] DOB: 05/19/1940 Today's Date: 09/29/2013   History of Present Illness  YARIAH SELVEY is a 73 y.o. female with prior h/o hypertension, hypothyroidism, came infor persistent dizziness and feeling faint 9/21. She reports an episode 2 weeks ago. She never passed out as per  The family. On arrival to ED, she underwent CT head without contrast which did not reveal any acute pathology. CXR does not reveal any pneumonia  Clinical Impression  Pt very pleasant and moving well. Pt without LOB during session despite balance testing. Pt with high level balance challenges unable to stand on one leg 10sec but no falls in last year and not significantly concerning for fall risk. Pt without sensation of spinning or dizziness throughout eval even with head turns supine and with gait. Pt at baseline functional level without current need for therapy. Will sign off with pt aware and agreeable.     Follow Up Recommendations No PT follow up    Equipment Recommendations  None recommended by PT    Recommendations for Other Services       Precautions / Restrictions Precautions Precautions: None      Mobility  Bed Mobility Overal bed mobility: Modified Independent                Transfers Overall transfer level: Modified independent                  Ambulation/Gait Ambulation/Gait assistance: Modified independent (Device/Increase time) Ambulation Distance (Feet): 400 Feet Assistive device: None Gait Pattern/deviations: WFL(Within Functional Limits)   Gait velocity interpretation: at or above normal speed for age/gender General Gait Details: pt able to complete head turns in all directions, change of speed and direction without LOB  Stairs            Wheelchair Mobility    Modified Rankin (Stroke Patients Only)       Balance Overall balance assessment: Modified Independent                                            Pertinent Vitals/Pain Pain Assessment: No/denies pain    Home Living Family/patient expects to be discharged to:: Private residence Living Arrangements: Alone Available Help at Discharge: Family;Available 24 hours/day Type of Home: House Home Access: Level entry     Home Layout: One level Home Equipment: None      Prior Function Level of Independence: Independent               Hand Dominance        Extremity/Trunk Assessment   Upper Extremity Assessment: Overall WFL for tasks assessed           Lower Extremity Assessment: Overall WFL for tasks assessed      Cervical / Trunk Assessment: Normal  Communication   Communication: No difficulties  Cognition Arousal/Alertness: Awake/alert Behavior During Therapy: WFL for tasks assessed/performed Overall Cognitive Status: Within Functional Limits for tasks assessed                      General Comments General comments (skin integrity, edema, etc.): Pt able to pick object off floor, turn 360degrees in less than 4 sec, single limb stance 5 sec and 10 sec RLE then LLE. Pt performed Romberg 40 sec and sharpened romberg 15 sec  Exercises        Assessment/Plan    PT Assessment Patent does not need any further PT services  PT Diagnosis     PT Problem List    PT Treatment Interventions     PT Goals (Current goals can be found in the Care Plan section) Acute Rehab PT Goals PT Goal Formulation: No goals set, d/c therapy    Frequency     Barriers to discharge        Co-evaluation               End of Session   Activity Tolerance: Patient tolerated treatment well Patient left: in bed;with call bell/phone within reach;with family/visitor present Nurse Communication: Mobility status    Functional Assessment Tool Used: clinical judgement Functional Limitation: Mobility: Walking and moving around Mobility: Walking and Moving Around  Current Status (T7001): At least 1 percent but less than 20 percent impaired, limited or restricted Mobility: Walking and Moving Around Goal Status [phone]): At least 1 percent but less than 20 percent impaired, limited or restricted Mobility: Walking and Moving Around Discharge Status [phone]): At least 1 percent but less than 20 percent impaired, limited or restricted    Time: 1057-1112 PT Time Calculation (min): 15 min   Charges:   PT Evaluation $Initial PT Evaluation Tier I: 1 Procedure     PT G Codes:   Functional Assessment Tool Used: clinical judgement Functional Limitation: Mobility: Walking and moving around    Melford Aase 09/29/2013, 11:38 AM  Elwyn Reach, [place]

## 2013-09-30 ENCOUNTER — Telehealth: Payer: Self-pay | Admitting: *Deleted

## 2013-09-30 NOTE — Telephone Encounter (Signed)
Transition Care Management Follow-up Telephone Call  How have you been since you were released from the hospital? Pt stated she a little weak but otherwise ok. Having been eating well she thinks that why she is weak  Do you understand why you were in the hospital? YES, she understood why she was admitted   Do you understand the discharge instrcutions? YES, she stated she understood her d/c instructions  Items Reviewed:  Medications reviewed: YES, she stated hospital md stated she was getting too much thyroid so he reduce her synthroid to .25 mcg  Allergies reviewed: {YES, updated  Dietary changes reviewed: YES heart heathy but she stated haven't been eating much       Referrals reviewed: No referral recommended  Functional Questionnaire:   Activities of Daily Living (ADLs):   She states she is independent in the following bathing, dressing herself,  She states she does not require assistance   Any transportation issues/concerns?: No   Any patient concerns? No    Confirmed importance and date/time of follow-up visits scheduled: Made appt to see pcp 10/13/13 advise pt to make sure she keep her hospital follow-up appt, but if she need [place] to call   Confirmed with patient if condition begins to worsen call PCP or go to the ER.  Patient was given the Call-a-Nurse line [phone]: YES provided call-a-nurse #

## 2013-10-01 NOTE — ED Provider Notes (Signed)
Medical screening examination/treatment/procedure(s) were conducted as a shared visit with non-physician practitioner(s) and myself.  I personally evaluated the patient during the encounter.   EKG Interpretation   Date/Time:  Monday September 28 2013 09:58:16 EDT Ventricular Rate:  83 PR Interval:  148 QRS Duration: 110 QT Interval:  410 QTC Calculation: 481 R Axis:   -47 Text Interpretation:  Normal sinus rhythm Left anterior fascicular block  Moderate voltage criteria for LVH, may be normal variant Abnormal ECG No  significant change since last tracing Confirmed by Falyn Rubel  MD, [place]  ([id number]) on 09/28/2013 10:21:49 AM       Patient with dizziness and near-syncope. Her story occasionally sounds like vertigo but also like a near syncopal event. She has some atypical neck pain that radiated to her chest. Given all this will admit for observation with telemetry monitoring. Workup initially is negative in the ED.  Ephraim Hamburger, MD 10/01/13 [phone]

## 2013-10-02 ENCOUNTER — Other Ambulatory Visit: Payer: Self-pay | Admitting: *Deleted

## 2013-10-02 MED ORDER — FUROSEMIDE 20 MG PO TABS: 20.0000 mg | ORAL_TABLET | Freq: Every day | ORAL | Status: DC

## 2013-10-12 ENCOUNTER — Other Ambulatory Visit (HOSPITAL_COMMUNITY)
Admission: RE | Admit: 2013-10-12 | Discharge: 2013-10-12 | Disposition: A | Discharge status: Home / Self Care | Payer: Medicare Other | Source: Ambulatory Visit | Attending: Gynecology | Admitting: Gynecology

## 2013-10-12 ENCOUNTER — Ambulatory Visit (INDEPENDENT_AMBULATORY_CARE_PROVIDER_SITE_OTHER): Payer: Medicare Other | Admitting: Gynecology

## 2013-10-12 ENCOUNTER — Encounter: Payer: Self-pay | Admitting: Gynecology

## 2013-10-12 VITALS — BP 124/78 | Ht 63.0 in | Wt 205.0 lb

## 2013-10-12 DIAGNOSIS — R87618 Other abnormal cytological findings on specimens from cervix uteri: Secondary | ICD-10-CM

## 2013-10-12 DIAGNOSIS — Z1151 Encounter for screening for human papillomavirus (HPV): Secondary | ICD-10-CM | POA: Diagnosis present

## 2013-10-12 DIAGNOSIS — Z01419 Encounter for gynecological examination (general) (routine) without abnormal findings: Secondary | ICD-10-CM | POA: Diagnosis present

## 2013-10-12 DIAGNOSIS — N952 Postmenopausal atrophic vaginitis: Secondary | ICD-10-CM

## 2013-10-12 DIAGNOSIS — C50912 Malignant neoplasm of unspecified site of left female breast: Secondary | ICD-10-CM

## 2013-10-12 DIAGNOSIS — N898 Other specified noninflammatory disorders of vagina: Secondary | ICD-10-CM

## 2013-10-12 NOTE — Progress Notes (Addendum)
Deanna Salazar 1940/10/03 [id number]        73 y.o.  G5P5 new patient with several issues noted below.  Past medical history,surgical history, problem list, medications, allergies, family history and social history were all reviewed and documented as reviewed in the EPIC chart.  ROS:  12 system ROS performed with pertinent positives and negatives included in the history, assessment and plan.   Additional significant findings :  none   Exam: Kim Counsellor Vitals:   10/12/13 1136  BP: 124/78  Height: 5\' 3"  (1.6 m)  Weight: 205 lb (92.987 kg)   General appearance:  Normal affect, orientation and appearance. Skin: Grossly normal HEENT: Without gross lesions.  No cervical or supraclavicular adenopathy. Thyroid normal.  Lungs:  Clear without wheezing, rales or rhonchi Cardiac: RR, without RMG Abdominal:  Soft, nontender, without masses, guarding, rebound, organomegaly or hernia. Midline lower scar noted Breasts:  Examined lying and sitting without masses, retractions, discharge or axillary adenopathy.  With well-healed left lumpectomy scar Pelvic:  Ext/BUS/vagina with generalized atrophic changes.  Cervix atrophic. Pap/HPV  Uterus retroverted, normal size, shape and contour, midline and mobile nontender   Adnexa  Without masses or tenderness    Anus and perineum  Normal   Rectovaginal  Normal sphincter tone without palpated masses or tenderness.    Assessment/Plan:  73 y.o. G5P5 new patient  1. History of "abnormal Pap smear" 2 years ago at [facility]. They apparently stopped taking her insurance and she presents here as a new patient. No history of previous abnormal Pap smears. Was told to repeat it in one year. Pap/HPV but was done today. Will obtain copy of her prior Pap smear and then triage based upon results. Patient knows to follow up to get them to send me a copy of the Pap smear. 2. Postmenopausal/atrophic genital changes. Patient overall without significant  symptoms apophysis, night sweats, vaginal dryness. Is not sexual active. No vaginal bleeding. Continue to follow and report any vaginal bleeding. 3. History of left breast cancer status post lumpectomy. Exam NED.  Mammography 12/2012. Repeat mammography this coming December. SBE monthly reviewed. Patient is in the process of scheduling an appointment with a new oncologist for routine follow up. 4. Vaginal irritation of the last several days. Thinks it is due to soap. Exam is grossly normal with atrophic changes. Recommend observation at present. If continues then recommend OTC antifungal. If persists despite this office visit follow up recommended. 5. DEXA many years ago. History of rheumatoid arthritis being treated with methotrexate. Recommend DEXA now the patient agrees to schedule. Increase calcium vitamin D reviewed. 6. Colonoscopy 2014. Repeat recommended interval. 7. Health maintenance. No routine blood work done as this is done at her primary physician's office. Follow up for bone density otherwise annually.   Addendum 10/13/2013: Pap smear results from other office 10/2011 showed ASCUS with negative high-risk HPV screen  Anastasio Auerbach MD, 12:11 PM 10/12/2013

## 2013-10-12 NOTE — Addendum Note (Signed)
Addended by: Nelva Nay on: 10/12/2013 12:20 PM   Modules accepted: Orders

## 2013-10-12 NOTE — Patient Instructions (Signed)
Use an over-the-counter vaginal yeast cream if irritation continues. Follow up for bone density as scheduled.  You may obtain a copy of any labs that were done today by logging onto MyChart as outlined in the instructions provided with your AVS (after visit summary). The office will not call with normal lab results but certainly if there are any significant abnormalities then we will contact you.   Health Maintenance, Female A healthy lifestyle and preventative care can promote health and wellness.  Maintain regular health, dental, and eye exams.  Eat a healthy diet. Foods like vegetables, fruits, whole grains, low-fat dairy products, and lean protein foods contain the nutrients you need without too many calories. Decrease your intake of foods high in solid fats, added sugars, and salt. Get information about a proper diet from your caregiver, if necessary.  Regular physical exercise is one of the most important things you can do for your health. Most adults should get at least 150 minutes of moderate-intensity exercise (any activity that increases your heart rate and causes you to sweat) each week. In addition, most adults need muscle-strengthening exercises on 2 or more days a week.   Maintain a healthy weight. The body mass index (BMI) is a screening tool to identify possible weight problems. It provides an estimate of body fat based on height and weight. Your caregiver can help determine your BMI, and can help you achieve or maintain a healthy weight. For adults 20 years and older:  A BMI below 18.5 is considered underweight.  A BMI of 18.5 to 24.9 is normal.  A BMI of 25 to 29.9 is considered overweight.  A BMI of 30 and above is considered obese.  Maintain normal blood lipids and cholesterol by exercising and minimizing your intake of saturated fat. Eat a balanced diet with plenty of fruits and vegetables. Blood tests for lipids and cholesterol should begin at age 21 and be repeated  every 5 years. If your lipid or cholesterol levels are high, you are over 50, or you are a high risk for heart disease, you may need your cholesterol levels checked more frequently.Ongoing high lipid and cholesterol levels should be treated with medicines if diet and exercise are not effective.  If you smoke, find out from your caregiver how to quit. If you do not use tobacco, do not start.  Lung cancer screening is recommended for adults aged 15 80 years who are at high risk for developing lung cancer because of a history of smoking. Yearly low-dose computed tomography (CT) is recommended for people who have at least a 30-pack-year history of smoking and are a current smoker or have quit within the past 15 years. A pack year of smoking is smoking an average of 1 pack of cigarettes a day for 1 year (for example: 1 pack a day for 30 years or 2 packs a day for 15 years). Yearly screening should continue until the smoker has stopped smoking for at least 15 years. Yearly screening should also be stopped for people who develop a health problem that would prevent them from having lung cancer treatment.  If you are pregnant, do not drink alcohol. If you are breastfeeding, be very cautious about drinking alcohol. If you are not pregnant and choose to drink alcohol, do not exceed 1 drink per day. One drink is considered to be 12 ounces (355 mL) of beer, 5 ounces (148 mL) of wine, or 1.5 ounces (44 mL) of liquor.  Avoid use of street drugs.  Do not share needles with anyone. Ask for help if you need support or instructions about stopping the use of drugs.  High blood pressure causes heart disease and increases the risk of stroke. Blood pressure should be checked at least every 1 to 2 years. Ongoing high blood pressure should be treated with medicines, if weight loss and exercise are not effective.  If you are 39 to 74 years old, ask your caregiver if you should take aspirin to prevent strokes.  Diabetes  screening involves taking a blood sample to check your fasting blood sugar level. This should be done once every 3 years, after age 49, if you are within normal weight and without risk factors for diabetes. Testing should be considered at a younger age or be carried out more frequently if you are overweight and have at least 1 risk factor for diabetes.  Breast cancer screening is essential preventative care for women. You should practice "breast self-awareness." This means understanding the normal appearance and feel of your breasts and may include breast self-examination. Any changes detected, no matter how small, should be reported to a caregiver. Women in their 50s and 30s should have a clinical breast exam (CBE) by a caregiver as part of a regular health exam every 1 to 3 years. After age 21, women should have a CBE every year. Starting at age 30, women should consider having a mammogram (breast X-ray) every year. Women who have a family history of breast cancer should talk to their caregiver about genetic screening. Women at a high risk of breast cancer should talk to their caregiver about having an MRI and a mammogram every year.  Breast cancer gene (BRCA)-related cancer risk assessment is recommended for women who have family members with BRCA-related cancers. BRCA-related cancers include breast, ovarian, tubal, and peritoneal cancers. Having family members with these cancers may be associated with an increased risk for harmful changes (mutations) in the breast cancer genes BRCA1 and BRCA2. Results of the assessment will determine the need for genetic counseling and BRCA1 and BRCA2 testing.  The Pap test is a screening test for cervical cancer. Women should have a Pap test starting at age 17. Between ages 59 and 53, Pap tests should be repeated every 2 years. Beginning at age 94, you should have a Pap test every 3 years as long as the past 3 Pap tests have been normal. If you had a hysterectomy for a  problem that was not cancer or a condition that could lead to cancer, then you no longer need Pap tests. If you are between ages 19 and 34, and you have had normal Pap tests going back 10 years, you no longer need Pap tests. If you have had past treatment for cervical cancer or a condition that could lead to cancer, you need Pap tests and screening for cancer for at least 20 years after your treatment. If Pap tests have been discontinued, risk factors (such as a new sexual partner) need to be reassessed to determine if screening should be resumed. Some women have medical problems that increase the chance of getting cervical cancer. In these cases, your caregiver may recommend more frequent screening and Pap tests.  The human papillomavirus (HPV) test is an additional test that may be used for cervical cancer screening. The HPV test looks for the virus that can cause the cell changes on the cervix. The cells collected during the Pap test can be tested for HPV. The HPV test could be used  to screen women aged 34 years and older, and should be used in women of any age who have unclear Pap test results. After the age of 60, women should have HPV testing at the same frequency as a Pap test.  Colorectal cancer can be detected and often prevented. Most routine colorectal cancer screening begins at the age of 26 and continues through age 63. However, your caregiver may recommend screening at an earlier age if you have risk factors for colon cancer. On a yearly basis, your caregiver may provide home test kits to check for hidden blood in the stool. Use of a small camera at the end of a tube, to directly examine the colon (sigmoidoscopy or colonoscopy), can detect the earliest forms of colorectal cancer. Talk to your caregiver about this at age 56, when routine screening begins. Direct examination of the colon should be repeated every 5 to 10 years through age 8, unless early forms of pre-cancerous polyps or small growths  are found.  Hepatitis C blood testing is recommended for all people born from [birth year] through 1966 and any individual with known risks for hepatitis C.  Practice safe sex. Use condoms and avoid high-risk sexual practices to reduce the spread of sexually transmitted infections (STIs). Sexually active women aged 95 and younger should be checked for Chlamydia, which is a common sexually transmitted infection. Older women with new or multiple partners should also be tested for Chlamydia. Testing for other STIs is recommended if you are sexually active and at increased risk.  Osteoporosis is a disease in which the bones lose minerals and strength with aging. This can result in serious bone fractures. The risk of osteoporosis can be identified using a bone density scan. Women ages 34 and over and women at risk for fractures or osteoporosis should discuss screening with their caregivers. Ask your caregiver whether you should be taking a calcium supplement or vitamin D to reduce the rate of osteoporosis.  Menopause can be associated with physical symptoms and risks. Hormone replacement therapy is available to decrease symptoms and risks. You should talk to your caregiver about whether hormone replacement therapy is right for you.  Use sunscreen. Apply sunscreen liberally and repeatedly throughout the day. You should seek shade when your shadow is shorter than you. Protect yourself by wearing long sleeves, pants, a wide-brimmed hat, and sunglasses year round, whenever you are outdoors.  Notify your caregiver of new moles or changes in moles, especially if there is a change in shape or color. Also notify your caregiver if a mole is larger than the size of a pencil eraser.  Stay current with your immunizations. Document Released: 07/10/2010 Document Revised: 04/21/2012 Document Reviewed: 07/10/2010 [facility] Patient Information 2014 [employer].

## 2013-10-13 ENCOUNTER — Ambulatory Visit (INDEPENDENT_AMBULATORY_CARE_PROVIDER_SITE_OTHER): Payer: Medicare Other | Admitting: Internal Medicine

## 2013-10-13 ENCOUNTER — Encounter: Payer: Self-pay | Admitting: Gynecology

## 2013-10-13 ENCOUNTER — Encounter: Payer: Self-pay | Admitting: Internal Medicine

## 2013-10-13 VITALS — BP 108/70 | HR 65 | Temp 98.2°F | Resp 18 | Ht 63.0 in | Wt 207.8 lb

## 2013-10-13 DIAGNOSIS — B353 Tinea pedis: Secondary | ICD-10-CM | POA: Insufficient documentation

## 2013-10-13 DIAGNOSIS — I10 Essential (primary) hypertension: Secondary | ICD-10-CM

## 2013-10-13 DIAGNOSIS — E039 Hypothyroidism, unspecified: Secondary | ICD-10-CM

## 2013-10-13 DIAGNOSIS — M069 Rheumatoid arthritis, unspecified: Secondary | ICD-10-CM

## 2013-10-13 DIAGNOSIS — E119 Type 2 diabetes mellitus without complications: Secondary | ICD-10-CM

## 2013-10-13 LAB — URINALYSIS W MICROSCOPIC + REFLEX CULTURE
Bacteria, UA: NONE SEEN
Bilirubin Urine: NEGATIVE
CRYSTALS: NONE SEEN
Casts: NONE SEEN
GLUCOSE, UA: NEGATIVE mg/dL
Hgb urine dipstick: NEGATIVE
KETONES UR: NEGATIVE mg/dL
Leukocytes, UA: NEGATIVE
Nitrite: NEGATIVE
Protein, ur: NEGATIVE mg/dL
SQUAMOUS EPITHELIAL / LPF: NONE SEEN
Specific Gravity, Urine: <1.005 — ABNORMAL LOW (ref 1.005–1.030)
Urobilinogen, UA: 0.2 mg/dL (ref 0.0–1.0)
pH: 7 (ref 5.0–8.0)

## 2013-10-13 MED ORDER — KETOCONAZOLE 2 % EX CREA: 1.0000 "application " | TOPICAL_CREAM | Freq: Every day | CUTANEOUS | Status: DC

## 2013-10-13 NOTE — Assessment & Plan Note (Signed)
Continue with current prescription therapy as reflected on the Med list.  

## 2013-10-13 NOTE — Assessment & Plan Note (Signed)
Ketocon Rx cream

## 2013-10-13 NOTE — Assessment & Plan Note (Signed)
TSH was low. Her Rx was reduced in 1/2 2 wks ago

## 2013-10-13 NOTE — Progress Notes (Signed)
Pre visit review using our clinic review tool, if applicable. No additional management support is needed unless otherwise documented below in the visit note. 

## 2013-10-13 NOTE — Progress Notes (Signed)
Subjective:    HPI F/u Hospital adm (d/c on 09/29/13):   Near syncope  -Presented with dizziness and feeling faint, denies LOC or fall- symptoms resolved and didn't recur.  -MRI- negative for stroke. 2D echo 65-70%, increased in wall thickness consistent with LVH, grade 1 diastolic dysfunction. EKG- sinus rhythm with LVH. Negative cardiac markers and no evidence of orthostatic hypotension.  -On cardiac monitoring, there was a question about an arrhythmia (Torsades). However, cardiologist consulted doesn't think this is an arrhythmia but rather in an artifact.  -Discharged home per PT recommendation  Hypertension  -BPs stable  -continue home medication of amlodipine, Carvedilol, and Losartan.  -Follow up with PCP for HTN management  Hyperthyroidism  -TSH low at 0.142  -Decreased home regimen of levothyroxine to 25 mcg BID  -Follow up TSH recheck in 4-6 weeks  Sinusitis  -complains of sinus pain and headache for past two weeks, associated with congestion and mucoid green rhinorrhea  -started on Augmentin 500 PO BID for 7 days.  Rheumatoid Arthritis  -continue methotrexate 10 mg PO  GERD  Continue regimen of pantoprazole  Procedures:  2D echocardiogram- EF 65%; Wall thickness was increased in a pattern of mild LVH. Systolic function was vigorous. There was no dynamic obstruction, there were no regional wall motion abnormalities. Doppler parameters are consistent with abnormal left ventricular relaxation (grade 1 diastolic dysfunction)  Consultations:  Cardiology  F/u FMS - using Aleve prn, IBS - not worse  F/u B ankles swelling at times F/u HTN, RA and elev CBG. C/o fatigue  Wt Readings from Last 3 Encounters:  10/13/13 207 lb 12.8 oz (94.257 kg)  10/12/13 205 lb (92.987 kg)  09/29/13 199 lb (90.266 kg)   BP Readings from Last 3 Encounters:  10/13/13 108/70  10/12/13 124/78  09/29/13 140/72      Review of Systems  Constitutional: Positive for fatigue. Negative for  diaphoresis, activity change, appetite change and unexpected weight change.  HENT: Negative for congestion, facial swelling, hearing loss, mouth sores, nosebleeds, sinus pressure, sneezing, tinnitus and trouble swallowing.   Eyes: Negative for pain, discharge, redness, itching and visual disturbance.  Respiratory: Negative for cough, chest tightness and stridor.   Cardiovascular: Negative for palpitations and leg swelling.  Gastrointestinal: Positive for constipation and abdominal distention. Negative for nausea, vomiting, diarrhea, blood in stool, anal bleeding and rectal pain.  Genitourinary: Negative for urgency, frequency, hematuria, flank pain, vaginal bleeding, vaginal discharge, difficulty urinating and genital sores.  Musculoskeletal: Positive for arthralgias. Negative for gait problem, joint swelling, neck pain and neck stiffness.  Skin: Negative.   Neurological: Negative for dizziness, tremors, seizures, syncope and speech difficulty.  Hematological: Negative for adenopathy. Does not bruise/bleed easily.  Psychiatric/Behavioral: Negative for suicidal ideas, behavioral problems, sleep disturbance, dysphoric mood and decreased concentration. The patient is not nervous/anxious.        Objective:   Physical Exam  Constitutional: She appears well-developed. No distress.  obese  HENT:  Head: Normocephalic.  Right Ear: External ear normal.  Left Ear: External ear normal.  Nose: Nose normal.  Mouth/Throat: Oropharynx is clear and moist.  Eyes: Conjunctivae are normal. Pupils are equal, round, and reactive to light. Right eye exhibits no discharge. Left eye exhibits no discharge.  Neck: Normal range of motion. Neck supple. No JVD present. No tracheal deviation present. No thyromegaly present.  Cardiovascular: Normal rate, regular rhythm and normal heart sounds.   Pulmonary/Chest: No stridor. No respiratory distress. She has no wheezes.  Abdominal: Soft. Bowel sounds  are normal. She  exhibits no distension and no mass. There is no tenderness. There is no rebound and no guarding.  Musculoskeletal: She exhibits no edema and no tenderness.  Lymphadenopathy:    She has no cervical adenopathy.  Neurological: She displays normal reflexes. No cranial nerve deficit. She exhibits normal muscle tone. Coordination normal.  Skin: No rash noted. No erythema.  Psychiatric: Her behavior is normal. Judgment and thought content normal.  sad    Lab Results  Component Value Date   WBC 5.8 09/28/2013   HGB 11.7* 09/28/2013   HCT 36.2 09/28/2013   PLT 255 09/28/2013   GLUCOSE 122* 09/28/2013   CHOL 198 07/14/2013   TRIG 82.0 07/14/2013   HDL 46.50 07/14/2013   LDLDIRECT 180.9 07/26/2009   LDLCALC 135* 07/14/2013   ALT 16 06/24/2012   AST 22 06/24/2012   NA 143 09/28/2013   K 3.8 09/28/2013   CL 105 09/28/2013   CREATININE 0.87 09/28/2013   BUN 14 09/28/2013   CO2 26 09/28/2013   TSH 0.142* 09/28/2013   HGBA1C 6.4* 09/28/2013   8/14 LS MRI: IMPRESSION:   1. Advanced facet disease at L5-S1 with a resulting  anterolisthesis and diffuse disc bulging. 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  2. Slightly lesser facet disease and anterolisthesis at L4-L5 with  mild resulting central, lateral recess and foraminal stenosis  bilaterally.  3. No other significant disc space findings.  Original Report Authenticated By: Richardean Sale, M.D.      Assessment & Plan:

## 2013-10-13 NOTE — Assessment & Plan Note (Signed)
On MTX

## 2013-10-13 NOTE — Assessment & Plan Note (Signed)
Labs

## 2013-10-13 NOTE — Patient Instructions (Signed)
Fat and Cholesterol Control Diet Fat and cholesterol levels in your blood and organs are influenced by your diet. High levels of fat and cholesterol may lead to diseases of the heart, small and large blood vessels, gallbladder, liver, and pancreas. CONTROLLING FAT AND CHOLESTEROL WITH DIET Although exercise and lifestyle factors are important, your diet is key. That is because certain foods are known to raise cholesterol and others to lower it. The goal is to balance foods for their effect on cholesterol and more importantly, to replace saturated and trans fat with other types of fat, such as monounsaturated fat, polyunsaturated fat, and omega-3 fatty acids. On average, a person should consume no more than 15 to 17 g of saturated fat daily. Saturated and trans fats are considered "bad" fats, and they will raise LDL cholesterol. Saturated fats are primarily found in animal products such as meats, butter, and cream. However, that does not mean you need to give up all your favorite foods. Today, there are good tasting, low-fat, low-cholesterol substitutes for most of the things you like to eat. Choose low-fat or nonfat alternatives. Choose round or loin cuts of red meat. These types of cuts are lowest in fat and cholesterol. Chicken (without the skin), fish, veal, and ground turkey breast are great choices. Eliminate fatty meats, such as hot dogs and salami. Even shellfish have little or no saturated fat. Have a 3 oz (85 g) portion when you eat lean meat, poultry, or fish. Trans fats are also called "partially hydrogenated oils." They are oils that have been scientifically manipulated so that they are solid at room temperature resulting in a longer shelf life and improved taste and texture of foods in which they are added. Trans fats are found in stick margarine, some tub margarines, cookies, crackers, and baked goods.  When baking and cooking, oils are a great substitute for butter. The monounsaturated oils are  especially beneficial since it is believed they lower LDL and raise HDL. The oils you should avoid entirely are saturated tropical oils, such as coconut and palm.  Remember to eat a lot from food groups that are naturally free of saturated and trans fat, including fish, fruit, vegetables, beans, grains (barley, rice, couscous, bulgur wheat), and pasta (without cream sauces).  IDENTIFYING FOODS THAT LOWER FAT AND CHOLESTEROL  Soluble fiber may lower your cholesterol. This type of fiber is found in fruits such as apples, vegetables such as broccoli, potatoes, and carrots, legumes such as beans, peas, and lentils, and grains such as barley. Foods fortified with plant sterols (phytosterol) may also lower cholesterol. You should eat at least 2 g per day of these foods for a cholesterol lowering effect.  Read package labels to identify low-saturated fats, trans fat free, and low-fat foods at the supermarket. Select cheeses that have only 2 to 3 g saturated fat per ounce. Use a heart-healthy tub margarine that is free of trans fats or partially hydrogenated oil. When buying baked goods (cookies, crackers), avoid partially hydrogenated oils. Breads and muffins should be made from whole grains (whole-wheat or whole oat flour, instead of "flour" or "enriched flour"). Buy non-creamy canned soups with reduced salt and no added fats.  FOOD PREPARATION TECHNIQUES  Never deep-fry. If you must fry, either stir-fry, which uses very little fat, or use non-stick cooking sprays. When possible, broil, bake, or roast meats, and steam vegetables. Instead of putting butter or margarine on vegetables, use lemon and herbs, applesauce, and cinnamon (for squash and sweet potatoes). Use nonfat   yogurt, salsa, and low-fat dressings for salads.  LOW-SATURATED FAT / LOW-FAT FOOD SUBSTITUTES Meats / Saturated Fat (g)  Avoid: Steak, marbled (3 oz/85 g) / 11 g  Choose: Steak, lean (3 oz/85 g) / 4 g  Avoid: Hamburger (3 oz/85 g) / 7  g  Choose: Hamburger, lean (3 oz/85 g) / 5 g  Avoid: Ham (3 oz/85 g) / 6 g  Choose: Ham, lean cut (3 oz/85 g) / 2.4 g  Avoid: Chicken, with skin, dark meat (3 oz/85 g) / 4 g  Choose: Chicken, skin removed, dark meat (3 oz/85 g) / 2 g  Avoid: Chicken, with skin, light meat (3 oz/85 g) / 2.5 g  Choose: Chicken, skin removed, light meat (3 oz/85 g) / 1 g Dairy / Saturated Fat (g)  Avoid: Whole milk (1 cup) / 5 g  Choose: Low-fat milk, 2% (1 cup) / 3 g  Choose: Low-fat milk, 1% (1 cup) / 1.5 g  Choose: Skim milk (1 cup) / 0.3 g  Avoid: Hard cheese (1 oz/28 g) / 6 g  Choose: Skim milk cheese (1 oz/28 g) / 2 to 3 g  Avoid: Cottage cheese, 4% fat (1 cup) / 6.5 g  Choose: Low-fat cottage cheese, 1% fat (1 cup) / 1.5 g  Avoid: Ice cream (1 cup) / 9 g  Choose: Sherbet (1 cup) / 2.5 g  Choose: Nonfat frozen yogurt (1 cup) / 0.3 g  Choose: Frozen fruit bar / trace  Avoid: Whipped cream (1 tbs) / 3.5 g  Choose: Nondairy whipped topping (1 tbs) / 1 g Condiments / Saturated Fat (g)  Avoid: Mayonnaise (1 tbs) / 2 g  Choose: Low-fat mayonnaise (1 tbs) / 1 g  Avoid: Butter (1 tbs) / 7 g  Choose: Extra light margarine (1 tbs) / 1 g  Avoid: Coconut oil (1 tbs) / 11.8 g  Choose: Olive oil (1 tbs) / 1.8 g  Choose: Corn oil (1 tbs) / 1.7 g  Choose: Safflower oil (1 tbs) / 1.2 g  Choose: Sunflower oil (1 tbs) / 1.4 g  Choose: Soybean oil (1 tbs) / 2.4 g  Choose: Canola oil (1 tbs) / 1 g Document Released: 12/25/2004 Document Revised: 04/21/2012 Document Reviewed: 03/25/2013 [employer] Patient Information 2015 [employer]. This information is not intended to replace advice given to you by your health care provider. Make sure you discuss any questions you have with your health care provider.  

## 2013-10-14 LAB — CYTOLOGY - PAP

## 2013-10-18 ENCOUNTER — Encounter: Payer: Self-pay | Admitting: Internal Medicine

## 2013-11-04 ENCOUNTER — Ambulatory Visit: Payer: Medicare Other | Admitting: Gastroenterology

## 2013-11-06 ENCOUNTER — Encounter: Payer: Self-pay | Admitting: *Deleted

## 2013-11-09 ENCOUNTER — Encounter: Payer: Self-pay | Admitting: *Deleted

## 2013-11-10 ENCOUNTER — Other Ambulatory Visit: Payer: Self-pay | Admitting: Gynecology

## 2013-11-10 ENCOUNTER — Ambulatory Visit (INDEPENDENT_AMBULATORY_CARE_PROVIDER_SITE_OTHER): Payer: Medicare Other

## 2013-11-10 DIAGNOSIS — Z780 Asymptomatic menopausal state: Secondary | ICD-10-CM

## 2013-11-10 DIAGNOSIS — N952 Postmenopausal atrophic vaginitis: Secondary | ICD-10-CM

## 2013-11-12 ENCOUNTER — Ambulatory Visit (INDEPENDENT_AMBULATORY_CARE_PROVIDER_SITE_OTHER): Payer: Medicare Other | Admitting: Gastroenterology

## 2013-11-12 ENCOUNTER — Encounter: Payer: Self-pay | Admitting: Gastroenterology

## 2013-11-12 VITALS — BP 140/86 | HR 68 | Ht 62.75 in | Wt 206.4 lb

## 2013-11-12 DIAGNOSIS — K219 Gastro-esophageal reflux disease without esophagitis: Secondary | ICD-10-CM

## 2013-11-12 DIAGNOSIS — Z86010 Personal history of colonic polyps: Secondary | ICD-10-CM

## 2013-11-12 DIAGNOSIS — K589 Irritable bowel syndrome without diarrhea: Secondary | ICD-10-CM

## 2013-11-12 DIAGNOSIS — K5901 Slow transit constipation: Secondary | ICD-10-CM

## 2013-11-12 MED ORDER — OMEPRAZOLE 40 MG PO CPDR: 40.0000 mg | DELAYED_RELEASE_CAPSULE | Freq: Two times a day (BID) | ORAL | Status: DC

## 2013-11-12 NOTE — Patient Instructions (Addendum)
TAKE YOUR MIRALAX EVERYDAY between 1 and 3 times each day to achieve adequate bowel movement. TAKE YOUR OMEPRAZOLE 40 MG TWICE DAILY EVERYDAY. A refill has been sent to your mail order pharmacy.  CC: Lew Dawes MD

## 2013-11-12 NOTE — Progress Notes (Signed)
    History of Present Illness: This is a 73 year old female with IBS, chronic constipation, personal history of adenomatous colon polyps and GERD. She underwent surveillance colonoscopy in June 2014 with small tubular adenomatous polyps removed. She has tried multiple laxative regimens over the past several years for treating constipation including MiraLAX 1-3 times daily and Linzess. She feels these have not been effective. On further questioning she continues to take MiraLAX as needed and not on a regularly scheduled daily basis as we have recommended on multiple occasions. Her reflux symptoms have flared and she is no longer taking Prilosec. We have previously recommended she remain on Prilosec twice daily long-term, not as needed.  Current Medications, Allergies, Past Medical History, Past Surgical History, Family History and Social History were reviewed in [employer] record.  Physical Exam: General: Well developed , well nourished, no acute distress Head: Normocephalic and atraumatic Eyes:  sclerae anicteric, EOMI Ears: Normal auditory acuity Mouth: No deformity or lesions Lungs: Clear throughout to auscultation Heart: Regular rate and rhythm; no murmurs, rubs or bruits Abdomen: Soft, non tender and non distended. No masses, hepatosplenomegaly or hernias noted. Normal Bowel sounds Musculoskeletal: Symmetrical with no gross deformities  Pulses:  Normal pulses noted Extremities: No clubbing, cyanosis, edema or deformities noted Neurological: Alert oriented x 4, grossly nonfocal Psychological:  Alert and cooperative. Normal mood and affect  Assessment and Recommendations:  1. Chronic IBS-constipation. Noncompliance. MiraLax  needed on a regular schedule 1-3 times every day and not as needed. I have advised this several times previously. I have again tried to clearly emphasized that she needs to remain on a dose of MiraLAX every day between 1 and 3 times daily and  that she adjusts the number of doses for adequate bowel movements. At the minimum she is to take 1 dose of MiraLAX every day. Consider Amitiza if above not effective.  2. GERD. Noncompliance. Resume Prilosec to 40 mg twice daily. She states she would like to temporarily discontinue this medication when she doesn't have symptoms however I reminded her that when she has been on Prilosec qd or off Prilosec her reflux symptoms typically flare.  3. History of adenomatous colon polyps. Surveillance colonoscopy due June 2019.   4. Fatty liver. Long term low fat diet and weight loss.

## 2013-11-13 ENCOUNTER — Telehealth: Payer: Self-pay | Admitting: Gastroenterology

## 2013-11-13 MED ORDER — OMEPRAZOLE 40 MG PO CPDR: 40.0000 mg | DELAYED_RELEASE_CAPSULE | Freq: Two times a day (BID) | ORAL | Status: DC

## 2013-11-13 NOTE — Telephone Encounter (Signed)
Rx sent. Left message advising pt.

## 2013-11-18 ENCOUNTER — Telehealth: Payer: Self-pay | Admitting: Gastroenterology

## 2013-11-18 NOTE — Telephone Encounter (Signed)
Noted  

## 2013-11-23 ENCOUNTER — Telehealth: Payer: Self-pay | Admitting: Internal Medicine

## 2013-11-23 NOTE — Telephone Encounter (Signed)
Patient Information:  Caller Name: Huston Foley  Phone: [phone]  Patient: Deanna Salazar, Deanna Salazar  Gender: Female  DOB: 1940-07-24  Age: 73 Years  PCP: Plotnikov, Alex (Adults only)  Office Follow Up:  Does the office need to follow up with this patient?: Yes  Instructions For The Office: Calling about dry cough x 2 months. Has tried warm lemon w/ honey and cough drops with fair effect. Is requesting that cough med be called in. Should pt be seen first? No appts available at office today or tomorrow. PLEASE ADVISE.  RN Note:  No appts available at the office today or tomorrow. Message will be sent to the office to follow-up with pt. Home care advice given.  Symptoms  Reason For Call & Symptoms: Calling about dry cough x 2 months but no other sxs. Is requesting cough med be called in.  Reviewed Health History In EMR: Yes  Reviewed Medications In EMR: Yes  Reviewed Allergies In EMR: Yes  Reviewed Surgeries / Procedures: Yes  Date of Onset of Symptoms: 09/21/2013  Treatments Tried: warm lemon and honey,cough drops  Treatments Tried Worked: Yes  Guideline(s) Used:  Cough  Disposition Per Guideline:   See Within 3 Days in Office  Reason For Disposition Reached:   Cough has been present for > 10 days  Advice Given:  Cough Medicines:  OTC Cough Syrups: The most common cough suppressant in OTC cough medications is dextromethorphan. Often the letters "DM" appear in the name.  OTC Cough Drops: Cough drops can help a lot, especially for mild coughs. They reduce coughing by soothing your irritated throat and removing that tickle sensation in the back of the throat. Cough drops also have the advantage of portability - you can carry them with you.  Home Remedy - Honey: This old home remedy has been shown to help decrease coughing at night. The adult dosage is 2 teaspoons (10 ml) at bedtime. Honey should not be given to infants under one year of age.  OTC Cough Syrup - Dextromethorphan:  Cough  syrups containing the cough suppressant dextromethorphan (DM) may help decrease your cough. Cough syrups work best for coughs that keep you awake at night. They can also sometimes help in the late stages of a respiratory infection when the cough is dry and hacking. They can be used along with cough drops.  Examples: Benylin, Robitussin DM, Vicks 44 Cough Relief  Coughing Spasms:  Drink warm fluids. Inhale warm mist (Reason: both relax the airway and loosen up the phlegm).  Suck on cough drops or hard candy to coat the irritated throat.  Prevent Dehydration:  Drink adequate liquids.  Call Back If:  Difficulty breathing  Cough lasts more than 3 weeks  Fever lasts > 3 days  You become worse.  Patient Will Follow Care Advice:  YES

## 2013-12-15 ENCOUNTER — Other Ambulatory Visit: Payer: Self-pay | Admitting: Oncology

## 2013-12-15 ENCOUNTER — Other Ambulatory Visit: Payer: Self-pay | Admitting: Internal Medicine

## 2013-12-15 DIAGNOSIS — N644 Mastodynia: Secondary | ICD-10-CM

## 2013-12-15 DIAGNOSIS — Z853 Personal history of malignant neoplasm of breast: Secondary | ICD-10-CM

## 2013-12-17 ENCOUNTER — Ambulatory Visit: Payer: Medicare Other | Admitting: Internal Medicine

## 2014-01-04 ENCOUNTER — Ambulatory Visit (INDEPENDENT_AMBULATORY_CARE_PROVIDER_SITE_OTHER): Payer: Medicare Other | Admitting: Internal Medicine

## 2014-01-04 ENCOUNTER — Encounter: Payer: Self-pay | Admitting: Internal Medicine

## 2014-01-04 ENCOUNTER — Other Ambulatory Visit (INDEPENDENT_AMBULATORY_CARE_PROVIDER_SITE_OTHER): Payer: Medicare Other

## 2014-01-04 VITALS — BP 140/80 | HR 65 | Temp 99.1°F | Wt 209.0 lb

## 2014-01-04 DIAGNOSIS — E039 Hypothyroidism, unspecified: Secondary | ICD-10-CM

## 2014-01-04 DIAGNOSIS — Z23 Encounter for immunization: Secondary | ICD-10-CM

## 2014-01-04 DIAGNOSIS — E119 Type 2 diabetes mellitus without complications: Secondary | ICD-10-CM

## 2014-01-04 DIAGNOSIS — I10 Essential (primary) hypertension: Secondary | ICD-10-CM

## 2014-01-04 DIAGNOSIS — K2100 Gastro-esophageal reflux disease with esophagitis, without bleeding: Secondary | ICD-10-CM

## 2014-01-04 DIAGNOSIS — E785 Hyperlipidemia, unspecified: Secondary | ICD-10-CM

## 2014-01-04 DIAGNOSIS — B353 Tinea pedis: Secondary | ICD-10-CM

## 2014-01-04 LAB — BASIC METABOLIC PANEL
BUN: 12 mg/dL (ref 6–23)
CALCIUM: 9.3 mg/dL (ref 8.4–10.5)
CO2: 28 mEq/L (ref 19–32)
Chloride: 105 mEq/L (ref 96–112)
Creatinine, Ser: 0.9 mg/dL (ref 0.4–1.2)
GFR: 82.87 mL/min (ref >60.00)
GLUCOSE: 94 mg/dL (ref 70–99)
POTASSIUM: 3.9 meq/L (ref 3.5–5.1)
Sodium: 143 mEq/L (ref 135–145)

## 2014-01-04 LAB — TSH: TSH: 1.74 u[IU]/mL (ref 0.35–4.50)

## 2014-01-04 LAB — HEMOGLOBIN A1C: HEMOGLOBIN A1C: 6.5 % (ref 4.6–6.5)

## 2014-01-04 MED ORDER — PANTOPRAZOLE SODIUM 40 MG PO TBEC: 40.0000 mg | DELAYED_RELEASE_TABLET | Freq: Every day | ORAL | Status: DC

## 2014-01-04 NOTE — Progress Notes (Signed)
   Subjective:    HPI    F/u FMS - using Aleve prn, IBS - not worse  F/u B ankles swelling at times  F/u HTN, RA and elev CBG. C/o fatigue  Wt Readings from Last 3 Encounters:  01/04/14 209 lb (94.802 kg)  11/12/13 206 lb 6 oz (93.611 kg)  10/13/13 207 lb 12.8 oz (94.257 kg)   BP Readings from Last 3 Encounters:  01/04/14 140/80  11/12/13 140/86  10/13/13 108/70      Review of Systems  Constitutional: Positive for fatigue. Negative for diaphoresis, activity change, appetite change and unexpected weight change.  HENT: Negative for congestion, facial swelling, hearing loss, mouth sores, nosebleeds, sinus pressure, sneezing, tinnitus and trouble swallowing.   Eyes: Negative for pain, discharge, redness, itching and visual disturbance.  Respiratory: Negative for cough, chest tightness and stridor.   Cardiovascular: Negative for palpitations and leg swelling.  Gastrointestinal: Positive for constipation and abdominal distention. Negative for nausea, vomiting, diarrhea, blood in stool, anal bleeding and rectal pain.  Genitourinary: Negative for urgency, frequency, hematuria, flank pain, vaginal bleeding, vaginal discharge, difficulty urinating and genital sores.  Musculoskeletal: Positive for arthralgias. Negative for joint swelling, gait problem, neck pain and neck stiffness.  Skin: Negative.   Neurological: Negative for dizziness, tremors, seizures, syncope and speech difficulty.  Hematological: Negative for adenopathy. Does not bruise/bleed easily.  Psychiatric/Behavioral: Negative for suicidal ideas, behavioral problems, sleep disturbance, dysphoric mood and decreased concentration. The patient is not nervous/anxious.        Objective:   Physical Exam  Lab Results  Component Value Date   WBC 5.8 09/28/2013   HGB 11.7* 09/28/2013   HCT 36.2 09/28/2013   PLT 255 09/28/2013   GLUCOSE 122* 09/28/2013   CHOL 198 07/14/2013   TRIG 82.0 07/14/2013   HDL 46.50 07/14/2013    LDLDIRECT 180.9 07/26/2009   LDLCALC 135* 07/14/2013   ALT 16 06/24/2012   AST 22 06/24/2012   NA 143 09/28/2013   K 3.8 09/28/2013   CL 105 09/28/2013   CREATININE 0.87 09/28/2013   BUN 14 09/28/2013   CO2 26 09/28/2013   TSH 0.142* 09/28/2013   HGBA1C 6.4* 09/28/2013   8/14 LS MRI: IMPRESSION:   1. Advanced facet disease at L5-S1 with a resulting  anterolisthesis and diffuse disc bulging. 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  2. Slightly lesser facet disease and anterolisthesis at L4-L5 with  mild resulting central, lateral recess and foraminal stenosis  bilaterally.  3. No other significant disc space findings.  Original Report Authenticated By: Richardean Sale, M.D.      Assessment & Plan:  Patient ID: Deanna Salazar, female   DOB: [birth year], 73 y.o.   MRN: [id number]

## 2014-01-04 NOTE — Progress Notes (Signed)
Pre visit review using our clinic review tool, if applicable. No additional management support is needed unless otherwise documented below in the visit note. 

## 2014-01-04 NOTE — Assessment & Plan Note (Signed)
Will d/c omeprazole due to interaction w/MTX Start Protonix

## 2014-01-04 NOTE — Assessment & Plan Note (Signed)
Continue with current prescription therapy as reflected on the Med list.  

## 2014-01-04 NOTE — Assessment & Plan Note (Signed)
Labs TSH 

## 2014-01-04 NOTE — Assessment & Plan Note (Signed)
Derm ref offered

## 2014-01-04 NOTE — Assessment & Plan Note (Signed)
BP Readings from Last 3 Encounters:  01/04/14 140/80  11/12/13 140/86  10/13/13 108/70

## 2014-01-06 ENCOUNTER — Telehealth: Payer: Self-pay | Admitting: Gastroenterology

## 2014-01-06 NOTE — Telephone Encounter (Signed)
Patient states she just wanted to let us know Dr. Alain Marion switched her to Protonix in place of the omeprazole. Informed patient that we can see the same records and omeprazole has been taken off her list. Pt verbalized understanding.

## 2014-01-11 ENCOUNTER — Other Ambulatory Visit: Payer: Self-pay | Admitting: Internal Medicine

## 2014-01-11 ENCOUNTER — Ambulatory Visit
Admission: RE | Admit: 2014-01-11 | Discharge: 2014-01-11 | Disposition: A | Discharge status: Home / Self Care | Payer: Self-pay | Source: Ambulatory Visit | Attending: Internal Medicine | Admitting: Internal Medicine

## 2014-01-11 ENCOUNTER — Ambulatory Visit
Admission: RE | Admit: 2014-01-11 | Discharge: 2014-01-11 | Disposition: A | Discharge status: Home / Self Care | Payer: Medicare Other | Source: Ambulatory Visit | Attending: Internal Medicine | Admitting: Internal Medicine

## 2014-01-11 ENCOUNTER — Encounter (INDEPENDENT_AMBULATORY_CARE_PROVIDER_SITE_OTHER): Payer: Self-pay

## 2014-01-11 DIAGNOSIS — N644 Mastodynia: Secondary | ICD-10-CM

## 2014-01-11 DIAGNOSIS — Z853 Personal history of malignant neoplasm of breast: Secondary | ICD-10-CM

## 2014-01-13 ENCOUNTER — Telehealth: Payer: Self-pay | Admitting: Internal Medicine

## 2014-01-13 MED ORDER — PANTOPRAZOLE SODIUM 40 MG PO TBEC: 40.0000 mg | DELAYED_RELEASE_TABLET | Freq: Every day | ORAL | Status: DC

## 2014-01-13 NOTE — Telephone Encounter (Signed)
Per record md hit print pt stated she did not received rx. Inform pt sent electronically to [facility].../lmb

## 2014-01-13 NOTE — Telephone Encounter (Signed)
Pt called in said that the meds that was called in on 01/04/2014 [facility] says they dont have it.    pantoprazole (PROTONIX) 40 MG tablet [[id number]

## 2014-02-17 ENCOUNTER — Telehealth: Payer: Self-pay | Admitting: Gastroenterology

## 2014-02-17 NOTE — Telephone Encounter (Signed)
Patient reports that she is having bloating and swelling in her stomach and "I am gaining weight in my upper stomach".  She will come in and see Dr. Fuller Plan on Friday 02/19/14

## 2014-02-19 ENCOUNTER — Encounter: Payer: Self-pay | Admitting: Gastroenterology

## 2014-02-19 ENCOUNTER — Ambulatory Visit (INDEPENDENT_AMBULATORY_CARE_PROVIDER_SITE_OTHER): Payer: Medicare Other | Admitting: Gastroenterology

## 2014-02-19 VITALS — BP 136/72 | HR 64 | Ht 62.75 in | Wt 209.2 lb

## 2014-02-19 DIAGNOSIS — K589 Irritable bowel syndrome without diarrhea: Secondary | ICD-10-CM

## 2014-02-19 DIAGNOSIS — R1013 Epigastric pain: Secondary | ICD-10-CM

## 2014-02-19 DIAGNOSIS — K219 Gastro-esophageal reflux disease without esophagitis: Secondary | ICD-10-CM

## 2014-02-19 MED ORDER — PANTOPRAZOLE SODIUM 40 MG PO TBEC: 40.0000 mg | DELAYED_RELEASE_TABLET | Freq: Two times a day (BID) | ORAL | Status: DC

## 2014-02-19 NOTE — Patient Instructions (Signed)
Increase your Protonix to 40 mg one tablet by mouth twice daily. A new prescription has been sent to your pharmacy.   You have been scheduled for a Barium Esophogram at [facility] (1st floor of the hospital) on 02/26/14 at 11:00am. Please arrive 15 minutes prior to your appointment for registration. Make certain not to have anything to eat or drink 6 hours prior to your test. If you need to reschedule for any reason, please contact radiology at [phone] to do so. __________________________________________________________________ A barium swallow is an examination that concentrates on views of the esophagus. This tends to be a double contrast exam (barium and two liquids which, when combined, create a gas to distend the wall of the oesophagus) or single contrast (non-ionic iodine based). The study is usually tailored to your symptoms so a good history is essential. Attention is paid during the study to the form, structure and configuration of the esophagus, looking for functional disorders (such as aspiration, dysphagia, achalasia, motility and reflux) EXAMINATION You may be asked to change into a gown, depending on the type of swallow being performed. A radiologist and radiographer will perform the procedure. The radiologist will advise you of the type of contrast selected for your procedure and direct you during the exam. You will be asked to stand, sit or lie in several different positions and to hold a small amount of fluid in your mouth before being asked to swallow while the imaging is performed .In some instances you may be asked to swallow barium coated marshmallows to assess the motility of a solid food bolus. The exam can be recorded as a digital or video fluoroscopy procedure. POST PROCEDURE It will take 1-2 days for the barium to pass through your system. To facilitate this, it is important, unless otherwise directed, to increase your fluids for the next 24-48hrs and to resume your  normal diet.  This test typically takes about 30 minutes to perform. __________________________________________________________________________________  Thank you for choosing me and [facility].  Pricilla Riffle. Dagoberto Ligas., MD., Marval Regal

## 2014-02-19 NOTE — Progress Notes (Signed)
History of Present Illness: This is a 74 year old female complaining of a pain/lump in her epigastrium. She also notes some discomfort in her lower sternal area during and after swallowing food. She was here in 11/2013 and has not been compliant with recommended treatment for GERD. She felt her new omeprazole 40 mg capsules were too large and they may have been bothering her stomach so she discontinued taking omeprazole bid as prescribed. She was started on pantoprazole 40 mg daily by Dr. Alain Marion but she has only been taking this intermittently because she fears this medication is causing problems.  Allergies  Allergen Reactions  . Codeine     REACTION: nausea after a week or so: can take it short term  . Hydrocodone     REACTION: nausea  . Lovastatin Itching    cramps  . Tramadol Hcl     REACTION: itching   Outpatient Prescriptions Prior to Visit  Medication Sig Dispense Refill  . acetaminophen (TYLENOL) 325 MG tablet Take 650 mg by mouth as needed for headache.     Marland Kitchen amLODipine (NORVASC) 5 MG tablet Take 1 tablet (5 mg total) by mouth daily. 90 tablet 3  . carvedilol (COREG) 12.5 MG tablet Take 1 tablet (12.5 mg total) by mouth 2 (two) times daily with a meal. 180 tablet 3  . Cholecalciferol (VITAMIN D3) 1000 UNITS CAPS Take 1,000 Units by mouth daily.     . folic acid (FOLVITE) 1 MG tablet Take 3 mg by mouth daily.     . furosemide (LASIX) 20 MG tablet Take 1 tablet (20 mg total) by mouth daily. 90 tablet 3  . levothyroxine (SYNTHROID, LEVOTHROID) 50 MCG tablet Take 0.5 tablets (25 mcg total) by mouth daily before breakfast. 30 tablet 0  . losartan (COZAAR) 100 MG tablet Take 100 mg by mouth daily.    . meclizine (ANTIVERT) 25 MG tablet Take 1 tablet (25 mg total) by mouth 3 (three) times daily as needed for dizziness or nausea. 30 tablet 1  . methotrexate (RHEUMATREX) 2.5 MG tablet Take 10 mg by mouth once a week. Caution:Chemotherapy. Protect from light. On momdays    .  polyethylene glycol (MIRALAX / GLYCOLAX) packet Take 17 g by mouth 2 (two) times daily as needed (constiaption).     . potassium chloride (K-DUR,KLOR-CON) 10 MEQ tablet Take 10 mEq by mouth daily.    Marland Kitchen ketoconazole (NIZORAL) 2 % cream Apply 1 application topically daily. 45 g 1  . naproxen sodium (ALEVE) 220 MG tablet Take 220 mg by mouth 2 (two) times a week.    . pantoprazole (PROTONIX) 40 MG tablet Take 1 tablet (40 mg total) by mouth daily. 30 tablet 11   No facility-administered medications prior to visit.   Past Medical History  Diagnosis Date  . Abdominal pain, generalized 02/05/2008  . ABDOMINAL PAIN-EPIGASTRIC 03/24/2009  . Acute bronchitis 02/15/2007  . BACK PAIN 07/20/2008  . BREAST CANCER, HX OF 04/16/2007  . CHEST PAIN 02/23/2010  . COLONIC POLYPS, ADENOMATOUS, HX OF 10/01/2007  . CONSTIPATION, CHRONIC 05/10/2007  . Dysuria 04/06/2008  . FATIGUE 12/26/2006  . FATTY LIVER DISEASE 11/19/2006  . FIBROMYALGIA 11/19/2006  . HEMATOCHEZIA 05/10/2007  . HEMORRHOIDS, INTERNAL 05/10/2007  . HIP PAIN 07/20/2008  . HYPERLIPIDEMIA 07/26/2009  . HYPERTENSION 12/26/2006  . HYPOKALEMIA 11/25/2007  . HYPOTHYROIDISM 12/26/2006  . IBS 11/19/2006  . INSOMNIA, PERSISTENT 12/26/2006  . KELOID 07/25/2007  . MURMUR 07/25/2007  . Nausea alone 03/23/2010  . PARESTHESIA  04/06/2008  . RENAL CYST, LEFT 02/23/2010  . Rheumatoid arthritis(714.0) 11/19/2006    Dr Vianne Bulls  . RUQ PAIN 11/07/2009  . 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 GERD 11/19/2006    Dr Fuller Plan  . Breast cancer     l, hx of XRT Dr Benay Spice  . Adenomatous colon polyp 04/2000  . ASCUS favor benign 10/2011    negative high risk HPV screen  . Fatty liver   . Tubular adenoma    Past Surgical History  Procedure Laterality Date  . Left thyroidectomy    . Breast surgery  2009    Left lumpectomy  . Os cataract extraction  2009  . Shoulder surgery      both shoulders  . Tubal ligation    . Knee surgery Left   . Cyst  excision from chest    . Ovarian cyst removal  age 42   History   Social History  . Marital Status: Married    Spouse Name: N/A  . Number of Children: 5  . Years of Education: N/A   Occupational History  . retired    Social History Main Topics  . Smoking status: Never Smoker   . Smokeless tobacco: Never Used  . Alcohol Use: No  . Drug Use: No  . Sexual Activity: Not Currently   Other Topics Concern  . None   Social History Narrative   Patient does not get regular exercise   Family History  Problem Relation Age of Onset  . Lung cancer Brother   . Colon cancer Brother   . Diabetes Father   . Stroke Sister   . Brain cancer Brother       Physical Exam: General: Well developed , well nourished, no acute distress Head: Normocephalic and atraumatic Eyes:  sclerae anicteric, EOMI Ears: Normal auditory acuity Mouth: No deformity or lesions Lungs: Clear throughout to auscultation Heart: Regular rate and rhythm; no murmurs, rubs or bruits Abdomen: Soft, minimal epigastric tenderness and non distended. No masses, hepatosplenomegaly or hernias noted. Normal Bowel sounds. The epgastric lump noted by the patient is her xiphoid process. Musculoskeletal: Symmetrical with no gross deformities  Pulses:  Normal pulses noted Extremities: No clubbing, cyanosis, edema or deformities noted Neurological: Alert oriented x 4, grossly nonfocal Psychological:  Alert and cooperative. Normal mood and affect  Assessment and Recommendations:  1. GERD leading to epigastric pain, lower sternal pain associated with swallowing. Her flare in GERD is once again due to medication noncompliance. Compliance with PPI BID has been reviewed with her multiple times in the past. We reviewed it again today. Schedule barium esophagram. Begin pantoprazole 40 mg twice a day for long-term use to adequately control her GERD. Follow all standard antireflux measures.  2. Epigastric lump is her xiphoid process.  This is normal anatomy. Patient is reassured.  3. Chronic IBS-constipation. Noncompliance. MiraLax needed on a regular schedule 1-3 times every day and not as needed. I have advised this several times previously. I have again tried to clearly emphasized that she needs to remain on a dose of MiraLAX every day between 1 and 3 times daily and that she adjusts the number of doses for adequate bowel movements. At the minimum she is to take 1 dose of MiraLAX every day. Consider Amitiza if above not effective.  4. History of adenomatous colon polyps. Surveillance colonoscopy due June 2019.   5. Fatty liver. Long term low fat diet and weight loss.

## 2014-02-26 ENCOUNTER — Ambulatory Visit (HOSPITAL_COMMUNITY)
Admission: RE | Admit: 2014-02-26 | Discharge: 2014-02-26 | Disposition: A | Discharge status: Home / Self Care | Payer: Medicare Other | Source: Ambulatory Visit | Attending: Gastroenterology | Admitting: Gastroenterology

## 2014-02-26 ENCOUNTER — Encounter: Payer: Self-pay | Admitting: Gastroenterology

## 2014-02-26 DIAGNOSIS — R1013 Epigastric pain: Secondary | ICD-10-CM

## 2014-02-26 DIAGNOSIS — K449 Diaphragmatic hernia without obstruction or gangrene: Secondary | ICD-10-CM | POA: Insufficient documentation

## 2014-02-26 DIAGNOSIS — K219 Gastro-esophageal reflux disease without esophagitis: Secondary | ICD-10-CM | POA: Diagnosis not present

## 2014-02-26 DIAGNOSIS — R1310 Dysphagia, unspecified: Secondary | ICD-10-CM | POA: Diagnosis present

## 2014-03-02 ENCOUNTER — Telehealth: Payer: Self-pay | Admitting: Gastroenterology

## 2014-03-02 NOTE — Telephone Encounter (Signed)
I spoke with the patient and explained that the barium swallow only looks at the esophagus.  She is worried that the arthritis medications she is being prescribed would ruin her liver and wanted to know if we had any x-ray of it.  She is advised that she has a history of fatty liver, but no other known liver disease.  She will call back for any additional questions or concerns

## 2014-03-15 ENCOUNTER — Telehealth: Payer: Self-pay | Admitting: Gastroenterology

## 2014-03-15 DIAGNOSIS — K219 Gastro-esophageal reflux disease without esophagitis: Secondary | ICD-10-CM

## 2014-03-15 DIAGNOSIS — R1013 Epigastric pain: Secondary | ICD-10-CM

## 2014-03-15 MED ORDER — PANTOPRAZOLE SODIUM 40 MG PO TBEC: 40.0000 mg | DELAYED_RELEASE_TABLET | Freq: Two times a day (BID) | ORAL | Status: DC

## 2014-03-15 NOTE — Telephone Encounter (Signed)
Spoke with Lennette Bihari at [facility] and he states it looks like the patient picked up a 30 day supple prescription from her pharmacy on 03/14/14 but the prescription was sent in for 60 day supply. Called patient and she states she did receive a enough to take it once a day. Patient states she is worried she will run out if she increases it to twice daily now. Told patient to wait until I can get an approval from her insurance company. I do not want her running out of medicine to early. Patient agreed and states she is actually doing better since she has started the reflux diet. Told her I will contact her with an approval or denial from her insurance company.

## 2014-03-15 NOTE — Telephone Encounter (Signed)
Patient would like a 90 day supply sent to her pharmacy and she states she also will need a PA because the pharmacist told her that her insurance will not cover it for twice daily dosing. Told patient I will send the prescription for 90 day supply and wait on the pharmacy to send me a rejection and then call the insurance for a PA.

## 2014-03-16 NOTE — Telephone Encounter (Signed)
[employer] called and states the pantoprazole twice daily was approved from 03/15/14-03/15/15. They state they have called the patient and notified her of the approval.

## 2014-04-01 ENCOUNTER — Other Ambulatory Visit: Payer: Self-pay | Admitting: Internal Medicine

## 2014-04-05 ENCOUNTER — Encounter: Payer: Self-pay | Admitting: Internal Medicine

## 2014-04-05 ENCOUNTER — Ambulatory Visit (INDEPENDENT_AMBULATORY_CARE_PROVIDER_SITE_OTHER): Payer: Medicare Other | Admitting: Internal Medicine

## 2014-04-05 ENCOUNTER — Other Ambulatory Visit (INDEPENDENT_AMBULATORY_CARE_PROVIDER_SITE_OTHER): Payer: Medicare Other

## 2014-04-05 VITALS — BP 130/80 | HR 67 | Wt 211.0 lb

## 2014-04-05 DIAGNOSIS — I10 Essential (primary) hypertension: Secondary | ICD-10-CM

## 2014-04-05 DIAGNOSIS — E039 Hypothyroidism, unspecified: Secondary | ICD-10-CM | POA: Diagnosis not present

## 2014-04-05 DIAGNOSIS — R1314 Dysphagia, pharyngoesophageal phase: Secondary | ICD-10-CM

## 2014-04-05 DIAGNOSIS — Z23 Encounter for immunization: Secondary | ICD-10-CM | POA: Diagnosis not present

## 2014-04-05 DIAGNOSIS — E119 Type 2 diabetes mellitus without complications: Secondary | ICD-10-CM

## 2014-04-05 LAB — CBC WITH DIFFERENTIAL/PLATELET
BASOS ABS: 0 10*3/uL (ref 0.0–0.1)
BASOS PCT: 0.4 % (ref 0.0–3.0)
Eosinophils Absolute: 0.1 10*3/uL (ref 0.0–0.7)
Eosinophils Relative: 2.3 % (ref 0.0–5.0)
HEMATOCRIT: 34.9 % — AB (ref 36.0–46.0)
Hemoglobin: 11.5 g/dL — ABNORMAL LOW (ref 12.0–15.0)
LYMPHS ABS: 1.6 10*3/uL (ref 0.7–4.0)
Lymphocytes Relative: 25.8 % (ref 12.0–46.0)
MCHC: 33 g/dL (ref 30.0–36.0)
MCV: 89 fl (ref 78.0–100.0)
MONOS PCT: 10.2 % (ref 3.0–12.0)
Monocytes Absolute: 0.6 10*3/uL (ref 0.1–1.0)
Neutro Abs: 3.8 10*3/uL (ref 1.4–7.7)
Neutrophils Relative %: 61.3 % (ref 43.0–77.0)
Platelets: 223 10*3/uL (ref 150.0–400.0)
RBC: 3.92 Mil/uL (ref 3.87–5.11)
RDW: 15 % (ref 11.5–15.5)
WBC: 6.2 10*3/uL (ref 4.0–10.5)

## 2014-04-05 LAB — BASIC METABOLIC PANEL
BUN: 14 mg/dL (ref 6–23)
CO2: 26 mEq/L (ref 19–32)
Calcium: 9.6 mg/dL (ref 8.4–10.5)
Chloride: 103 mEq/L (ref 96–112)
Creatinine, Ser: 1.05 mg/dL (ref 0.40–1.20)
GFR: 65.78 mL/min (ref >60.00)
Glucose, Bld: 108 mg/dL — ABNORMAL HIGH (ref 70–99)
Potassium: 3.7 mEq/L (ref 3.5–5.1)
Sodium: 138 mEq/L (ref 135–145)

## 2014-04-05 LAB — TSH: TSH: 1.62 u[IU]/mL (ref 0.35–4.50)

## 2014-04-05 LAB — HEMOGLOBIN A1C: Hgb A1c MFr Bld: 6.5 % (ref 4.6–6.5)

## 2014-04-05 NOTE — Patient Instructions (Signed)
Try Tylenol PM for insomnia

## 2014-04-05 NOTE — Assessment & Plan Note (Signed)
Losartan, amlodipine, Coreg 

## 2014-04-05 NOTE — Progress Notes (Signed)
Subjective:    HPI    C/o insomnia  F/u FMS - using Aleve prn, IBS - not worse  F/u B ankles swelling at times  F/u HTN, RA and elev CBG. C/o fatigue  Wt Readings from Last 3 Encounters:  04/05/14 211 lb (95.709 kg)  02/19/14 209 lb 4 oz (94.915 kg)  01/04/14 209 lb (94.802 kg)   BP Readings from Last 3 Encounters:  04/05/14 130/80  02/19/14 136/72  01/04/14 140/80      Review of Systems  Constitutional: Positive for fatigue. Negative for diaphoresis, activity change, appetite change and unexpected weight change.  HENT: Negative for congestion, facial swelling, hearing loss, mouth sores, nosebleeds, sinus pressure, sneezing, tinnitus and trouble swallowing.   Eyes: Negative for pain, discharge, redness, itching and visual disturbance.  Respiratory: Negative for cough, chest tightness and stridor.   Cardiovascular: Negative for palpitations and leg swelling.  Gastrointestinal: Positive for constipation and abdominal distention. Negative for nausea, vomiting, diarrhea, blood in stool, anal bleeding and rectal pain.  Genitourinary: Negative for urgency, frequency, hematuria, flank pain, vaginal bleeding, vaginal discharge, difficulty urinating and genital sores.  Musculoskeletal: Positive for arthralgias. Negative for joint swelling, gait problem, neck pain and neck stiffness.  Skin: Negative.   Neurological: Negative for dizziness, tremors, seizures, syncope and speech difficulty.  Hematological: Negative for adenopathy. Does not bruise/bleed easily.  Psychiatric/Behavioral: Negative for suicidal ideas, behavioral problems, sleep disturbance, dysphoric mood and decreased concentration. The patient is not nervous/anxious.        Objective:   Physical Exam  Constitutional: She appears well-developed. No distress.  HENT:  Head: Normocephalic.  Right Ear: External ear normal.  Left Ear: External ear normal.  Nose: Nose normal.  Mouth/Throat: Oropharynx is clear and  moist.  Eyes: Conjunctivae are normal. Pupils are equal, round, and reactive to light. Right eye exhibits no discharge. Left eye exhibits no discharge.  Neck: Normal range of motion. Neck supple. No JVD present. No tracheal deviation present. No thyromegaly present.  Cardiovascular: Normal rate, regular rhythm and normal heart sounds.   Pulmonary/Chest: No stridor. No respiratory distress. She has no wheezes.  Abdominal: Soft. Bowel sounds are normal. She exhibits no distension and no mass. There is no tenderness. There is no rebound and no guarding.  Musculoskeletal: She exhibits no edema or tenderness.  Lymphadenopathy:    She has no cervical adenopathy.  Neurological: She displays normal reflexes. No cranial nerve deficit. She exhibits normal muscle tone. Coordination normal.  Skin: No rash noted. No erythema.  Psychiatric: She has a normal mood and affect. Her behavior is normal. Judgment and thought content normal.  Obese  Lab Results  Component Value Date   WBC 5.8 09/28/2013   HGB 11.7* 09/28/2013   HCT 36.2 09/28/2013   PLT 255 09/28/2013   GLUCOSE 94 01/04/2014   CHOL 198 07/14/2013   TRIG 82.0 07/14/2013   HDL 46.50 07/14/2013   LDLDIRECT 180.9 07/26/2009   LDLCALC 135* 07/14/2013   ALT 16 06/24/2012   AST 22 06/24/2012   NA 143 01/04/2014   K 3.9 01/04/2014   CL 105 01/04/2014   CREATININE 0.9 01/04/2014   BUN 12 01/04/2014   CO2 28 01/04/2014   TSH 1.74 01/04/2014   HGBA1C 6.5 01/04/2014   8/14 LS MRI: IMPRESSION:   1. Advanced facet disease at L5-S1 with a resulting  anterolisthesis and diffuse disc bulging. 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  2. Slightly lesser facet disease and anterolisthesis at L4-L5 with  mild resulting central, lateral recess and foraminal stenosis  bilaterally.  3. No other significant disc space findings.  Original Report Authenticated By: Richardean Sale, M.D.  EKG, CXR ok in 9/15    Assessment & Plan:  Patient ID: Deanna Salazar, female   DOB: 01-Nov-1940, 74 y.o.   MRN: [id number]

## 2014-04-05 NOTE — Assessment & Plan Note (Signed)
On Protonix 

## 2014-04-05 NOTE — Addendum Note (Signed)
Addended by: Cresenciano Lick on: 04/05/2014 04:45 PM   Modules accepted: Orders

## 2014-04-05 NOTE — Assessment & Plan Note (Signed)
  On diet  

## 2014-04-05 NOTE — Assessment & Plan Note (Signed)
On Levothroid 

## 2014-04-05 NOTE — Progress Notes (Signed)
Pre visit review using our clinic review tool, if applicable. No additional management support is needed unless otherwise documented below in the visit note. 

## 2014-05-21 ENCOUNTER — Other Ambulatory Visit: Payer: Self-pay | Admitting: Internal Medicine

## 2014-05-26 ENCOUNTER — Other Ambulatory Visit: Payer: Self-pay | Admitting: Internal Medicine

## 2014-05-27 ENCOUNTER — Other Ambulatory Visit: Payer: Self-pay | Admitting: Internal Medicine

## 2014-06-08 ENCOUNTER — Telehealth: Payer: Self-pay | Admitting: Internal Medicine

## 2014-06-08 NOTE — Telephone Encounter (Signed)
Patient states she would like a call back in regards to how much levothyroxine she should be taking.  State the hospital told her to take a half tablet a day.  She had a followed up with Dr. Alain Marion and he told her to continue this.  She states pharmacy called in for script for levothyroxine and the instructions states to take a whole tablet a day.  She would like clarification on this.

## 2014-06-09 NOTE — Telephone Encounter (Signed)
Pt informed

## 2014-06-09 NOTE — Telephone Encounter (Signed)
Pt needs to take the same Levothyroxine dose she was on before April 05, 2014 (ast TSH test) Thx

## 2014-06-28 ENCOUNTER — Ambulatory Visit (INDEPENDENT_AMBULATORY_CARE_PROVIDER_SITE_OTHER): Payer: Medicare Other | Admitting: Internal Medicine

## 2014-06-28 ENCOUNTER — Encounter: Payer: Self-pay | Admitting: Internal Medicine

## 2014-06-28 VITALS — BP 140/80 | HR 57 | Wt 210.0 lb

## 2014-06-28 DIAGNOSIS — R0689 Other abnormalities of breathing: Secondary | ICD-10-CM | POA: Diagnosis not present

## 2014-06-28 DIAGNOSIS — E119 Type 2 diabetes mellitus without complications: Secondary | ICD-10-CM | POA: Diagnosis not present

## 2014-06-28 DIAGNOSIS — R0600 Dyspnea, unspecified: Secondary | ICD-10-CM

## 2014-06-28 DIAGNOSIS — Z0000 Encounter for general adult medical examination without abnormal findings: Secondary | ICD-10-CM | POA: Insufficient documentation

## 2014-06-28 NOTE — Progress Notes (Signed)
Subjective:    Shortness of Breath This is a new problem. The current episode started more than 1 month ago. The problem occurs intermittently. The problem has been waxing and waning. Associated symptoms include chest pain. Pertinent negatives include no abdominal pain, leg swelling, neck pain, rhinorrhea, vomiting or wheezing. She has tried nothing for the symptoms. There is no history of allergies.  Chest Pain  Associated symptoms include shortness of breath. Pertinent negatives include no abdominal pain, cough, diaphoresis, dizziness, nausea, palpitations or vomiting.  Pertinent negatives for past medical history include no seizures.   No fam h/o MI  Pt is c/o "panting for breath" in 30-45 min of walking and 2 long hills...   F/u insomnia  F/u FMS - using Aleve prn, IBS - not worse  F/u B ankles swelling at times  F/u HTN, RA and elev CBG. C/o fatigue  Wt Readings from Last 3 Encounters:  06/28/14 210 lb (95.255 kg)  04/05/14 211 lb (95.709 kg)  02/19/14 209 lb 4 oz (94.915 kg)   BP Readings from Last 3 Encounters:  06/28/14 140/80  04/05/14 130/80  02/19/14 136/72    Review of Systems  Constitutional: Positive for fatigue. Negative for diaphoresis, activity change, appetite change and unexpected weight change.  HENT: Negative for congestion, facial swelling, hearing loss, mouth sores, nosebleeds, rhinorrhea, sinus pressure, sneezing, tinnitus and trouble swallowing.   Eyes: Negative for pain, discharge, redness, itching and visual disturbance.  Respiratory: Positive for shortness of breath. Negative for cough, chest tightness, wheezing and stridor.   Cardiovascular: Positive for chest pain. Negative for palpitations and leg swelling.  Gastrointestinal: Positive for constipation and abdominal distention. Negative for nausea, vomiting, abdominal pain, diarrhea, blood in stool, anal bleeding and rectal pain.  Genitourinary: Negative for urgency, frequency, hematuria,  flank pain, vaginal bleeding, vaginal discharge, difficulty urinating and genital sores.  Musculoskeletal: Positive for arthralgias. Negative for joint swelling, gait problem, neck pain and neck stiffness.  Skin: Negative.   Neurological: Negative for dizziness, tremors, seizures, syncope and speech difficulty.  Hematological: Negative for adenopathy. Does not bruise/bleed easily.  Psychiatric/Behavioral: Negative for suicidal ideas, behavioral problems, sleep disturbance, dysphoric mood and decreased concentration. The patient is not nervous/anxious.        Objective:   Physical Exam  Constitutional: She appears well-developed. No distress.  HENT:  Head: Normocephalic.  Right Ear: External ear normal.  Left Ear: External ear normal.  Nose: Nose normal.  Mouth/Throat: Oropharynx is clear and moist.  Eyes: Conjunctivae are normal. Pupils are equal, round, and reactive to light. Right eye exhibits no discharge. Left eye exhibits no discharge.  Neck: Normal range of motion. Neck supple. No JVD present. No tracheal deviation present. No thyromegaly present.  Cardiovascular: Normal rate, regular rhythm and normal heart sounds.   Pulmonary/Chest: No stridor. No respiratory distress. She has no wheezes.  Abdominal: Soft. Bowel sounds are normal. She exhibits no distension and no mass. There is no tenderness. There is no rebound and no guarding.  Musculoskeletal: She exhibits no edema or tenderness.  Lymphadenopathy:    She has no cervical adenopathy.  Neurological: She displays normal reflexes. No cranial nerve deficit. She exhibits normal muscle tone. Coordination normal.  Skin: No rash noted. No erythema.  Psychiatric: She has a normal mood and affect. Her behavior is normal. Judgment and thought content normal.  Obese RRR Lab Results  Component Value Date   WBC 6.2 04/05/2014   HGB 11.5* 04/05/2014   HCT 34.9* 04/05/2014  PLT 223.0 04/05/2014   GLUCOSE 108* 04/05/2014   CHOL 198  07/14/2013   TRIG 82.0 07/14/2013   HDL 46.50 07/14/2013   LDLDIRECT 180.9 07/26/2009   LDLCALC 135* 07/14/2013   ALT 16 06/24/2012   AST 22 06/24/2012   NA 138 04/05/2014   K 3.7 04/05/2014   CL 103 04/05/2014   CREATININE 1.05 04/05/2014   BUN 14 04/05/2014   CO2 26 04/05/2014   TSH 1.62 04/05/2014   HGBA1C 6.5 04/05/2014   8/14 LS MRI: IMPRESSION:   1. Advanced facet disease at L5-S1 with a resulting  anterolisthesis and diffuse disc bulging. 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  2. Slightly lesser facet disease and anterolisthesis at L4-L5 with  mild resulting central, lateral recess and foraminal stenosis  bilaterally.  3. No other significant disc space findings.  Original Report Authenticated By: Richardean Sale, M.D.  EKG, CXR ok in 9/15    CXR, ECHO 2015 Assessment & Plan:

## 2014-06-28 NOTE — Progress Notes (Signed)
Pre visit review using our clinic review tool, if applicable. No additional management support is needed unless otherwise documented below in the visit note. 

## 2014-06-28 NOTE — Assessment & Plan Note (Signed)
diet controlled Doing well Exercise, wt loss

## 2014-06-28 NOTE — Assessment & Plan Note (Signed)

## 2014-06-28 NOTE — Assessment & Plan Note (Addendum)
5/16 DOE - likely due to poor exercise tolerance/obesity CL stress test Reduce walking time and avoid hills

## 2014-06-28 NOTE — Patient Instructions (Signed)
Reduce walking time and avoid hills

## 2014-07-13 ENCOUNTER — Telehealth (HOSPITAL_COMMUNITY): Payer: Self-pay

## 2014-07-13 NOTE — Telephone Encounter (Signed)
Left message on voicemail per DPR in reference to upcoming appointment scheduled on 07-15-2014 at 9:30am with detailed instructions given per Myocardial Perfusion Study Information Sheet for the test. LM to arrive 15 minutes early, and that it is imperative to arrive on time for appointment to keep from having the test rescheduled.Phone number given for call back for any questions. Oletta Lamas, Ivyonna Hoelzel A

## 2014-07-14 ENCOUNTER — Telehealth (HOSPITAL_COMMUNITY): Payer: Self-pay

## 2014-07-14 NOTE — Telephone Encounter (Signed)
Patient given detailed instructions per Myocardial Perfusion Study Information Sheet for test on 07-15-2014 at 9:30. Patient Notified to arrive 15 minutes early, and that it is imperative to arrive on time for appointment to keep from having the test rescheduled. Patient verbalized understanding. Oletta Lamas, Tinya Cadogan A

## 2014-07-15 ENCOUNTER — Encounter (HOSPITAL_COMMUNITY): Payer: Medicare Other | Attending: Cardiovascular Disease

## 2014-07-15 DIAGNOSIS — R0600 Dyspnea, unspecified: Secondary | ICD-10-CM | POA: Diagnosis present

## 2014-07-15 DIAGNOSIS — E119 Type 2 diabetes mellitus without complications: Secondary | ICD-10-CM | POA: Insufficient documentation

## 2014-07-15 DIAGNOSIS — R0689 Other abnormalities of breathing: Secondary | ICD-10-CM | POA: Diagnosis not present

## 2014-07-15 LAB — MYOCARDIAL PERFUSION IMAGING
CHL CUP MPHR: 146 {beats}/min
CHL CUP RESTING HR STRESS: 63 {beats}/min
CHL RATE OF PERCEIVED EXERTION: 18
CSEPHR: 95 %
Estimated workload: 7 METS
Exercise duration (min): 5 min
Exercise duration (sec): 36 s
LV dias vol: 77 mL
LVSYSVOL: 18 mL
NUC STRESS TID: 0.92
Peak HR: 139 {beats}/min
RATE: 0.22
SDS: 5
SRS: 5
SSS: 10

## 2014-07-15 MED ORDER — TECHNETIUM TC 99M SESTAMIBI GENERIC - CARDIOLITE
10.1000 | Freq: Once | INTRAVENOUS | Status: AC | PRN
  Administered 2014-07-15: 10.1 via INTRAVENOUS

## 2014-07-15 MED ORDER — TECHNETIUM TC 99M SESTAMIBI GENERIC - CARDIOLITE
31.4000 | Freq: Once | INTRAVENOUS | Status: AC | PRN
  Administered 2014-07-15: 31 via INTRAVENOUS

## 2014-07-26 ENCOUNTER — Telehealth: Payer: Self-pay | Admitting: Internal Medicine

## 2014-07-26 MED ORDER — MECLIZINE HCL 25 MG PO TABS
25.0000 mg | ORAL_TABLET | Freq: Three times a day (TID) | ORAL | Status: DC | PRN
Start: 2014-07-26 — End: 2015-12-07

## 2014-07-26 NOTE — Telephone Encounter (Signed)
Rx sent. See meds. Pt informed.  

## 2014-07-26 NOTE — Telephone Encounter (Signed)
Patient states she was in the hospital back in November and they prescribed her meclizine 25mg .  States that she did a follow up with Dr. Alain Marion after hospital visit.  Patient states symptoms have returned - dizziness started last week.  She states she has an upcoming appointment with Dr. Alain Marion.  She is requesting him to send a script of meclizine 25mg  to [facility] on [address].

## 2014-08-05 ENCOUNTER — Ambulatory Visit (INDEPENDENT_AMBULATORY_CARE_PROVIDER_SITE_OTHER): Payer: Medicare Other | Admitting: Internal Medicine

## 2014-08-05 ENCOUNTER — Other Ambulatory Visit (INDEPENDENT_AMBULATORY_CARE_PROVIDER_SITE_OTHER): Payer: Medicare Other

## 2014-08-05 ENCOUNTER — Encounter: Payer: Self-pay | Admitting: Internal Medicine

## 2014-08-05 VITALS — BP 135/85 | HR 61 | Wt 212.0 lb

## 2014-08-05 DIAGNOSIS — E119 Type 2 diabetes mellitus without complications: Secondary | ICD-10-CM | POA: Diagnosis not present

## 2014-08-05 DIAGNOSIS — M069 Rheumatoid arthritis, unspecified: Secondary | ICD-10-CM | POA: Diagnosis not present

## 2014-08-05 DIAGNOSIS — R5382 Chronic fatigue, unspecified: Secondary | ICD-10-CM

## 2014-08-05 DIAGNOSIS — I10 Essential (primary) hypertension: Secondary | ICD-10-CM | POA: Diagnosis not present

## 2014-08-05 LAB — CBC WITH DIFFERENTIAL/PLATELET
BASOS PCT: 0.8 % (ref 0.0–3.0)
Basophils Absolute: 0 10*3/uL (ref 0.0–0.1)
Eosinophils Absolute: 0.1 10*3/uL (ref 0.0–0.7)
Eosinophils Relative: 2.6 % (ref 0.0–5.0)
HCT: 36.2 % (ref 36.0–46.0)
HEMOGLOBIN: 12 g/dL (ref 12.0–15.0)
LYMPHS PCT: 35.1 % (ref 12.0–46.0)
Lymphs Abs: 1.9 10*3/uL (ref 0.7–4.0)
MCHC: 33.2 g/dL (ref 30.0–36.0)
MCV: 89.2 fl (ref 78.0–100.0)
Monocytes Absolute: 0.4 10*3/uL (ref 0.1–1.0)
Monocytes Relative: 7.9 % (ref 3.0–12.0)
NEUTROS PCT: 53.6 % (ref 43.0–77.0)
Neutro Abs: 2.9 10*3/uL (ref 1.4–7.7)
Platelets: 219 10*3/uL (ref 150.0–400.0)
RBC: 4.05 Mil/uL (ref 3.87–5.11)
RDW: 14.9 % (ref 11.5–15.5)
WBC: 5.3 10*3/uL (ref 4.0–10.5)

## 2014-08-05 LAB — HEPATIC FUNCTION PANEL
ALK PHOS: 106 U/L (ref 39–117)
ALT: 12 U/L (ref 0–35)
AST: 20 U/L (ref 0–37)
Albumin: 4.1 g/dL (ref 3.5–5.2)
BILIRUBIN DIRECT: 0.1 mg/dL (ref 0.0–0.3)
BILIRUBIN TOTAL: 0.6 mg/dL (ref 0.2–1.2)
Total Protein: 7.2 g/dL (ref 6.0–8.3)

## 2014-08-05 LAB — MICROALBUMIN / CREATININE URINE RATIO
CREATININE, U: 45 mg/dL
MICROALB/CREAT RATIO: 1.6 mg/g (ref 0.0–30.0)
Microalb, Ur: <0.7 mg/dL (ref 0.0–1.9)

## 2014-08-05 LAB — BASIC METABOLIC PANEL
BUN: 12 mg/dL (ref 6–23)
CO2: 30 meq/L (ref 19–32)
Calcium: 9.5 mg/dL (ref 8.4–10.5)
Chloride: 105 mEq/L (ref 96–112)
Creatinine, Ser: 1.03 mg/dL (ref 0.40–1.20)
GFR: 67.19 mL/min (ref >60.00)
GLUCOSE: 114 mg/dL — AB (ref 70–99)
Potassium: 4.3 mEq/L (ref 3.5–5.1)
Sodium: 139 mEq/L (ref 135–145)

## 2014-08-05 LAB — HEMOGLOBIN A1C: Hgb A1c MFr Bld: 6.3 % (ref 4.6–6.5)

## 2014-08-05 NOTE — Progress Notes (Signed)
Pre visit review using our clinic review tool, if applicable. No additional management support is needed unless otherwise documented below in the visit note. 

## 2014-08-05 NOTE — Assessment & Plan Note (Signed)
Chronic Losartan, amlodipine, Coreg Nl BP at home

## 2014-08-05 NOTE — Assessment & Plan Note (Signed)
On MTX Labs

## 2014-08-05 NOTE — Assessment & Plan Note (Signed)
Labs

## 2014-08-05 NOTE — Assessment & Plan Note (Signed)
CL stress test was ok Start walking

## 2014-08-05 NOTE — Progress Notes (Signed)
Subjective:  Patient ID: Deanna Salazar, female    DOB: July 29, 1940  Age: 74 y.o. MRN: [id number]  CC: No chief complaint on file.   HPI [employer] presents for HTN, fatigue, RA, insomnia f/u  Outpatient Prescriptions Prior to Visit  Medication Sig Dispense Refill  . acetaminophen (TYLENOL) 325 MG tablet Take 650 mg by mouth as needed for headache.     Marland Kitchen amLODipine (NORVASC) 5 MG tablet Take 1 tablet (5 mg total) by mouth daily. 90 tablet 3  . carvedilol (COREG) 12.5 MG tablet Take 1 tablet (12.5 mg total) by mouth 2 (two) times daily with a meal. 180 tablet 3  . Cholecalciferol (VITAMIN D3) 1000 UNITS CAPS Take 1,000 Units by mouth daily.     . folic acid (FOLVITE) 1 MG tablet Take 3 mg by mouth daily.     . furosemide (LASIX) 20 MG tablet Take 1 tablet (20 mg total) by mouth daily. 90 tablet 3  . KLOR-CON M10 10 MEQ tablet TAKE 1 TABLET BY MOUTH EVERY DAY 90 tablet 3  . levothyroxine (SYNTHROID, LEVOTHROID) 50 MCG tablet TAKE 1 TABLET BY MOUTH EVERY DAY 90 tablet 2  . losartan (COZAAR) 100 MG tablet Take 100 mg by mouth daily.    . meclizine (ANTIVERT) 25 MG tablet Take 1 tablet (25 mg total) by mouth 3 (three) times daily as needed for dizziness or nausea. 30 tablet 0  . methotrexate (RHEUMATREX) 2.5 MG tablet Take 10 mg by mouth once a week. Caution:Chemotherapy. Protect from light. On momdays    . pantoprazole (PROTONIX) 40 MG tablet Take 1 tablet (40 mg total) by mouth 2 (two) times daily. 180 tablet 3  . polyethylene glycol (MIRALAX / GLYCOLAX) packet Take 17 g by mouth 2 (two) times daily as needed (constiaption).     Marland Kitchen HYDROcodone-acetaminophen (NORCO/VICODIN) 5-325 MG per tablet Take 1 tablet by mouth every 6 (six) hours as needed.  0   No facility-administered medications prior to visit.    ROS Review of Systems  Constitutional: Positive for fatigue. Negative for chills, activity change, appetite change and unexpected weight change.  HENT: Negative for congestion,  mouth sores and sinus pressure.   Eyes: Negative for visual disturbance.  Respiratory: Negative for cough and chest tightness.   Gastrointestinal: Negative for nausea and abdominal pain.  Genitourinary: Negative for frequency, difficulty urinating and vaginal pain.  Musculoskeletal: Positive for arthralgias. Negative for back pain and gait problem.  Skin: Negative for pallor and rash.  Neurological: Negative for dizziness, tremors, weakness, numbness and headaches.  Psychiatric/Behavioral: Negative for confusion, sleep disturbance and decreased concentration. The patient is nervous/anxious.     Objective:  BP 158/90 mmHg  Pulse 61  Wt 212 lb (96.163 kg)  SpO2 97%  BP Readings from Last 3 Encounters:  08/05/14 158/90  06/28/14 140/80  04/05/14 130/80    Wt Readings from Last 3 Encounters:  08/05/14 212 lb (96.163 kg)  07/15/14 210 lb (95.255 kg)  06/28/14 210 lb (95.255 kg)    Physical Exam  Constitutional: She appears well-developed. No distress.  HENT:  Head: Normocephalic.  Right Ear: External ear normal.  Left Ear: External ear normal.  Nose: Nose normal.  Mouth/Throat: Oropharynx is clear and moist.  Eyes: Conjunctivae are normal. Pupils are equal, round, and reactive to light. Right eye exhibits no discharge. Left eye exhibits no discharge.  Neck: Normal range of motion. Neck supple. No JVD present. No tracheal deviation present. No thyromegaly present.  Cardiovascular:  Normal rate, regular rhythm and normal heart sounds.   Pulmonary/Chest: No stridor. No respiratory distress. She has no wheezes.  Abdominal: Soft. Bowel sounds are normal. She exhibits no distension and no mass. There is no tenderness. There is no rebound and no guarding.  Musculoskeletal: She exhibits tenderness. She exhibits no edema.  Lymphadenopathy:    She has no cervical adenopathy.  Neurological: She displays normal reflexes. No cranial nerve deficit. She exhibits normal muscle tone.  Coordination normal.  Skin: No rash noted. No erythema.  Psychiatric: Her behavior is normal. Judgment and thought content normal.  painful joints, L knee  Lab Results  Component Value Date   WBC 6.2 04/05/2014   HGB 11.5* 04/05/2014   HCT 34.9* 04/05/2014   PLT 223.0 04/05/2014   GLUCOSE 108* 04/05/2014   CHOL 198 07/14/2013   TRIG 82.0 07/14/2013   HDL 46.50 07/14/2013   LDLDIRECT 180.9 07/26/2009   LDLCALC 135* 07/14/2013   ALT 16 06/24/2012   AST 22 06/24/2012   NA 138 04/05/2014   K 3.7 04/05/2014   CL 103 04/05/2014   CREATININE 1.05 04/05/2014   BUN 14 04/05/2014   CO2 26 04/05/2014   TSH 1.62 04/05/2014   HGBA1C 6.5 04/05/2014    Dg Esophagus  02/26/2014   CLINICAL DATA:  Dysphagia.  EXAM: ESOPHOGRAM/BARIUM SWALLOW  TECHNIQUE: Combined double contrast and single contrast examination performed using effervescent crystals, thick barium liquid, and thin barium liquid.  FLUOROSCOPY TIME:  1 minutes and 46 seconds  COMPARISON:  None.  FINDINGS: Initial barium swallows demonstrate normal pharyngeal motion with swallowing. No laryngeal penetration or aspiration. No upper esophageal webs, strictures or diverticuli. The piriform sinus and vallecular air spaces appear normal. Moderate to advanced degenerative changes involving the cervical spine. Anterior spurring most notable at C5-6.  Normal esophageal motility. Occasional proximal escape and occasional tertiary contractions. There is a small sliding-type hiatal hernia with inducible GE reflux. No esophageal stricture or mass. The 13 mm barium pill passed into the stomach without difficulty.  IMPRESSION: Small sliding-type hiatal hernia and inducible GE reflux.  No esophageal stricture or mass.   Electronically Signed   By: Marijo Sanes M.D.   On: 02/26/2014 13:43    Assessment & Plan:   There are no diagnoses linked to this encounter. I am having Ms. Losh maintain her acetaminophen, Vitamin D3, polyethylene glycol,  methotrexate, folic acid, amLODipine, carvedilol, losartan, furosemide, HYDROcodone-acetaminophen, pantoprazole, levothyroxine, KLOR-CON M10, and meclizine.  No orders of the defined types were placed in this encounter.     Follow-up: No Follow-up on file.  Walker Kehr, MD

## 2014-09-08 ENCOUNTER — Telehealth: Payer: Self-pay | Admitting: Gastroenterology

## 2014-09-08 NOTE — Telephone Encounter (Signed)
Patient with change in bowel habits.  2 month history of not having any urge to have a BM.  Is taking Miralax BID and has soft stools, but still not having a BM unless she takes dulcolax every 4-5 days.  She will come in and see Tye Savoy RNP on 09/13/14 3:00

## 2014-09-14 ENCOUNTER — Encounter: Payer: Self-pay | Admitting: Nurse Practitioner

## 2014-09-14 ENCOUNTER — Other Ambulatory Visit (INDEPENDENT_AMBULATORY_CARE_PROVIDER_SITE_OTHER): Payer: Medicare Other

## 2014-09-14 ENCOUNTER — Ambulatory Visit (INDEPENDENT_AMBULATORY_CARE_PROVIDER_SITE_OTHER): Payer: Medicare Other | Admitting: Nurse Practitioner

## 2014-09-14 VITALS — BP 130/70 | HR 84 | Ht 63.0 in | Wt 210.0 lb

## 2014-09-14 DIAGNOSIS — K5909 Other constipation: Secondary | ICD-10-CM | POA: Diagnosis not present

## 2014-09-14 LAB — TSH: TSH: 3.08 u[IU]/mL (ref 0.35–4.50)

## 2014-09-14 MED ORDER — LINACLOTIDE 145 MCG PO CAPS: 145.0000 ug | ORAL_CAPSULE | Freq: Every day | ORAL | Status: DC

## 2014-09-14 NOTE — Patient Instructions (Addendum)
Please go to the basement level to have your labs drawn.  We sent a prescription to [facility] [address]for Linzess 145 mcg.    You can use Fleets Glycerin suppositories.  You can also use Fleets Mineral Oil enemas as needed.  Use a step stool with defecation. High fiber diet.

## 2014-09-14 NOTE — Progress Notes (Signed)
     History of Present Illness:   Patient is a 74 year old female known to Dr. Fuller Plan. She has a history of GERD, constipation predominant irritable bowel syndrome, and adenomatous colon polyps. Today she is here for bowel changes. Patient "very, very concerned" she has a blockage. Her brother died of colon cancer.   Patient had been taking MiraLAX twice a day until 3 months ago. She stopped it because of loose stools. Currently using Dulcolax but scared to take too often in case there is a bowel blockage. Lower abdominal feels heavy. Patient feels that she needs to defecate but has difficulty expelling stools. Patient had success with Linzess, wants samples. CBC and CMET late July were unremarkable.   Current Medications, Allergies, Past Medical History, Past Surgical History, Family History and Social History were reviewed in [employer] record.  Physical Exam: General: Pleasant, black female in no acute distress Head: Normocephalic and atraumatic Ears: Normal auditory acuity Lungs: Clear throughout to auscultation Heart: Regular rate and rhythm Abdomen: Soft, non distended, non-tender. No masses, no hepatomegaly. Normal bowel sounds Musculoskeletal: Symmetrical with no gross deformities  Extremities: No edema  Neurological: Alert oriented x 4, grossly nonfocal Psychological:  Alert and cooperative. Normal mood and affect  Assessment and Recommendations:  34. 74 year old female with constipation predominant IBS. She stopped Miralax 3 months ago, made stools too loose. Patient anxious, concerned about possibility of an intestinal blockage / colon cancer. Abdominal exam unremarkable. Reassurance provided. She wants a prescription for Linzess. If insurance doesn't pay for it then will try glycerin suppositories or mineral oil enemas. Has hypothyroidism, late TSH in March was okay will recheck.   2. History of adenomatous colon polyps / [facility] of colon cancer in brother.  Four adenomatous polyps removed at time of last colonoscopy June 2014. Patient concerned that five years between surveillance colonoscopies is too long . Reassurance provided. I discussed with Dr. Fuller Plan, her primary GI. He feels that a five year surveillance plan is appropriate in her case

## 2014-09-15 NOTE — Progress Notes (Signed)
Reviewed and agree with management plan.  Lucile Didonato T. Clenton Esper, MD FACG 

## 2014-09-24 ENCOUNTER — Telehealth: Payer: Self-pay | Admitting: Gastroenterology

## 2014-09-24 NOTE — Telephone Encounter (Signed)
Patient was not able to afford linzess, rx was over $400.  She is advised she can try MOM to help the suppository stimulate a BM.  She will call back for any additional concerns

## 2014-10-20 ENCOUNTER — Telehealth: Payer: Self-pay | Admitting: *Deleted

## 2014-10-20 ENCOUNTER — Telehealth: Payer: Self-pay | Admitting: Internal Medicine

## 2014-10-20 NOTE — Telephone Encounter (Signed)
On 09-27-2014,. Faxed form back to [facility] , Prior authorization form, filled out.  Have not gotten a response.  LM for the patient to ask if she heard from [facility] regarding the [place].  Prior note, states her Linzess prescription was over $ 400.00.

## 2014-10-20 NOTE — Telephone Encounter (Signed)
Pt calling regarding her medication methotrexate (RHEUMATREX) 2.5 MG tablet [[id number]]  Can you please call her to discuss at [phone]

## 2014-10-21 NOTE — Telephone Encounter (Signed)
I called pt- she states the Methotrexate was prescribed by Dr. Amil Amen. She has not taken it for 3 weeks.  Dr. Amil Amen is no longer her provider and will pt be seeing someone else in that office. She wants to know if she should continue Folic Acid since she was taking it along with Methotrexate. Please advise.

## 2014-10-21 NOTE — Telephone Encounter (Signed)
Ok MTX Rx x2 mo Cont folic acid Thx

## 2014-10-22 NOTE — Telephone Encounter (Signed)
Pt informed- she states she wants to hold off on Methotrexate for now. She will discuss further at her 12/06/14 OV with PCP.

## 2014-10-25 NOTE — Telephone Encounter (Signed)
Noted. thx 

## 2014-10-27 ENCOUNTER — Other Ambulatory Visit: Payer: Self-pay | Admitting: Internal Medicine

## 2014-11-17 ENCOUNTER — Ambulatory Visit: Payer: Medicare Other

## 2014-11-17 VITALS — BP 138/80 | Ht 63.0 in | Wt 211.5 lb

## 2014-11-17 DIAGNOSIS — Z0000 Encounter for general adult medical examination without abnormal findings: Secondary | ICD-10-CM

## 2014-11-17 NOTE — Progress Notes (Addendum)
Subjective:   Deanna Salazar is a 74 y.o. female who presents for Medicare Annual (Subsequent) preventive examination.  Review of Systems:  HRA assessment completed during visit; The Patient was informed that this wellness visit is to identify risk and educate on how to reduce risk for increase disease through lifestyle changes.   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 Range 08/05/2014 11:02  Hemoglobin A1C Latest Ref Range: 4.6-6.5 % 6.3  Glucose Latest Ref Range: 70-99 mg/dL 114 (H)   Discussed BS and pre-diabetes; monitoring sugar Echo 65 to 70% ef Dropped x 2 lbs  BMI:  Diet; 1 slice of bread in the am; wheat; oatmeal;  Green tea; Milk and cereal (almond) raisin bran cereal Lunch; sandwich; ham and [place]; doesn't eat  a lot of cheese  TEA; drinks tea; stopping some bread so much; stopping OJ Supper; chicken; doesn't eat a lot of red meat; baked pork chops; salmon; Once  a month stew beef;  Does eat cake or sweets; 2 to 3 cookies;   Exercise; Started squats and some weights; discussed stretch bands;   5 days per a week; 28min on average Winter; walks with spouse during the week;   SAFETY; one level  Safety reviewed for the home; including removal of clutter; clear paths through the home, eliminating clutter, railing as needed; bathroom safety; uses shower; community safety; smoke detectors and firearms safety  Driving accidents no Sun protection: yes Stressors; no stressers  Uses night light at hs  Medication review/ takes Lasix as needed; does not take if she feels crampy  Fall assessment; last year; keep great and great-grand in the afternoon. Tripped over  the bike   Gait assessment no issues identified  Mobilization and Functional losses in the last year.; none Sleep patterns; sleeps well but does get up x 1 to void   Urinary or fecal incontinence reviewed/ gets up x 1 at hs;  Fecal; more constipation  AD: has not completed as yet  Was POA for brother which was not a good experience due to intense caregiving and no time to care for self   Counseling: Foot exam Colonoscopy; 06/12/2012 repeat in 5 years; 06/2017  EKG: 09/28/2013 Hearing: 4000hz  both ears Dexa 11/10/2013 normal t score Mammagram 01/11/2014 / u/s fup with neg; fup on 1 year Ophthalmology exam; Plans to schedule eye exam  Immunizations Due  Zostavax- discussed  Flu shot; took in 12/2013; will prefer to wait this year to see dr. Alain Marion   Current Care Team reviewed and updated GI Dr. Fuller Plan Dr. Annalee Genta, [place] doctor     Cardiac Risk Factors include: advanced age (>42men, >76 women);hypertension;obesity (BMI >30kg/m2)     Objective:     Vitals: BP 138/80 mmHg  Ht 5\' 3"  (1.6 m)  Wt 211 lb 8 oz (95.936 kg)  BMI 37.48 kg/m2  Tobacco History  Smoking status  . Never Smoker   Smokeless tobacco  . Never Used     Counseling given: Yes   Past Medical History  Diagnosis Date  . Abdominal pain, generalized 02/05/2008  . ABDOMINAL PAIN-EPIGASTRIC 03/24/2009  . Acute bronchitis 02/15/2007  .  BACK PAIN 07/20/2008  . BREAST CANCER, HX OF 04/16/2007  . CHEST PAIN 02/23/2010  . COLONIC POLYPS, ADENOMATOUS, HX OF 10/01/2007  . CONSTIPATION, CHRONIC 05/10/2007  . Dysuria 04/06/2008  . FATIGUE 12/26/2006  . FATTY LIVER DISEASE 11/19/2006  . FIBROMYALGIA 11/19/2006  . HEMATOCHEZIA 05/10/2007  . HEMORRHOIDS, INTERNAL 05/10/2007  . HIP PAIN 07/20/2008  . HYPERLIPIDEMIA 07/26/2009  . HYPERTENSION 12/26/2006  . HYPOKALEMIA 11/25/2007  . HYPOTHYROIDISM 12/26/2006  . IBS 11/19/2006  . INSOMNIA, PERSISTENT 12/26/2006  . KELOID 07/25/2007  . MURMUR 07/25/2007  .  Nausea alone 03/23/2010  . PARESTHESIA 04/06/2008  . RENAL CYST, LEFT 02/23/2010  . Rheumatoid arthritis(714.0) 11/19/2006    Dr Vianne Bulls  . RUQ PAIN 11/07/2009  . 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 GERD 11/19/2006    Dr Fuller Plan  . Breast cancer (HCC)     l, hx of XRT Dr Benay Spice  . Adenomatous colon polyp 04/2000  . ASCUS favor benign 10/2011    negative high risk HPV screen  . Fatty liver   . Tubular adenoma    Past Surgical History  Procedure Laterality Date  . Left thyroidectomy    . Breast surgery  2009    Left lumpectomy  . Os cataract extraction  2009  . Shoulder surgery      both shoulders  . Tubal ligation    . Knee surgery Left   . Cyst excision from chest    . Ovarian cyst removal  age 13   Family History  Problem Relation Age of Onset  . Lung cancer Brother   . Colon cancer Brother     in his early 57's  . Diabetes Father   . Stroke Sister   . Brain cancer Brother    History  Sexual Activity  . Sexual Activity: Not Currently    Outpatient Encounter Prescriptions as of 11/17/2014  Medication Sig  . acetaminophen (TYLENOL) 325 MG tablet Take 650 mg by mouth as needed for headache.   Marland Kitchen amLODipine (NORVASC) 5 MG tablet TAKE 1 TABLET BY MOUTH EVERY DAY  . carvedilol (COREG) 12.5 MG tablet Take 1 tablet (12.5 mg total) by mouth 2 (two) times daily with a meal.  . Cholecalciferol (VITAMIN D3) 1000 UNITS CAPS Take 1,000 Units by mouth daily.   . folic acid (FOLVITE) 1 MG tablet Take 3 mg by mouth daily.   . furosemide (LASIX) 20 MG tablet Take 1 tablet (20 mg total) by mouth daily.  Marland Kitchen KLOR-CON M10 10 MEQ tablet TAKE 1 TABLET BY MOUTH EVERY DAY  . levothyroxine (SYNTHROID, LEVOTHROID) 50 MCG tablet TAKE 1 TABLET BY MOUTH EVERY DAY  . losartan (COZAAR) 100 MG tablet Take 100 mg by mouth daily.  . meclizine (ANTIVERT) 25 MG tablet Take 1 tablet (25 mg total) by mouth 3 (three) times daily as needed for dizziness or nausea.  . pantoprazole  (PROTONIX) 40 MG tablet Take 1 tablet (40 mg total) by mouth 2 (two) times daily.  . Linaclotide (LINZESS) 145 MCG CAPS capsule Take 1 capsule (145 mcg total) by mouth daily. (Patient not taking: Reported on 11/17/2014)  . methotrexate (RHEUMATREX) 2.5 MG tablet Take 10 mg by mouth once a week. Caution:Chemotherapy. Protect from light. On momdays   No facility-administered encounter medications on file as of 11/17/2014.    Activities of Daily Living In your present state of health, do you have any difficulty performing the following activities: 11/17/2014  Hearing? N  Vision? N  Difficulty  concentrating or making decisions? N  Walking or climbing stairs? N  Dressing or bathing? N  Doing errands, shopping? N  Preparing Food and eating ? N  Using the Toilet? N  In the past six months, have you accidently leaked urine? N  Do you have problems with loss of bowel control? (No Data)  Managing your Medications? N  Managing your Finances? N  Housekeeping or managing your Housekeeping? N    Patient Care Team: Cassandria Anger, MD as PCP - General Hennie Duos, MD (Rheumatology) Newt Minion, MD (Orthopedic Surgery)    Assessment:     Assessment   Patient presents for yearly preventative medicine examination. Medicare questionnaire screening were completed, i.e. Functional; fall risk; depression, memory loss and hearing all reviewed; is careful regarding falls and last year, tripped of grand child's bike;   All immunizations and health maintenance protocols were reviewed with the patient and needed orders were placed./ Educated regarding shingles; Does not know if she will go back on methotrexate but has apt with Rh doctor soon and will check with them;  Will discuss wait and take flu shot with Dr. Judeen Hammans visit   Education provided for laboratory screens;    Medication reconciliation, past medical history, social history, problem list and allergies were reviewed in detail  with the patient; stopped linzess as it was not helping;   Goals were established with regard to weight loss, exercise, and diet in compliance with medications based on the patient individualized risk; Educated regarding Pre-diabetes; cutting back on sugar and watching labels for HFCS; Encouraged to continue to exercise   End of life planning was discussed and agreed to fup with family. Given resources   Exercise Activities and Dietary recommendations Current Exercise Habits:: Home exercise routine, Time (Minutes): 45, Frequency (Times/Week): 5, Weekly Exercise (Minutes/Week): 225, Intensity: Moderate  Goals    . Weight < 200 lb (90.719 kg)     Will continue the journey;  Will start to monitor sweets;  And keep exercising Low weight with some toning of muscles as tolerated      Fall Risk Fall Risk  11/17/2014 04/05/2014  Falls in the past year? Yes Yes  Number falls in past yr: 1 1  Injury with Fall? No Yes  Follow up Education provided -   Depression Screen PHQ 2/9 Scores 11/17/2014 04/05/2014  PHQ - 2 Score 0 0     Cognitive Testing No flowsheet data found.  Immunization History  Administered Date(s) Administered  . Influenza Split 10/30/2010, 01/16/2012  . Influenza Whole 03/03/2009  . Influenza,inj,Quad PF,36+ Mos 09/24/2012, 01/04/2014  . Pneumococcal Conjugate-13 04/05/2014  . Td 04/05/2014   Screening Tests Health Maintenance  Topic Date Due  . FOOT EXAM  08/24/1950  . OPHTHALMOLOGY EXAM  08/24/1950  . ZOSTAVAX  08/25/1999  . INFLUENZA VACCINE  08/09/2014  . HEMOGLOBIN A1C  02/05/2015  . PNA vac Low Risk Adult (2 of 2 - PPSV23) 04/05/2015  . URINE MICROALBUMIN  08/05/2015  . COLONOSCOPY  06/12/2017  . TETANUS/TDAP  04/04/2024  . DEXA SCAN  Completed      Plan:   Pre-diabetes and will try to monitor sugar and keep exercising;   Will review and consider advance directives; given info   Will take flu shot with Dr. Alain Marion apt.  Educated on shingles  vaccine; will talk to Rh doctor prior to restarting methotrexate; Will see Dr. Annalee Genta, [place]  [facility]; HDL 46 last year; try to aim for >  50 by exercise   To have vision exam scheduled soon   During the course of the visit the patient was educated and counseled about the following appropriate screening and preventive services:   Vaccines to include Pneumoccal, Influenza, Hepatitis B, Td, Zostavax, HCV/ deferred flu shot to OV with Plot; Will fup on shingles with [facility] doctor   Electrocardiogram/ 09/28/2013  Cardiovascular Disease/ BP under good control at present; monitors sodium in diet  Colorectal cancer screening/ due 2019  Bone density screening/ 2015 and was normal  Diabetes screening/ continue to monitor A1c  Glaucoma screening/ to be scheduled this week   Mammography/ completed 01/2014; repeats annual due to hx breast cancer; no issues since lumpectomy in left breast; does SBE  Nutrition counseling / given information on counting carbs in lieu of weight loss initiative;   Patient Instructions (the written plan) was given to the patient.   YFVCB,SWHQP, RN  11/17/2014   Medical screening examination/treatment/procedure(s) were performed by non-physician practitioner and as supervising physician I was immediately available for consultation/collaboration. I agree with above. Walker Kehr, MD

## 2014-11-17 NOTE — Patient Instructions (Addendum)
Deanna Salazar , Thank you for taking time to come for your Medicare Wellness Visit. I appreciate your ongoing commitment to your health goals. Please review the following plan we discussed and let me know if I can assist you in the future.   Pre-diabetes and will try to monitor sugar and keep exercising   Will review and consider advance directives; given info   Will take flu shot with Dr. Alain Marion apt.  Educated on shingles vaccine; will talk to Rh doctor prior to restarting methotrexate; Will see Dr. Annalee Genta, [facility]; HDL 46 last year; try to aim for > 50 by exercise   To have vision exam scheduled soon   These are the goals we discussed: Goals    . Weight < 200 lb (90.719 kg)     Will continue the journey;  Will start to monitor sweets;  And keep exercising Low weight with some toning of muscles as tolerated       This is a list of the screening recommended for you and due dates:  Health Maintenance  Topic Date Due  . Complete foot exam   08/24/1950  . Eye exam for diabetics  08/24/1950  . Shingles Vaccine  08/25/1999  . Flu Shot  08/09/2014  . Hemoglobin A1C  02/05/2015  . Pneumonia vaccines (2 of 2 - PPSV23) 04/05/2015  . Urine Protein Check  08/05/2015  . Colon Cancer Screening  06/12/2017  . Tetanus Vaccine  04/04/2024  . DEXA scan (bone density measurement)  Completed     Health Maintenance, Female Adopting a healthy lifestyle and getting preventive care can go a long way to promote health and wellness. Talk with your health care provider about what schedule of regular examinations is right for you. This is a good chance for you to check in with your provider about disease prevention and staying healthy. In between checkups, there are plenty of things you can do on your own. Experts have done a lot of research about which lifestyle changes and preventive measures are most likely to keep you healthy. Ask your health care provider for more  information. WEIGHT AND DIET  Eat a healthy diet  Be sure to include plenty of vegetables, fruits, low-fat dairy products, and lean protein.  Do not eat a lot of foods high in solid fats, added sugars, or salt.  Get regular exercise. This is one of the most important things you can do for your health.  Most adults should exercise for at least 150 minutes each week. The exercise should increase your heart rate and make you sweat (moderate-intensity exercise).  Most adults should also do strengthening exercises at least twice a week. This is in addition to the moderate-intensity exercise.  Maintain a healthy weight  Body mass index (BMI) is a measurement that can be used to identify possible weight problems. It estimates body fat based on height and weight. Your health care provider can help determine your BMI and help you achieve or maintain a healthy weight.  For females 60 years of age and older:   A BMI below 18.5 is considered underweight.  A BMI of 18.5 to 24.9 is normal.  A BMI of 25 to 29.9 is considered overweight.  A BMI of 30 and above is considered obese.  Watch levels of cholesterol and blood lipids  You should start having your blood tested for lipids and cholesterol at 75 years of age, then have this test every 5 years.  You may  need to have your cholesterol levels checked more often if:  Your lipid or cholesterol levels are high.  You are older than 75 years of age.  You are at high risk for heart disease.  CANCER SCREENING   Lung Cancer  Lung cancer screening is recommended for adults 23-55 years old who are at high risk for lung cancer because of a history of smoking.  A yearly low-dose CT scan of the lungs is recommended for people who:  Currently smoke.  Have quit within the past 15 years.  Have at least a 30-pack-year history of smoking. A pack year is smoking an average of one pack of cigarettes a day for 1 year.  Yearly screening should  continue until it has been 15 years since you quit.  Yearly screening should stop if you develop a health problem that would prevent you from having lung cancer treatment.  Breast Cancer  Practice breast self-awareness. This means understanding how your breasts normally appear and feel.  It also means doing regular breast self-exams. Let your health care provider know about any changes, no matter how small.  If you are in your 20s or 30s, you should have a clinical breast exam (CBE) by a health care provider every 1-3 years as part of a regular health exam.  If you are 73 or older, have a CBE every year. Also consider having a breast X-ray (mammogram) every year.  If you have a family history of breast cancer, talk to your health care provider about genetic screening.  If you are at high risk for breast cancer, talk to your health care provider about having an MRI and a mammogram every year.  Breast cancer gene (BRCA) assessment is recommended for women who have family members with BRCA-related cancers. BRCA-related cancers include:  Breast.  Ovarian.  Tubal.  Peritoneal cancers.  Results of the assessment will determine the need for genetic counseling and BRCA1 and BRCA2 testing. Cervical Cancer Your health care provider may recommend that you be screened regularly for cancer of the pelvic organs (ovaries, uterus, and vagina). This screening involves a pelvic examination, including checking for microscopic changes to the surface of your cervix (Pap test). You may be encouraged to have this screening done every 3 years, beginning at age 44.  For women ages 5-65, health care providers may recommend pelvic exams and Pap testing every 3 years, or they may recommend the Pap and pelvic exam, combined with testing for human papilloma virus (HPV), every 5 years. Some types of HPV increase your risk of cervical cancer. Testing for HPV may also be done on women of any age with unclear Pap  test results.  Other health care providers may not recommend any screening for nonpregnant women who are considered low risk for pelvic cancer and who do not have symptoms. Ask your health care provider if a screening pelvic exam is right for you.  If you have had past treatment for cervical cancer or a condition that could lead to cancer, you need Pap tests and screening for cancer for at least 20 years after your treatment. If Pap tests have been discontinued, your risk factors (such as having a new sexual partner) need to be reassessed to determine if screening should resume. Some women have medical problems that increase the chance of getting cervical cancer. In these cases, your health care provider may recommend more frequent screening and Pap tests. Colorectal Cancer  This type of cancer can be detected and often  prevented.  Routine colorectal cancer screening usually begins at 75 years of age and continues through 75 years of age.  Your health care provider may recommend screening at an earlier age if you have risk factors for colon cancer.  Your health care provider may also recommend using home test kits to check for hidden blood in the stool.  A small camera at the end of a tube can be used to examine your colon directly (sigmoidoscopy or colonoscopy). This is done to check for the earliest forms of colorectal cancer.  Routine screening usually begins at age 34.  Direct examination of the colon should be repeated every 5-10 years through 75 years of age. However, you may need to be screened more often if early forms of precancerous polyps or small growths are found. Skin Cancer  Check your skin from head to toe regularly.  Tell your health care provider about any new moles or changes in moles, especially if there is a change in a mole's shape or color.  Also tell your health care provider if you have a mole that is larger than the size of a pencil eraser.  Always use sunscreen.  Apply sunscreen liberally and repeatedly throughout the day.  Protect yourself by wearing long sleeves, pants, a wide-brimmed hat, and sunglasses whenever you are outside. HEART DISEASE, DIABETES, AND HIGH BLOOD PRESSURE   High blood pressure causes heart disease and increases the risk of stroke. High blood pressure is more likely to develop in:  People who have blood pressure in the high end of the normal range (130-139/85-89 mm Hg).  People who are overweight or obese.  People who are African American.  If you are 57-26 years of age, have your blood pressure checked every 3-5 years. If you are 33 years of age or older, have your blood pressure checked every year. You should have your blood pressure measured twice--once when you are at a hospital or clinic, and once when you are not at a hospital or clinic. Record the average of the two measurements. To check your blood pressure when you are not at a hospital or clinic, you can use:  An automated blood pressure machine at a pharmacy.  A home blood pressure monitor.  If you are between 46 years and 31 years old, ask your health care provider if you should take aspirin to prevent strokes.  Have regular diabetes screenings. This involves taking a blood sample to check your fasting blood sugar level.  If you are at a normal weight and have a low risk for diabetes, have this test once every three years after 75 years of age.  If you are overweight and have a high risk for diabetes, consider being tested at a younger age or more often. PREVENTING INFECTION  Hepatitis B  If you have a higher risk for hepatitis B, you should be screened for this virus. You are considered at high risk for hepatitis B if:  You were born in a country where hepatitis B is common. Ask your health care provider which countries are considered high risk.  Your parents were born in a high-risk country, and you have not been immunized against hepatitis B (hepatitis B  vaccine).  You have HIV or AIDS.  You use needles to inject street drugs.  You live with someone who has hepatitis B.  You have had sex with someone who has hepatitis B.  You get hemodialysis treatment.  You take certain medicines for conditions, including  cancer, organ transplantation, and autoimmune conditions. Hepatitis C  Blood testing is recommended for:  Everyone born from [birth year] through 1966.  Anyone with known risk factors for hepatitis C. Sexually transmitted infections (STIs)  You should be screened for sexually transmitted infections (STIs) including gonorrhea and chlamydia if:  You are sexually active and are younger than 75 years of age.  You are older than 75 years of age and your health care provider tells you that you are at risk for this type of infection.  Your sexual activity has changed since you were last screened and you are at an increased risk for chlamydia or gonorrhea. Ask your health care provider if you are at risk.  If you do not have HIV, but are at risk, it may be recommended that you take a prescription medicine daily to prevent HIV infection. This is called pre-exposure prophylaxis (PrEP). You are considered at risk if:  You are sexually active and do not regularly use condoms or know the HIV status of your partner(s).  You take drugs by injection.  You are sexually active with a partner who has HIV. Talk with your health care provider about whether you are at high risk of being infected with HIV. If you choose to begin PrEP, you should first be tested for HIV. You should then be tested every 3 months for as long as you are taking PrEP.  PREGNANCY   If you are premenopausal and you may become pregnant, ask your health care provider about preconception counseling.  If you may become pregnant, take 400 to 800 micrograms (mcg) of folic acid every day.  If you want to prevent pregnancy, talk to your health care provider about birth control  (contraception). OSTEOPOROSIS AND MENOPAUSE   Osteoporosis is a disease in which the bones lose minerals and strength with aging. This can result in serious bone fractures. Your risk for osteoporosis can be identified using a bone density scan.  If you are 83 years of age or older, or if you are at risk for osteoporosis and fractures, ask your health care provider if you should be screened.  Ask your health care provider whether you should take a calcium or vitamin D supplement to lower your risk for osteoporosis.  Menopause may have certain physical symptoms and risks.  Hormone replacement therapy may reduce some of these symptoms and risks. Talk to your health care provider about whether hormone replacement therapy is right for you.  HOME CARE INSTRUCTIONS   Schedule regular health, dental, and eye exams.  Stay current with your immunizations.   Do not use any tobacco products including cigarettes, chewing tobacco, or electronic cigarettes.  If you are pregnant, do not drink alcohol.  If you are breastfeeding, limit how much and how often you drink alcohol.  Limit alcohol intake to no more than 1 drink per day for nonpregnant women. One drink equals 12 ounces of beer, 5 ounces of wine, or 1 ounces of hard liquor.  Do not use street drugs.  Do not share needles.  Ask your health care provider for help if you need support or information about quitting drugs.  Tell your health care provider if you often feel depressed.  Tell your health care provider if you have ever been abused or do not feel safe at home.   This information is not intended to replace advice given to you by your health care provider. Make sure you discuss any questions you have with your health care  provider.   Document Released: 07/10/2010 Document Revised: 01/15/2014 Document Reviewed: 11/26/2012 Elsevier Interactive Patient Education [employer].

## 2014-12-06 ENCOUNTER — Encounter: Payer: Self-pay | Admitting: Internal Medicine

## 2014-12-06 ENCOUNTER — Other Ambulatory Visit (INDEPENDENT_AMBULATORY_CARE_PROVIDER_SITE_OTHER): Payer: Medicare Other

## 2014-12-06 ENCOUNTER — Ambulatory Visit (INDEPENDENT_AMBULATORY_CARE_PROVIDER_SITE_OTHER): Payer: Medicare Other | Admitting: Internal Medicine

## 2014-12-06 VITALS — BP 150/82 | HR 59 | Wt 210.0 lb

## 2014-12-06 DIAGNOSIS — Z0000 Encounter for general adult medical examination without abnormal findings: Secondary | ICD-10-CM

## 2014-12-06 DIAGNOSIS — I10 Essential (primary) hypertension: Secondary | ICD-10-CM

## 2014-12-06 DIAGNOSIS — E119 Type 2 diabetes mellitus without complications: Secondary | ICD-10-CM | POA: Diagnosis not present

## 2014-12-06 DIAGNOSIS — M06032 Rheumatoid arthritis without rheumatoid factor, left wrist: Secondary | ICD-10-CM

## 2014-12-06 DIAGNOSIS — M06031 Rheumatoid arthritis without rheumatoid factor, right wrist: Secondary | ICD-10-CM | POA: Diagnosis not present

## 2014-12-06 DIAGNOSIS — Z23 Encounter for immunization: Secondary | ICD-10-CM | POA: Diagnosis not present

## 2014-12-06 DIAGNOSIS — K5909 Other constipation: Secondary | ICD-10-CM

## 2014-12-06 LAB — CBC WITH DIFFERENTIAL/PLATELET
BASOS ABS: 0 10*3/uL (ref 0.0–0.1)
Basophils Relative: 0.6 % (ref 0.0–3.0)
EOS ABS: 0.1 10*3/uL (ref 0.0–0.7)
Eosinophils Relative: 2.3 % (ref 0.0–5.0)
HEMATOCRIT: 37.4 % (ref 36.0–46.0)
HEMOGLOBIN: 12.2 g/dL (ref 12.0–15.0)
LYMPHS PCT: 30.6 % (ref 12.0–46.0)
Lymphs Abs: 1.9 10*3/uL (ref 0.7–4.0)
MCHC: 32.5 g/dL (ref 30.0–36.0)
MCV: 89.8 fl (ref 78.0–100.0)
Monocytes Absolute: 0.6 10*3/uL (ref 0.1–1.0)
Monocytes Relative: 9.7 % (ref 3.0–12.0)
NEUTROS ABS: 3.5 10*3/uL (ref 1.4–7.7)
Neutrophils Relative %: 56.8 % (ref 43.0–77.0)
Platelets: 220 10*3/uL (ref 150.0–400.0)
RBC: 4.17 Mil/uL (ref 3.87–5.11)
RDW: 13.8 % (ref 11.5–15.5)
WBC: 6.2 10*3/uL (ref 4.0–10.5)

## 2014-12-06 LAB — URINALYSIS, ROUTINE W REFLEX MICROSCOPIC
Bilirubin Urine: NEGATIVE
Ketones, ur: NEGATIVE
Leukocytes, UA: NEGATIVE
Nitrite: NEGATIVE
PH: 5 (ref 5.0–8.0)
SPECIFIC GRAVITY, URINE: 1.015 (ref 1.000–1.030)
TOTAL PROTEIN, URINE-UPE24: NEGATIVE
Urine Glucose: NEGATIVE
Urobilinogen, UA: 0.2 — AB (ref 0.0–1.0)

## 2014-12-06 LAB — BASIC METABOLIC PANEL
BUN: 14 mg/dL (ref 6–23)
CALCIUM: 9.5 mg/dL (ref 8.4–10.5)
CHLORIDE: 105 meq/L (ref 96–112)
CO2: 28 meq/L (ref 19–32)
CREATININE: 0.87 mg/dL (ref 0.40–1.20)
GFR: 81.57 mL/min (ref >60.00)
Glucose, Bld: 114 mg/dL — ABNORMAL HIGH (ref 70–99)
Potassium: 3.9 mEq/L (ref 3.5–5.1)
SODIUM: 140 meq/L (ref 135–145)

## 2014-12-06 LAB — HEPATIC FUNCTION PANEL
ALBUMIN: 4 g/dL (ref 3.5–5.2)
ALK PHOS: 94 U/L (ref 39–117)
ALT: 16 U/L (ref 0–35)
AST: 27 U/L (ref 0–37)
BILIRUBIN DIRECT: 0.1 mg/dL (ref 0.0–0.3)
TOTAL PROTEIN: 7 g/dL (ref 6.0–8.3)
Total Bilirubin: 0.6 mg/dL (ref 0.2–1.2)

## 2014-12-06 LAB — MICROALBUMIN / CREATININE URINE RATIO
Creatinine,U: 117.3 mg/dL
Microalb Creat Ratio: 1.5 mg/g (ref 0.0–30.0)
Microalb, Ur: 1.8 mg/dL (ref 0.0–1.9)

## 2014-12-06 LAB — HEMOGLOBIN A1C: Hgb A1c MFr Bld: 6.2 % (ref 4.6–6.5)

## 2014-12-06 LAB — TSH: TSH: 1.94 u[IU]/mL (ref 0.35–4.50)

## 2014-12-06 MED ORDER — ASPIRIN 81 MG PO CHEW: 81.0000 mg | CHEWABLE_TABLET | Freq: Every day | ORAL | Status: AC

## 2014-12-06 MED ORDER — LACTULOSE 20 G PO PACK: 20.0000 g | PACK | Freq: Every day | ORAL | Status: DC | PRN

## 2014-12-06 NOTE — Assessment & Plan Note (Signed)

## 2014-12-06 NOTE — Assessment & Plan Note (Signed)
Off [facility]TX since 9/16

## 2014-12-06 NOTE — Patient Instructions (Signed)
Preventive Care for Adults, Female A healthy lifestyle and preventive care can promote health and wellness. Preventive health guidelines for women include the following key practices.  A routine yearly physical is a good way to check with your health care provider about your health and preventive screening. It is a chance to share any concerns and updates on your health and to receive a thorough exam.  Visit your dentist for a routine exam and preventive care every 6 months. Brush your teeth twice a day and floss once a day. Good oral hygiene prevents tooth decay and gum disease.  The frequency of eye exams is based on your age, health, family medical history, use of contact lenses, and other factors. Follow your health care provider's recommendations for frequency of eye exams.  Eat a healthy diet. Foods like vegetables, fruits, whole grains, low-fat dairy products, and lean protein foods contain the nutrients you need without too many calories. Decrease your intake of foods high in solid fats, added sugars, and salt. Eat the right amount of calories for you.Get information about a proper diet from your health care provider, if necessary.  Regular physical exercise is one of the most important things you can do for your health. Most adults should get at least 150 minutes of moderate-intensity exercise (any activity that increases your heart rate and causes you to sweat) each week. In addition, most adults need muscle-strengthening exercises on 2 or more days a week.  Maintain a healthy weight. The body mass index (BMI) is a screening tool to identify possible weight problems. It provides an estimate of body fat based on height and weight. Your health care provider can find your BMI and can help you achieve or maintain a healthy weight.For adults 20 years and older:  A BMI below 18.5 is considered underweight.  A BMI of 18.5 to 24.9 is normal.  A BMI of 25 to 29.9 is considered overweight.  A  BMI of 30 and above is considered obese.  Maintain normal blood lipids and cholesterol levels by exercising and minimizing your intake of saturated fat. Eat a balanced diet with plenty of fruit and vegetables. Blood tests for lipids and cholesterol should begin at age 45 and be repeated every 5 years. If your lipid or cholesterol levels are high, you are over 50, or you are at high risk for heart disease, you may need your cholesterol levels checked more frequently.Ongoing high lipid and cholesterol levels should be treated with medicines if diet and exercise are not working.  If you smoke, find out from your health care provider how to quit. If you do not use tobacco, do not start.  Lung cancer screening is recommended for adults aged 45-80 years who are at high risk for developing lung cancer because of a history of smoking. A yearly low-dose CT scan of the lungs is recommended for people who have at least a 30-pack-year history of smoking and are a current smoker or have quit within the past 15 years. A pack year of smoking is smoking an average of 1 pack of cigarettes a day for 1 year (for example: 1 pack a day for 30 years or 2 packs a day for 15 years). Yearly screening should continue until the smoker has stopped smoking for at least 15 years. Yearly screening should be stopped for people who develop a health problem that would prevent them from having lung cancer treatment.  If you are pregnant, do not drink alcohol. If you are  breastfeeding, be very cautious about drinking alcohol. If you are not pregnant and choose to drink alcohol, do not have more than 1 drink per day. One drink is considered to be 12 ounces (355 mL) of beer, 5 ounces (148 mL) of wine, or 1.5 ounces (44 mL) of liquor.  Avoid use of street drugs. Do not share needles with anyone. Ask for help if you need support or instructions about stopping the use of drugs.  High blood pressure causes heart disease and increases the risk  of stroke. Your blood pressure should be checked at least every 1 to 2 years. Ongoing high blood pressure should be treated with medicines if weight loss and exercise do not work.  If you are 55-79 years old, ask your health care provider if you should take aspirin to prevent strokes.  Diabetes screening is done by taking a blood sample to check your blood glucose level after you have not eaten for a certain period of time (fasting). If you are not overweight and you do not have risk factors for diabetes, you should be screened once every 3 years starting at age 45. If you are overweight or obese and you are 40-70 years of age, you should be screened for diabetes every year as part of your cardiovascular risk assessment.  Breast cancer screening is essential preventive care for women. You should practice "breast self-awareness." This means understanding the normal appearance and feel of your breasts and may include breast self-examination. Any changes detected, no matter how small, should be reported to a health care provider. Women in their 20s and 30s should have a clinical breast exam (CBE) by a health care provider as part of a regular health exam every 1 to 3 years. After age 40, women should have a CBE every year. Starting at age 40, women should consider having a mammogram (breast X-ray test) every year. Women who have a family history of breast cancer should talk to their health care provider about genetic screening. Women at a high risk of breast cancer should talk to their health care providers about having an MRI and a mammogram every year.  Breast cancer gene (BRCA)-related cancer risk assessment is recommended for women who have family members with BRCA-related cancers. BRCA-related cancers include breast, ovarian, tubal, and peritoneal cancers. Having family members with these cancers may be associated with an increased risk for harmful changes (mutations) in the breast cancer genes BRCA1 and  BRCA2. Results of the assessment will determine the need for genetic counseling and BRCA1 and BRCA2 testing.  Your health care provider may recommend that you be screened regularly for cancer of the pelvic organs (ovaries, uterus, and vagina). This screening involves a pelvic examination, including checking for microscopic changes to the surface of your cervix (Pap test). You may be encouraged to have this screening done every 3 years, beginning at age 21.  For women ages 30-65, health care providers may recommend pelvic exams and Pap testing every 3 years, or they may recommend the Pap and pelvic exam, combined with testing for human papilloma virus (HPV), every 5 years. Some types of HPV increase your risk of cervical cancer. Testing for HPV may also be done on women of any age with unclear Pap test results.  Other health care providers may not recommend any screening for nonpregnant women who are considered low risk for pelvic cancer and who do not have symptoms. Ask your health care provider if a screening pelvic exam is right for   you.  If you have had past treatment for cervical cancer or a condition that could lead to cancer, you need Pap tests and screening for cancer for at least 20 years after your treatment. If Pap tests have been discontinued, your risk factors (such as having a new sexual partner) need to be reassessed to determine if screening should resume. Some women have medical problems that increase the chance of getting cervical cancer. In these cases, your health care provider may recommend more frequent screening and Pap tests.  Colorectal cancer can be detected and often prevented. Most routine colorectal cancer screening begins at the age of 50 years and continues through age 75 years. However, your health care provider may recommend screening at an earlier age if you have risk factors for colon cancer. On a yearly basis, your health care provider may provide home test kits to check  for hidden blood in the stool. Use of a small camera at the end of a tube, to directly examine the colon (sigmoidoscopy or colonoscopy), can detect the earliest forms of colorectal cancer. Talk to your health care provider about this at age 50, when routine screening begins. Direct exam of the colon should be repeated every 5-10 years through age 75 years, unless early forms of precancerous polyps or small growths are found.  People who are at an increased risk for hepatitis B should be screened for this virus. You are considered at high risk for hepatitis B if:  You were born in a country where hepatitis B occurs often. Talk with your health care provider about which countries are considered high risk.  Your parents were born in a high-risk country and you have not received a shot to protect against hepatitis B (hepatitis B vaccine).  You have HIV or AIDS.  You use needles to inject street drugs.  You live with, or have sex with, someone who has hepatitis B.  You get hemodialysis treatment.  You take certain medicines for conditions like cancer, organ transplantation, and autoimmune conditions.  Hepatitis C blood testing is recommended for all people born from 1945 through 1965 and any individual with known risks for hepatitis C.  Practice safe sex. Use condoms and avoid high-risk sexual practices to reduce the spread of sexually transmitted infections (STIs). STIs include gonorrhea, chlamydia, syphilis, trichomonas, herpes, HPV, and human immunodeficiency virus (HIV). Herpes, HIV, and HPV are viral illnesses that have no cure. They can result in disability, cancer, and death.  You should be screened for sexually transmitted illnesses (STIs) including gonorrhea and chlamydia if:  You are sexually active and are younger than 24 years.  You are older than 24 years and your health care provider tells you that you are at risk for this type of infection.  Your sexual activity has changed  since you were last screened and you are at an increased risk for chlamydia or gonorrhea. Ask your health care provider if you are at risk.  If you are at risk of being infected with HIV, it is recommended that you take a prescription medicine daily to prevent HIV infection. This is called preexposure prophylaxis (PrEP). You are considered at risk if:  You are sexually active and do not regularly use condoms or know the HIV status of your partner(s).  You take drugs by injection.  You are sexually active with a partner who has HIV.  Talk with your health care provider about whether you are at high risk of being infected with HIV. If   you choose to begin PrEP, you should first be tested for HIV. You should then be tested every 3 months for as long as you are taking PrEP.  Osteoporosis is a disease in which the bones lose minerals and strength with aging. This can result in serious bone fractures or breaks. The risk of osteoporosis can be identified using a bone density scan. Women ages 67 years and over and women at risk for fractures or osteoporosis should discuss screening with their health care providers. Ask your health care provider whether you should take a calcium supplement or vitamin D to reduce the rate of osteoporosis.  Menopause can be associated with physical symptoms and risks. Hormone replacement therapy is available to decrease symptoms and risks. You should talk to your health care provider about whether hormone replacement therapy is right for you.  Use sunscreen. Apply sunscreen liberally and repeatedly throughout the day. You should seek shade when your shadow is shorter than you. Protect yourself by wearing long sleeves, pants, a wide-brimmed hat, and sunglasses year round, whenever you are outdoors.  Once a month, do a whole body skin exam, using a mirror to look at the skin on your back. Tell your health care provider of new moles, moles that have irregular borders, moles that  are larger than a pencil eraser, or moles that have changed in shape or color.  Stay current with required vaccines (immunizations).  Influenza vaccine. All adults should be immunized every year.  Tetanus, diphtheria, and acellular pertussis (Td, Tdap) vaccine. Pregnant women should receive 1 dose of Tdap vaccine during each pregnancy. The dose should be obtained regardless of the length of time since the last dose. Immunization is preferred during the 27th-36th week of gestation. An adult who has not previously received Tdap or who does not know her vaccine status should receive 1 dose of Tdap. This initial dose should be followed by tetanus and diphtheria toxoids (Td) booster doses every 10 years. Adults with an unknown or incomplete history of completing a 3-dose immunization series with Td-containing vaccines should begin or complete a primary immunization series including a Tdap dose. Adults should receive a Td booster every 10 years.  Varicella vaccine. An adult without evidence of immunity to varicella should receive 2 doses or a second dose if she has previously received 1 dose. Pregnant females who do not have evidence of immunity should receive the first dose after pregnancy. This first dose should be obtained before leaving the health care facility. The second dose should be obtained 4-8 weeks after the first dose.  Human papillomavirus (HPV) vaccine. Females aged 13-26 years who have not received the vaccine previously should obtain the 3-dose series. The vaccine is not recommended for use in pregnant females. However, pregnancy testing is not needed before receiving a dose. If a female is found to be pregnant after receiving a dose, no treatment is needed. In that case, the remaining doses should be delayed until after the pregnancy. Immunization is recommended for any person with an immunocompromised condition through the age of 61 years if she did not get any or all doses earlier. During the  3-dose series, the second dose should be obtained 4-8 weeks after the first dose. The third dose should be obtained 24 weeks after the first dose and 16 weeks after the second dose.  Zoster vaccine. One dose is recommended for adults aged 30 years or older unless certain conditions are present.  Measles, mumps, and rubella (MMR) vaccine. Adults born  before 1957 generally are considered immune to measles and mumps. Adults born in 1957 or later should have 1 or more doses of MMR vaccine unless there is a contraindication to the vaccine or there is laboratory evidence of immunity to each of the three diseases. A routine second dose of MMR vaccine should be obtained at least 28 days after the first dose for students attending postsecondary schools, health care workers, or international travelers. People who received inactivated measles vaccine or an unknown type of measles vaccine during 1963-1967 should receive 2 doses of MMR vaccine. People who received inactivated mumps vaccine or an unknown type of mumps vaccine before 1979 and are at high risk for mumps infection should consider immunization with 2 doses of MMR vaccine. For females of childbearing age, rubella immunity should be determined. If there is no evidence of immunity, females who are not pregnant should be vaccinated. If there is no evidence of immunity, females who are pregnant should delay immunization until after pregnancy. Unvaccinated health care workers born before 1957 who lack laboratory evidence of measles, mumps, or rubella immunity or laboratory confirmation of disease should consider measles and mumps immunization with 2 doses of MMR vaccine or rubella immunization with 1 dose of MMR vaccine.  Pneumococcal 13-valent conjugate (PCV13) vaccine. When indicated, a person who is uncertain of his immunization history and has no record of immunization should receive the PCV13 vaccine. All adults 65 years of age and older should receive this  vaccine. An adult aged 19 years or older who has certain medical conditions and has not been previously immunized should receive 1 dose of PCV13 vaccine. This PCV13 should be followed with a dose of pneumococcal polysaccharide (PPSV23) vaccine. Adults who are at high risk for pneumococcal disease should obtain the PPSV23 vaccine at least 8 weeks after the dose of PCV13 vaccine. Adults older than 75 years of age who have normal immune system function should obtain the PPSV23 vaccine dose at least 1 year after the dose of PCV13 vaccine.  Pneumococcal polysaccharide (PPSV23) vaccine. When PCV13 is also indicated, PCV13 should be obtained first. All adults aged 65 years and older should be immunized. An adult younger than age 65 years who has certain medical conditions should be immunized. Any person who resides in a nursing home or long-term care facility should be immunized. An adult smoker should be immunized. People with an immunocompromised condition and certain other conditions should receive both PCV13 and PPSV23 vaccines. People with human immunodeficiency virus (HIV) infection should be immunized as soon as possible after diagnosis. Immunization during chemotherapy or radiation therapy should be avoided. Routine use of PPSV23 vaccine is not recommended for American Indians, [place] Natives, or people younger than 65 years unless there are medical conditions that require PPSV23 vaccine. When indicated, people who have unknown immunization and have no record of immunization should receive PPSV23 vaccine. One-time revaccination 5 years after the first dose of PPSV23 is recommended for people aged 19-64 years who have chronic kidney failure, nephrotic syndrome, asplenia, or immunocompromised conditions. People who received 1-2 doses of PPSV23 before age 65 years should receive another dose of PPSV23 vaccine at age 65 years or later if at least 5 years have passed since the previous dose. Doses of PPSV23 are not  needed for people immunized with PPSV23 at or after age 65 years.  Meningococcal vaccine. Adults with asplenia or persistent complement component deficiencies should receive 2 doses of quadrivalent meningococcal conjugate (MenACWY-D) vaccine. The doses should be obtained   at least 2 months apart. Microbiologists working with certain meningococcal bacteria, [place] recruits, people at risk during an outbreak, and people who travel to or live in countries with a high rate of meningitis should be immunized. A first-year college student up through age 34 years who is living in a residence hall should receive a dose if she did not receive a dose on or after her 16th birthday. Adults who have certain high-risk conditions should receive one or more doses of vaccine.  Hepatitis A vaccine. Adults who wish to be protected from this disease, have certain high-risk conditions, work with hepatitis A-infected animals, work in hepatitis A research labs, or travel to or work in countries with a high rate of hepatitis A should be immunized. Adults who were previously unvaccinated and who anticipate close contact with an international adoptee during the first 60 days after arrival in the [place] from a country with a high rate of hepatitis A should be immunized.  Hepatitis B vaccine. Adults who wish to be protected from this disease, have certain high-risk conditions, may be exposed to blood or other infectious body fluids, are household contacts or sex partners of hepatitis B positive people, are clients or workers in certain care facilities, or travel to or work in countries with a high rate of hepatitis B should be immunized.  Haemophilus influenzae type b (Hib) vaccine. A previously unvaccinated person with asplenia or sickle cell disease or having a scheduled splenectomy should receive 1 dose of Hib vaccine. Regardless of previous immunization, a recipient of a hematopoietic stem cell transplant should receive a  3-dose series 6-12 months after her successful transplant. Hib vaccine is not recommended for adults with HIV infection. Preventive Services / Frequency Ages 35 to 4 years  Blood pressure check.** / Every 3-5 years.  Lipid and cholesterol check.** / Every 5 years beginning at age 60.  Clinical breast exam.** / Every 3 years for women in their 71s and 10s.  BRCA-related cancer risk assessment.** / For women who have family members with a BRCA-related cancer (breast, ovarian, tubal, or peritoneal cancers).  Pap test.** / Every 2 years from ages 76 through 26. Every 3 years starting at age 61 through age 76 or 93 with a history of 3 consecutive normal Pap tests.  HPV screening.** / Every 3 years from ages 37 through ages 60 to 51 with a history of 3 consecutive normal Pap tests.  Hepatitis C blood test.** / For any individual with known risks for hepatitis C.  Skin self-exam. / Monthly.  Influenza vaccine. / Every year.  Tetanus, diphtheria, and acellular pertussis (Tdap, Td) vaccine.** / Consult your health care provider. Pregnant women should receive 1 dose of Tdap vaccine during each pregnancy. 1 dose of Td every 10 years.  Varicella vaccine.** / Consult your health care provider. Pregnant females who do not have evidence of immunity should receive the first dose after pregnancy.  HPV vaccine. / 3 doses over 6 months, if 93 and younger. The vaccine is not recommended for use in pregnant females. However, pregnancy testing is not needed before receiving a dose.  Measles, mumps, rubella (MMR) vaccine.** / You need at least 1 dose of MMR if you were born in 1957 or later. You may also need a 2nd dose. For females of childbearing age, rubella immunity should be determined. If there is no evidence of immunity, females who are not pregnant should be vaccinated. If there is no evidence of immunity, females who are  pregnant should delay immunization until after pregnancy.  Pneumococcal  13-valent conjugate (PCV13) vaccine.** / Consult your health care provider.  Pneumococcal polysaccharide (PPSV23) vaccine.** / 1 to 2 doses if you smoke cigarettes or if you have certain conditions.  Meningococcal vaccine.** / 1 dose if you are age 68 to 8 years and a Market researcher living in a residence hall, or have one of several medical conditions, you need to get vaccinated against meningococcal disease. You may also need additional booster doses.  Hepatitis A vaccine.** / Consult your health care provider.  Hepatitis B vaccine.** / Consult your health care provider.  Haemophilus influenzae type b (Hib) vaccine.** / Consult your health care provider. Ages 7 to 53 years  Blood pressure check.** / Every year.  Lipid and cholesterol check.** / Every 5 years beginning at age 25 years.  Lung cancer screening. / Every year if you are aged 11-80 years and have a 30-pack-year history of smoking and currently smoke or have quit within the past 15 years. Yearly screening is stopped once you have quit smoking for at least 15 years or develop a health problem that would prevent you from having lung cancer treatment.  Clinical breast exam.** / Every year after age 48 years.  BRCA-related cancer risk assessment.** / For women who have family members with a BRCA-related cancer (breast, ovarian, tubal, or peritoneal cancers).  Mammogram.** / Every year beginning at age 41 years and continuing for as long as you are in good health. Consult with your health care provider.  Pap test.** / Every 3 years starting at age 65 years through age 37 or 70 years with a history of 3 consecutive normal Pap tests.  HPV screening.** / Every 3 years from ages 72 years through ages 60 to 40 years with a history of 3 consecutive normal Pap tests.  Fecal occult blood test (FOBT) of stool. / Every year beginning at age 21 years and continuing until age 5 years. You may not need to do this test if you get  a colonoscopy every 10 years.  Flexible sigmoidoscopy or colonoscopy.** / Every 5 years for a flexible sigmoidoscopy or every 10 years for a colonoscopy beginning at age 35 years and continuing until age 48 years.  Hepatitis C blood test.** / For all people born from [birth year] through 1965 and any individual with known risks for hepatitis C.  Skin self-exam. / Monthly.  Influenza vaccine. / Every year.  Tetanus, diphtheria, and acellular pertussis (Tdap/Td) vaccine.** / Consult your health care provider. Pregnant women should receive 1 dose of Tdap vaccine during each pregnancy. 1 dose of Td every 10 years.  Varicella vaccine.** / Consult your health care provider. Pregnant females who do not have evidence of immunity should receive the first dose after pregnancy.  Zoster vaccine.** / 1 dose for adults aged 30 years or older.  Measles, mumps, rubella (MMR) vaccine.** / You need at least 1 dose of MMR if you were born in 1957 or later. You may also need a second dose. For females of childbearing age, rubella immunity should be determined. If there is no evidence of immunity, females who are not pregnant should be vaccinated. If there is no evidence of immunity, females who are pregnant should delay immunization until after pregnancy.  Pneumococcal 13-valent conjugate (PCV13) vaccine.** / Consult your health care provider.  Pneumococcal polysaccharide (PPSV23) vaccine.** / 1 to 2 doses if you smoke cigarettes or if you have certain conditions.  Meningococcal vaccine.** /  Consult your health care provider.  Hepatitis A vaccine.** / Consult your health care provider.  Hepatitis B vaccine.** / Consult your health care provider.  Haemophilus influenzae type b (Hib) vaccine.** / Consult your health care provider. Ages 64 years and over  Blood pressure check.** / Every year.  Lipid and cholesterol check.** / Every 5 years beginning at age 23 years.  Lung cancer screening. / Every year if you  are aged 16-80 years and have a 30-pack-year history of smoking and currently smoke or have quit within the past 15 years. Yearly screening is stopped once you have quit smoking for at least 15 years or develop a health problem that would prevent you from having lung cancer treatment.  Clinical breast exam.** / Every year after age 74 years.  BRCA-related cancer risk assessment.** / For women who have family members with a BRCA-related cancer (breast, ovarian, tubal, or peritoneal cancers).  Mammogram.** / Every year beginning at age 44 years and continuing for as long as you are in good health. Consult with your health care provider.  Pap test.** / Every 3 years starting at age 58 years through age 22 or 39 years with 3 consecutive normal Pap tests. Testing can be stopped between 65 and 70 years with 3 consecutive normal Pap tests and no abnormal Pap or HPV tests in the past 10 years.  HPV screening.** / Every 3 years from ages 64 years through ages 70 or 61 years with a history of 3 consecutive normal Pap tests. Testing can be stopped between 65 and 70 years with 3 consecutive normal Pap tests and no abnormal Pap or HPV tests in the past 10 years.  Fecal occult blood test (FOBT) of stool. / Every year beginning at age 40 years and continuing until age 27 years. You may not need to do this test if you get a colonoscopy every 10 years.  Flexible sigmoidoscopy or colonoscopy.** / Every 5 years for a flexible sigmoidoscopy or every 10 years for a colonoscopy beginning at age 7 years and continuing until age 32 years.  Hepatitis C blood test.** / For all people born from [birth year] through 1966 and any individual with known risks for hepatitis C.  Osteoporosis screening.** / A one-time screening for women ages 30 years and over and women at risk for fractures or osteoporosis.  Skin self-exam. / Monthly.  Influenza vaccine. / Every year.  Tetanus, diphtheria, and acellular pertussis (Tdap/Td)  vaccine.** / 1 dose of Td every 10 years.  Varicella vaccine.** / Consult your health care provider.  Zoster vaccine.** / 1 dose for adults aged 35 years or older.  Pneumococcal 13-valent conjugate (PCV13) vaccine.** / Consult your health care provider.  Pneumococcal polysaccharide (PPSV23) vaccine.** / 1 dose for all adults aged 46 years and older.  Meningococcal vaccine.** / Consult your health care provider.  Hepatitis A vaccine.** / Consult your health care provider.  Hepatitis B vaccine.** / Consult your health care provider.  Haemophilus influenzae type b (Hib) vaccine.** / Consult your health care provider. ** Family history and personal history of risk and conditions may change your health care provider's recommendations.   This information is not intended to replace advice given to you by your health care provider. Make sure you discuss any questions you have with your health care provider.   Document Released: 02/20/2001 Document Revised: 01/15/2014 Document Reviewed: 05/22/2010 Elsevier Interactive Patient Education [employer].

## 2014-12-06 NOTE — Assessment & Plan Note (Signed)
  On diet  

## 2014-12-06 NOTE — Assessment & Plan Note (Signed)
Pt is worried about colon ca. Colonoscopy is not due yet. I gave her an option of doing a Cologuard test - she will think about it

## 2014-12-06 NOTE — Progress Notes (Signed)
Subjective:  Patient ID: Deanna Salazar, female    DOB: [birth year]  Age: 74 y.o. MRN: [id number]  CC: No chief complaint on file.   HPI [employer] presents for a well exam. C/o abd bloating and constipation - Linzess is too $$. Laxatives OTC do not work. Pt is worried of colon ca.  Outpatient Prescriptions Prior to Visit  Medication Sig Dispense Refill  . acetaminophen (TYLENOL) 325 MG tablet Take 650 mg by mouth as needed for headache.     Marland Kitchen amLODipine (NORVASC) 5 MG tablet TAKE 1 TABLET BY MOUTH EVERY DAY 90 tablet 3  . carvedilol (COREG) 12.5 MG tablet Take 1 tablet (12.5 mg total) by mouth 2 (two) times daily with a meal. 180 tablet 3  . Cholecalciferol (VITAMIN D3) 1000 UNITS CAPS Take 1,000 Units by mouth daily.     . furosemide (LASIX) 20 MG tablet Take 1 tablet (20 mg total) by mouth daily. 90 tablet 3  . KLOR-CON M10 10 MEQ tablet TAKE 1 TABLET BY MOUTH EVERY DAY 90 tablet 3  . levothyroxine (SYNTHROID, LEVOTHROID) 50 MCG tablet TAKE 1 TABLET BY MOUTH EVERY DAY 90 tablet 2  . losartan (COZAAR) 100 MG tablet Take 100 mg by mouth daily.    . meclizine (ANTIVERT) 25 MG tablet Take 1 tablet (25 mg total) by mouth 3 (three) times daily as needed for dizziness or nausea. 30 tablet 0  . pantoprazole (PROTONIX) 40 MG tablet Take 1 tablet (40 mg total) by mouth 2 (two) times daily. 99991111 tablet 3  . folic acid (FOLVITE) 1 MG tablet Take 3 mg by mouth daily.     . Linaclotide (LINZESS) 145 MCG CAPS capsule Take 1 capsule (145 mcg total) by mouth daily. (Patient not taking: Reported on 12/06/2014) 30 capsule 1  . methotrexate (RHEUMATREX) 2.5 MG tablet Take 10 mg by mouth once a week. Caution:Chemotherapy. Protect from light. On momdays     No facility-administered medications prior to visit.    ROS Review of Systems  Constitutional: Negative for chills, activity change, appetite change, fatigue and unexpected weight change.  HENT: Negative for congestion, mouth sores and sinus  pressure.   Eyes: Negative for visual disturbance.  Respiratory: Negative for cough and chest tightness.   Gastrointestinal: Positive for constipation and abdominal distention. Negative for nausea, abdominal pain and diarrhea.  Genitourinary: Negative for frequency, difficulty urinating and vaginal pain.  Musculoskeletal: Negative for back pain and gait problem.  Skin: Negative for pallor and rash.  Neurological: Negative for dizziness, tremors, weakness, numbness and headaches.  Psychiatric/Behavioral: Negative for confusion and sleep disturbance.    Objective:  BP 150/82 mmHg  Pulse 59  Wt 210 lb (95.255 kg)  SpO2 97%  BP Readings from Last 3 Encounters:  12/06/14 150/82  11/17/14 138/80  09/14/14 130/70    Wt Readings from Last 3 Encounters:  12/06/14 210 lb (95.255 kg)  11/17/14 211 lb 8 oz (95.936 kg)  09/14/14 210 lb (95.255 kg)    Physical Exam  Constitutional: She appears well-developed. No distress.  HENT:  Head: Normocephalic.  Right Ear: External ear normal.  Left Ear: External ear normal.  Nose: Nose normal.  Mouth/Throat: Oropharynx is clear and moist.  Eyes: Conjunctivae are normal. Pupils are equal, round, and reactive to light. Right eye exhibits no discharge. Left eye exhibits no discharge.  Neck: Normal range of motion. Neck supple. No JVD present. No tracheal deviation present. No thyromegaly present.  Cardiovascular: Normal rate, regular rhythm  and normal heart sounds.   Pulmonary/Chest: No stridor. No respiratory distress. She has no wheezes.  Abdominal: Soft. Bowel sounds are normal. She exhibits no distension and no mass. There is no tenderness. There is no rebound and no guarding.  Musculoskeletal: She exhibits no edema or tenderness.  Lymphadenopathy:    She has no cervical adenopathy.  Neurological: She displays normal reflexes. No cranial nerve deficit. She exhibits normal muscle tone. Coordination normal.  Skin: No rash noted. No erythema.    Psychiatric: She has a normal mood and affect. Her behavior is normal. Judgment and thought content normal.    Lab Results  Component Value Date   WBC 6.2 12/06/2014   HGB 12.2 12/06/2014   HCT 37.4 12/06/2014   PLT 220.0 12/06/2014   GLUCOSE 114* 12/06/2014   CHOL 198 07/14/2013   TRIG 82.0 07/14/2013   HDL 46.50 07/14/2013   LDLDIRECT 180.9 07/26/2009   LDLCALC 135* 07/14/2013   ALT 16 12/06/2014   AST 27 12/06/2014   NA 140 12/06/2014   K 3.9 12/06/2014   CL 105 12/06/2014   CREATININE 0.87 12/06/2014   BUN 14 12/06/2014   CO2 28 12/06/2014   TSH 1.94 12/06/2014   HGBA1C 6.2 12/06/2014   MICROALBUR 1.8 12/06/2014    Dg Esophagus  02/26/2014  CLINICAL DATA:  Dysphagia. EXAM: ESOPHOGRAM/BARIUM SWALLOW TECHNIQUE: Combined double contrast and single contrast examination performed using effervescent crystals, thick barium liquid, and thin barium liquid. FLUOROSCOPY TIME:  1 minutes and 46 seconds COMPARISON:  None. FINDINGS: Initial barium swallows demonstrate normal pharyngeal motion with swallowing. No laryngeal penetration or aspiration. No upper esophageal webs, strictures or diverticuli. The piriform sinus and vallecular air spaces appear normal. Moderate to advanced degenerative changes involving the cervical spine. Anterior spurring most notable at C5-6. Normal esophageal motility. Occasional proximal escape and occasional tertiary contractions. There is a small sliding-type hiatal hernia with inducible GE reflux. No esophageal stricture or mass. The 13 mm barium pill passed into the stomach without difficulty. IMPRESSION: Small sliding-type hiatal hernia and inducible GE reflux. No esophageal stricture or mass. Electronically Signed   By: Marijo Sanes M.D.   On: 02/26/2014 13:43    Assessment & Plan:   Diagnoses and all orders for this visit:  Well adult exam  CONSTIPATION, CHRONIC -     Basic metabolic panel; Future -     CBC with Differential/Platelet; Future -      Hepatic function panel; Future -     Hemoglobin A1c; Future -     Microalbumin / creatinine urine ratio; Future -     TSH; Future -     Urinalysis; Future  Controlled type 2 diabetes mellitus without complication, without long-term current use of insulin (HCC) -     Basic metabolic panel; Future -     CBC with Differential/Platelet; Future -     Hepatic function panel; Future -     Hemoglobin A1c; Future -     Microalbumin / creatinine urine ratio; Future -     TSH; Future -     Urinalysis; Future  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) -     Basic metabolic panel; Future -     CBC with Differential/Platelet; Future -     Hepatic function panel; Future -     Hemoglobin A1c; Future -     Microalbumin / creatinine urine ratio; Future -     TSH; Future -  Urinalysis; Future  Essential hypertension  Need for influenza vaccination -     Flu vaccine HIGH DOSE PF  Need for prophylactic vaccination against Streptococcus pneumoniae (pneumococcus) -     Pneumococcal polysaccharide vaccine 23-valent greater than or equal to 2yo subcutaneous/IM  Other orders -     lactulose (CEPHULAC) 20 G packet; Take 1-2 packets (20-40 g total) by mouth daily as needed. -     aspirin (ASPIRIN CHILDRENS) 81 MG chewable tablet; Chew 1 tablet (81 mg total) by mouth daily.   I am having Ms. Belote start on lactulose and aspirin. I am also having her maintain her acetaminophen, Vitamin D3, methotrexate, folic acid, carvedilol, losartan, furosemide, pantoprazole, levothyroxine, KLOR-CON M10, meclizine, Linaclotide, and amLODipine.  Meds ordered this encounter  Medications  . lactulose (CEPHULAC) 20 G packet    Sig: Take 1-2 packets (20-40 g total) by mouth daily as needed.    Dispense:  60 each    Refill:  5  . aspirin (ASPIRIN CHILDRENS) 81 MG chewable tablet    Sig: Chew 1 tablet (81 mg total) by mouth daily.    Dispense:  36 tablet    Refill:  11      Follow-up: Return in about 4 months (around 04/05/2015) for a follow-up visit.  Walker Kehr, MD

## 2014-12-06 NOTE — Progress Notes (Signed)
Pre visit review using our clinic review tool, if applicable. No additional management support is needed unless otherwise documented below in the visit note. 

## 2014-12-06 NOTE — Assessment & Plan Note (Signed)
Chronic Losartan, amlodipine, Coreg Nl BP at home  

## 2014-12-08 ENCOUNTER — Other Ambulatory Visit: Payer: Self-pay

## 2014-12-08 DIAGNOSIS — Z1231 Encounter for screening mammogram for malignant neoplasm of breast: Secondary | ICD-10-CM

## 2014-12-23 ENCOUNTER — Encounter: Payer: Self-pay | Admitting: Gynecology

## 2014-12-24 ENCOUNTER — Encounter: Payer: Self-pay | Admitting: Gynecology

## 2014-12-24 ENCOUNTER — Ambulatory Visit (INDEPENDENT_AMBULATORY_CARE_PROVIDER_SITE_OTHER): Payer: Medicare Other | Admitting: Gynecology

## 2014-12-24 VITALS — BP 124/80 | Ht 63.0 in | Wt 209.0 lb

## 2014-12-24 DIAGNOSIS — C50912 Malignant neoplasm of unspecified site of left female breast: Secondary | ICD-10-CM

## 2014-12-24 DIAGNOSIS — N952 Postmenopausal atrophic vaginitis: Secondary | ICD-10-CM | POA: Diagnosis not present

## 2014-12-24 DIAGNOSIS — Z01419 Encounter for gynecological examination (general) (routine) without abnormal findings: Secondary | ICD-10-CM | POA: Diagnosis not present

## 2014-12-24 NOTE — Progress Notes (Signed)
Deanna Salazar 05/25/1940 [id number]        74 y.o.  G5P5 for breast and pelvic exam.  Past medical history,surgical history, problem list, medications, allergies, family history and social history were all reviewed and documented as reviewed in the EPIC chart.  ROS:  Performed with pertinent positives and negatives included in the history, assessment and plan.   Additional significant findings :  none   Exam: Kim Counsellor Vitals:   12/24/14 1125  BP: 124/80  Height: 5\' 3"  (1.6 m)  Weight: 209 lb (94.802 kg)   General appearance:  Normal affect, orientation and appearance. Skin: Grossly normal HEENT: Without gross lesions.  No cervical or supraclavicular adenopathy. Thyroid normal.  Lungs:  Clear without wheezing, rales or rhonchi Cardiac: RR, without RMG Abdominal:  Soft, nontender, without masses, guarding, rebound, organomegaly or hernia Breasts:  Examined lying and sitting without masses, retractions, discharge or axillary adenopathy.  Well-healed left lumpectomy scar. Pelvic:  Ext/BUS/vagina with atrophic changes  Cervix with atrophic changes  Uterus anteverted, normal size, shape and contour, midline and mobile nontender   Adnexa  Without masses or tenderness    Anus and perineum  Normal   Rectovaginal  Normal sphincter tone without palpated masses or tenderness.    Assessment/Plan:  74 y.o. G26P5 female for annual exam.   1. Postmenopausal/atrophic genital changes. Patient without significant hot flushes, night sweats, vaginal dryness or any vaginal bleeding. Continue monitor and report any issues or vaginal bleeding. 2. History of left breast cancer. Exam NED. Has mammography scheduled in January. SBE monthly reviewed. 3. Pap smear/HPV negative 2015. No Pap smear done today.  History ascus with negative high-risk HPV 2013. Plan repeat at 3-5 year interval. 4. Colonoscopy 2014. Repeat at their recommended interval. 5. DEXA 2015 normal. Repeat at 5 year  interval. 6. Health maintenance. No routine blood work done as this is done at her primary physician's office. Follow up 1 year, sooner as needed.   Anastasio Auerbach MD, 11:43 AM 12/24/2014

## 2014-12-24 NOTE — Patient Instructions (Signed)

## 2014-12-29 ENCOUNTER — Other Ambulatory Visit: Payer: Self-pay | Admitting: *Deleted

## 2014-12-29 MED ORDER — CARVEDILOL 12.5 MG PO TABS: 12.5000 mg | ORAL_TABLET | Freq: Two times a day (BID) | ORAL | Status: DC

## 2014-12-31 ENCOUNTER — Other Ambulatory Visit: Payer: Self-pay | Admitting: Internal Medicine

## 2015-01-09 ENCOUNTER — Ambulatory Visit (INDEPENDENT_AMBULATORY_CARE_PROVIDER_SITE_OTHER): Payer: Medicare Other | Admitting: Emergency Medicine

## 2015-01-09 VITALS — BP 132/86 | HR 64 | Temp 98.4°F | Resp 16 | Ht 63.75 in | Wt 210.4 lb

## 2015-01-09 DIAGNOSIS — J209 Acute bronchitis, unspecified: Secondary | ICD-10-CM

## 2015-01-09 DIAGNOSIS — J0140 Acute pansinusitis, unspecified: Secondary | ICD-10-CM

## 2015-01-09 MED ORDER — HYDROCOD POLST-CPM POLST ER 10-8 MG/5ML PO SUER: 5.0000 mL | Freq: Two times a day (BID) | ORAL | Status: DC

## 2015-01-09 MED ORDER — PSEUDOEPHEDRINE-GUAIFENESIN ER 60-600 MG PO TB12: 1.0000 | ORAL_TABLET | Freq: Two times a day (BID) | ORAL | Status: DC

## 2015-01-09 MED ORDER — AMOXICILLIN-POT CLAVULANATE 875-125 MG PO TABS: 1.0000 | ORAL_TABLET | Freq: Two times a day (BID) | ORAL | Status: DC

## 2015-01-09 NOTE — Progress Notes (Signed)
Subjective:  Patient ID: Deanna Salazar, female    DOB: 03-20-1940  Age: 75 y.o. MRN: [id number]  CC: Cough and Sore Throat   HPI Deanna Salazar presents   Patient has nasal congestion nasal discharge postnasal drainage and sore throat. She has pressure around her cheeks and forehead. She has a fever by history. She has a cough productive purulent sputum with wheezing and some shortness of breath. She has no nausea vomiting or stool change. She has shortness of breath with exertion. No improvement with over-the-counter medication  History Deanna Salazar has a past medical history of Abdominal pain, generalized (02/05/2008); ABDOMINAL PAIN-EPIGASTRIC (03/24/2009); Acute bronchitis (02/15/2007); BACK PAIN (07/20/2008); BREAST CANCER, HX OF (04/16/2007); CHEST PAIN (02/23/2010); COLONIC POLYPS, ADENOMATOUS, HX OF (10/01/2007); CONSTIPATION, CHRONIC (05/10/2007); Dysuria (04/06/2008); FATIGUE (12/26/2006); FATTY LIVER DISEASE (11/19/2006); FIBROMYALGIA (11/19/2006); HEMATOCHEZIA (05/10/2007); HEMORRHOIDS, INTERNAL (05/10/2007); HIP PAIN (07/20/2008); HYPERLIPIDEMIA (07/26/2009); HYPERTENSION (12/26/2006); HYPOKALEMIA (11/25/2007); HYPOTHYROIDISM (12/26/2006); IBS (11/19/2006); INSOMNIA, PERSISTENT (12/26/2006); KELOID (07/25/2007); MURMUR (07/25/2007); Nausea alone (03/23/2010); PARESTHESIA (04/06/2008); RENAL CYST, LEFT (02/23/2010); Rheumatoid arthritis(714.0) (11/19/2006); RUQ PAIN (11/07/2009); VISION IMPAIRMENT, LOW VISION, 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   She has past surgical history that includes Left thyroidectomy; Breast surgery (2009); OS Cataract extraction (2009); Shoulder surgery; Tubal ligation; knee surgery (Left); Cyst excision from chest; and Ovarian cyst removal (age 57).   Her  family history includes Brain cancer in her brother; Colon cancer in her brother; Diabetes in her  father; Lung cancer in her brother; Stroke in her sister.  She   reports that she has never smoked. She has never used smokeless tobacco. She reports that she does not drink alcohol or use illicit drugs.  Outpatient Prescriptions Prior to Visit  Medication Sig Dispense Refill  . acetaminophen (TYLENOL) 325 MG tablet Take 650 mg by mouth as needed for headache.     Marland Kitchen amLODipine (NORVASC) 5 MG tablet TAKE 1 TABLET BY MOUTH EVERY DAY 90 tablet 3  . aspirin (ASPIRIN CHILDRENS) 81 MG chewable tablet Chew 1 tablet (81 mg total) by mouth daily. 36 tablet 11  . carvedilol (COREG) 12.5 MG tablet Take 1 tablet (12.5 mg total) by mouth 2 (two) times daily with a meal. 180 tablet 3  . Cholecalciferol (VITAMIN D3) 1000 UNITS CAPS Take 1,000 Units by mouth daily.     . furosemide (LASIX) 20 MG tablet TAKE 1 TABLET BY MOUTH EVERY DAY 90 tablet 3  . KLOR-CON M10 10 MEQ tablet TAKE 1 TABLET BY MOUTH EVERY DAY 90 tablet 3  . levothyroxine (SYNTHROID, LEVOTHROID) 50 MCG tablet TAKE 1 TABLET BY MOUTH EVERY DAY 90 tablet 2  . losartan (COZAAR) 100 MG tablet Take 100 mg by mouth daily.    . meclizine (ANTIVERT) 25 MG tablet Take 1 tablet (25 mg total) by mouth 3 (three) times daily as needed for dizziness or nausea. 30 tablet 0  . pantoprazole (PROTONIX) 40 MG tablet Take 1 tablet (40 mg total) by mouth 2 (two) times daily. 180 tablet 3  . lactulose (CEPHULAC) 20 G packet Take 1-2 packets (20-40 g total) by mouth daily as needed. (Patient not taking: Reported on 12/24/2014) 60 each 5  . Linaclotide (LINZESS) 145 MCG CAPS capsule Take 1 capsule (145 mcg total) by mouth daily. (Patient not taking: Reported on 01/09/2015) 30 capsule 1   No facility-administered medications prior to visit.    Social History   Social History  .  Marital Status: Married    Spouse Name: N/A  . Number of Children: 5  . Years of Education: N/A   Occupational History  . retired    Social History Main Topics  . Smoking status:  Never Smoker   . Smokeless tobacco: Never Used  . Alcohol Use: No  . Drug Use: No  . Sexual Activity: Yes     Comment: 1st intercourse 75 yo-Fewer than 5 partners   Other Topics Concern  . None   Social History Narrative   Patient does not get regular exercise     Review of Systems  Constitutional: Positive for fever. Negative for chills and appetite change.  HENT: Positive for postnasal drip, sinus pressure and sore throat. Negative for congestion and ear pain.   Eyes: Negative for pain and redness.  Respiratory: Positive for cough and shortness of breath. Negative for wheezing.   Cardiovascular: Negative for leg swelling.  Gastrointestinal: Negative for nausea, vomiting, abdominal pain, diarrhea, constipation and blood in stool.  Endocrine: Negative for polyuria.  Genitourinary: Negative for dysuria, urgency, frequency and flank pain.  Musculoskeletal: Negative for gait problem.  Skin: Negative for rash.  Neurological: Negative for weakness and headaches.  Psychiatric/Behavioral: Negative for confusion and decreased concentration. The patient is not nervous/anxious.     Objective:  BP 132/86 mmHg  Pulse 64  Temp(Src) 98.4 F (36.9 C) (Oral)  Resp 16  Ht 5' 3.75" (1.619 m)  Wt 210 lb 6.4 oz (95.437 kg)  BMI 36.41 kg/m2  SpO2 97%  Physical Exam  Constitutional: She is oriented to person, place, and time. She appears well-developed and well-nourished. No distress.  HENT:  Head: Normocephalic and atraumatic.  Right Ear: External ear normal.  Left Ear: External ear normal.  Nose: Nose normal.  Eyes: Conjunctivae and EOM are normal. Pupils are equal, round, and reactive to light. No scleral icterus.  Neck: Normal range of motion. Neck supple. No tracheal deviation present.  Cardiovascular: Normal rate, regular rhythm and normal heart sounds.   Pulmonary/Chest: Effort normal. No respiratory distress. She has no wheezes. She has no rales.  Abdominal: She exhibits no  mass. There is no tenderness. There is no rebound and no guarding.  Musculoskeletal: She exhibits no edema.  Lymphadenopathy:    She has no cervical adenopathy.  Neurological: She is alert and oriented to person, place, and time. Coordination normal.  Skin: Skin is warm and dry. No rash noted.  Psychiatric: She has a normal mood and affect. Her behavior is normal.      Assessment & Plan:   Adelayda was seen today for cough and sore throat.  Diagnoses and all orders for this visit:  Acute bronchitis, unspecified organism  Acute pansinusitis, recurrence not specified  Other orders -     amoxicillin-clavulanate (AUGMENTIN) 875-125 MG tablet; Take 1 tablet by mouth 2 (two) times daily. -     pseudoephedrine-guaifenesin (MUCINEX D) 60-600 MG 12 hr tablet; Take 1 tablet by mouth every 12 (twelve) hours. -     chlorpheniramine-HYDROcodone (TUSSIONEX PENNKINETIC ER) 10-8 MG/5ML SUER; Take 5 mLs by mouth 2 (two) times daily.  I am having Ms. Haen start on amoxicillin-clavulanate, pseudoephedrine-guaifenesin, and chlorpheniramine-HYDROcodone. I am also having her maintain her acetaminophen, Vitamin D3, losartan, pantoprazole, levothyroxine, KLOR-CON M10, meclizine, Linaclotide, amLODipine, lactulose, aspirin, carvedilol, and furosemide.  Meds ordered this encounter  Medications  . amoxicillin-clavulanate (AUGMENTIN) 875-125 MG tablet    Sig: Take 1 tablet by mouth 2 (two) times daily.  Dispense:  20 tablet    Refill:  0  . pseudoephedrine-guaifenesin (MUCINEX D) 60-600 MG 12 hr tablet    Sig: Take 1 tablet by mouth every 12 (twelve) hours.    Dispense:  18 tablet    Refill:  0  . chlorpheniramine-HYDROcodone (TUSSIONEX PENNKINETIC ER) 10-8 MG/5ML SUER    Sig: Take 5 mLs by mouth 2 (two) times daily.    Dispense:  60 mL    Refill:  0    Appropriate red flag conditions were discussed with the patient as well as actions that should be taken.  Patient expressed his  understanding.  Follow-up: Return if symptoms worsen or fail to improve.  Roselee Culver, MD

## 2015-01-09 NOTE — Patient Instructions (Signed)

## 2015-01-17 ENCOUNTER — Ambulatory Visit: Payer: Medicare Other

## 2015-01-20 ENCOUNTER — Ambulatory Visit (INDEPENDENT_AMBULATORY_CARE_PROVIDER_SITE_OTHER): Payer: Medicare Other | Admitting: Family

## 2015-01-20 ENCOUNTER — Encounter: Payer: Self-pay | Admitting: Family

## 2015-01-20 VITALS — BP 132/84 | HR 61 | Temp 98.2°F | Resp 18 | Ht 63.0 in | Wt 205.4 lb

## 2015-01-20 DIAGNOSIS — J40 Bronchitis, not specified as acute or chronic: Secondary | ICD-10-CM | POA: Diagnosis not present

## 2015-01-20 MED ORDER — PREDNISONE 20 MG PO TABS: 20.0000 mg | ORAL_TABLET | Freq: Two times a day (BID) | ORAL | Status: DC

## 2015-01-20 MED ORDER — HYDROCOD POLST-CPM POLST ER 10-8 MG/5ML PO SUER: 5.0000 mL | Freq: Two times a day (BID) | ORAL | Status: DC

## 2015-01-20 MED ORDER — ALBUTEROL SULFATE HFA 108 (90 BASE) MCG/ACT IN AERS: 2.0000 | INHALATION_SPRAY | Freq: Four times a day (QID) | RESPIRATORY_TRACT | Status: DC | PRN

## 2015-01-20 MED ORDER — BENZONATATE 100 MG PO CAPS: 100.0000 mg | ORAL_CAPSULE | Freq: Two times a day (BID) | ORAL | Status: DC | PRN

## 2015-01-20 NOTE — Progress Notes (Signed)
Subjective:    Patient ID: Deanna Salazar, female    DOB: February 23, 1940, 75 y.o.   MRN: [id number]  Chief Complaint  Patient presents with  . Cough    congestion and cough that has been there since january 1st    HPI:  Deanna Salazar is a 75 y.o. female who  has a past medical history of Abdominal pain, generalized (02/05/2008); ABDOMINAL PAIN-EPIGASTRIC (03/24/2009); Acute bronchitis (02/15/2007); BACK PAIN (07/20/2008); BREAST CANCER, HX OF (04/16/2007); CHEST PAIN (02/23/2010); COLONIC POLYPS, ADENOMATOUS, HX OF (10/01/2007); CONSTIPATION, CHRONIC (05/10/2007); Dysuria (04/06/2008); FATIGUE (12/26/2006); FATTY LIVER DISEASE (11/19/2006); FIBROMYALGIA (11/19/2006); HEMATOCHEZIA (05/10/2007); HEMORRHOIDS, INTERNAL (05/10/2007); HIP PAIN (07/20/2008); HYPERLIPIDEMIA (07/26/2009); HYPERTENSION (12/26/2006); HYPOKALEMIA (11/25/2007); HYPOTHYROIDISM (12/26/2006); IBS (11/19/2006); INSOMNIA, PERSISTENT (12/26/2006); KELOID (07/25/2007); MURMUR (07/25/2007); Nausea alone (03/23/2010); PARESTHESIA (04/06/2008); RENAL CYST, LEFT (02/23/2010); Rheumatoid arthritis(714.0) (11/19/2006); RUQ PAIN (11/07/2009); VISION IMPAIRMENT, LOW VISION, 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 and presents today for an acute office visit.  Associated symptom of congestion and cough. She was recently seen in Urgent Care and diagnosed with bronchitis Augmentin, Mucinex and Tussionex. She completed the medication as prescribed and notes improvement in her symptoms since completion of the medication. Denies fevers. Modifying factors include lozenges which do help. She also has mild chest pain with coughing. Timing of symptoms is worse at night.   Allergies  Allergen Reactions  . Codeine     REACTION: nausea after a week or so: can take it short term  . Hydrocodone     REACTION: nausea  . Lovastatin Itching    cramps  .  Tramadol Hcl     REACTION: itching     Current Outpatient Prescriptions on File Prior to Visit  Medication Sig Dispense Refill  . acetaminophen (TYLENOL) 325 MG tablet Take 650 mg by mouth as needed for headache.     Marland Kitchen amLODipine (NORVASC) 5 MG tablet TAKE 1 TABLET BY MOUTH EVERY DAY 90 tablet 3  . amoxicillin-clavulanate (AUGMENTIN) 875-125 MG tablet Take 1 tablet by mouth 2 (two) times daily. 20 tablet 0  . aspirin (ASPIRIN CHILDRENS) 81 MG chewable tablet Chew 1 tablet (81 mg total) by mouth daily. 36 tablet 11  . carvedilol (COREG) 12.5 MG tablet Take 1 tablet (12.5 mg total) by mouth 2 (two) times daily with a meal. 180 tablet 3  . Cholecalciferol (VITAMIN D3) 1000 UNITS CAPS Take 1,000 Units by mouth daily.     . furosemide (LASIX) 20 MG tablet TAKE 1 TABLET BY MOUTH EVERY DAY 90 tablet 3  . KLOR-CON M10 10 MEQ tablet TAKE 1 TABLET BY MOUTH EVERY DAY 90 tablet 3  . levothyroxine (SYNTHROID, LEVOTHROID) 50 MCG tablet TAKE 1 TABLET BY MOUTH EVERY DAY 90 tablet 2  . losartan (COZAAR) 100 MG tablet Take 100 mg by mouth daily.    . meclizine (ANTIVERT) 25 MG tablet Take 1 tablet (25 mg total) by mouth 3 (three) times daily as needed for dizziness or nausea. 30 tablet 0  . pantoprazole (PROTONIX) 40 MG tablet Take 1 tablet (40 mg total) by mouth 2 (two) times daily. 180 tablet 3  . pseudoephedrine-guaifenesin (MUCINEX D) 60-600 MG 12 hr tablet Take 1 tablet by mouth every 12 (twelve) hours. 18 tablet 0   No current facility-administered medications on file prior to visit.     Review of Systems  Constitutional: Negative for fever and chills.  HENT: Negative for congestion and  sore throat.   Respiratory: Positive for cough and shortness of breath. Negative for chest tightness.   Neurological: Negative for headaches.      Objective:    BP 132/84 mmHg  Pulse 61  Temp(Src) 98.2 F (36.8 C) (Oral)  Resp 18  Ht 5\' 3"  (1.6 m)  Wt 205 lb 6.4 oz (93.169 kg)  BMI 36.39 kg/m2  SpO2  96% Nursing note and vital signs reviewed.  Physical Exam  Constitutional: She is oriented to person, place, and time. She appears well-developed and well-nourished. No distress.  HENT:  Right Ear: Hearing, tympanic membrane, external ear and ear canal normal.  Left Ear: Hearing, tympanic membrane, external ear and ear canal normal.  Nose: Right sinus exhibits no maxillary sinus tenderness and no frontal sinus tenderness. Left sinus exhibits no maxillary sinus tenderness and no frontal sinus tenderness.  Mouth/Throat: Uvula is midline, oropharynx is clear and moist and mucous membranes are normal.  Cardiovascular: Normal rate, regular rhythm, normal heart sounds and intact distal pulses.   Pulmonary/Chest: Effort normal and breath sounds normal.  Neurological: She is alert and oriented to person, place, and time.  Skin: Skin is warm and dry.  Psychiatric: She has a normal mood and affect. Her behavior is normal. Judgment and thought content normal.       Assessment & Plan:   Problem List Items Addressed This Visit      Respiratory   Bronchitis - Primary    Symptoms and exam consistent with resolving bronchitis and most likely post antibiotic cough. Treat conservatively with over-the-counter medications and start prednisone, Tessalon, and albuterol as needed. Start Tussionex as needed for cough and sleep. Follow-up if symptoms worsen or fail to improve.      Relevant Medications   chlorpheniramine-HYDROcodone (TUSSIONEX PENNKINETIC ER) 10-8 MG/5ML SUER   predniSONE (DELTASONE) 20 MG tablet   benzonatate (TESSALON) 100 MG capsule   albuterol (PROVENTIL HFA;VENTOLIN HFA) 108 (90 Base) MCG/ACT inhaler

## 2015-01-20 NOTE — Patient Instructions (Signed)
Thank you for choosing [facility].  Summary/Instructions:  Your prescription(s) have been submitted to your pharmacy or been printed and provided for you. Please take as directed and contact our office if you believe you are having problem(s) with the medication(s) or have any questions.  If your symptoms worsen or fail to improve, please contact our office for further instruction, or in case of emergency go directly to the emergency room at the closest medical facility.   Please take the medication as prescribed.  It may take another couple of weeks to get completely better.

## 2015-01-20 NOTE — Assessment & Plan Note (Signed)
Symptoms and exam consistent with resolving bronchitis and most likely post antibiotic cough. Treat conservatively with over-the-counter medications and start prednisone, Tessalon, and albuterol as needed. Start Tussionex as needed for cough and sleep. Follow-up if symptoms worsen or fail to improve.

## 2015-01-20 NOTE — Progress Notes (Signed)
Pre visit review using our clinic review tool, if applicable. No additional management support is needed unless otherwise documented below in the visit note. 

## 2015-02-03 ENCOUNTER — Ambulatory Visit
Admission: RE | Admit: 2015-02-03 | Discharge: 2015-02-03 | Disposition: A | Discharge status: Home / Self Care | Payer: Medicare Other | Source: Ambulatory Visit

## 2015-02-03 DIAGNOSIS — Z1231 Encounter for screening mammogram for malignant neoplasm of breast: Secondary | ICD-10-CM

## 2015-03-08 ENCOUNTER — Telehealth: Payer: Self-pay | Admitting: Gastroenterology

## 2015-03-08 NOTE — Telephone Encounter (Signed)
Left message for patient to call back  

## 2015-03-08 NOTE — Telephone Encounter (Signed)
Patient is not able to have a BM despite TID Miralax and dulcolax.  She will come in and see Dr. Fuller Plan on 03/28/15

## 2015-03-31 ENCOUNTER — Other Ambulatory Visit: Payer: Self-pay | Admitting: Gastroenterology

## 2015-04-04 ENCOUNTER — Encounter: Payer: Self-pay | Admitting: Gastroenterology

## 2015-04-04 ENCOUNTER — Ambulatory Visit (INDEPENDENT_AMBULATORY_CARE_PROVIDER_SITE_OTHER): Payer: Medicare Other | Admitting: Gastroenterology

## 2015-04-04 VITALS — BP 140/86 | HR 58 | Ht 63.0 in | Wt 208.0 lb

## 2015-04-04 DIAGNOSIS — K5900 Constipation, unspecified: Secondary | ICD-10-CM | POA: Diagnosis not present

## 2015-04-04 DIAGNOSIS — Z86010 Personal history of colonic polyps: Secondary | ICD-10-CM

## 2015-04-04 DIAGNOSIS — Z800 Family history of malignant neoplasm of digestive organs: Secondary | ICD-10-CM | POA: Diagnosis not present

## 2015-04-04 NOTE — Progress Notes (Signed)
    History of Present Illness: This is a 75 year old female returning for problems with constipation. This has been a problem for many years. She is fearful that she has a bowel blockage despite regular colonoscopies every 5 years. She states that since her brother had colon cancer she is always concerned about her constipation. Despite reassurance and regular colonoscopy she remains concerned about these issues. Her constipation has not changed. She stopped taking MiraLAX as recommended because she had episodes of few loose stools with incontinence. She was advised simply to reduce the Miralax dose or frequency if this became a problem however she did not follow through with that plan. She is currently taking Dulcolax every 5 days and by the third, fourth and fifth day without a BM she is clearly miserable with bloating and constipation symptoms. She is unhappy with her current regimen of taking Dulcolax every 5 days. Colonoscopy performed in June 2014 revealed 4 small adenomatous colon polyps.  Current Medications, Allergies, Past Medical History, Past Surgical History, Family History and Social History were reviewed in [facility] record.  Physical Exam: General: Well developed, well nourished, no acute distress Head: Normocephalic and atraumatic Eyes:  sclerae anicteric, EOMI Ears: Normal auditory acuity Mouth: No deformity or lesions Lungs: Clear throughout to auscultation Heart: Regular rate and rhythm; no murmurs, rubs or bruits Abdomen: Soft, non tender and non distended. No masses, hepatosplenomegaly or hernias noted. Normal Bowel sounds Musculoskeletal: Symmetrical with no gross deformities  Pulses:  Normal pulses noted Extremities: No clubbing, cyanosis, edema or deformities noted Neurological: Alert oriented x 4, grossly nonfocal Psychological:  Alert and cooperative. Normal mood and affect  Assessment and Recommendations:  1. Chronic IBS-constipation.  Noncompliance with recommended medical therapy. MiraLax on a regular schedule 1-3 times every day. Reduce dose if stools too loose. Increase dose if still constipated. I have advised this several times previously and she has not been able to follow through. Linzess was not covered by her insurance. I have again clearly emphasized that she needs to remain on a dose of MiraLAX every day between 1 and 3 times daily and that she should adjust the number of doses for adequate bowel movements. At the minimum she should take 1/2 to 1 dose of MiraLAX every day. Ducolax once every 5 days for refractory constipation however with appropriate Miralax dosing I hope that she could avoid, or at least limit, Ducolax usage. As in the past I have attempted to reassure her that she is having colonoscopies on a regular basis to check for polyps and other problems.  2. History of adenomatous colon polyps. Family history of colon cancer in her brother. Surveillance colonoscopy due June 2019.   3. Fatty liver. Long term low fat diet and weight loss.   4. GERD. Continue pantoprazole 40 mg daily. She has a history of noncompliance with recommended PPI therapy in the past.

## 2015-04-04 NOTE — Patient Instructions (Signed)
Please take Miralax 1 to 3 times daily (over the counter). Titrate as needed for adequate bowel movements.  Take Dulcolax (over the counter) every 5 days as needed for refractory constipation.  Thank you for choosing me and [facility].  Pricilla Riffle. Dagoberto Ligas., MD., Marval Regal

## 2015-04-05 ENCOUNTER — Other Ambulatory Visit (INDEPENDENT_AMBULATORY_CARE_PROVIDER_SITE_OTHER): Payer: Medicare Other

## 2015-04-05 ENCOUNTER — Ambulatory Visit (INDEPENDENT_AMBULATORY_CARE_PROVIDER_SITE_OTHER): Payer: Medicare Other | Admitting: Internal Medicine

## 2015-04-05 ENCOUNTER — Encounter: Payer: Self-pay | Admitting: Internal Medicine

## 2015-04-05 VITALS — BP 148/70 | HR 64 | Wt 209.0 lb

## 2015-04-05 DIAGNOSIS — E119 Type 2 diabetes mellitus without complications: Secondary | ICD-10-CM

## 2015-04-05 DIAGNOSIS — J309 Allergic rhinitis, unspecified: Secondary | ICD-10-CM | POA: Insufficient documentation

## 2015-04-05 DIAGNOSIS — J3089 Other allergic rhinitis: Secondary | ICD-10-CM

## 2015-04-05 DIAGNOSIS — I10 Essential (primary) hypertension: Secondary | ICD-10-CM | POA: Diagnosis not present

## 2015-04-05 DIAGNOSIS — J069 Acute upper respiratory infection, unspecified: Secondary | ICD-10-CM

## 2015-04-05 DIAGNOSIS — R739 Hyperglycemia, unspecified: Secondary | ICD-10-CM | POA: Diagnosis not present

## 2015-04-05 LAB — HEPATIC FUNCTION PANEL
ALK PHOS: 80 U/L (ref 39–117)
ALT: 12 U/L (ref 0–35)
AST: 21 U/L (ref 0–37)
Albumin: 3.9 g/dL (ref 3.5–5.2)
BILIRUBIN DIRECT: 0.1 mg/dL (ref 0.0–0.3)
BILIRUBIN TOTAL: 0.4 mg/dL (ref 0.2–1.2)
Total Protein: 6.8 g/dL (ref 6.0–8.3)

## 2015-04-05 LAB — BASIC METABOLIC PANEL
BUN: 15 mg/dL (ref 6–23)
CHLORIDE: 107 meq/L (ref 96–112)
CO2: 30 meq/L (ref 19–32)
CREATININE: 0.98 mg/dL (ref 0.40–1.20)
Calcium: 9.5 mg/dL (ref 8.4–10.5)
GFR: 71.04 mL/min (ref >60.00)
Glucose, Bld: 130 mg/dL — ABNORMAL HIGH (ref 70–99)
Potassium: 3.9 mEq/L (ref 3.5–5.1)
Sodium: 143 mEq/L (ref 135–145)

## 2015-04-05 LAB — HEMOGLOBIN A1C: Hgb A1c MFr Bld: 6.6 % — ABNORMAL HIGH (ref 4.6–6.5)

## 2015-04-05 MED ORDER — LORATADINE 10 MG PO TABS: 10.0000 mg | ORAL_TABLET | Freq: Every day | ORAL | Status: DC

## 2015-04-05 MED ORDER — LEVOTHYROXINE SODIUM 25 MCG PO TABS: 25.0000 ug | ORAL_TABLET | Freq: Every day | ORAL | Status: DC

## 2015-04-05 MED ORDER — FUROSEMIDE 20 MG PO TABS: 20.0000 mg | ORAL_TABLET | Freq: Every day | ORAL | Status: DC

## 2015-04-05 MED ORDER — FLUTICASONE PROPIONATE 50 MCG/ACT NA SUSP: 2.0000 | Freq: Every day | NASAL | Status: DC

## 2015-04-05 MED ORDER — CEFDINIR 300 MG PO CAPS: 300.0000 mg | ORAL_CAPSULE | Freq: Two times a day (BID) | ORAL | Status: DC

## 2015-04-05 NOTE — Progress Notes (Signed)
Subjective:  Patient ID: Deanna Salazar, female    DOB: 1940/12/21  Age: 75 y.o. MRN: [id number]  CC: No chief complaint on file.   HPI [employer] presents for HTN, hypothyroidism, asthma f/u C/o allergies  Outpatient Prescriptions Prior to Visit  Medication Sig Dispense Refill  . acetaminophen (TYLENOL) 325 MG tablet Take 650 mg by mouth as needed for headache.     . albuterol (PROVENTIL HFA;VENTOLIN HFA) 108 (90 Base) MCG/ACT inhaler Inhale 2 puffs into the lungs every 6 (six) hours as needed for wheezing or shortness of breath. 1 Inhaler 0  . amLODipine (NORVASC) 5 MG tablet TAKE 1 TABLET BY MOUTH EVERY DAY 90 tablet 3  . aspirin (ASPIRIN CHILDRENS) 81 MG chewable tablet Chew 1 tablet (81 mg total) by mouth daily. 36 tablet 11  . carvedilol (COREG) 12.5 MG tablet Take 1 tablet (12.5 mg total) by mouth 2 (two) times daily with a meal. 180 tablet 3  . Cholecalciferol (VITAMIN D3) 1000 UNITS CAPS Take 1,000 Units by mouth daily.     . furosemide (LASIX) 20 MG tablet TAKE 1 TABLET BY MOUTH EVERY DAY 90 tablet 3  . KLOR-CON M10 10 MEQ tablet TAKE 1 TABLET BY MOUTH EVERY DAY 90 tablet 3  . losartan (COZAAR) 100 MG tablet Take 100 mg by mouth daily.    . meclizine (ANTIVERT) 25 MG tablet Take 1 tablet (25 mg total) by mouth 3 (three) times daily as needed for dizziness or nausea. 30 tablet 0  . pantoprazole (PROTONIX) 40 MG tablet TAKE 1 TABLET BY MOUTH TWICE A DAY 180 tablet 1  . levothyroxine (SYNTHROID, LEVOTHROID) 50 MCG tablet TAKE 1 TABLET BY MOUTH EVERY DAY 90 tablet 2   No facility-administered medications prior to visit.    ROS Review of Systems  Constitutional: Negative for chills, activity change, appetite change, fatigue and unexpected weight change.  HENT: Positive for ear pain, postnasal drip, rhinorrhea and sneezing. Negative for congestion, mouth sores and sinus pressure.   Eyes: Negative for visual disturbance.  Respiratory: Negative for cough and chest  tightness.   Gastrointestinal: Negative for nausea and abdominal pain.  Genitourinary: Negative for frequency, difficulty urinating and vaginal pain.  Musculoskeletal: Negative for back pain and gait problem.  Skin: Negative for pallor and rash.  Neurological: Negative for dizziness, tremors, weakness, numbness and headaches.  Psychiatric/Behavioral: Negative for confusion and sleep disturbance.    Objective:  BP 148/70 mmHg  Pulse 64  Wt 209 lb (94.802 kg)  SpO2 92%  BP Readings from Last 3 Encounters:  04/05/15 148/70  04/04/15 140/86  01/20/15 132/84    Wt Readings from Last 3 Encounters:  04/05/15 209 lb (94.802 kg)  04/04/15 208 lb (94.348 kg)  01/20/15 205 lb 6.4 oz (93.169 kg)    Physical Exam  Constitutional: She appears well-developed. No distress.  HENT:  Head: Normocephalic.  Right Ear: External ear normal.  Left Ear: External ear normal.  Nose: Nose normal.  Mouth/Throat: Oropharynx is clear and moist.  Eyes: Conjunctivae are normal. Pupils are equal, round, and reactive to light. Right eye exhibits no discharge. Left eye exhibits no discharge.  Neck: Normal range of motion. Neck supple. No JVD present. No tracheal deviation present. No thyromegaly present.  Cardiovascular: Normal rate, regular rhythm and normal heart sounds.   Pulmonary/Chest: No stridor. No respiratory distress. She has no wheezes.  Abdominal: Soft. Bowel sounds are normal. She exhibits no distension and no mass. There is no tenderness. There  is no rebound and no guarding.  Musculoskeletal: She exhibits no edema or tenderness.  Lymphadenopathy:    She has no cervical adenopathy.  Neurological: She displays normal reflexes. No cranial nerve deficit. She exhibits normal muscle tone. Coordination normal.  Skin: No rash noted. No erythema.  Psychiatric: She has a normal mood and affect. Her behavior is normal. Judgment and thought content normal.  swollen nares Obese  Lab Results  Component  Value Date   WBC 6.2 12/06/2014   HGB 12.2 12/06/2014   HCT 37.4 12/06/2014   PLT 220.0 12/06/2014   GLUCOSE 114* 12/06/2014   CHOL 198 07/14/2013   TRIG 82.0 07/14/2013   HDL 46.50 07/14/2013   LDLDIRECT 180.9 07/26/2009   LDLCALC 135* 07/14/2013   ALT 16 12/06/2014   AST 27 12/06/2014   NA 140 12/06/2014   K 3.9 12/06/2014   CL 105 12/06/2014   CREATININE 0.87 12/06/2014   BUN 14 12/06/2014   CO2 28 12/06/2014   TSH 1.94 12/06/2014   HGBA1C 6.2 12/06/2014   MICROALBUR 1.8 12/06/2014    Mm Screening Breast Tomo Bilateral  02/04/2015  CLINICAL DATA:  Screening. EXAM: DIGITAL SCREENING BILATERAL MAMMOGRAM WITH 3D TOMO WITH CAD COMPARISON:  Previous exam(s). ACR Breast Density Category b: There are scattered areas of fibroglandular density. FINDINGS: There are no findings suspicious for malignancy. Images were processed with CAD. IMPRESSION: No mammographic evidence of malignancy. A result letter of this screening mammogram will be mailed directly to the patient. RECOMMENDATION: Screening mammogram in one year. (Code:SM-B-01Y) BI-RADS CATEGORY  1: Negative. Electronically Signed   By: Abelardo Diesel M.D.   On: 02/04/2015 09:56    Assessment & Plan:   There are no diagnoses linked to this encounter. I have discontinued Ms. Magallon's levothyroxine. I am also having her maintain her acetaminophen, Vitamin D3, losartan, KLOR-CON M10, meclizine, amLODipine, aspirin, carvedilol, furosemide, albuterol, and pantoprazole.  No orders of the defined types were placed in this encounter.     Follow-up: No Follow-up on file.  Walker Kehr, MD

## 2015-04-05 NOTE — Assessment & Plan Note (Signed)
Labs On diet 

## 2015-04-05 NOTE — Assessment & Plan Note (Signed)
Losartan, amlodipine, Coreg 

## 2015-04-05 NOTE — Assessment & Plan Note (Signed)
Labs

## 2015-04-05 NOTE — Assessment & Plan Note (Signed)
Cefdinir if worse

## 2015-04-05 NOTE — Progress Notes (Signed)
Pre visit review using our clinic review tool, if applicable. No additional management support is needed unless otherwise documented below in the visit note. 

## 2015-04-06 ENCOUNTER — Telehealth: Payer: Self-pay | Admitting: Internal Medicine

## 2015-04-06 NOTE — Telephone Encounter (Signed)
Pt called back regarding labs and I let her know Dr. Parks Neptune notes. No need to call back

## 2015-04-07 ENCOUNTER — Other Ambulatory Visit: Payer: Self-pay

## 2015-04-07 MED ORDER — LOSARTAN POTASSIUM 100 MG PO TABS: 100.0000 mg | ORAL_TABLET | Freq: Every day | ORAL | Status: DC

## 2015-05-31 ENCOUNTER — Other Ambulatory Visit: Payer: Self-pay | Admitting: Internal Medicine

## 2015-06-15 ENCOUNTER — Ambulatory Visit (INDEPENDENT_AMBULATORY_CARE_PROVIDER_SITE_OTHER): Payer: Medicare Other | Admitting: Internal Medicine

## 2015-06-15 ENCOUNTER — Encounter: Payer: Self-pay | Admitting: Internal Medicine

## 2015-06-15 VITALS — BP 148/80 | HR 62 | Temp 98.5°F | Wt 208.0 lb

## 2015-06-15 DIAGNOSIS — K5909 Other constipation: Secondary | ICD-10-CM | POA: Diagnosis not present

## 2015-06-15 DIAGNOSIS — K581 Irritable bowel syndrome with constipation: Secondary | ICD-10-CM | POA: Diagnosis not present

## 2015-06-15 DIAGNOSIS — R109 Unspecified abdominal pain: Secondary | ICD-10-CM

## 2015-06-15 DIAGNOSIS — M797 Fibromyalgia: Secondary | ICD-10-CM

## 2015-06-15 NOTE — Assessment & Plan Note (Signed)
Pt is worried about colon ca. Colonoscopy is not due yet. I gave her an option of doing a Cologuard test - she will think about it CT ordered Try Senna, MOM, Castor oil

## 2015-06-15 NOTE — Patient Instructions (Signed)
Try Senna, Milk of magnesia, Castor oil     Constipation, Adult Constipation is when a person has fewer than three bowel movements a week, has difficulty having a bowel movement, or has stools that are dry, hard, or larger than normal. As people grow older, constipation is more common. A low-fiber diet, not taking in enough fluids, and taking certain medicines may make constipation worse.  CAUSES   Certain medicines, such as antidepressants, pain medicine, iron supplements, antacids, and water pills.   Certain diseases, such as diabetes, irritable bowel syndrome (IBS), thyroid disease, or depression.   Not drinking enough water.   Not eating enough fiber-rich foods.   Stress or travel.   Lack of physical activity or exercise.   Ignoring the urge to have a bowel movement.   Using laxatives too much.  SIGNS AND SYMPTOMS   Having fewer than three bowel movements a week.   Straining to have a bowel movement.   Having stools that are hard, dry, or larger than normal.   Feeling full or bloated.   Pain in the lower abdomen.   Not feeling relief after having a bowel movement.  DIAGNOSIS  Your health care provider will take a medical history and perform a physical exam. Further testing may be done for severe constipation. Some tests may include:  A barium enema X-ray to examine your rectum, colon, and, sometimes, your small intestine.   A sigmoidoscopy to examine your lower colon.   A colonoscopy to examine your entire colon. TREATMENT  Treatment will depend on the severity of your constipation and what is causing it. Some dietary treatments include drinking more fluids and eating more fiber-rich foods. Lifestyle treatments may include regular exercise. If these diet and lifestyle recommendations do not help, your health care provider may recommend taking over-the-counter laxative medicines to help you have bowel movements. Prescription medicines may be prescribed  if over-the-counter medicines do not work.  HOME CARE INSTRUCTIONS   Eat foods that have a lot of fiber, such as fruits, vegetables, whole grains, and beans.  Limit foods high in fat and processed sugars, such as french fries, hamburgers, cookies, candies, and soda.   A fiber supplement may be added to your diet if you cannot get enough fiber from foods.   Drink enough fluids to keep your urine clear or pale yellow.   Exercise regularly or as directed by your health care provider.   Go to the restroom when you have the urge to go. Do not hold it.   Only take over-the-counter or prescription medicines as directed by your health care provider. Do not take other medicines for constipation without talking to your health care provider first.  [place] IF:   You have bright red blood in your stool.   Your constipation lasts for more than 4 days or gets worse.   You have abdominal or rectal pain.   You have thin, pencil-like stools.   You have unexplained weight loss. MAKE SURE YOU:   Understand these instructions.  Will watch your condition.  Will get help right away if you are not doing well or get worse.   This information is not intended to replace advice given to you by your health care provider. Make sure you discuss any questions you have with your health care provider.   Document Released: 09/23/2003 Document Revised: 01/15/2014 Document Reviewed: 10/06/2012 Elsevier Interactive Patient Education [employer].

## 2015-06-15 NOTE — Assessment & Plan Note (Signed)
Chronic On Duloxetine, Aleve prn

## 2015-06-15 NOTE — Progress Notes (Signed)
Subjective:  Patient ID: Deanna Salazar, female    DOB: 1940/06/08  Age: 75 y.o. MRN: [id number]  CC: Abdominal Pain and Constipation   HPI Deanna Salazar presents for severe constipation, abd pain, IBS-c, HTN f/u  Outpatient Prescriptions Prior to Visit  Medication Sig Dispense Refill  . acetaminophen (TYLENOL) 325 MG tablet Take 650 mg by mouth as needed for headache.     Marland Kitchen amLODipine (NORVASC) 5 MG tablet TAKE 1 TABLET BY MOUTH EVERY DAY 90 tablet 3  . aspirin (ASPIRIN CHILDRENS) 81 MG chewable tablet Chew 1 tablet (81 mg total) by mouth daily. 36 tablet 11  . carvedilol (COREG) 12.5 MG tablet Take 1 tablet (12.5 mg total) by mouth 2 (two) times daily with a meal. 180 tablet 3  . Cholecalciferol (VITAMIN D3) 1000 UNITS CAPS Take 1,000 Units by mouth daily.     . furosemide (LASIX) 20 MG tablet Take 1 tablet (20 mg total) by mouth daily. 90 tablet 3  . KLOR-CON M10 10 MEQ tablet TAKE 1 TABLET BY MOUTH EVERY DAY 90 tablet 3  . levothyroxine (SYNTHROID, LEVOTHROID) 25 MCG tablet Take 1 tablet (25 mcg total) by mouth daily before breakfast. 90 tablet 3  . loratadine (CLARITIN) 10 MG tablet Take 1 tablet (10 mg total) by mouth daily. 100 tablet 3  . losartan (COZAAR) 100 MG tablet Take 1 tablet (100 mg total) by mouth daily. 90 tablet 3  . meclizine (ANTIVERT) 25 MG tablet Take 1 tablet (25 mg total) by mouth 3 (three) times daily as needed for dizziness or nausea. 30 tablet 0  . pantoprazole (PROTONIX) 40 MG tablet TAKE 1 TABLET BY MOUTH TWICE A DAY 180 tablet 1  . albuterol (PROVENTIL HFA;VENTOLIN HFA) 108 (90 Base) MCG/ACT inhaler Inhale 2 puffs into the lungs every 6 (six) hours as needed for wheezing or shortness of breath. (Patient not taking: Reported on 06/15/2015) 1 Inhaler 0  . fluticasone (FLONASE) 50 MCG/ACT nasal spray Place 2 sprays into both nostrils daily. (Patient not taking: Reported on 06/15/2015) 16 g 6  . cefdinir (OMNICEF) 300 MG capsule Take 1 capsule (300 mg total) by  mouth 2 (two) times daily. (Patient not taking: Reported on 06/15/2015) 20 capsule 0  . levothyroxine (SYNTHROID, LEVOTHROID) 50 MCG tablet TAKE 1 TABLET BY MOUTH EVERY DAY (Patient not taking: Reported on 06/15/2015) 90 tablet 1   No facility-administered medications prior to visit.    ROS Review of Systems  Constitutional: Positive for fatigue. Negative for chills, activity change, appetite change and unexpected weight change.  HENT: Negative for congestion, mouth sores and sinus pressure.   Eyes: Negative for visual disturbance.  Respiratory: Negative for cough and chest tightness.   Gastrointestinal: Positive for abdominal pain, constipation and abdominal distention. Negative for nausea.  Genitourinary: Negative for frequency, difficulty urinating and vaginal pain.  Musculoskeletal: Negative for back pain and gait problem.  Skin: Negative for pallor and rash.  Neurological: Negative for dizziness, tremors, weakness, numbness and headaches.  Psychiatric/Behavioral: Negative for confusion and sleep disturbance.    Objective:  BP 148/80 mmHg  Pulse 62  Temp(Src) 98.5 F (36.9 C) (Oral)  Wt 208 lb (94.348 kg)  SpO2 97%  BP Readings from Last 3 Encounters:  06/15/15 148/80  04/05/15 148/70  04/04/15 140/86    Wt Readings from Last 3 Encounters:  06/15/15 208 lb (94.348 kg)  04/05/15 209 lb (94.802 kg)  04/04/15 208 lb (94.348 kg)    Physical Exam  Constitutional: She  appears well-developed. No distress.  HENT:  Head: Normocephalic.  Right Ear: External ear normal.  Left Ear: External ear normal.  Nose: Nose normal.  Mouth/Throat: Oropharynx is clear and moist.  Eyes: Conjunctivae are normal. Pupils are equal, round, and reactive to light. Right eye exhibits no discharge. Left eye exhibits no discharge.  Neck: Normal range of motion. Neck supple. No JVD present. No tracheal deviation present. No thyromegaly present.  Cardiovascular: Normal rate, regular rhythm and normal  heart sounds.   Pulmonary/Chest: No stridor. No respiratory distress. She has no wheezes.  Abdominal: Soft. Bowel sounds are normal. She exhibits no distension and no mass. There is no tenderness. There is no rebound and no guarding.  Musculoskeletal: She exhibits tenderness. She exhibits no edema.  Lymphadenopathy:    She has no cervical adenopathy.  Neurological: She displays normal reflexes. No cranial nerve deficit. She exhibits normal muscle tone. Coordination normal.  Skin: No rash noted. No erythema.  Psychiatric: She has a normal mood and affect. Her behavior is normal. Judgment and thought content normal.  Obese abd is sensitive  Lab Results  Component Value Date   WBC 6.2 12/06/2014   HGB 12.2 12/06/2014   HCT 37.4 12/06/2014   PLT 220.0 12/06/2014   GLUCOSE 130* 04/05/2015   CHOL 198 07/14/2013   TRIG 82.0 07/14/2013   HDL 46.50 07/14/2013   LDLDIRECT 180.9 07/26/2009   LDLCALC 135* 07/14/2013   ALT 12 04/05/2015   AST 21 04/05/2015   NA 143 04/05/2015   K 3.9 04/05/2015   CL 107 04/05/2015   CREATININE 0.98 04/05/2015   BUN 15 04/05/2015   CO2 30 04/05/2015   TSH 1.94 12/06/2014   HGBA1C 6.6* 04/05/2015   MICROALBUR 1.8 12/06/2014    Mm Screening Breast Tomo Bilateral  02/04/2015  CLINICAL DATA:  Screening. EXAM: DIGITAL SCREENING BILATERAL MAMMOGRAM WITH 3D TOMO WITH CAD COMPARISON:  Previous exam(s). ACR Breast Density Category b: There are scattered areas of fibroglandular density. FINDINGS: There are no findings suspicious for malignancy. Images were processed with CAD. IMPRESSION: No mammographic evidence of malignancy. A result letter of this screening mammogram will be mailed directly to the patient. RECOMMENDATION: Screening mammogram in one year. (Code:SM-B-01Y) BI-RADS CATEGORY  1: Negative. Electronically Signed   By: Deanna Salazar M.D.   On: 02/04/2015 09:56    Assessment & Plan:   Deanna Salazar was seen today for abdominal pain and  constipation.  Diagnoses and all orders for this visit:  CONSTIPATION, CHRONIC  Irritable bowel syndrome with constipation   I have discontinued [name]'s cefdinir. I am also having her maintain her acetaminophen, Vitamin D3, KLOR-CON M10, meclizine, amLODipine, aspirin, carvedilol, albuterol, pantoprazole, furosemide, levothyroxine, loratadine, fluticasone, and losartan.  No orders of the defined types were placed in this encounter.     Follow-up: No Follow-up on file.  Walker Kehr, MD

## 2015-06-15 NOTE — Assessment & Plan Note (Signed)
Pt saw Dr Fuller Plan 11/16  Lactulose did not work Pt is worried about colon ca. Colonoscopy is not due yet. I gave her an option of doing a Cologuard test - she will think about it CT ordered Try Senna, MOM, Castor oil

## 2015-06-15 NOTE — Progress Notes (Signed)
Pre visit review using our clinic review tool, if applicable. No additional management support is needed unless otherwise documented below in the visit note. 

## 2015-06-20 ENCOUNTER — Ambulatory Visit (INDEPENDENT_AMBULATORY_CARE_PROVIDER_SITE_OTHER)
Admission: RE | Admit: 2015-06-20 | Discharge: 2015-06-20 | Disposition: A | Discharge status: Home / Self Care | Payer: Medicare Other | Source: Ambulatory Visit | Attending: Internal Medicine | Admitting: Internal Medicine

## 2015-06-20 DIAGNOSIS — R109 Unspecified abdominal pain: Secondary | ICD-10-CM | POA: Diagnosis not present

## 2015-06-20 DIAGNOSIS — K5909 Other constipation: Secondary | ICD-10-CM

## 2015-06-20 MED ORDER — IOPAMIDOL (ISOVUE-300) INJECTION 61%
100.0000 mL | Freq: Once | INTRAVENOUS | Status: AC | PRN
  Administered 2015-06-20: 100 mL via INTRAVENOUS

## 2015-08-05 ENCOUNTER — Ambulatory Visit: Payer: Medicare Other | Admitting: Internal Medicine

## 2015-08-10 ENCOUNTER — Telehealth: Payer: Self-pay | Admitting: Emergency Medicine

## 2015-08-10 NOTE — Telephone Encounter (Signed)
Pt states something is going on with her left shoulder. She had surgery on it about 2 yrs ago. Wants to know if she should make an appt to see you or if you think you need to refer her to somebody. Please follow up thanks.

## 2015-08-12 ENCOUNTER — Other Ambulatory Visit: Payer: Self-pay | Admitting: Internal Medicine

## 2015-08-12 NOTE — Telephone Encounter (Signed)
She should see a Psychologist, sport and exercise who did the surgery I think Thx

## 2015-08-15 NOTE — Telephone Encounter (Signed)
Left message advising patient of dr plotnikovs note 

## 2015-09-15 ENCOUNTER — Ambulatory Visit: Payer: Medicare Other | Admitting: Internal Medicine

## 2015-10-04 ENCOUNTER — Other Ambulatory Visit (INDEPENDENT_AMBULATORY_CARE_PROVIDER_SITE_OTHER): Payer: Medicare Other

## 2015-10-04 ENCOUNTER — Encounter: Payer: Self-pay | Admitting: Internal Medicine

## 2015-10-04 ENCOUNTER — Ambulatory Visit (INDEPENDENT_AMBULATORY_CARE_PROVIDER_SITE_OTHER): Payer: Medicare Other | Admitting: Internal Medicine

## 2015-10-04 ENCOUNTER — Other Ambulatory Visit: Payer: Self-pay | Admitting: Internal Medicine

## 2015-10-04 VITALS — BP 138/80 | HR 63 | Temp 98.9°F | Wt 208.0 lb

## 2015-10-04 DIAGNOSIS — C50412 Malignant neoplasm of upper-outer quadrant of left female breast: Secondary | ICD-10-CM

## 2015-10-04 DIAGNOSIS — E119 Type 2 diabetes mellitus without complications: Secondary | ICD-10-CM

## 2015-10-04 DIAGNOSIS — Z23 Encounter for immunization: Secondary | ICD-10-CM

## 2015-10-04 DIAGNOSIS — I10 Essential (primary) hypertension: Secondary | ICD-10-CM

## 2015-10-04 DIAGNOSIS — R739 Hyperglycemia, unspecified: Secondary | ICD-10-CM

## 2015-10-04 DIAGNOSIS — K219 Gastro-esophageal reflux disease without esophagitis: Secondary | ICD-10-CM

## 2015-10-04 DIAGNOSIS — L304 Erythema intertrigo: Secondary | ICD-10-CM

## 2015-10-04 DIAGNOSIS — N644 Mastodynia: Secondary | ICD-10-CM | POA: Diagnosis not present

## 2015-10-04 LAB — HEPATIC FUNCTION PANEL
ALK PHOS: 84 U/L (ref 39–117)
ALT: 10 U/L (ref 0–35)
AST: 18 U/L (ref 0–37)
Albumin: 3.9 g/dL (ref 3.5–5.2)
BILIRUBIN TOTAL: 0.7 mg/dL (ref 0.2–1.2)
Bilirubin, Direct: 0 mg/dL (ref 0.0–0.3)
Total Protein: 7 g/dL (ref 6.0–8.3)

## 2015-10-04 LAB — BASIC METABOLIC PANEL
BUN: 8 mg/dL (ref 6–23)
CALCIUM: 9.3 mg/dL (ref 8.4–10.5)
CO2: 32 mEq/L (ref 19–32)
Chloride: 104 mEq/L (ref 96–112)
Creatinine, Ser: 1.04 mg/dL (ref 0.40–1.20)
GFR: 66.24 mL/min (ref >60.00)
Glucose, Bld: 115 mg/dL — ABNORMAL HIGH (ref 70–99)
POTASSIUM: 4.2 meq/L (ref 3.5–5.1)
SODIUM: 142 meq/L (ref 135–145)

## 2015-10-04 LAB — URINALYSIS, ROUTINE W REFLEX MICROSCOPIC
Bilirubin Urine: NEGATIVE
KETONES UR: NEGATIVE
LEUKOCYTES UA: NEGATIVE
NITRITE: NEGATIVE
Total Protein, Urine: NEGATIVE
Urine Glucose: NEGATIVE
Urobilinogen, UA: 0.2 (ref 0.0–1.0)
pH: 6 (ref 5.0–8.0)

## 2015-10-04 LAB — MICROALBUMIN / CREATININE URINE RATIO
Creatinine,U: 75.7 mg/dL
Microalb Creat Ratio: 0.9 mg/g (ref 0.0–30.0)

## 2015-10-04 LAB — HEMOGLOBIN A1C: Hgb A1c MFr Bld: 6.5 % (ref 4.6–6.5)

## 2015-10-04 MED ORDER — KETOCONAZOLE 2 % EX CREA: 1.0000 "application " | TOPICAL_CREAM | Freq: Two times a day (BID) | CUTANEOUS | 1 refills | Status: DC

## 2015-10-04 NOTE — Assessment & Plan Note (Signed)
9/17 Ketocon cream

## 2015-10-04 NOTE — Assessment & Plan Note (Signed)
Labs

## 2015-10-04 NOTE — Assessment & Plan Note (Signed)
Losartan, amlodipine, Coreg 

## 2015-10-04 NOTE — Assessment & Plan Note (Signed)
Ordered mammo/US L breast w/a scar - sensitive in the left outer quadrant

## 2015-10-04 NOTE — Assessment & Plan Note (Signed)
labs

## 2015-10-04 NOTE — Progress Notes (Signed)
Pre visit review using our clinic review tool, if applicable. No additional management support is needed unless otherwise documented below in the visit note. 

## 2015-10-04 NOTE — Assessment & Plan Note (Signed)
Protonix.  ?

## 2015-10-04 NOTE — Addendum Note (Signed)
Addended by: Cresenciano Lick on: 10/04/2015 11:34 AM   Modules accepted: Orders

## 2015-10-04 NOTE — Progress Notes (Signed)
Subjective:  Patient ID: Deanna Salazar, female    DOB: [birth year]  Age: 75 y.o. MRN: [id number]  CC: No chief complaint on file.   HPI [employer] presents for L breast lump she found. F/u HTN, GERD  Outpatient Medications Prior to Visit  Medication Sig Dispense Refill  . acetaminophen (TYLENOL) 325 MG tablet Take 650 mg by mouth as needed for headache.     . albuterol (PROVENTIL HFA;VENTOLIN HFA) 108 (90 Base) MCG/ACT inhaler Inhale 2 puffs into the lungs every 6 (six) hours as needed for wheezing or shortness of breath. 1 Inhaler 0  . amLODipine (NORVASC) 5 MG tablet TAKE 1 TABLET BY MOUTH EVERY DAY 90 tablet 3  . aspirin (ASPIRIN CHILDRENS) 81 MG chewable tablet Chew 1 tablet (81 mg total) by mouth daily. 36 tablet 11  . carvedilol (COREG) 12.5 MG tablet Take 1 tablet (12.5 mg total) by mouth 2 (two) times daily with a meal. 180 tablet 3  . Cholecalciferol (VITAMIN D3) 1000 UNITS CAPS Take 1,000 Units by mouth daily.     . fluticasone (FLONASE) 50 MCG/ACT nasal spray Place 2 sprays into both nostrils daily. 16 g 6  . furosemide (LASIX) 20 MG tablet Take 1 tablet (20 mg total) by mouth daily. 90 tablet 3  . levothyroxine (SYNTHROID, LEVOTHROID) 25 MCG tablet Take 1 tablet (25 mcg total) by mouth daily before breakfast. 90 tablet 3  . loratadine (CLARITIN) 10 MG tablet Take 1 tablet (10 mg total) by mouth daily. 100 tablet 3  . losartan (COZAAR) 100 MG tablet Take 1 tablet (100 mg total) by mouth daily. 90 tablet 3  . pantoprazole (PROTONIX) 40 MG tablet TAKE 1 TABLET BY MOUTH TWICE A DAY 180 tablet 1  . potassium chloride (K-DUR) 10 MEQ tablet take 1 tablet by mouth once daily 90 tablet 0  . KLOR-CON M10 10 MEQ tablet TAKE 1 TABLET BY MOUTH EVERY DAY 90 tablet 3   No facility-administered medications prior to visit.     ROS Review of Systems  Constitutional: Negative for activity change, appetite change, chills, fatigue and unexpected weight change.  HENT: Negative for  congestion, mouth sores and sinus pressure.   Eyes: Negative for visual disturbance.  Respiratory: Negative for cough and chest tightness.   Gastrointestinal: Negative for abdominal pain and nausea.  Genitourinary: Negative for difficulty urinating, frequency and vaginal pain.  Musculoskeletal: Negative for back pain and gait problem.  Skin: Negative for pallor and rash.  Neurological: Negative for dizziness, tremors, weakness, numbness and headaches.  Psychiatric/Behavioral: Negative for confusion and sleep disturbance.    Objective:  BP 138/80   Pulse 63   Temp 98.9 F (37.2 C) (Oral)   Wt 208 lb (94.3 kg)   SpO2 97%   BMI 36.85 kg/m   BP Readings from Last 3 Encounters:  10/04/15 138/80  06/15/15 (!) 148/80  04/05/15 (!) 148/70    Wt Readings from Last 3 Encounters:  10/04/15 208 lb (94.3 kg)  06/15/15 208 lb (94.3 kg)  04/05/15 209 lb (94.8 kg)    Physical Exam  Constitutional: She appears well-developed. No distress.  HENT:  Head: Normocephalic.  Right Ear: External ear normal.  Left Ear: External ear normal.  Nose: Nose normal.  Mouth/Throat: Oropharynx is clear and moist.  Eyes: Conjunctivae are normal. Pupils are equal, round, and reactive to light. Right eye exhibits no discharge. Left eye exhibits no discharge.  Neck: Normal range of motion. Neck supple. No JVD present. No  tracheal deviation present. No thyromegaly present.  Cardiovascular: Normal rate, regular rhythm and normal heart sounds.   Pulmonary/Chest: No stridor. No respiratory distress. She has no wheezes.  Abdominal: Soft. Bowel sounds are normal. She exhibits no distension and no mass. There is no tenderness. There is no rebound and no guarding.  Musculoskeletal: She exhibits no edema or tenderness.  Lymphadenopathy:    She has no cervical adenopathy.  Neurological: She displays normal reflexes. No cranial nerve deficit. She exhibits normal muscle tone. Coordination normal.  Skin: No rash  noted. No erythema.  Psychiatric: She has a normal mood and affect. Her behavior is normal. Judgment and thought content normal.  R breast ok L breast w/a scar - sensitive in the left outer quadrantNo rash  Lab Results  Component Value Date   WBC 6.2 12/06/2014   HGB 12.2 12/06/2014   HCT 37.4 12/06/2014   PLT 220.0 12/06/2014   GLUCOSE 130 (H) 04/05/2015   CHOL 198 07/14/2013   TRIG 82.0 07/14/2013   HDL 46.50 07/14/2013   LDLDIRECT 180.9 07/26/2009   LDLCALC 135 (H) 07/14/2013   ALT 12 04/05/2015   AST 21 04/05/2015   NA 143 04/05/2015   K 3.9 04/05/2015   CL 107 04/05/2015   CREATININE 0.98 04/05/2015   BUN 15 04/05/2015   CO2 30 04/05/2015   TSH 1.94 12/06/2014   HGBA1C 6.6 (H) 04/05/2015   MICROALBUR 1.8 12/06/2014    Ct Abdomen Pelvis W Contrast  Result Date: 06/20/2015 CLINICAL DATA:  Generalized abdominal pain EXAM: CT ABDOMEN AND PELVIS WITH CONTRAST TECHNIQUE: Multidetector CT imaging of the abdomen and pelvis was performed using the standard protocol following bolus administration of intravenous contrast. CONTRAST:  172mL ISOVUE-300 IOPAMIDOL (ISOVUE-300) INJECTION 61% COMPARISON:  05/09/2004 FINDINGS: Lung bases are free of acute infiltrate or sizable effusion. The liver is diffusely decreased in attenuation consistent with fatty infiltration. The gallbladder, spleen, adrenal glands and pancreas are within normal limits. The kidneys are well visualized bilaterally. No renal calculi or obstructive changes are seen. Renal cysts are noted on the left. The largest of these is noted posteriorly measuring 5.3 cm in greatest dimension. The appendix is not well visualized although no inflammatory changes are seen. The bladder is well distended. The uterus and ovaries are within normal limits. No pelvic mass lesion is seen. No acute bony abnormality is seen. Degenerative changes at L5-S1 are noted with mild anterolisthesis. IMPRESSION: Chronic changes without acute abnormality.  Electronically Signed   By: Inez Catalina M.D.   On: 06/20/2015 14:53    Assessment & Plan:   There are no diagnoses linked to this encounter. I have discontinued Ms. Bosshart's KLOR-CON M10. I am also having her maintain her acetaminophen, Vitamin D3, amLODipine, aspirin, carvedilol, albuterol, pantoprazole, furosemide, levothyroxine, loratadine, fluticasone, losartan, potassium chloride, and methocarbamol.  Meds ordered this encounter  Medications  . methocarbamol (ROBAXIN) 500 MG tablet    Sig: 2 (two) times daily as needed.    Refill:  0     Follow-up: No Follow-up on file.  Walker Kehr, MD

## 2015-10-19 ENCOUNTER — Telehealth: Payer: Self-pay | Admitting: Internal Medicine

## 2015-10-19 NOTE — Telephone Encounter (Signed)
Albuterol if used a lot can cause cramps Thx

## 2015-10-19 NOTE — Telephone Encounter (Signed)
Pt called in and would like to know why she is having craps and pain in her legs.  She wants to know if this is the meds that is causing this   Best number [phone]

## 2015-10-21 ENCOUNTER — Other Ambulatory Visit: Payer: Self-pay | Admitting: Internal Medicine

## 2015-10-21 NOTE — Telephone Encounter (Signed)
Pt informed - she states she is not using Albuterol right now. I offered OV with another provider, but she declines at this time. She will call us back if needed.

## 2015-10-25 NOTE — Addendum Note (Signed)
Addended by: Cresenciano Lick on: 10/25/2015 07:52 AM   Modules accepted: Orders

## 2015-10-27 ENCOUNTER — Other Ambulatory Visit: Payer: Self-pay | Admitting: Gastroenterology

## 2015-11-02 ENCOUNTER — Ambulatory Visit (INDEPENDENT_AMBULATORY_CARE_PROVIDER_SITE_OTHER): Payer: Medicare Other | Admitting: Family

## 2015-11-04 ENCOUNTER — Ambulatory Visit
Admission: RE | Admit: 2015-11-04 | Discharge: 2015-11-04 | Disposition: A | Discharge status: Home / Self Care | Payer: Medicare Other | Source: Ambulatory Visit | Attending: Internal Medicine | Admitting: Internal Medicine

## 2015-11-04 DIAGNOSIS — N644 Mastodynia: Secondary | ICD-10-CM

## 2015-11-04 DIAGNOSIS — C50412 Malignant neoplasm of upper-outer quadrant of left female breast: Secondary | ICD-10-CM

## 2015-11-25 ENCOUNTER — Other Ambulatory Visit: Payer: Self-pay | Admitting: Internal Medicine

## 2015-12-07 ENCOUNTER — Telehealth: Payer: Self-pay | Admitting: *Deleted

## 2015-12-07 ENCOUNTER — Other Ambulatory Visit: Payer: Self-pay | Admitting: *Deleted

## 2015-12-07 MED ORDER — MECLIZINE HCL 25 MG PO TABS
25.0000 mg | ORAL_TABLET | Freq: Three times a day (TID) | ORAL | 1 refills | Status: DC | PRN
Start: 2015-12-07 — End: 2015-12-26

## 2015-12-07 NOTE — Telephone Encounter (Signed)
Pt left msg on triage stating MD had rx meclizine for her at one time for dizziness. Pt is wanting to get refill sent to her pharmacy. Med no longer on med list is this ok to send...[name]

## 2015-12-07 NOTE — Telephone Encounter (Signed)
OK. Thx

## 2015-12-07 NOTE — Telephone Encounter (Signed)
Notified pt rx sent to [facility]..lmb 

## 2015-12-12 MED ORDER — MECLIZINE HCL 25 MG PO TABS: 25.0000 mg | ORAL_TABLET | Freq: Three times a day (TID) | ORAL | 1 refills | Status: DC | PRN

## 2015-12-26 ENCOUNTER — Ambulatory Visit (INDEPENDENT_AMBULATORY_CARE_PROVIDER_SITE_OTHER): Payer: Medicare Other | Admitting: Gynecology

## 2015-12-26 ENCOUNTER — Encounter: Payer: Self-pay | Admitting: Gynecology

## 2015-12-26 VITALS — BP 130/80 | Ht 64.0 in | Wt 212.0 lb

## 2015-12-26 DIAGNOSIS — Z01411 Encounter for gynecological examination (general) (routine) with abnormal findings: Secondary | ICD-10-CM

## 2015-12-26 DIAGNOSIS — C50912 Malignant neoplasm of unspecified site of left female breast: Secondary | ICD-10-CM

## 2015-12-26 DIAGNOSIS — N952 Postmenopausal atrophic vaginitis: Secondary | ICD-10-CM

## 2015-12-26 NOTE — Patient Instructions (Signed)
follow up in one year for annual exam

## 2015-12-26 NOTE — Progress Notes (Signed)
    Deanna Salazar 1940-02-01 [id number]        75 y.o.  G5P5 for breast and pelvic exam  Past medical history,surgical history, problem list, medications, allergies, family history and social history were all reviewed and documented as reviewed in the EPIC chart.  ROS:  Performed with pertinent positives and negatives included in the history, assessment and plan.   Additional significant findings :   none   Exam:  Caryn Bee assistant Vitals:   12/26/15 1148  BP: 130/80  Weight: 212 lb (96.2 kg)  Height: 5\' 4"  (1.626 m)   Body mass index is 36.39 kg/m.  General appearance:  Normal affect, orientation and appearance. Skin: Grossly normal HEENT: Without gross lesions.  No cervical or supraclavicular adenopathy. Thyroid normal.  Lungs:  Clear without wheezing, rales or rhonchi Cardiac: RR, without RMG Abdominal:  Soft, nontender, without masses, guarding, rebound, organomegaly or hernia Breasts:  Examined lying and sitting without masses, retractions, discharge or axillary adenopathy.  Well-healed left lumpectomy site. Pelvic:  Ext, BUS, Vagina  with atrophic changes  Cervix  with atrophic changes  Uterus  anteverted, normal size, shape and contour, midline and mobile nontender   Adnexa without masses or tenderness    Anus and perineum normal   Rectovaginal normal sphincter tone without palpated masses or tenderness.    Assessment/Plan:  75 y.o. G73P5 female for breast and pelvic exam.   1. postmenopausal/atrophic genital changes. No significant hot flushes, night sweats, vaginal dryness or any vaginal bleeding. Continue to monitor report any issues or bleeding. 2. History of left breast cancer. Exam NED. Due for mammogram this coming January I reminded her to schedule this. Had most recent studies in October looking at area in her left breast which showed some scarring and fat necrosis at her lumpectomy site. SBE monthly reviewed. 3. Pap smear/HPV 2015 negative. No Pap smear  done today. History of ASCUS with negative high-risk HPV 2013. 4. Colonoscopy 2014. Repeat at their recommended interval. 5. DEXA 2015 normal. Repeat at 5 year interval. 6. Health maintenance. No routine lab work done as patient reports this done elsewhere. Follow up 1 year, sooner as needed.   Anastasio Auerbach MD, 12:24 PM 12/26/2015

## 2016-01-04 ENCOUNTER — Other Ambulatory Visit: Payer: Self-pay | Admitting: Internal Medicine

## 2016-01-04 DIAGNOSIS — N644 Mastodynia: Secondary | ICD-10-CM

## 2016-01-05 ENCOUNTER — Ambulatory Visit: Payer: Medicare Other | Admitting: Internal Medicine

## 2016-01-10 ENCOUNTER — Other Ambulatory Visit: Payer: Self-pay

## 2016-01-10 ENCOUNTER — Telehealth: Payer: Self-pay | Admitting: Internal Medicine

## 2016-01-10 DIAGNOSIS — Z853 Personal history of malignant neoplasm of breast: Secondary | ICD-10-CM

## 2016-01-10 DIAGNOSIS — N644 Mastodynia: Secondary | ICD-10-CM

## 2016-01-10 NOTE — Telephone Encounter (Signed)
Pt was scheduling her mammogram but she is having some pain in her left breast so they are needing [place] Left breast IMG 5531 and Bilateral Diag MM IMG 5535. Please advise

## 2016-01-11 NOTE — Telephone Encounter (Signed)
OK both Thx

## 2016-01-11 NOTE — Telephone Encounter (Signed)
Can you please put these orders in.  thanks

## 2016-01-13 ENCOUNTER — Encounter: Payer: Medicare Other | Admitting: Internal Medicine

## 2016-01-13 NOTE — Telephone Encounter (Signed)
Orders placed.

## 2016-02-03 ENCOUNTER — Telehealth: Payer: Self-pay | Admitting: Emergency Medicine

## 2016-02-03 NOTE — Telephone Encounter (Signed)
[facility] called and wants to know if Dr Camila Li can sign off on the orders in Epic. Please advise thanks.

## 2016-02-06 ENCOUNTER — Ambulatory Visit
Admission: RE | Admit: 2016-02-06 | Discharge: 2016-02-06 | Disposition: A | Discharge status: Home / Self Care | Payer: PPO | Source: Ambulatory Visit | Attending: Internal Medicine | Admitting: Internal Medicine

## 2016-02-06 ENCOUNTER — Other Ambulatory Visit: Payer: Medicare Other

## 2016-02-06 DIAGNOSIS — N6489 Other specified disorders of breast: Secondary | ICD-10-CM | POA: Diagnosis not present

## 2016-02-06 DIAGNOSIS — R928 Other abnormal and inconclusive findings on diagnostic imaging of breast: Secondary | ICD-10-CM | POA: Diagnosis not present

## 2016-02-06 DIAGNOSIS — Z853 Personal history of malignant neoplasm of breast: Secondary | ICD-10-CM

## 2016-02-06 DIAGNOSIS — N644 Mastodynia: Secondary | ICD-10-CM

## 2016-02-07 NOTE — Telephone Encounter (Signed)
done

## 2016-02-09 ENCOUNTER — Encounter: Payer: Self-pay | Admitting: Internal Medicine

## 2016-02-09 ENCOUNTER — Ambulatory Visit (INDEPENDENT_AMBULATORY_CARE_PROVIDER_SITE_OTHER): Payer: PPO | Admitting: Internal Medicine

## 2016-02-09 ENCOUNTER — Ambulatory Visit (INDEPENDENT_AMBULATORY_CARE_PROVIDER_SITE_OTHER)
Admission: RE | Admit: 2016-02-09 | Discharge: 2016-02-09 | Disposition: A | Discharge status: Home / Self Care | Payer: PPO | Source: Ambulatory Visit | Attending: Internal Medicine | Admitting: Internal Medicine

## 2016-02-09 VITALS — BP 128/70 | HR 65 | Temp 98.2°F | Resp 14 | Ht 64.0 in | Wt 212.8 lb

## 2016-02-09 DIAGNOSIS — R079 Chest pain, unspecified: Secondary | ICD-10-CM

## 2016-02-09 DIAGNOSIS — R0989 Other specified symptoms and signs involving the circulatory and respiratory systems: Secondary | ICD-10-CM

## 2016-02-09 DIAGNOSIS — K219 Gastro-esophageal reflux disease without esophagitis: Secondary | ICD-10-CM | POA: Diagnosis not present

## 2016-02-09 NOTE — Progress Notes (Signed)
Pre visit review using our clinic review tool, if applicable. No additional management support is needed unless otherwise documented below in the visit note. 

## 2016-02-09 NOTE — Assessment & Plan Note (Signed)
GI ref - ?esoph stricture relapse EKG, CXR

## 2016-02-09 NOTE — Progress Notes (Signed)
Subjective:  Patient ID: Deanna Salazar, female    DOB: Nov 28, 1940  Age: 76 y.o. MRN: [id number]  CC: No chief complaint on file.   HPI [employer] presents for a chest rattling episode x 2 days a few times. No CP.   Outpatient Medications Prior to Visit  Medication Sig Dispense Refill  . acetaminophen (TYLENOL) 325 MG tablet Take 650 mg by mouth as needed for headache.     Marland Kitchen amLODipine (NORVASC) 5 MG tablet take 1 tablet by mouth once daily 90 tablet 2  . aspirin (ASPIRIN CHILDRENS) 81 MG chewable tablet Chew 1 tablet (81 mg total) by mouth daily. 36 tablet 11  . carvedilol (COREG) 12.5 MG tablet Take 1 tablet (12.5 mg total) by mouth 2 (two) times daily with a meal. 180 tablet 3  . Cholecalciferol (VITAMIN D3) 1000 UNITS CAPS Take 1,000 Units by mouth daily.     . furosemide (LASIX) 20 MG tablet Take 1 tablet (20 mg total) by mouth daily. 90 tablet 3  . levothyroxine (SYNTHROID, LEVOTHROID) 25 MCG tablet Take 1 tablet (25 mcg total) by mouth daily before breakfast. 90 tablet 3  . losartan (COZAAR) 100 MG tablet Take 1 tablet (100 mg total) by mouth daily. 90 tablet 3  . meclizine (ANTIVERT) 25 MG tablet Take 1 tablet (25 mg total) by mouth 3 (three) times daily as needed for dizziness or nausea. 30 tablet 1  . pantoprazole (PROTONIX) 40 MG tablet take 1 tablet by mouth twice a day 180 tablet 0  . potassium chloride (K-DUR) 10 MEQ tablet take 1 tablet by mouth once daily 90 tablet 3  . albuterol (PROVENTIL HFA;VENTOLIN HFA) 108 (90 Base) MCG/ACT inhaler Inhale 2 puffs into the lungs every 6 (six) hours as needed for wheezing or shortness of breath. (Patient not taking: Reported on 02/09/2016) 1 Inhaler 0  . fluticasone (FLONASE) 50 MCG/ACT nasal spray Place 2 sprays into both nostrils daily. (Patient not taking: Reported on 02/09/2016) 16 g 6  . ketoconazole (NIZORAL) 2 % cream Apply 1 application topically 2 (two) times daily. (Patient not taking: Reported on 02/09/2016) 45 g 1  .  loratadine (CLARITIN) 10 MG tablet Take 1 tablet (10 mg total) by mouth daily. (Patient not taking: Reported on 02/09/2016) 100 tablet 3   No facility-administered medications prior to visit.     ROS Review of Systems  Constitutional: Negative for activity change, appetite change, chills, fatigue and unexpected weight change.  HENT: Negative for congestion, mouth sores and sinus pressure.   Eyes: Negative for visual disturbance.  Respiratory: Positive for chest tightness and wheezing. Negative for cough.   Cardiovascular: Positive for chest pain.  Gastrointestinal: Negative for abdominal pain and nausea.  Genitourinary: Negative for difficulty urinating, frequency and vaginal pain.  Musculoskeletal: Negative for back pain and gait problem.  Skin: Negative for pallor and rash.  Neurological: Negative for dizziness, tremors, weakness, numbness and headaches.  Psychiatric/Behavioral: Negative for confusion and sleep disturbance.    Objective:  BP 128/70   Pulse 65   Temp 98.2 F (36.8 C) (Oral)   Resp 14   Ht 5\' 4"  (1.626 m)   Wt 212 lb 12.8 oz (96.5 kg)   SpO2 98%   BMI 36.53 kg/m   BP Readings from Last 3 Encounters:  02/09/16 128/70  12/26/15 130/80  10/04/15 138/80    Wt Readings from Last 3 Encounters:  02/09/16 212 lb 12.8 oz (96.5 kg)  12/26/15 212 lb (96.2 kg)  10/04/15 208 lb (94.3 kg)    Physical Exam  Constitutional: She appears well-developed. No distress.  HENT:  Head: Normocephalic.  Right Ear: External ear normal.  Left Ear: External ear normal.  Nose: Nose normal.  Mouth/Throat: Oropharynx is clear and moist.  Eyes: Conjunctivae are normal. Pupils are equal, round, and reactive to light. Right eye exhibits no discharge. Left eye exhibits no discharge.  Neck: Normal range of motion. Neck supple. No JVD present. No tracheal deviation present. No thyromegaly present.  Cardiovascular: Normal rate, regular rhythm and normal heart sounds.   Pulmonary/Chest:  No stridor. No respiratory distress. She has no wheezes.  Abdominal: Soft. Bowel sounds are normal. She exhibits no distension and no mass. There is no tenderness. There is no rebound and no guarding.  Musculoskeletal: She exhibits no edema or tenderness.  Lymphadenopathy:    She has no cervical adenopathy.  Neurological: She displays normal reflexes. No cranial nerve deficit. She exhibits normal muscle tone. Coordination normal.  Skin: No rash noted. No erythema.  Psychiatric: She has a normal mood and affect. Her behavior is normal. Judgment and thought content normal.  Obese  Procedure: EKG Indication: chest pain Impression: S brady. LAFB.Marland Kitchen No new changes from before.   Lab Results  Component Value Date   WBC 6.2 12/06/2014   HGB 12.2 12/06/2014   HCT 37.4 12/06/2014   PLT 220.0 12/06/2014   GLUCOSE 115 (H) 10/04/2015   CHOL 198 07/14/2013   TRIG 82.0 07/14/2013   HDL 46.50 07/14/2013   LDLDIRECT 180.9 07/26/2009   LDLCALC 135 (H) 07/14/2013   ALT 10 10/04/2015   AST 18 10/04/2015   NA 142 10/04/2015   K 4.2 10/04/2015   CL 104 10/04/2015   CREATININE 1.04 10/04/2015   BUN 8 10/04/2015   CO2 32 10/04/2015   TSH 1.94 12/06/2014   HGBA1C 6.5 10/04/2015   MICROALBUR <0.7 10/04/2015    US Breast Ltd Uni Left Inc Axilla  Result Date: 02/06/2016 CLINICAL DATA:  The patient reports thickening in the upper-outer quadrant of the left breast between surgical scars. History of left lumpectomy with radiation therapy in 2009. EXAM: 2D DIGITAL DIAGNOSTIC BILATERAL MAMMOGRAM WITH CAD AND ADJUNCT TOMO ULTRASOUND LEFT BREAST COMPARISON:  Prior studies including 02/03/2015 ACR Breast Density Category b: There are scattered areas of fibroglandular density. FINDINGS: Post operative changes are seen in the leftbreast. An area of possible distortion is further evaluated with spot tomosynthesis view in the medial portion of the right breast, demonstrating no persistent abnormality. Mammographic  images were processed with CAD. On physical exam, I palpate focal thickening between the scars in the upper-outer quadrant of the left breast. I palpate no discreet mass in this region. Targeted ultrasound is performed, showing normal appearing fibroglandular tissue in the area concern in the upper-outer quadrant of the left breast. Surgical changes are identified at the scar site. IMPRESSION: No mammographic or ultrasound evidence for malignancy. RECOMMENDATION: Screening mammogram in one year.(Code:SM-B-01Y) I have discussed the findings and recommendations with the patient. Results were also provided in writing at the conclusion of the visit. If applicable, a reminder letter will be sent to the patient regarding the next appointment. BI-RADS CATEGORY  2: Benign. Electronically Signed   By: Nolon Nations M.D.   On: 02/06/2016 13:00   Mm Diag Breast Tomo Bilateral  Result Date: 02/06/2016 CLINICAL DATA:  The patient reports thickening in the upper-outer quadrant of the left breast between surgical scars. History of left lumpectomy with radiation therapy  in 2009. EXAM: 2D DIGITAL DIAGNOSTIC BILATERAL MAMMOGRAM WITH CAD AND ADJUNCT TOMO ULTRASOUND LEFT BREAST COMPARISON:  Prior studies including 02/03/2015 ACR Breast Density Category b: There are scattered areas of fibroglandular density. FINDINGS: Post operative changes are seen in the leftbreast. An area of possible distortion is further evaluated with spot tomosynthesis view in the medial portion of the right breast, demonstrating no persistent abnormality. Mammographic images were processed with CAD. On physical exam, I palpate focal thickening between the scars in the upper-outer quadrant of the left breast. I palpate no discreet mass in this region. Targeted ultrasound is performed, showing normal appearing fibroglandular tissue in the area concern in the upper-outer quadrant of the left breast. Surgical changes are identified at the scar site.  IMPRESSION: No mammographic or ultrasound evidence for malignancy. RECOMMENDATION: Screening mammogram in one year.(Code:SM-B-01Y) I have discussed the findings and recommendations with the patient. Results were also provided in writing at the conclusion of the visit. If applicable, a reminder letter will be sent to the patient regarding the next appointment. BI-RADS CATEGORY  2: Benign. Electronically Signed   By: Nolon Nations M.D.   On: 02/06/2016 13:00    Assessment & Plan:   There are no diagnoses linked to this encounter. I am having Ms. Boal maintain her acetaminophen, Vitamin D3, aspirin, carvedilol, albuterol, furosemide, levothyroxine, loratadine, fluticasone, losartan, amLODipine, ketoconazole, pantoprazole, potassium chloride, and meclizine.  No orders of the defined types were placed in this encounter.    Follow-up: No Follow-up on file.  Walker Kehr, MD

## 2016-02-09 NOTE — Assessment & Plan Note (Signed)
  GERD could have caused her chest sx's EKG CXR  Protonix GI ref - ?esoph stricture relapse

## 2016-02-09 NOTE — Assessment & Plan Note (Signed)
On Protonix GERD could have caused her chest sx's

## 2016-02-22 ENCOUNTER — Telehealth: Payer: Self-pay | Admitting: Physician Assistant

## 2016-02-22 ENCOUNTER — Encounter: Payer: Self-pay | Admitting: Gastroenterology

## 2016-02-22 ENCOUNTER — Telehealth: Payer: Self-pay

## 2016-02-22 ENCOUNTER — Ambulatory Visit (INDEPENDENT_AMBULATORY_CARE_PROVIDER_SITE_OTHER): Payer: PPO | Admitting: Physician Assistant

## 2016-02-22 ENCOUNTER — Encounter: Payer: Self-pay | Admitting: Physician Assistant

## 2016-02-22 VITALS — BP 136/72 | HR 84 | Ht 63.0 in | Wt 211.0 lb

## 2016-02-22 DIAGNOSIS — R1310 Dysphagia, unspecified: Secondary | ICD-10-CM | POA: Diagnosis not present

## 2016-02-22 DIAGNOSIS — K219 Gastro-esophageal reflux disease without esophagitis: Secondary | ICD-10-CM | POA: Diagnosis not present

## 2016-02-22 MED ORDER — ESOMEPRAZOLE MAGNESIUM 40 MG PO CPDR: 40.0000 mg | DELAYED_RELEASE_CAPSULE | Freq: Two times a day (BID) | ORAL | 5 refills | Status: DC

## 2016-02-22 NOTE — Telephone Encounter (Signed)
-----   Message from Levin Erp, [place] sent at 02/22/2016 10:53 AM EST ----- Regarding: Please schedule Barium esophagram Per Dr. Fuller Plan he would like Barium esophagram with tablet scheduled prior to EGD.  Thanks-JLL ----- Message ----- From: Ladene Artist, MD Sent: 02/22/2016  10:35 AM To: Levin Erp, PA    ----- Message ----- From: Levin Erp, PA Sent: 02/22/2016  10:33 AM To: Ladene Artist, MD

## 2016-02-22 NOTE — Progress Notes (Signed)
Chief Complaint: Heartburn and dysphagia  HPI:  Deanna Salazar is a 76 year old African-American female with a past medical history of adenomatous colon polyps, back pain, breast cancer, constipation, fatigue, fatty liver, fibromyalgia, GERD, IBS and multiple others listed below, who was referred to me by Plotnikov, Evie Lacks, MD for a complaint of dysphagia and heartburn.  Patient has followed with Dr. Fuller Plan in the past.   Patient's last colonoscopy was performed 06/12/12 by Dr. Fuller Plan with finding of 2 sessile polyps in the transverse colon, 2 sessile polyps in the sigmoid colon, and was otherwise normal. Patient was told to repeat in 5 years. Patient's last EGD with dilation was performed 02/11/08 with findings of a normal EGD and reflux. Patient also had a normal esophageal manometry study on 04/13/09.   Today, the patient describes that she has a sensation of food getting stuck on its way down her throat. She tells me that if she swallows more than a normal amount of food it seems to stop at the top of her throat and then "ease on down" with time. The patient does take sips of water during this time to help it, this has been increasing over the past few months. Patient tells me this has been accompanied by an increase in a "burning sensation in my throat". The patient tells me that this will last a "good minute". She describes that she does not feel that her Pantoprazole 40 mg twice a day is helping anymore for her reflux.   Patient denies fever, chills, blood in her stool, melena, change in bowel habits, weight loss, fatigue, anorexia, nausea, vomiting or abdominal pain.  Past Medical History:  Diagnosis Date  . Abdominal pain, generalized 02/05/2008  . ABDOMINAL PAIN-EPIGASTRIC 03/24/2009  . Acute bronchitis 02/15/2007  . Adenomatous colon polyp 04/2000  . ASCUS favor benign 10/2011   negative high risk HPV screen  . BACK PAIN 07/20/2008  . Breast cancer (HCC)    l, hx of XRT Dr Benay Spice, Left side  .  BREAST CANCER, HX OF 04/16/2007  . CHEST PAIN 02/23/2010  . COLONIC POLYPS, ADENOMATOUS, HX OF 10/01/2007  . CONSTIPATION, CHRONIC 05/10/2007  . Dysuria 04/06/2008  . FATIGUE 12/26/2006  . Fatty liver   . FATTY LIVER DISEASE 11/19/2006  . FIBROMYALGIA 11/19/2006  . GERD 11/19/2006   Dr Fuller Plan  . HEMATOCHEZIA 05/10/2007  . HEMORRHOIDS, INTERNAL 05/10/2007  . HIP PAIN 07/20/2008  . HYPERLIPIDEMIA 07/26/2009  . HYPERTENSION 12/26/2006  . HYPOKALEMIA 11/25/2007  . HYPOTHYROIDISM 12/26/2006  . IBS 11/19/2006  . INSOMNIA, PERSISTENT 12/26/2006  . KELOID 07/25/2007  . MURMUR 07/25/2007  . Nausea alone 03/23/2010  . PARESTHESIA 04/06/2008  . RENAL CYST, LEFT 02/23/2010  . Rheumatoid arthritis(714.0) 11/19/2006   Dr Vianne Bulls  . RUQ PAIN 11/07/2009  . Tubular adenoma   . VISION IMPAIRMENT, LOW VISION, ONE EYE-LEFT 07/25/2007    Past Surgical History:  Procedure Laterality Date  . BREAST SURGERY  2009   Left lumpectomy  . Cyst excision from chest    . knee surgery Left   . Left thyroidectomy    . OS Cataract extraction  2009  . OVARIAN CYST REMOVAL  age 71  . SHOULDER SURGERY     both shoulders  . TUBAL LIGATION      Current Outpatient Prescriptions  Medication Sig Dispense Refill  . acetaminophen (TYLENOL) 325 MG tablet Take 650 mg by mouth as needed for headache.     . albuterol (PROVENTIL HFA;VENTOLIN HFA) 108 (90  Base) MCG/ACT inhaler Inhale 2 puffs into the lungs every 6 (six) hours as needed for wheezing or shortness of breath. 1 Inhaler 0  . amLODipine (NORVASC) 5 MG tablet take 1 tablet by mouth once daily 90 tablet 2  . aspirin (ASPIRIN CHILDRENS) 81 MG chewable tablet Chew 1 tablet (81 mg total) by mouth daily. 36 tablet 11  . carvedilol (COREG) 12.5 MG tablet Take 1 tablet (12.5 mg total) by mouth 2 (two) times daily with a meal. 180 tablet 3  . Cholecalciferol (VITAMIN D3) 1000 UNITS CAPS Take 1,000 Units by mouth daily.     . fluticasone (FLONASE) 50 MCG/ACT nasal spray Place 2  sprays into both nostrils daily. 16 g 6  . furosemide (LASIX) 20 MG tablet Take 1 tablet (20 mg total) by mouth daily. 90 tablet 3  . ketoconazole (NIZORAL) 2 % cream Apply 1 application topically 2 (two) times daily. 45 g 1  . levothyroxine (SYNTHROID, LEVOTHROID) 25 MCG tablet Take 1 tablet (25 mcg total) by mouth daily before breakfast. 90 tablet 3  . loratadine (CLARITIN) 10 MG tablet Take 1 tablet (10 mg total) by mouth daily. 100 tablet 3  . losartan (COZAAR) 100 MG tablet Take 1 tablet (100 mg total) by mouth daily. 90 tablet 3  . meclizine (ANTIVERT) 25 MG tablet Take 1 tablet (25 mg total) by mouth 3 (three) times daily as needed for dizziness or nausea. 30 tablet 1  . pantoprazole (PROTONIX) 40 MG tablet take 1 tablet by mouth twice a day 180 tablet 0  . potassium chloride (K-DUR) 10 MEQ tablet take 1 tablet by mouth once daily 90 tablet 3  . esomeprazole (NEXIUM) 40 MG capsule Take 1 capsule (40 mg total) by mouth 2 (two) times daily before a meal. 60 capsule 5   No current facility-administered medications for this visit.     Allergies as of 02/22/2016 - Review Complete 02/22/2016  Allergen Reaction Noted  . Codeine  07/20/2008  . Hydrocodone  07/20/2008  . Lovastatin Itching 10/06/2010  . Tramadol hcl  08/19/2009    Family History  Problem Relation Age of Onset  . Lung cancer Brother   . Colon cancer Brother     in his early 85's  . Diabetes Father   . Brain cancer Brother   . Stroke Sister     Social History   Social History  . Marital status: Married    Spouse name: N/A  . Number of children: 5  . Years of education: N/A   Occupational History  . retired Retired   Social History Main Topics  . Smoking status: Never Smoker  . Smokeless tobacco: Never Used  . Alcohol use No  . Drug use: No  . Sexual activity: No     Comment: 1st intercourse 76 yo-Fewer than 5 partners   Other Topics Concern  . Not on file   Social History Narrative   Patient does  not get regular exercise    Review of Systems:    Constitutional: No weight loss, fever or chills Skin: No rash  Cardiovascular: No chest pain  Respiratory: No SOB Gastrointestinal: See HPI and otherwise negative Genitourinary: No dysuria or change in urinary frequency Neurological: No headache Musculoskeletal: No new muscle or joint pain Hematologic: No bleeding or bruising Psychiatric: No history of depression or anxiety   Physical Exam:  Vital signs: BP 136/72   Pulse 84   Ht 5\' 3"  (1.6 m)   Wt 211 lb (95.7  kg)   BMI 37.38 kg/m   Constitutional:   Pleasant African-American female appears to be in NAD, Well developed, Well nourished, alert and cooperative Head:  Normocephalic and atraumatic. Eyes:   PEERL, EOMI. No icterus. Conjunctiva pink. Ears:  Normal auditory acuity. Neck:  Supple Throat: Oral cavity and pharynx without inflammation, swelling or lesion.  Respiratory: Respirations even and unlabored. Lungs clear to auscultation bilaterally.   No wheezes, crackles, or rhonchi.  Cardiovascular: Normal S1, S2. No MRG. Regular rate and rhythm. No peripheral edema, cyanosis or pallor.  Gastrointestinal:  Soft, nondistended, nontender. No rebound or guarding. Normal bowel sounds. No appreciable masses or hepatomegaly. Rectal:  Not performed.  Msk:  Symmetrical without gross deformities. Without edema, no deformity or joint abnormality.  Neurologic:  Alert and  oriented x4;  grossly normal neurologically.  Skin:   Dry and intact without significant lesions or rashes. Psychiatric:  Demonstrates good judgement and reason without abnormal affect or behaviors.  No recent labs or imaging.  Assessment: 1. Dysphagia: Increasing over the past 4-6 months, patient does describe food slowing at the top of her throat and then gradually going down, she feels it "release"; Consider esophageal ring versus web or stricture versus mass versus abnormal motility versus other 2. GERD: Increased  over the past 4-6 months regardless of Pantoprazole 40 mg twice a day likely contributing to above  Plan: 1. Change patient from Pantoprazole to Esomeprazole 40 mg twice a day, 30-60 minutes before eating breakfast and dinner #60 with 5 refills, did tell the patient to call if medicine is too expensive and we can try AcipHex/lansoprazole 30 mg twice a day 2. Scheduled EGD with dilation with Dr. Fuller Plan in the [facility]. Did discuss risks, benefits, limitations and alternatives of the patient agrees to proceed 3. Reviewed anti-dysphagia measures including taking small bites, drinking sips of water between bites, avoiding distraction while eating and the chin tuck technique 4. Reviewed antireflux diet and lifestyle modifications, provided the patient with a handout. Patient to follow in clinic per Dr. Lynne Leader recommendations after time of procedure.  Ellouise Newer, PA-C [facility] 02/22/2016, 10:23 AM  Cc: Cassandria Anger, MD

## 2016-02-22 NOTE — Patient Instructions (Signed)
You have been scheduled for an endoscopy. Please follow written instructions given to you at your visit today. If you use inhalers (even only as needed), please bring them with you on the day of your procedure. Your physician has requested that you go to www.startemmi.com and enter the access code given to you at your visit today. This web site gives a general overview about your procedure. However, you should still follow specific instructions given to you by our office regarding your preparation for the procedure.  We have sent the following medications to your pharmacy for you to pick up at your convenience: Esomeprazole 40 mg twice a day  Stop Pantoprazole.  We have given you an antireflux handout.

## 2016-02-22 NOTE — Progress Notes (Signed)
Reviewed and agree with initial management plan. Please also schedule barium esophagram with tablet.   Pricilla Riffle. Fuller Plan, MD [facility]

## 2016-02-22 NOTE — Telephone Encounter (Signed)
Spoke to patient she states the pharmacist told her that generic was too expensive for her and that she should just get it over the counter. Patient wanted to make sure this is ok and if not is there something else she can take?

## 2016-02-22 NOTE — Telephone Encounter (Signed)
Ladene Artist, MD at 02/22/2016 9:15 AM   Status: Signed    Reviewed and agree with initial management plan. Please also schedule barium esophagram with tablet.   Pricilla Riffle. Fuller Plan, MD [facility]

## 2016-02-22 NOTE — Telephone Encounter (Signed)
You have been scheduled for a Barium Esophogram at [facility] (1st floor of the hospital) on 02/24/16 at 130 pm. Please arrive 15 minutes prior to your appointment for registration. Make certain not to have anything to eat or drink 6 hours prior to your test. If you need to reschedule for any reason, please contact radiology at [phone] to do so. _______________________________________________________ A barium swallow is an examination that concentrates on views of the esophagus. This tends to be a double contrast exam (barium and two liquids which, when combined, create a gas to distend the wall of the oesophagus) or single contrast (non-ionic iodine based). The study is usually tailored to your symptoms so a good history is essential. Attention is paid during the study to the form, structure and configuration of the esophagus, looking for functional disorders (such as aspiration, dysphagia, achalasia, motility and reflux) EXAMINATION You may be asked to change into a gown, depending on the type of swallow being performed. A radiologist and radiographer will perform the procedure. The radiologist will advise you of the type of contrast selected for your procedure and direct you during the exam. You will be asked to stand, sit or lie in several different positions and to hold a small amount of fluid in your mouth before being asked to swallow while the imaging is performed .In some instances you may be asked to swallow barium coated marshmallows to assess the motility of a solid food bolus. The exam can be recorded as a digital or video fluoroscopy procedure. POST PROCEDURE It will take 1-2 days for the barium to pass through your system. To facilitate this, it is important, unless otherwise directed, to increase your fluids for the next 24-48hrs and to resume your normal diet.  This test typically takes about 30 minutes to  perform. _______________________________________________________  The patient has been notified of this information and all questions answered.

## 2016-02-23 MED ORDER — LANSOPRAZOLE 30 MG PO CPDR: 30.0000 mg | DELAYED_RELEASE_CAPSULE | Freq: Every day | ORAL | 3 refills | Status: DC

## 2016-02-23 NOTE — Telephone Encounter (Signed)
Left detailed message on patient's voicemail per her request.

## 2016-02-23 NOTE — Telephone Encounter (Signed)
If she were to buy OTC she would need to take 2 tabs once daily. Let's try to send in Lansoprazole 30mg  qd instead. Thanks-JLL

## 2016-02-24 ENCOUNTER — Other Ambulatory Visit: Payer: Self-pay | Admitting: Internal Medicine

## 2016-02-24 ENCOUNTER — Ambulatory Visit (HOSPITAL_COMMUNITY)
Admission: RE | Admit: 2016-02-24 | Discharge: 2016-02-24 | Disposition: A | Discharge status: Home / Self Care | Payer: PPO | Source: Ambulatory Visit | Attending: Gastroenterology | Admitting: Gastroenterology

## 2016-02-24 DIAGNOSIS — R1310 Dysphagia, unspecified: Secondary | ICD-10-CM | POA: Diagnosis not present

## 2016-02-28 ENCOUNTER — Encounter: Payer: Self-pay | Admitting: Gastroenterology

## 2016-02-28 ENCOUNTER — Ambulatory Visit (AMBULATORY_SURGERY_CENTER): Payer: PPO | Admitting: Gastroenterology

## 2016-02-28 VITALS — BP 144/58 | HR 60 | Temp 97.8°F | Resp 15 | Ht 63.0 in | Wt 211.0 lb

## 2016-02-28 DIAGNOSIS — K219 Gastro-esophageal reflux disease without esophagitis: Secondary | ICD-10-CM | POA: Diagnosis not present

## 2016-02-28 DIAGNOSIS — E119 Type 2 diabetes mellitus without complications: Secondary | ICD-10-CM | POA: Diagnosis not present

## 2016-02-28 DIAGNOSIS — R1310 Dysphagia, unspecified: Secondary | ICD-10-CM

## 2016-02-28 MED ORDER — SODIUM CHLORIDE 0.9 % IV SOLN: 500.0000 mL | INTRAVENOUS | Status: DC

## 2016-02-28 NOTE — Progress Notes (Signed)
Called to room to assist during endoscopic procedure.  Patient ID and intended procedure confirmed with present staff. Received instructions for my participation in the procedure from the performing physician.  

## 2016-02-28 NOTE — Op Note (Signed)
[place] Patient Name: Deanna Salazar Procedure Date: 02/28/2016 8:54 AM MRN: [id number] Endoscopist: Ladene Artist , MD Age: 76 Referring MD:  Date of Birth: [birth year] Gender: Female Account #: [id number] Procedure:                Upper GI endoscopy Indications:              Dysphagia Medicines:                Monitored Anesthesia Care Procedure:                Pre-Anesthesia Assessment:                           - Prior to the procedure, a History and Physical                            was performed, and patient medications and                            allergies were reviewed. The patient's tolerance of                            previous anesthesia was also reviewed. The risks                            and benefits of the procedure and the sedation                            options and risks were discussed with the patient.                            All questions were answered, and informed consent                            was obtained. Prior Anticoagulants: The patient has                            taken no previous anticoagulant or antiplatelet                            agents. ASA Grade Assessment: III - A patient with                            severe systemic disease. After reviewing the risks                            and benefits, the patient was deemed in                            satisfactory condition to undergo the procedure.                           After obtaining informed consent, the endoscope was  passed under direct vision. Throughout the                            procedure, the patient's blood pressure, pulse, and                            oxygen saturations were monitored continuously. The                            Model GIF-HQ190 [id number]) scope was introduced                            through the mouth, and advanced to the second part                            of duodenum. The upper GI endoscopy was                             accomplished without difficulty. The patient                            tolerated the procedure well. Scope In: Scope Out: Findings:                 No endoscopic abnormality was evident in the                            esophagus to explain the patient's complaint of                            dysphagia. It was decided, however, to proceed with                            dilation of the entire esophagus. A guidewire was                            placed and the scope was withdrawn. Dilation was                            performed with a Savary dilator with no resistance                            at 16 mm.                           The entire examined stomach was normal.                           The duodenal bulb and second portion of the                            duodenum were normal. Complications:            No immediate complications. Estimated Blood Loss:     Estimated blood loss was minimal. Impression:               -  No endoscopic esophageal abnormality to explain                            patient's dysphagia. Esophagus dilated. Dilated.                           - Normal stomach.                           - Normal duodenal bulb and second portion of the                            duodenum.                           - No specimens collected. Recommendation:           - Patient has a contact number available for                            emergencies. The signs and symptoms of potential                            delayed complications were discussed with the                            patient. Return to normal activities tomorrow.                            Written discharge instructions were provided to the                            patient.                           - Clear liquid diet for 2 hours, then advance as                            tolerated to soft diet today. Resume prior diet                            tomorrow.                            - Continue present medications.                           - GI follow up in 1 year. Ladene Artist, MD 02/28/2016 9:21:36 AM This report has been signed electronically.

## 2016-02-28 NOTE — Progress Notes (Signed)
Report given to PACU, vss 

## 2016-02-28 NOTE — Patient Instructions (Signed)
Discharge instructions given. Handout on a dilatation diet. Resume previous medications. YOU HAD AN ENDOSCOPIC PROCEDURE TODAY AT THE [facility]:   Refer to the procedure report that was given to you for any specific questions about what was found during the examination.  If the procedure report does not answer your questions, please call your gastroenterologist to clarify.  If you requested that your care partner not be given the details of your procedure findings, then the procedure report has been included in a sealed envelope for you to review at your convenience later.  YOU SHOULD EXPECT: Some feelings of bloating in the abdomen. Passage of more gas than usual.  Walking can help get rid of the air that was put into your GI tract during the procedure and reduce the bloating. If you had a lower endoscopy (such as a colonoscopy or flexible sigmoidoscopy) you may notice spotting of blood in your stool or on the toilet paper. If you underwent a bowel prep for your procedure, you may not have a normal bowel movement for a few days.  Please Note:  You might notice some irritation and congestion in your nose or some drainage.  This is from the oxygen used during your procedure.  There is no need for concern and it should clear up in a day or so.  SYMPTOMS TO REPORT IMMEDIATELY:    Following upper endoscopy (EGD)  Vomiting of blood or coffee ground material  New chest pain or pain under the shoulder blades  Painful or persistently difficult swallowing  New shortness of breath  Fever of 100F or higher  Black, tarry-looking stools  For urgent or emergent issues, a gastroenterologist can be reached at any hour by calling [phone].   DIET:  We do recommend a small meal at first, but then you may proceed to your regular diet.  Drink plenty of fluids but you should avoid alcoholic beverages for 24 hours.  ACTIVITY:  You should plan to take it easy for the rest of today and you  should NOT DRIVE or use heavy machinery until tomorrow (because of the sedation medicines used during the test).    FOLLOW UP: Our staff will call the number listed on your records the next business day following your procedure to check on you and address any questions or concerns that you may have regarding the information given to you following your procedure. If we do not reach you, we will leave a message.  However, if you are feeling well and you are not experiencing any problems, there is no need to return our call.  We will assume that you have returned to your regular daily activities without incident.  If any biopsies were taken you will be contacted by phone or by letter within the next 1-3 weeks.  Please call us at [phone] if you have not heard about the biopsies in 3 weeks.    SIGNATURES/CONFIDENTIALITY: You and/or your care partner have signed paperwork which will be entered into your electronic medical record.  These signatures attest to the fact that that the information above on your After Visit Summary has been reviewed and is understood.  Full responsibility of the confidentiality of this discharge information lies with you and/or your care-partner. 

## 2016-02-29 ENCOUNTER — Telehealth: Payer: Self-pay

## 2016-02-29 NOTE — Telephone Encounter (Signed)
  Follow up Call-  Call back number 02/28/2016  Post procedure Call Back phone  # [phone]  Permission to leave phone message Yes  Some recent data might be hidden     Patient questions:  Do you have a fever, pain , or abdominal swelling? No. Pain Score  0 *  Have you tolerated food without any problems? Yes.    Have you been able to return to your normal activities? Yes.    Do you have any questions about your discharge instructions: Diet   No. Medications  No. Follow up visit  No.  Do you have questions or concerns about your Care? No.  Actions: * If pain score is 4 or above: No action needed, pain <4.  Pt reported no problems. maw

## 2016-04-01 ENCOUNTER — Other Ambulatory Visit: Payer: Self-pay | Admitting: Internal Medicine

## 2016-04-18 ENCOUNTER — Ambulatory Visit: Payer: PPO | Admitting: Internal Medicine

## 2016-05-08 ENCOUNTER — Telehealth: Payer: Self-pay | Admitting: Internal Medicine

## 2016-05-08 NOTE — Telephone Encounter (Signed)
Called patient to schedule awv. Left msg for patient to call office to schedule appt.  °

## 2016-05-16 ENCOUNTER — Encounter: Payer: Self-pay | Admitting: Internal Medicine

## 2016-05-16 ENCOUNTER — Other Ambulatory Visit (INDEPENDENT_AMBULATORY_CARE_PROVIDER_SITE_OTHER): Payer: PPO

## 2016-05-16 ENCOUNTER — Ambulatory Visit (INDEPENDENT_AMBULATORY_CARE_PROVIDER_SITE_OTHER): Payer: PPO | Admitting: Internal Medicine

## 2016-05-16 DIAGNOSIS — K219 Gastro-esophageal reflux disease without esophagitis: Secondary | ICD-10-CM | POA: Diagnosis not present

## 2016-05-16 DIAGNOSIS — E876 Hypokalemia: Secondary | ICD-10-CM

## 2016-05-16 DIAGNOSIS — E119 Type 2 diabetes mellitus without complications: Secondary | ICD-10-CM

## 2016-05-16 DIAGNOSIS — I10 Essential (primary) hypertension: Secondary | ICD-10-CM | POA: Diagnosis not present

## 2016-05-16 LAB — CBC WITH DIFFERENTIAL/PLATELET
BASOS ABS: 0.1 10*3/uL (ref 0.0–0.1)
BASOS PCT: 1.1 % (ref 0.0–3.0)
EOS ABS: 0.3 10*3/uL (ref 0.0–0.7)
Eosinophils Relative: 4.3 % (ref 0.0–5.0)
HEMATOCRIT: 38.3 % (ref 36.0–46.0)
HEMOGLOBIN: 12.6 g/dL (ref 12.0–15.0)
LYMPHS PCT: 29.7 % (ref 12.0–46.0)
Lymphs Abs: 1.9 10*3/uL (ref 0.7–4.0)
MCHC: 32.8 g/dL (ref 30.0–36.0)
MCV: 88.8 fl (ref 78.0–100.0)
Monocytes Absolute: 0.6 10*3/uL (ref 0.1–1.0)
Monocytes Relative: 9.9 % (ref 3.0–12.0)
Neutro Abs: 3.6 10*3/uL (ref 1.4–7.7)
Neutrophils Relative %: 55 % (ref 43.0–77.0)
Platelets: 218 10*3/uL (ref 150.0–400.0)
RBC: 4.31 Mil/uL (ref 3.87–5.11)
RDW: 13.6 % (ref 11.5–15.5)
WBC: 6.5 10*3/uL (ref 4.0–10.5)

## 2016-05-16 LAB — BASIC METABOLIC PANEL
BUN: 12 mg/dL (ref 6–23)
CO2: 31 mEq/L (ref 19–32)
Calcium: 10.1 mg/dL (ref 8.4–10.5)
Chloride: 102 mEq/L (ref 96–112)
Creatinine, Ser: 0.91 mg/dL (ref 0.40–1.20)
GFR: 77.15 mL/min (ref >60.00)
Glucose, Bld: 121 mg/dL — ABNORMAL HIGH (ref 70–99)
Potassium: 4.1 mEq/L (ref 3.5–5.1)
Sodium: 140 mEq/L (ref 135–145)

## 2016-05-16 LAB — TSH: TSH: 2.38 u[IU]/mL (ref 0.35–4.50)

## 2016-05-16 LAB — HEPATIC FUNCTION PANEL
ALBUMIN: 4.3 g/dL (ref 3.5–5.2)
ALK PHOS: 83 U/L (ref 39–117)
ALT: 12 U/L (ref 0–35)
AST: 20 U/L (ref 0–37)
BILIRUBIN DIRECT: 0.1 mg/dL (ref 0.0–0.3)
TOTAL PROTEIN: 7.2 g/dL (ref 6.0–8.3)
Total Bilirubin: 0.5 mg/dL (ref 0.2–1.2)

## 2016-05-16 MED ORDER — FAMOTIDINE 40 MG PO TABS: 40.0000 mg | ORAL_TABLET | Freq: Every day | ORAL | 3 refills | Status: DC

## 2016-05-16 NOTE — Progress Notes (Signed)
Subjective:  Patient ID: Deanna Salazar, female    DOB: 07-22-1940  Age: 76 y.o. MRN: [id number]  CC: No chief complaint on file.   HPI [employer] presents for HTN, hypokalemia, GERD f/u C/o fatigue  Outpatient Medications Prior to Visit  Medication Sig Dispense Refill  . acetaminophen (TYLENOL) 325 MG tablet Take 650 mg by mouth as needed for headache.     Marland Kitchen amLODipine (NORVASC) 5 MG tablet take 1 tablet by mouth once daily 90 tablet 2  . aspirin (ASPIRIN CHILDRENS) 81 MG chewable tablet Chew 1 tablet (81 mg total) by mouth daily. 36 tablet 11  . carvedilol (COREG) 12.5 MG tablet take 1 tablet by mouth twice a day with meals 180 tablet 2  . Cholecalciferol (VITAMIN D3) 1000 UNITS CAPS Take 1,000 Units by mouth daily.     . furosemide (LASIX) 20 MG tablet Take 1 tablet (20 mg total) by mouth daily. 90 tablet 3  . ketoconazole (NIZORAL) 2 % cream Apply 1 application topically 2 (two) times daily. 45 g 1  . lansoprazole (PREVACID) 30 MG capsule Take 1 capsule (30 mg total) by mouth daily. 30 capsule 3  . levothyroxine (SYNTHROID, LEVOTHROID) 25 MCG tablet Take 1 tablet (25 mcg total) by mouth daily before breakfast. 90 tablet 3  . losartan (COZAAR) 100 MG tablet take 1 tablet by mouth once daily 90 tablet 2  . meclizine (ANTIVERT) 25 MG tablet Take 1 tablet (25 mg total) by mouth 3 (three) times daily as needed for dizziness or nausea. 30 tablet 1  . potassium chloride (K-DUR) 10 MEQ tablet take 1 tablet by mouth once daily 90 tablet 3  . esomeprazole (NEXIUM) 40 MG capsule Take 1 capsule (40 mg total) by mouth 2 (two) times daily before a meal. (Patient not taking: Reported on 02/28/2016) 60 capsule 5  . fluticasone (FLONASE) 50 MCG/ACT nasal spray Place 2 sprays into both nostrils daily. (Patient not taking: Reported on 02/28/2016) 16 g 6  . loratadine (CLARITIN) 10 MG tablet Take 1 tablet (10 mg total) by mouth daily. (Patient not taking: Reported on 02/28/2016) 100 tablet 3  .  pantoprazole (PROTONIX) 40 MG tablet take 1 tablet by mouth twice a day (Patient not taking: Reported on 02/28/2016) 180 tablet 0   Facility-Administered Medications Prior to Visit  Medication Dose Route Frequency Provider Last Rate Last Dose  . 0.9 %  sodium chloride infusion  500 mL Intravenous Continuous Ladene Artist, MD        ROS Review of Systems  Constitutional: Positive for fatigue. Negative for activity change, appetite change, chills and unexpected weight change.  HENT: Negative for congestion, mouth sores and sinus pressure.   Eyes: Negative for visual disturbance.  Respiratory: Negative for cough and chest tightness.   Gastrointestinal: Negative for abdominal pain and nausea.  Genitourinary: Negative for difficulty urinating, frequency and vaginal pain.  Musculoskeletal: Negative for back pain and gait problem.  Skin: Negative for pallor and rash.  Neurological: Negative for dizziness, tremors, weakness, numbness and headaches.  Psychiatric/Behavioral: Negative for confusion, sleep disturbance and suicidal ideas.    Objective:  BP 138/80 (BP Location: Right Arm, Patient Position: Sitting, Cuff Size: Large)   Pulse 63   Temp 98.6 F (37 C) (Oral)   Ht 5\' 3"  (1.6 m)   Wt 212 lb 1.9 oz (96.2 kg)   SpO2 99%   BMI 37.58 kg/m   BP Readings from Last 3 Encounters:  05/16/16 138/80  02/28/16 Marland Kitchen)  144/58  02/22/16 136/72    Wt Readings from Last 3 Encounters:  05/16/16 212 lb 1.9 oz (96.2 kg)  02/28/16 211 lb (95.7 kg)  02/22/16 211 lb (95.7 kg)    Physical Exam  Constitutional: She appears well-developed. No distress.  HENT:  Head: Normocephalic.  Right Ear: External ear normal.  Left Ear: External ear normal.  Nose: Nose normal.  Mouth/Throat: Oropharynx is clear and moist.  Eyes: Conjunctivae are normal. Pupils are equal, round, and reactive to light. Right eye exhibits no discharge. Left eye exhibits no discharge.  Neck: Normal range of motion. Neck  supple. No JVD present. No tracheal deviation present. No thyromegaly present.  Cardiovascular: Normal rate, regular rhythm and normal heart sounds.   Pulmonary/Chest: No stridor. No respiratory distress. She has no wheezes.  Abdominal: Soft. Bowel sounds are normal. She exhibits no distension and no mass. There is no tenderness. There is no rebound and no guarding.  Musculoskeletal: She exhibits no edema or tenderness.  Lymphadenopathy:    She has no cervical adenopathy.  Neurological: She displays normal reflexes. No cranial nerve deficit. She exhibits normal muscle tone. Coordination normal.  Skin: No rash noted. No erythema.  Psychiatric: She has a normal mood and affect. Her behavior is normal. Judgment and thought content normal.  obese  Lab Results  Component Value Date   WBC 6.2 12/06/2014   HGB 12.2 12/06/2014   HCT 37.4 12/06/2014   PLT 220.0 12/06/2014   GLUCOSE 115 (H) 10/04/2015   CHOL 198 07/14/2013   TRIG 82.0 07/14/2013   HDL 46.50 07/14/2013   LDLDIRECT 180.9 07/26/2009   LDLCALC 135 (H) 07/14/2013   ALT 10 10/04/2015   AST 18 10/04/2015   NA 142 10/04/2015   K 4.2 10/04/2015   CL 104 10/04/2015   CREATININE 1.04 10/04/2015   BUN 8 10/04/2015   CO2 32 10/04/2015   TSH 1.94 12/06/2014   HGBA1C 6.5 10/04/2015   MICROALBUR <0.7 10/04/2015    Dg Esophagus  Result Date: 02/24/2016 CLINICAL DATA:  Dysphagia, unspecified. Burning sensation in the throat. EXAM: ESOPHOGRAM / BARIUM SWALLOW / BARIUM TABLET STUDY TECHNIQUE: Combined double contrast and single contrast examination performed using effervescent crystals, thick barium liquid, and thin barium liquid. The patient was observed with fluoroscopy swallowing a 13 mm barium sulphate tablet. FLUOROSCOPY TIME:  Fluoroscopy Time:  1 minutes 30 seconds Radiation Exposure Index (if provided by the fluoroscopic device): 10 mGy Number of Acquired Spot Images: 1 COMPARISON:  03/24/2009 FINDINGS: Lateral pharyngeal imaging  shows no obstruction or aspiration. Normal distensibility and contour of the esophagus. No mucosal lesion is noted. There is mild narrowing at the GE junction within eccentric appearance ventrally, stable since 2011 and likely diaphragm impression. A 13 mm barium tablet passes into the stomach without delay. No specific esophageal dysmotility. IMPRESSION: 1. Stable esophagram compared to 2011. 2. 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 Electronically Signed   By: Monte Fantasia M.D.   On: 02/24/2016 14:33    Assessment & Plan:   There are no diagnoses linked to this encounter. I have discontinued Ms. Vilchis's loratadine, fluticasone, pantoprazole, and esomeprazole. I am also having her maintain her acetaminophen, Vitamin D3, aspirin, furosemide, levothyroxine, amLODipine, ketoconazole, potassium chloride, meclizine, lansoprazole, carvedilol, and losartan. We will continue to administer sodium chloride.  No orders of the defined types were placed in this encounter.    Follow-up: No Follow-up on file.  Walker Kehr, MD

## 2016-05-16 NOTE — Assessment & Plan Note (Signed)
Cont with Losartan, amlodipine, Coreg

## 2016-05-16 NOTE — Assessment & Plan Note (Signed)
On KCl 

## 2016-05-16 NOTE — Assessment & Plan Note (Addendum)
Worse. Protonix - d/c - pt is scared to take it. Start Pepcid 5/18

## 2016-05-16 NOTE — Assessment & Plan Note (Signed)
Labs

## 2016-05-16 NOTE — Patient Instructions (Signed)
MC well w/Jill 

## 2016-06-30 ENCOUNTER — Other Ambulatory Visit: Payer: Self-pay | Admitting: Internal Medicine

## 2016-07-24 ENCOUNTER — Telehealth: Payer: Self-pay | Admitting: Internal Medicine

## 2016-07-24 NOTE — Telephone Encounter (Signed)
Pt called stating that she saw on the news that losartan (COZAAR) has been recalled. She wanted to know what she needed to do.

## 2016-07-24 NOTE — Telephone Encounter (Signed)
It was Valsartan that was recalled. Losartan is OK Thx

## 2016-07-24 NOTE — Telephone Encounter (Signed)
Notified pt w/MD response.../lmb 

## 2016-07-30 ENCOUNTER — Other Ambulatory Visit: Payer: Self-pay | Admitting: Internal Medicine

## 2016-08-11 ENCOUNTER — Emergency Department (HOSPITAL_COMMUNITY): Payer: PPO

## 2016-08-11 ENCOUNTER — Encounter (HOSPITAL_COMMUNITY): Payer: Self-pay | Admitting: Emergency Medicine

## 2016-08-11 DIAGNOSIS — Z7982 Long term (current) use of aspirin: Secondary | ICD-10-CM | POA: Insufficient documentation

## 2016-08-11 DIAGNOSIS — M79604 Pain in right leg: Secondary | ICD-10-CM | POA: Insufficient documentation

## 2016-08-11 DIAGNOSIS — E119 Type 2 diabetes mellitus without complications: Secondary | ICD-10-CM | POA: Insufficient documentation

## 2016-08-11 DIAGNOSIS — Z79899 Other long term (current) drug therapy: Secondary | ICD-10-CM | POA: Diagnosis not present

## 2016-08-11 DIAGNOSIS — S8991XA Unspecified injury of right lower leg, initial encounter: Secondary | ICD-10-CM | POA: Diagnosis not present

## 2016-08-11 DIAGNOSIS — M25561 Pain in right knee: Secondary | ICD-10-CM | POA: Diagnosis not present

## 2016-08-12 ENCOUNTER — Ambulatory Visit (HOSPITAL_BASED_OUTPATIENT_CLINIC_OR_DEPARTMENT_OTHER)
Admission: RE | Admit: 2016-08-12 | Discharge: 2016-08-12 | Disposition: A | Discharge status: Home / Self Care | Payer: PPO | Source: Ambulatory Visit | Attending: Emergency Medicine | Admitting: Emergency Medicine

## 2016-08-12 ENCOUNTER — Emergency Department (HOSPITAL_COMMUNITY)
Admission: EM | Admit: 2016-08-12 | Discharge: 2016-08-12 | Disposition: A | Discharge status: Home / Self Care | Payer: PPO | Attending: Emergency Medicine | Admitting: Emergency Medicine

## 2016-08-12 DIAGNOSIS — M79604 Pain in right leg: Secondary | ICD-10-CM | POA: Insufficient documentation

## 2016-08-12 DIAGNOSIS — M79609 Pain in unspecified limb: Secondary | ICD-10-CM | POA: Diagnosis not present

## 2016-08-12 DIAGNOSIS — M7989 Other specified soft tissue disorders: Secondary | ICD-10-CM

## 2016-08-12 MED ORDER — OXYCODONE-ACETAMINOPHEN 5-325 MG PO TABS
1.0000 | ORAL_TABLET | Freq: Once | ORAL | Status: AC
  Administered 2016-08-12: 1 via ORAL
  Filled 2016-08-12: qty 1

## 2016-08-12 MED ORDER — ENOXAPARIN SODIUM 100 MG/ML ~~LOC~~ SOLN
95.0000 mg | Freq: Once | SUBCUTANEOUS | Status: AC
  Administered 2016-08-12: 95 mg via SUBCUTANEOUS
  Filled 2016-08-12: qty 0.95

## 2016-08-12 NOTE — ED Provider Notes (Signed)
[facility] DEPT Provider Note   CSN: [id number] Arrival date & time: 08/11/16  2242     History   Chief Complaint Chief Complaint  Patient presents with  . Knee Pain    HPI Deanna Salazar is a 76 y.o. female.  HPI 56 showed African-American female past medical history significant for breast cancer, fibromyalgia, hypertension that presents to the ED today with complaints of right thigh pain that radiates to her right calf along with knee pain. Patient states over the past 2 weeks she has had pain in her right calf that radiates to her right thigh. She states that this evening she was in the kitchen and stepped down off a step stool when she felt a sharp pain in her knee and caused her need to give out. States that she has been able to bear weight due to the pain afterwards. She denies any lower extremity edema. Denies any history of DVT/PE, prolonged immobilizations, recent hospitalizations/surgeries, hormone use. Patient reports pain over the right knee. Denies any weakness or paresthesias. Denies any chest pain or shortness of breath. She has not tried any further symptoms prior to arrival. Ambulation and moving makes the pain worse. Nothing makes the pain better. Past Medical History:  Diagnosis Date  . Abdominal pain, generalized 02/05/2008  . ABDOMINAL PAIN-EPIGASTRIC 03/24/2009  . Acute bronchitis 02/15/2007  . Adenomatous colon polyp 04/2000  . Arthritis   . ASCUS favor benign 10/2011   negative high risk HPV screen  . BACK PAIN 07/20/2008  . Breast cancer (HCC)    l, hx of XRT Dr Benay Spice, Left side  . BREAST CANCER, HX OF 04/16/2007  . CHEST PAIN 02/23/2010  . COLONIC POLYPS, ADENOMATOUS, HX OF 10/01/2007  . CONSTIPATION, CHRONIC 05/10/2007  . Dysuria 04/06/2008  . FATIGUE 12/26/2006  . Fatty liver   . FATTY LIVER DISEASE 11/19/2006  . FIBROMYALGIA 11/19/2006  . GERD 11/19/2006   Dr Fuller Plan  . HEMATOCHEZIA 05/10/2007  . HEMORRHOIDS, INTERNAL 05/10/2007  . HIP PAIN 07/20/2008  .  HYPERLIPIDEMIA 07/26/2009  . HYPERTENSION 12/26/2006  . HYPOKALEMIA 11/25/2007  . HYPOTHYROIDISM 12/26/2006  . IBS 11/19/2006  . INSOMNIA, PERSISTENT 12/26/2006  . KELOID 07/25/2007  . MURMUR 07/25/2007  . Nausea alone 03/23/2010  . PARESTHESIA 04/06/2008  . RENAL CYST, LEFT 02/23/2010  . Rheumatoid arthritis(714.0) 11/19/2006   Dr Vianne Bulls  . RUQ PAIN 11/07/2009  . Tubular adenoma   . VISION IMPAIRMENT, LOW VISION, ONE EYE-LEFT 07/25/2007    Patient Active Problem List   Diagnosis Date Noted  . Hypokalemia 05/16/2016  . Rhonchi 02/09/2016  . Intertrigo 10/04/2015  . Allergic rhinitis 04/05/2015  . Well adult exam 06/28/2014  . Tinea pedis 10/13/2013  . Near syncope 09/28/2013  . LBP (low back pain) 09/24/2012  . Diabetes type 2, controlled ([place]) 11/06/2011  . Breast cancer ([place]) 11/06/2011  . URI, acute 03/02/2011  . Flank pain 03/02/2011  . Hyperglycemia 03/02/2011  . Edema 10/30/2010  . Dyspnea and respiratory abnormalities 10/30/2010  . Cramp in limb 10/30/2010  . Bronchitis 10/06/2010  . NAUSEA ALONE 03/23/2010  . RENAL CYST, LEFT 02/23/2010  . Chest pain 02/23/2010  . Dyslipidemia 07/26/2009  . ABDOMINAL PAIN-EPIGASTRIC 03/24/2009  . HIP PAIN 07/20/2008  . BACK PAIN 07/20/2008  . FEVER, NOS 07/20/2008  . PARESTHESIA 04/06/2008  . Dysuria 04/06/2008  . Dysphagia, pharyngoesophageal phase 02/05/2008  . ABDOMINAL PAIN, GENERALIZED 02/05/2008  . HYPOKALEMIA 11/25/2007  . DEPRESSION 11/25/2007  . COLONIC POLYPS, ADENOMATOUS, HX OF 10/01/2007  .  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 KELOID 07/25/2007  . MURMUR 07/25/2007  . HEMORRHOIDS, INTERNAL 05/10/2007  . CONSTIPATION, CHRONIC 05/10/2007  . BREAST CANCER, HX OF 04/16/2007  . ACUTE BRONCHITIS 02/15/2007  . Hypothyroidism 12/26/2006  . INSOMNIA, PERSISTENT 12/26/2006  . Essential hypertension 12/26/2006  . Chronic fatigue 12/26/2006  . GERD 11/19/2006  . IBS 11/19/2006  . FATTY LIVER  DISEASE 11/19/2006  . Rheumatoid arthritis ([place]) 11/19/2006  . Fibromyalgia 11/19/2006    Past Surgical History:  Procedure Laterality Date  . BREAST SURGERY  2009   Left lumpectomy  . Cyst excision from chest    . knee surgery Left   . Left thyroidectomy    . OS Cataract extraction  2009  . OVARIAN CYST REMOVAL  age 76  . SHOULDER SURGERY     both shoulders  . TOE SURGERY    . TUBAL LIGATION      OB History    Gravida Para Term Preterm AB Living   5 5       5    SAB TAB Ectopic Multiple Live Births                   Home Medications    Prior to Admission medications   Medication Sig Start Date End Date Taking? Authorizing Provider  acetaminophen (TYLENOL) 500 MG tablet Take 500-1,000 mg by mouth every 6 (six) hours as needed for mild pain, moderate pain, fever or headache.   Yes [provider]  amLODipine (NORVASC) 5 MG tablet take 1 tablet by mouth once daily 07/02/16  Yes Plotnikov, Evie Lacks, MD  aspirin (ASPIRIN CHILDRENS) 81 MG chewable tablet Chew 1 tablet (81 mg total) by mouth daily. 12/06/14  Yes Plotnikov, Evie Lacks, MD  carvedilol (COREG) 12.5 MG tablet take 1 tablet by mouth twice a day with meals 02/24/16  Yes Plotnikov, Evie Lacks, MD  cholecalciferol (VITAMIN D) 1000 units tablet Take 1,000 Units by mouth daily.   Yes [provider]  famotidine (PEPCID) 40 MG tablet Take 1 tablet (40 mg total) by mouth daily. 05/16/16  Yes Plotnikov, Evie Lacks, MD  furosemide (LASIX) 20 MG tablet take 1 tablet by mouth once daily 07/30/16  Yes Plotnikov, Evie Lacks, MD  levothyroxine (SYNTHROID, LEVOTHROID) 25 MCG tablet take 1 tablet by mouth once daily BEFORE BREAKFAST 07/30/16  Yes Plotnikov, Evie Lacks, MD  losartan (COZAAR) 100 MG tablet take 1 tablet by mouth once daily 04/02/16  Yes Plotnikov, Evie Lacks, MD  meclizine (ANTIVERT) 25 MG tablet Take 1 tablet (25 mg total) by mouth 3 (three) times daily as needed for dizziness or nausea. 12/12/15 12/11/16 Yes  Plotnikov, Evie Lacks, MD  potassium chloride (K-DUR) 10 MEQ tablet take 1 tablet by mouth once daily 11/25/15  Yes Plotnikov, Evie Lacks, MD    Family History Family History  Problem Relation Age of Onset  . Lung cancer Brother   . Colon cancer Brother   . Diabetes Father   . Brain cancer Brother   . Stroke Sister     Social History Social History  Substance Use Topics  . Smoking status: Never Smoker  . Smokeless tobacco: Never Used  . Alcohol use No     Allergies   Codeine; Hydrocodone; Lovastatin; and Tramadol hcl   Review of Systems Review of Systems  Constitutional: Negative for chills and fever.  Gastrointestinal: Negative for nausea and vomiting.  Musculoskeletal: Positive for arthralgias, joint swelling and myalgias.  Skin: Negative  for color change.  Neurological: Negative for weakness and numbness.     Physical Exam Updated Vital Signs BP (!) 148/79   Pulse 64   Temp 98.8 F (37.1 C) (Oral)   Resp 18   Ht 5\' 7"  (1.702 m)   Wt 95.3 kg (210 lb)   SpO2 98%   BMI 32.89 kg/m   Physical Exam  Constitutional: She appears well-developed and well-nourished. No distress.  HENT:  Head: Normocephalic and atraumatic.  Eyes: Right eye exhibits no discharge. Left eye exhibits no discharge. No scleral icterus.  Neck: Normal range of motion.  Cardiovascular: Intact distal pulses.   Pulmonary/Chest: No respiratory distress.  Musculoskeletal: Normal range of motion.  Limited range of motion of the right knee due to pain. No obvious edema, ecchymosis, erythema noted. She's got medial and lateral joint tenderness. No joint laxity with VARUS stress or anterior drawer test. Patient with strength 5 out of 5 in lower joints bilaterally. DP pulses are 2+ bilaterally. Sensation is intact to sharp/dull. Mild calf tenderness. No tenderness with palpation of the right SI joint.  Neurological: She is alert.  Skin: Skin is warm and dry. Capillary refill takes less than 2 seconds.  No pallor.  Psychiatric: Her behavior is normal. Judgment and thought content normal.  Nursing note and vitals reviewed.    ED Treatments / Results  Labs (all labs ordered are listed, but only abnormal results are displayed) Labs Reviewed - No data to display  EKG  EKG Interpretation None       Radiology Dg Knee Complete 4 Views Right  Result Date: 08/11/2016 CLINICAL DATA:  76 year old female with fall and pain over the lateral aspect of the right knee. EXAM: RIGHT KNEE - COMPLETE 4+ VIEW COMPARISON:  None. FINDINGS: There is no acute fracture or dislocation. There is severe arthritic changes of the knee with tricompartmental narrowing primarily in the patellofemoral compartment, and spurring. There is no significant joint effusion. The soft tissues appear unremarkable. IMPRESSION: 1. No acute fracture or dislocation. 2. Osteoarthritis. Electronically Signed   By: Anner Crete M.D.   On: 08/11/2016 23:49    Procedures Procedures (including critical care time)  Medications Ordered in ED Medications  oxyCODONE-acetaminophen (PERCOCET/ROXICET) 5-325 MG per tablet 1 tablet (1 tablet Oral Given 08/12/16 0346)  enoxaparin (LOVENOX) injection 95 mg (95 mg Subcutaneous Given 08/12/16 0416)     Initial Impression / Assessment and Plan / ED Course  I have reviewed the triage vital signs and the nursing notes.  Pertinent labs & imaging results that were available during my care of the patient were reviewed by me and considered in my medical decision making (see chart for details).     Patient presents with pain in the right leg has been progressively worsening over the past 2 weeks. Also notes sharp pain after mechanical injury prior to arrival in her right knee. Denies any lower extremity edema. No joint laxity noted. X-ray is unremarkable except for chronic osteoarthritis. Unable to obtain ultrasound of lower extremity to rule out DVT at this time. Patient was given pain control and  subcutaneous Lovenox with outpatient ultrasound the morning to rule out DVT. Patient is neurovascularly intact in all extremity is. Strength is normal. If ultrasound of leg shows no DVT patient will need follow up orthopedic. We'll place a knee immobilizer and given crutches. Encourage rice therapy at home.  Pt is hemodynamically stable, in NAD, & able to ambulate in the ED. Evaluation does not show pathology that  would require ongoing emergent intervention or inpatient treatment. I explained the diagnosis to the patient. Pain has been managed & has no complaints prior to dc. Pt is comfortable with above plan and is stable for discharge at this time. All questions were answered prior to disposition. Strict return precautions for f/u to the ED were discussed. Encouraged follow up with PCP. Pt seen by Dr. Tomi Bamberger who is agreeable to the above plan.    Final Clinical Impressions(s) / ED Diagnoses   Final diagnoses:  Right leg pain    New Prescriptions Discharge Medication List as of 08/12/2016  4:21 AM       Doristine Devoid, PA-C 08/14/16 2254    Rolland Porter, MD 08/17/16 2256

## 2016-08-12 NOTE — ED Triage Notes (Signed)
Pt is c/o right knee pain   Pt states she stepped off a step stool and felt a pop    Pt states she has burning from her thigh down into her calf  Pt states she is not able to put any pressure on it

## 2016-08-12 NOTE — Progress Notes (Signed)
VASCULAR LAB PRELIMINARY  PRELIMINARY  PRELIMINARY  PRELIMINARY  Right lower extremity venous duplex completed.    Preliminary report:  There is no DVT, SVT, or Baker's cyst noted in the right lower extremity.   Youssouf Shipley, RVT 08/12/2016, 10:26 AM

## 2016-08-12 NOTE — ED Provider Notes (Signed)
Patient reports she has been having some pain in her right posterior hip area that radiates down her right lateral leg for the past 2 weeks. Today she was on a step stool and when she stepped down her right knee gave out on her and she had intensified pain on the right lateral knee. She states she was unable to walk due to pain afterwards.  Patient already has a knee immobilizer in place. Exam deferred to the PA.  Medical screening examination/treatment/procedure(s) were conducted as a shared visit with non-physician practitioner(s) and myself.  I personally evaluated the patient during the encounter.   EKG Interpretation None       Rolland Porter, MD, Barbette Or, MD 08/12/16 [phone]

## 2016-08-12 NOTE — Discharge Instructions (Signed)
X-ray showed no fracture. We will need to have an ultrasound this morning to rule out any blood clot. If there is no blood clot after the ultrasound will need follow-up with an orthopedist. Maintain the knee immobilizer. Use the crutches for weightbearing as tolerated.  Please rest, ice, compress and elevated the affected body part to help with swelling and pain.   Motrin and Tylenol for pain.    IMPORTANT PATIENT INSTRUCTIONS:  You have been scheduled for an Outpatient Vascular Study at [facility].    If tomorrow is a Saturday, Sunday or holiday, please go to the [facility] Emergency Department Registration Desk at 8 am tomorrow morning and tell them you are there for a vascular study.  If tomorrow is a weekday (Monday-Friday), please go to [facility] Admitting Department at 8 am and tell them  you are  there for a vascular study.

## 2016-08-13 ENCOUNTER — Ambulatory Visit (INDEPENDENT_AMBULATORY_CARE_PROVIDER_SITE_OTHER): Payer: Self-pay

## 2016-08-13 ENCOUNTER — Ambulatory Visit (INDEPENDENT_AMBULATORY_CARE_PROVIDER_SITE_OTHER): Payer: PPO | Admitting: Family

## 2016-08-13 ENCOUNTER — Encounter (INDEPENDENT_AMBULATORY_CARE_PROVIDER_SITE_OTHER): Payer: Self-pay | Admitting: Family

## 2016-08-13 VITALS — Ht 67.0 in | Wt 210.0 lb

## 2016-08-13 DIAGNOSIS — M25561 Pain in right knee: Secondary | ICD-10-CM

## 2016-08-13 DIAGNOSIS — M5416 Radiculopathy, lumbar region: Secondary | ICD-10-CM

## 2016-08-13 MED ORDER — ACETAMINOPHEN-CODEINE #3 300-30 MG PO TABS: 1.0000 | ORAL_TABLET | Freq: Four times a day (QID) | ORAL | 0 refills | Status: DC | PRN

## 2016-08-13 NOTE — Progress Notes (Signed)
Office Visit Note   Patient: Deanna Salazar           Date of Birth: 27-Oct-1940           MRN: [id number] Visit Date: 08/13/2016              Requested by: Cassandria Anger, MD [address], [place] [address] PCP: Cassandria Anger, MD  Chief Complaint  Patient presents with  . Right Knee - Pain    Follow up from ER yesterday stepped off step stool acute onset lateral side knee pain      HPI: The patient is a 76 year old woman who presents a complaining of right knee pain. She stepped down off of a foot stool yesterday and twisted her and her knee. She did present to the emergency room where she had negative radiographs of her knee. She continues to complain of lateral and posterior knee pain instability with weightbearing she is in a immobilizer and still feels unstable she's been using crutches as well. Complains of swelling and pain she has been taking Tylenol without relief.   Complaining of chronic lower extremity pain on the right. States this radiates from her mid low back and radiates down her buttock and lateral thigh all the way down to her foot and ankle. This is chronic. She states she's tried prednisone several times without any relief. Does relate that initially back in 2014 she did have an injection and is unable to recall whether this was effective. States she typically just tries to "deal with the pain."  Assessment & Plan: Visit Diagnoses:  1. Acute pain of right knee   2. Radiculopathy, lumbar region     Plan: We'll refer her to Dr. Ernestina Patches for ESI. For the knee pain will set up an MRI she'll follow-up in office to discuss her results. Continue with the wiser provided a prescription for Tylenol 3.  Follow-Up Instructions: Return for mri review.   Right Knee Exam   Tenderness  The patient is experiencing tenderness in the LCL and lateral joint line.  Range of Motion  Right knee extension: Difficulty with active extension.   Tests  Varus:  negative Valgus: positive  Other  Erythema: absent Swelling: mild  Comments:  Resists exam   Back Exam   Tenderness  The patient is experiencing tenderness in the lumbar.  Tests  Straight leg raise right: positive Straight leg raise left: negative  Other  Gait: abnormal (Using crutches today)       Patient is alert, oriented, no adenopathy, well-dressed, normal affect, normal respiratory effort.   Imaging: No results found.  Labs: Lab Results  Component Value Date   HGBA1C 6.5 10/04/2015   HGBA1C 6.6 (H) 04/05/2015   HGBA1C 6.2 12/06/2014   ESRSEDRATE 26 (H) 03/12/2013   ESRSEDRATE 26 (H) 07/26/2009   ESRSEDRATE 31 (H) 03/03/2009   LABORGA No Salmonella,Shigella,Campylobacter or Yersinia 03/24/2010   LABORGA isolated. 03/24/2010    Orders:  Orders Placed This Encounter  Procedures  . XR Lumbar Spine 2-3 Views  . MR Knee Right w/o contrast   Meds ordered this encounter  Medications  . acetaminophen-codeine (TYLENOL #3) 300-30 MG tablet    Sig: Take 1 tablet by mouth every 6 (six) hours as needed for moderate pain.    Dispense:  30 tablet    Refill:  0     Procedures: No procedures performed  Clinical Data: No additional findings.  ROS:  All other  systems negative, except as noted in the HPI. Review of Systems  Objective: Vital Signs: Ht 5\' 7"  (1.702 m)   Wt 210 lb (95.3 kg)   BMI 32.89 kg/m   Specialty Comments:  No specialty comments available.  PMFS History: Patient Active Problem List   Diagnosis Date Noted  . Hypokalemia 05/16/2016  . Rhonchi 02/09/2016  . Intertrigo 10/04/2015  . Allergic rhinitis 04/05/2015  . Well adult exam 06/28/2014  . Tinea pedis 10/13/2013  . Near syncope 09/28/2013  . LBP (low back pain) 09/24/2012  . Diabetes type 2, controlled ([place]) 11/06/2011  . Breast cancer ([place]) 11/06/2011  . URI, acute 03/02/2011  . Flank pain 03/02/2011  . Hyperglycemia 03/02/2011  . Edema 10/30/2010  . Dyspnea and  respiratory abnormalities 10/30/2010  . Cramp in limb 10/30/2010  . Bronchitis 10/06/2010  . NAUSEA ALONE 03/23/2010  . RENAL CYST, LEFT 02/23/2010  . Chest pain 02/23/2010  . Dyslipidemia 07/26/2009  . ABDOMINAL PAIN-EPIGASTRIC 03/24/2009  . HIP PAIN 07/20/2008  . BACK PAIN 07/20/2008  . FEVER, NOS 07/20/2008  . PARESTHESIA 04/06/2008  . Dysuria 04/06/2008  . Dysphagia, pharyngoesophageal phase 02/05/2008  . ABDOMINAL PAIN, GENERALIZED 02/05/2008  . HYPOKALEMIA 11/25/2007  . DEPRESSION 11/25/2007  . COLONIC POLYPS, ADENOMATOUS, HX OF 10/01/2007  . 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 KELOID 07/25/2007  . MURMUR 07/25/2007  . HEMORRHOIDS, INTERNAL 05/10/2007  . CONSTIPATION, CHRONIC 05/10/2007  . BREAST CANCER, HX OF 04/16/2007  . ACUTE BRONCHITIS 02/15/2007  . Hypothyroidism 12/26/2006  . INSOMNIA, PERSISTENT 12/26/2006  . Essential hypertension 12/26/2006  . Chronic fatigue 12/26/2006  . GERD 11/19/2006  . IBS 11/19/2006  . FATTY LIVER DISEASE 11/19/2006  . Rheumatoid arthritis ([place]) 11/19/2006  . Fibromyalgia 11/19/2006   Past Medical History:  Diagnosis Date  . Abdominal pain, generalized 02/05/2008  . ABDOMINAL PAIN-EPIGASTRIC 03/24/2009  . Acute bronchitis 02/15/2007  . Adenomatous colon polyp 04/2000  . Arthritis   . ASCUS favor benign 10/2011   negative high risk HPV screen  . BACK PAIN 07/20/2008  . Breast cancer (HCC)    l, hx of XRT Dr Benay Spice, Left side  . BREAST CANCER, HX OF 04/16/2007  . CHEST PAIN 02/23/2010  . COLONIC POLYPS, ADENOMATOUS, HX OF 10/01/2007  . CONSTIPATION, CHRONIC 05/10/2007  . Dysuria 04/06/2008  . FATIGUE 12/26/2006  . Fatty liver   . FATTY LIVER DISEASE 11/19/2006  . FIBROMYALGIA 11/19/2006  . GERD 11/19/2006   Dr Fuller Plan  . HEMATOCHEZIA 05/10/2007  . HEMORRHOIDS, INTERNAL 05/10/2007  . HIP PAIN 07/20/2008  . HYPERLIPIDEMIA 07/26/2009  . HYPERTENSION 12/26/2006  . HYPOKALEMIA 11/25/2007  . HYPOTHYROIDISM 12/26/2006    . IBS 11/19/2006  . INSOMNIA, PERSISTENT 12/26/2006  . KELOID 07/25/2007  . MURMUR 07/25/2007  . Nausea alone 03/23/2010  . PARESTHESIA 04/06/2008  . RENAL CYST, LEFT 02/23/2010  . Rheumatoid arthritis(714.0) 11/19/2006   Dr Vianne Bulls  . RUQ PAIN 11/07/2009  . Tubular adenoma   . VISION IMPAIRMENT, LOW VISION, ONE EYE-LEFT 07/25/2007    Family History  Problem Relation Age of Onset  . Lung cancer Brother   . Colon cancer Brother   . Diabetes Father   . Brain cancer Brother   . Stroke Sister     Past Surgical History:  Procedure Laterality Date  . BREAST SURGERY  2009   Left lumpectomy  . Cyst excision from chest    . knee surgery Left   . Left thyroidectomy    . OS Cataract  extraction  2009  . OVARIAN CYST REMOVAL  age 48  . SHOULDER SURGERY     both shoulders  . TOE SURGERY    . TUBAL LIGATION     Social History   Occupational History  . retired Retired   Social History Main Topics  . Smoking status: Never Smoker  . Smokeless tobacco: Never Used  . Alcohol use No  . Drug use: No  . Sexual activity: No     Comment: 1st intercourse 76 yo-Fewer than 5 partners

## 2016-08-20 ENCOUNTER — Ambulatory Visit
Admission: RE | Admit: 2016-08-20 | Discharge: 2016-08-20 | Disposition: A | Discharge status: Home / Self Care | Payer: PPO | Source: Ambulatory Visit | Attending: Family | Admitting: Family

## 2016-08-20 DIAGNOSIS — M25561 Pain in right knee: Secondary | ICD-10-CM | POA: Diagnosis not present

## 2016-08-20 DIAGNOSIS — S8991XA Unspecified injury of right lower leg, initial encounter: Secondary | ICD-10-CM | POA: Diagnosis not present

## 2016-08-20 DIAGNOSIS — M5416 Radiculopathy, lumbar region: Secondary | ICD-10-CM

## 2016-08-21 NOTE — Progress Notes (Signed)
Subjective:   Deanna Salazar is a 76 y.o. female who presents for Medicare Annual (Subsequent) preventive examination.  Review of Systems:  No ROS.  Medicare Wellness Visit. Additional risk factors are reflected in the social history.  Cardiac Risk Factors include: advanced age (>74men, >61 women);diabetes mellitus;hypertension;obesity (BMI >30kg/m2) Sleep patterns: has daytime sleepiness, gets up 1-2 times nightly to void and sleeps 4-5 hours nightly.  Patient reports insomnia issues, discussed recommended sleep tips and stress reduction tips, education was attached to patient's AVS.  Home Safety/Smoke Alarms: Feels safe in home. Smoke alarms in place.  Living environment; residence and Firearm Safety: 1-story house/ trailer, no firearms. Lives with husband, no needs for DME, good support system Seat Belt Safety/Bike Helmet: Wears seat belt.   Counseling:   Eye Exam- appointment yearly Dental- dentures  Female:   Pap- N/A      Mammo- Last 02/06/16, BI-RADS category 2: benign      Dexa scan- Last 11/10/13, normal       CCS- Last 06/12/12, recall 5 years     Objective:     Vitals: BP (!) 142/78 (BP Location: Right Arm, Patient Position: Sitting, Cuff Size: Large)   Pulse (!) 59   Temp 98.5 F (36.9 C) (Oral)   Ht 5\' 7"  (1.702 m)   Wt 211 lb (95.7 kg)   SpO2 99%   BMI 33.05 kg/m   Body mass index is 33.05 kg/m.   Tobacco History  Smoking Status  . Never Smoker  Smokeless Tobacco  . Never Used     Counseling given: Not Answered   Past Medical History:  Diagnosis Date  . Abdominal pain, generalized 02/05/2008  . ABDOMINAL PAIN-EPIGASTRIC 03/24/2009  . Acute bronchitis 02/15/2007  . Adenomatous colon polyp 04/2000  . Arthritis   . ASCUS favor benign 10/2011   negative high risk HPV screen  . BACK PAIN 07/20/2008  . Breast cancer (HCC)    l, hx of XRT Dr Benay Spice, Left side  . BREAST CANCER, HX OF 04/16/2007  . CHEST PAIN 02/23/2010  . COLONIC POLYPS, ADENOMATOUS, HX  OF 10/01/2007  . CONSTIPATION, CHRONIC 05/10/2007  . Dysuria 04/06/2008  . FATIGUE 12/26/2006  . Fatty liver   . FATTY LIVER DISEASE 11/19/2006  . FIBROMYALGIA 11/19/2006  . GERD 11/19/2006   Dr Fuller Plan  . HEMATOCHEZIA 05/10/2007  . HEMORRHOIDS, INTERNAL 05/10/2007  . HIP PAIN 07/20/2008  . HYPERLIPIDEMIA 07/26/2009  . HYPERTENSION 12/26/2006  . HYPOKALEMIA 11/25/2007  . HYPOTHYROIDISM 12/26/2006  . IBS 11/19/2006  . INSOMNIA, PERSISTENT 12/26/2006  . KELOID 07/25/2007  . MURMUR 07/25/2007  . Nausea alone 03/23/2010  . PARESTHESIA 04/06/2008  . RENAL CYST, LEFT 02/23/2010  . Rheumatoid arthritis(714.0) 11/19/2006   Dr Vianne Bulls  . RUQ PAIN 11/07/2009  . Tubular adenoma   . 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 BREAST SURGERY  2009   Left lumpectomy  . Cyst excision from chest    . knee surgery Left   . Left thyroidectomy    . OS Cataract extraction  2009  . OVARIAN CYST REMOVAL  age 64  . SHOULDER SURGERY     both shoulders  . TOE SURGERY    . TUBAL LIGATION     Family History  Problem Relation Age of Onset  . Lung cancer Brother   . Colon cancer Brother   . Diabetes Father   . Brain cancer Brother   .  Stroke Sister    History  Sexual Activity  . Sexual activity: No    Comment: 1st intercourse 76 yo-Fewer than 5 partners    Outpatient Encounter Prescriptions as of 08/22/2016  Medication Sig  . acetaminophen (TYLENOL) 500 MG tablet Take 500-1,000 mg by mouth every 6 (six) hours as needed for mild pain, moderate pain, fever or headache.  Marland Kitchen acetaminophen-codeine (TYLENOL #3) 300-30 MG tablet Take 1 tablet by mouth every 6 (six) hours as needed for moderate pain.  Marland Kitchen amLODipine (NORVASC) 5 MG tablet Take 1 tablet (5 mg total) by mouth daily.  Marland Kitchen aspirin (ASPIRIN CHILDRENS) 81 MG chewable tablet Chew 1 tablet (81 mg total) by mouth daily.  . carvedilol (COREG) 12.5 MG tablet take 1 tablet by mouth twice a day  with meals  . cholecalciferol (VITAMIN D) 1000 units tablet Take 1,000 Units by mouth daily.  . furosemide (LASIX) 20 MG tablet take 1 tablet by mouth once daily  . levothyroxine (SYNTHROID, LEVOTHROID) 25 MCG tablet take 1 tablet by mouth once daily BEFORE BREAKFAST  . losartan (COZAAR) 100 MG tablet take 1 tablet by mouth once daily  . meclizine (ANTIVERT) 25 MG tablet Take 1 tablet (25 mg total) by mouth 3 (three) times daily as needed for dizziness or nausea.  . potassium chloride (K-DUR) 10 MEQ tablet take 1 tablet by mouth once daily  . [DISCONTINUED] amLODipine (NORVASC) 5 MG tablet take 1 tablet by mouth once daily  . [DISCONTINUED] famotidine (PEPCID) 40 MG tablet Take 1 tablet (40 mg total) by mouth daily.   Facility-Administered Encounter Medications as of 08/22/2016  Medication  . 0.9 %  sodium chloride infusion    Activities of Daily Living In your present state of health, do you have any difficulty performing the following activities: 08/22/2016  Hearing? N  Vision? N  Difficulty concentrating or making decisions? N  Walking or climbing stairs? N  Dressing or bathing? N  Doing errands, shopping? N  Preparing Food and eating ? N  Using the Toilet? N  In the past six months, have you accidently leaked urine? N  Do you have problems with loss of bowel control? N  Managing your Medications? N  Managing your Finances? N  Housekeeping or managing your Housekeeping? N  Some recent data might be hidden    Patient Care Team: Plotnikov, Evie Lacks, MD as PCP - General Amil Amen Irven Easterly, MD (Rheumatology) Newt Minion, MD (Orthopedic Surgery)    Assessment:    Physical assessment deferred to PCP.  Exercise Activities and Dietary recommendations Current Exercise Habits: Home exercise routine ([employer] exercise pamphlet provided), Type of exercise: walking, Time (Minutes): 30, Frequency (Times/Week): 3, Weekly Exercise (Minutes/Week): 90, Intensity: Mild, Exercise limited by:  None identified  Diet (meal preparation, eat out, water intake, caffeinated beverages, dairy products, fruits and vegetables): in general, a "healthy" diet  , well balanced   Reviewed heart healthy and diabetic diet, encouraged patient to increase daily water intake. Diet education was attached to patient's AVS.  Goals    . lose weight          Continue to exercise several times weekly, read food labels to decrease carbohydrates and sugar, monitor my portions.  Enjoy life, family and church.    . Weight < 200 lb (90.719 kg)          Will continue the journey;  Will start to monitor sweets;  And keep exercising Low weight with some toning of muscles as tolerated  Fall Risk Fall Risk  08/22/2016 02/09/2016 11/17/2014 04/05/2014  Falls in the past year? No No Yes Yes  Number falls in past yr: - - 1 1  Injury with Fall? - - No Yes  Follow up - - Education provided -   Depression Screen PHQ 2/9 Scores 08/22/2016 02/09/2016 01/09/2015 11/17/2014  PHQ - 2 Score 1 0 0 0  PHQ- 9 Score 6 - - -     Cognitive Function MMSE - Mini Mental State Exam 08/22/2016  Orientation to time 5  Orientation to Place 5  Registration 3  Attention/ Calculation 4  Recall 2  Language- name 2 objects 2  Language- repeat 1  Language- follow 3 step command 3  Language- read & follow direction 1  Write a sentence 1  Copy design 1  Total score 28        Immunization History  Administered Date(s) Administered  . Influenza Split 10/30/2010, 01/16/2012  . Influenza Whole 03/03/2009  . Influenza, High Dose Seasonal PF 12/06/2014, 10/04/2015  . Influenza,inj,Quad PF,36+ Mos 09/24/2012, 01/04/2014  . Pneumococcal Conjugate-13 04/05/2014  . Pneumococcal Polysaccharide-23 12/06/2014  . Td 04/05/2014   Screening Tests Health Maintenance  Topic Date Due  . OPHTHALMOLOGY EXAM  08/24/1950  . FOOT EXAM  12/06/2015  . INFLUENZA VACCINE  08/08/2016  . COLONOSCOPY  06/12/2017  . TETANUS/TDAP  04/04/2024  .  DEXA SCAN  Completed  . PNA vac Low Risk Adult  Completed      Plan:    Continue doing brain stimulating activities (puzzles, reading, adult coloring books, staying active) to keep memory sharp.   Continue to eat heart healthy diet (full of fruits, vegetables, whole grains, lean protein, water--limit salt, fat, and sugar intake) and increase physical activity as tolerated.  I have personally reviewed and noted the following in the patient's chart:   . Medical and social history . Use of alcohol, tobacco or illicit drugs  . Current medications and supplements . Functional ability and status . Nutritional status . Physical activity . Advanced directives . List of other physicians . Vitals . Screenings to include cognitive, depression, and falls . Referrals and appointments  In addition, I have reviewed and discussed with patient certain preventive protocols, quality metrics, and best practice recommendations. A written personalized care plan for preventive services as well as general preventive health recommendations were provided to patient.     Michiel Cowboy, RN  08/22/2016

## 2016-08-21 NOTE — Progress Notes (Signed)
Pre visit review using our clinic review tool, if applicable. No additional management support is needed unless otherwise documented below in the visit note. 

## 2016-08-22 ENCOUNTER — Encounter: Payer: Self-pay | Admitting: Internal Medicine

## 2016-08-22 ENCOUNTER — Other Ambulatory Visit (INDEPENDENT_AMBULATORY_CARE_PROVIDER_SITE_OTHER): Payer: PPO

## 2016-08-22 ENCOUNTER — Ambulatory Visit (INDEPENDENT_AMBULATORY_CARE_PROVIDER_SITE_OTHER): Payer: PPO | Admitting: Internal Medicine

## 2016-08-22 VITALS — BP 142/78 | HR 59 | Temp 98.5°F | Ht 67.0 in | Wt 211.0 lb

## 2016-08-22 DIAGNOSIS — Z0000 Encounter for general adult medical examination without abnormal findings: Secondary | ICD-10-CM | POA: Diagnosis not present

## 2016-08-22 DIAGNOSIS — M797 Fibromyalgia: Secondary | ICD-10-CM | POA: Diagnosis not present

## 2016-08-22 DIAGNOSIS — E119 Type 2 diabetes mellitus without complications: Secondary | ICD-10-CM | POA: Diagnosis not present

## 2016-08-22 DIAGNOSIS — K219 Gastro-esophageal reflux disease without esophagitis: Secondary | ICD-10-CM | POA: Diagnosis not present

## 2016-08-22 DIAGNOSIS — I10 Essential (primary) hypertension: Secondary | ICD-10-CM

## 2016-08-22 LAB — HEPATIC FUNCTION PANEL
ALT: 14 U/L (ref 0–35)
AST: 23 U/L (ref 0–37)
Albumin: 4.2 g/dL (ref 3.5–5.2)
Alkaline Phosphatase: 74 U/L (ref 39–117)
Bilirubin, Direct: 0.2 mg/dL (ref 0.0–0.3)
Total Bilirubin: 0.4 mg/dL (ref 0.2–1.2)
Total Protein: 6.6 g/dL (ref 6.0–8.3)

## 2016-08-22 LAB — BASIC METABOLIC PANEL
BUN: 15 mg/dL (ref 6–23)
CO2: 27 mEq/L (ref 19–32)
Calcium: 9.7 mg/dL (ref 8.4–10.5)
Chloride: 106 mEq/L (ref 96–112)
Creatinine, Ser: 0.89 mg/dL (ref 0.40–1.20)
GFR: 79.1 mL/min (ref >60.00)
Glucose, Bld: 113 mg/dL — ABNORMAL HIGH (ref 70–99)
Potassium: 4.1 mEq/L (ref 3.5–5.1)
Sodium: 140 mEq/L (ref 135–145)

## 2016-08-22 LAB — URINALYSIS, ROUTINE W REFLEX MICROSCOPIC
Bilirubin Urine: NEGATIVE
Ketones, ur: NEGATIVE
Leukocytes, UA: NEGATIVE
Nitrite: NEGATIVE
Specific Gravity, Urine: <=1.005 — AB (ref 1.000–1.030)
Total Protein, Urine: NEGATIVE
Urine Glucose: NEGATIVE
Urobilinogen, UA: 0.2 (ref 0.0–1.0)
pH: 5.5 (ref 5.0–8.0)

## 2016-08-22 LAB — MICROALBUMIN / CREATININE URINE RATIO
Creatinine,U: 47.6 mg/dL
Microalb Creat Ratio: 1.5 mg/g (ref 0.0–30.0)
Microalb, Ur: 0.7 mg/dL (ref 0.0–1.9)

## 2016-08-22 LAB — HEMOGLOBIN A1C: Hgb A1c MFr Bld: 6.3 % (ref 4.6–6.5)

## 2016-08-22 MED ORDER — AMLODIPINE BESYLATE 5 MG PO TABS: 5.0000 mg | ORAL_TABLET | Freq: Every day | ORAL | 2 refills | Status: DC

## 2016-08-22 NOTE — Assessment & Plan Note (Signed)
Losartan, Coreg, Norvasc 

## 2016-08-22 NOTE — Assessment & Plan Note (Signed)
Duloxetine, Aleve

## 2016-08-22 NOTE — Patient Instructions (Addendum)
Continue doing brain stimulating activities (puzzles, reading, adult coloring books, staying active) to keep memory sharp.   Continue to eat heart healthy diet (full of fruits, vegetables, whole grains, lean protein, water--limit salt, fat, and sugar intake) and increase physical activity as tolerated.   Deanna Salazar , Thank you for taking time to come for your Medicare Wellness Visit. I appreciate your ongoing commitment to your health goals. Please review the following plan we discussed and let me know if I can assist you in the future.   These are the goals we discussed: Goals    . lose weight          Continue to exercise several times weekly, read food labels to decrease carbohydrates and sugar, monitor my portions.  Enjoy life, family and church.    . Weight < 200 lb (90.719 kg)          Will continue the journey;  Will start to monitor sweets;  And keep exercising Low weight with some toning of muscles as tolerated       This is a list of the screening recommended for you and due dates:  Health Maintenance  Topic Date Due  . Eye exam for diabetics  08/24/1950  . Complete foot exam   12/06/2015  . Flu Shot  08/08/2016  . Colon Cancer Screening  06/12/2017  . Tetanus Vaccine  04/04/2024  . DEXA scan (bone density measurement)  Completed  . Pneumonia vaccines  Completed     Carbohydrate Counting for Diabetes Mellitus, Adult Carbohydrate counting is a method for keeping track of how many carbohydrates you eat. Eating carbohydrates naturally increases the amount of sugar (glucose) in the blood. Counting how many carbohydrates you eat helps keep your blood glucose within normal limits, which helps you manage your diabetes (diabetes mellitus). It is important to know how many carbohydrates you can safely have in each meal. This is different for every person. A diet and nutrition specialist (registered dietitian) can help you make a meal plan and calculate how many carbohydrates you  should have at each meal and snack. Carbohydrates are found in the following foods:  Grains, such as breads and cereals.  Dried beans and soy products.  Starchy vegetables, such as potatoes, peas, and corn.  Fruit and fruit juices.  Milk and yogurt.  Sweets and snack foods, such as cake, cookies, candy, chips, and soft drinks.  How do I count carbohydrates? There are two ways to count carbohydrates in food. You can use either of the methods or a combination of both. Reading "Nutrition Facts" on packaged food The "Nutrition Facts" list is included on the labels of almost all packaged foods and beverages in the [place] It includes:  The serving size.  Information about nutrients in each serving, including the grams (g) of carbohydrate per serving.  To use the "Nutrition Facts":  Decide how many servings you will have.  Multiply the number of servings by the number of carbohydrates per serving.  The resulting number is the total amount of carbohydrates that you will be having.  Learning standard serving sizes of other foods When you eat foods containing carbohydrates that are not packaged or do not include "Nutrition Facts" on the label, you need to measure the servings in order to count the amount of carbohydrates:  Measure the foods that you will eat with a food scale or measuring cup, if needed.  Decide how many standard-size servings you will eat.  Multiply the number of  servings by 15. Most carbohydrate-rich foods have about 15 g of carbohydrates per serving. ? For example, if you eat 8 oz (170 g) of strawberries, you will have eaten 2 servings and 30 g of carbohydrates (2 servings x 15 g = 30 g).  For foods that have more than one food mixed, such as soups and casseroles, you must count the carbohydrates in each food that is included.  The following list contains standard serving sizes of common carbohydrate-rich foods. Each of these servings has about 15 g of  carbohydrates:   hamburger bun or  English muffin.   oz (15 mL) syrup.   oz (14 g) jelly.  1 slice of bread.  1 six-inch tortilla.  3 oz (85 g) cooked rice or pasta.  4 oz (113 g) cooked dried beans.  4 oz (113 g) starchy vegetable, such as peas, corn, or potatoes.  4 oz (113 g) hot cereal.  4 oz (113 g) mashed potatoes or  of a large baked potato.  4 oz (113 g) canned or frozen fruit.  4 oz (120 mL) fruit juice.  4-6 crackers.  6 chicken nuggets.  6 oz (170 g) unsweetened dry cereal.  6 oz (170 g) plain fat-free yogurt or yogurt sweetened with artificial sweeteners.  8 oz (240 mL) milk.  8 oz (170 g) fresh fruit or one small piece of fruit.  24 oz (680 g) popped popcorn.  Example of carbohydrate counting Sample meal  3 oz (85 g) chicken breast.  6 oz (170 g) brown rice.  4 oz (113 g) corn.  8 oz (240 mL) milk.  8 oz (170 g) strawberries with sugar-free whipped topping. Carbohydrate calculation 1. Identify the foods that contain carbohydrates: ? Rice. ? Corn. ? Milk. ? Strawberries. 2. Calculate how many servings you have of each food: ? 2 servings rice. ? 1 serving corn. ? 1 serving milk. ? 1 serving strawberries. 3. Multiply each number of servings by 15 g: ? 2 servings rice x 15 g = 30 g. ? 1 serving corn x 15 g = 15 g. ? 1 serving milk x 15 g = 15 g. ? 1 serving strawberries x 15 g = 15 g. 4. Add together all of the amounts to find the total grams of carbohydrates eaten: ? 30 g + 15 g + 15 g + 15 g = 75 g of carbohydrates total. This information is not intended to replace advice given to you by your health care provider. Make sure you discuss any questions you have with your health care provider. Document Released: 12/25/2004 Document Revised: 07/15/2015 Document Reviewed: 06/08/2015 Elsevier Interactive Patient Education  2018 [employer] Foods that are packaged or in containers have a Nutrition Facts panel on  the side or back. This is commonly called the food label. The food label helps you make healthy food choices by providing information about serving size and the amount of calories and various nutrients in the food. You can check the food label to find out if the food contains high or low amounts of nutrients that you want to limit in your diet. You can also use the food label to see if the food is a good source of the nutrients that you want to make sure are included in your diet. How do I read the food label?  Begin by checking the serving size and number of servings in the container. All of the nutrition information listed on the food  label is based on one serving. If you eat more than one serving, you must multiply the amounts (such as calories, grams of saturated fat, or milligrams of sodium) by the number of servings.  Check the calories. Choosing foods that are low in calories can help you manage your weight.  Look at the numbers in the % Daily Value column for each listed nutrient. This gives you an idea of how much of the daily recommended amount for that nutrient is provided in one serving of the food. A daily value of 5% or less is considered low. A daily value of 20% or more is considered high.  Check the amounts for the items you should limit in your diet. These include: ? Total fat. ? Saturated fat. ? Trans fat. ? Cholesterol. ? Sodium.  Check the amounts for the items you should make sure you get enough of. These include: ? Dietary fiber. ? Vitamins A and C. ? Calcium. ? Iron. What information is provided on the food label? Serving information  Serving size. ? The serving size is listed in cups or pieces. The nutrient amounts listed on the food label apply to this amount of the food.  Servings per container or package. ? This shows the number of servings you can expect to get from the container or package if you follow the suggested serving size. Amount per  serving  Calories. ? The number of calories in one serving of the food. This information is helpful in managing weight. Low-calorie foods contain 40 calories or less. High-calorie foods contain 400 or more calories.  Calories from fat. ? The number of calories that come from fat in one serving. Percent daily value Percent daily value (shown on the label as % Daily Value) tells you what percent of the daily value for each nutrient one serving provides. The daily value is the recommended amount of the nutrient that you should get each day. For example, if 15% is listed next to dietary fiber, it means that one serving of the food will give you 15% of the recommended amount of fiber that you should get in a day. The daily values are based on a 2,000-calorie-per-day diet. You may get more or less than 2,000 calories in your diet each day, but the % Daily Value gives you an idea of whether the food contains a high or low amount of the listed nutrient. A daily value of 5% or less is low. A daily value of 20% or more is high. Total fat Total fat shows you the total amount of fat in one serving (listed in grams). Foods with high amounts of fat usually have higher calories and may lead to weight gain. Two of the fats that make up a portion of the total fat are included on the label:  Saturated fat. ? This number is the amount of saturated fat in one serving (listed in grams). Saturated fat increases the amount of blood cholesterol and should be limited to less than 7% of total calories each day. This means that if you eat 2,000 calories each day, you should eat less than 140 calories from saturated fat.    Cholesterol The amount of cholesterol in one serving is listed in milligrams. Cholesterol should be limited to no more than 300 mg each day. Sodium The amount of sodium in one serving is listed in milligrams. Most people should limit their sodium intake to 2,300 mg a day. Total carbohydrate This number  shows the  amount of total carbohydrate in one serving (listed in grams). This information can help people with diabetes manage the amount of carbohydrate they eat. Two of the carbohydrates that make up a portion of the total carbohydrate are included on the label:  Dietary fiber. ? The amount of dietary fiber in one serving is listed in grams. Most people should eat at least 25 g of dietary fiber each day.  Sugars. ? The amount of sugar in one serving is also listed in grams. This value includes both naturally occurring sugars from fruit and milk and added sugars such as honey or table sugar.  Protein The amount of protein in one serving is listed in grams. What other important labeling is on the food package? Ingredients Food labels will list each ingredient in the food. The first ingredient listed is the ingredient that the food has the most of. The ingredients are listed in the order of their amount by weight from highest to lowest. Food allergen labeling Food labels may also include a food allergen warning. Listed here are ingredients that can cause allergic reactions in some people. The potential allergens are listed behind the word "Contains" or "May contain." Examples of ingredients that may be listed are wheat, dairy, eggs, soy, and nuts. If a person knows that he or she is allergic to one of these ingredients, he or she will know to avoid that food. Where to find more information:  [place] Food and Drug Administration: [employer] This information is not intended to replace advice given to you by your health care provider. Make sure you discuss any questions you have with your health care provider. Document Released: 12/25/2004 Document Revised: 08/24/2015 Document Reviewed: 11/17/2012 Elsevier Interactive Patient Education  2017 [place] and Stress Management Stress is a normal reaction to life events. It is what you feel when life demands more than you are used to or more  than you can handle. Some stress can be useful. For example, the stress reaction can help you catch the last bus of the day, study for a test, or meet a deadline at work. But stress that occurs too often or for too long can cause problems. It can affect your emotional health and interfere with relationships and normal daily activities. Too much stress can weaken your immune system and increase your risk for physical illness. If you already have a medical problem, stress can make it worse. What are the causes? All sorts of life events may cause stress. An event that causes stress for one person may not be stressful for another person. Major life events commonly cause stress. These may be positive or negative. Examples include losing your job, moving into a new home, getting married, having a baby, or losing a loved one. Less obvious life events may also cause stress, especially if they occur day after day or in combination. Examples include working long hours, driving in traffic, caring for children, being in debt, or being in a difficult relationship. What are the signs or symptoms? Stress may cause emotional symptoms including, the following:  Anxiety. This is feeling worried, afraid, on edge, overwhelmed, or out of control.  Anger. This is feeling irritated or impatient.  Depression. This is feeling sad, down, helpless, or guilty.  Difficulty focusing, remembering, or making decisions.  Stress may cause physical symptoms, including the following:  Aches and pains. These may affect your head, neck, back, stomach, or other areas of your body.  Tight muscles or clenched  jaw.  Low energy or trouble sleeping.  Stress may cause unhealthy behaviors, including the following:  Eating to feel better (overeating) or skipping meals.  Sleeping too little, too much, or both.  Working too much or putting off tasks (procrastination).  Smoking, drinking alcohol, or using drugs to feel better.  How  is this diagnosed? Stress is diagnosed through an assessment by your health care provider. Your health care provider will ask questions about your symptoms and any stressful life events.Your health care provider will also ask about your medical history and may order blood tests or other tests. Certain medical conditions and medicine can cause physical symptoms similar to stress. Mental illness can cause emotional symptoms and unhealthy behaviors similar to stress. Your health care provider may refer you to a mental health professional for further evaluation. How is this treated? Stress management is the recommended treatment for stress.The goals of stress management are reducing stressful life events and coping with stress in healthy ways. Techniques for reducing stressful life events include the following:  Stress identification. Self-monitor for stress and identify what causes stress for you. These skills may help you to avoid some stressful events.  Time management. Set your priorities, keep a calendar of events, and learn to say "no." These tools can help you avoid making too many commitments.  Techniques for coping with stress include the following:  Rethinking the problem. Try to think realistically about stressful events rather than ignoring them or overreacting. Try to find the positives in a stressful situation rather than focusing on the negatives.  Exercise. Physical exercise can release both physical and emotional tension. The key is to find a form of exercise you enjoy and do it regularly.  Relaxation techniques. These relax the body and mind. Examples include yoga, meditation, tai chi, biofeedback, deep breathing, progressive muscle relaxation, listening to music, being out in nature, journaling, and other hobbies. Again, the key is to find one or more that you enjoy and can do regularly.  Healthy lifestyle. Eat a balanced diet, get plenty of sleep, and do not smoke. Avoid using  alcohol or drugs to relax.  Strong support network. Spend time with family, friends, or other people you enjoy being around.Express your feelings and talk things over with someone you trust.  Counseling or talktherapy with a mental health professional may be helpful if you are having difficulty managing stress on your own. Medicine is typically not recommended for the treatment of stress.Talk to your health care provider if you think you need medicine for symptoms of stress. Follow these instructions at home:  Keep all follow-up visits as directed by your health care provider.  Take all medicines as directed by your health care provider. Contact a health care provider if:  Your symptoms get worse or you start having new symptoms.  You feel overwhelmed by your problems and can no longer manage them on your own. Get help right away if:  You feel like hurting yourself or someone else. This information is not intended to replace advice given to you by your health care provider. Make sure you discuss any questions you have with your health care provider. Document Released: 06/20/2000 Document Revised: 06/02/2015 Document Reviewed: 08/19/2012 Elsevier Interactive Patient Education  2017 [place]. Insomnia Insomnia is a sleep disorder that makes it difficult to fall asleep or to stay asleep. Insomnia can cause tiredness (fatigue), low energy, difficulty concentrating, mood swings, and poor performance at work or school. There are three different ways to  classify insomnia:  Difficulty falling asleep.  Difficulty staying asleep.  Waking up too early in the morning.  Any type of insomnia can be long-term (chronic) or short-term (acute). Both are common. Short-term insomnia usually lasts for three months or less. Chronic insomnia occurs at least three times a week for longer than three months. What are the causes? Insomnia may be caused by another condition, situation, or substance, such  as:  Anxiety.  Certain medicines.  Gastroesophageal reflux disease (GERD) or other gastrointestinal conditions.  Asthma or other breathing conditions.  Restless legs syndrome, sleep apnea, or other sleep disorders.  Chronic pain.  Menopause. This may include hot flashes.  Stroke.  Abuse of alcohol, tobacco, or illegal drugs.  Depression.  Caffeine.  Neurological disorders, such as Alzheimer disease.  An overactive thyroid (hyperthyroidism).  The cause of insomnia may not be known. What increases the risk? Risk factors for insomnia include:  Gender. Women are more commonly affected than men.  Age. Insomnia is more common as you get older.  Stress. This may involve your professional or personal life.  Income. Insomnia is more common in people with lower income.  Lack of exercise.  Irregular work schedule or night shifts.  Traveling between different time zones.  What are the signs or symptoms? If you have insomnia, trouble falling asleep or trouble staying asleep is the main symptom. This may lead to other symptoms, such as:  Feeling fatigued.  Feeling nervous about going to sleep.  Not feeling rested in the morning.  Having trouble concentrating.  Feeling irritable, anxious, or depressed.  How is this treated? Treatment for insomnia depends on the cause. If your insomnia is caused by an underlying condition, treatment will focus on addressing the condition. Treatment may also include:  Medicines to help you sleep.  Counseling or therapy.  Lifestyle adjustments.  Follow these instructions at home:  Take medicines only as directed by your health care provider.  Keep regular sleeping and waking hours. Avoid naps.  Keep a sleep diary to help you and your health care provider figure out what could be causing your insomnia. Include: ? When you sleep. ? When you wake up during the night. ? How well you sleep. ? How rested you feel the next  day. ? Any side effects of medicines you are taking. ? What you eat and drink.  Make your bedroom a comfortable place where it is easy to fall asleep: ? Put up shades or special blackout curtains to block light from outside. ? Use a white noise machine to block noise. ? Keep the temperature cool.  Exercise regularly as directed by your health care provider. Avoid exercising right before bedtime.  Use relaxation techniques to manage stress. Ask your health care provider to suggest some techniques that may work well for you. These may include: ? Breathing exercises. ? Routines to release muscle tension. ? Visualizing peaceful scenes.  Cut back on alcohol, caffeinated beverages, and cigarettes, especially close to bedtime. These can disrupt your sleep.  Do not overeat or eat spicy foods right before bedtime. This can lead to digestive discomfort that can make it hard for you to sleep.  Limit screen use before bedtime. This includes: ? Watching TV. ? Using your smartphone, tablet, and computer.  Stick to a routine. This can help you fall asleep faster. Try to do a quiet activity, brush your teeth, and go to bed at the same time each night.  Get out of bed if you are  still awake after 15 minutes of trying to sleep. Keep the lights down, but try reading or doing a quiet activity. When you feel sleepy, go back to bed.  Make sure that you drive carefully. Avoid driving if you feel very sleepy.  Keep all follow-up appointments as directed by your health care provider. This is important. Contact a health care provider if:  You are tired throughout the day or have trouble in your daily routine due to sleepiness.  You continue to have sleep problems or your sleep problems get worse. Get help right away if:  You have serious thoughts about hurting yourself or someone else. This information is not intended to replace advice given to you by your health care provider. Make sure you discuss any  questions you have with your health care provider. Document Released: 12/23/1999 Document Revised: 05/27/2015 Document Reviewed: 09/25/2013 Elsevier Interactive Patient Education  [employer].

## 2016-08-22 NOTE — Progress Notes (Signed)
Subjective:  Patient ID: Deanna Salazar, female    DOB: 1940-07-17  Age: 76 y.o. MRN: [id number]  CC: No chief complaint on file.   HPI [employer] presents for GERD, HTN. C/o abd pain w/Pepcid. Pt stopped it... Giinger tea helped  Outpatient Medications Prior to Visit  Medication Sig Dispense Refill  . acetaminophen (TYLENOL) 500 MG tablet Take 500-1,000 mg by mouth every 6 (six) hours as needed for mild pain, moderate pain, fever or headache.    Marland Kitchen acetaminophen-codeine (TYLENOL #3) 300-30 MG tablet Take 1 tablet by mouth every 6 (six) hours as needed for moderate pain. 30 tablet 0  . amLODipine (NORVASC) 5 MG tablet take 1 tablet by mouth once daily 90 tablet 2  . aspirin (ASPIRIN CHILDRENS) 81 MG chewable tablet Chew 1 tablet (81 mg total) by mouth daily. 36 tablet 11  . carvedilol (COREG) 12.5 MG tablet take 1 tablet by mouth twice a day with meals 180 tablet 2  . cholecalciferol (VITAMIN D) 1000 units tablet Take 1,000 Units by mouth daily.    . famotidine (PEPCID) 40 MG tablet Take 1 tablet (40 mg total) by mouth daily. 90 tablet 3  . furosemide (LASIX) 20 MG tablet take 1 tablet by mouth once daily 90 tablet 2  . levothyroxine (SYNTHROID, LEVOTHROID) 25 MCG tablet take 1 tablet by mouth once daily BEFORE BREAKFAST 90 tablet 2  . losartan (COZAAR) 100 MG tablet take 1 tablet by mouth once daily 90 tablet 2  . meclizine (ANTIVERT) 25 MG tablet Take 1 tablet (25 mg total) by mouth 3 (three) times daily as needed for dizziness or nausea. 30 tablet 1  . potassium chloride (K-DUR) 10 MEQ tablet take 1 tablet by mouth once daily 90 tablet 3   Facility-Administered Medications Prior to Visit  Medication Dose Route Frequency Provider Last Rate Last Dose  . 0.9 %  sodium chloride infusion  500 mL Intravenous Continuous Ladene Artist, MD        ROS Review of Systems  Constitutional: Negative for activity change, appetite change, chills, fatigue and unexpected weight change.    HENT: Negative for congestion, mouth sores and sinus pressure.   Eyes: Negative for visual disturbance.  Respiratory: Negative for cough and chest tightness.   Gastrointestinal: Negative for abdominal pain and nausea.  Genitourinary: Negative for difficulty urinating, frequency and vaginal pain.  Musculoskeletal: Negative for back pain and gait problem.  Skin: Negative for pallor and rash.  Neurological: Negative for dizziness, tremors, weakness, numbness and headaches.  Psychiatric/Behavioral: Negative for confusion and sleep disturbance.    Objective:  BP (!) 142/78 (BP Location: Right Arm, Patient Position: Sitting, Cuff Size: Large)   Pulse (!) 59   Temp 98.5 F (36.9 C) (Oral)   Ht 5\' 7"  (1.702 m)   Wt 211 lb (95.7 kg)   SpO2 99%   BMI 33.05 kg/m   BP Readings from Last 3 Encounters:  08/22/16 (!) 142/78  08/12/16 (!) 148/79  05/16/16 138/80    Wt Readings from Last 3 Encounters:  08/22/16 211 lb (95.7 kg)  08/13/16 210 lb (95.3 kg)  08/11/16 210 lb (95.3 kg)    Physical Exam  Constitutional: She appears well-developed. No distress.  HENT:  Head: Normocephalic.  Right Ear: External ear normal.  Left Ear: External ear normal.  Nose: Nose normal.  Mouth/Throat: Oropharynx is clear and moist.  Eyes: Pupils are equal, round, and reactive to light. Conjunctivae are normal. Right eye exhibits no  discharge. Left eye exhibits no discharge.  Neck: Normal range of motion. Neck supple. No JVD present. No tracheal deviation present. No thyromegaly present.  Cardiovascular: Normal rate, regular rhythm and normal heart sounds.   Pulmonary/Chest: No stridor. No respiratory distress. She has no wheezes.  Abdominal: Soft. Bowel sounds are normal. She exhibits no distension and no mass. There is no tenderness. There is no rebound and no guarding.  Musculoskeletal: She exhibits no edema or tenderness.  Lymphadenopathy:    She has no cervical adenopathy.  Neurological: She  displays normal reflexes. No cranial nerve deficit. She exhibits normal muscle tone. Coordination normal.  Skin: No rash noted. No erythema.  Psychiatric: She has a normal mood and affect. Her behavior is normal. Judgment and thought content normal.  limp - R knee  Lab Results  Component Value Date   WBC 6.5 05/16/2016   HGB 12.6 05/16/2016   HCT 38.3 05/16/2016   PLT 218.0 05/16/2016   GLUCOSE 121 (H) 05/16/2016   CHOL 198 07/14/2013   TRIG 82.0 07/14/2013   HDL 46.50 07/14/2013   LDLDIRECT 180.9 07/26/2009   LDLCALC 135 (H) 07/14/2013   ALT 12 05/16/2016   AST 20 05/16/2016   NA 140 05/16/2016   K 4.1 05/16/2016   CL 102 05/16/2016   CREATININE 0.91 05/16/2016   BUN 12 05/16/2016   CO2 31 05/16/2016   TSH 2.38 05/16/2016   HGBA1C 6.5 10/04/2015   MICROALBUR <0.7 10/04/2015    Mr Knee Right W/o Contrast  Result Date: 08/20/2016 CLINICAL DATA:  Severe right knee and lower leg pain after fall approximately 1 week ago. EXAM: MRI OF THE RIGHT KNEE WITHOUT CONTRAST TECHNIQUE: Multiplanar, multisequence MR imaging of the knee was performed. No intravenous contrast was administered. COMPARISON:  Right knee x-rays dated August 11, 2016. FINDINGS: MENISCI Medial meniscus: There is a degenerative, predominantly horizontal tear of the body and posterior horn of the medial meniscus. Lateral meniscus: There is a complete tear near the body/posterior horn junction with extrusion and additional complex tearing of body. LIGAMENTS Cruciates: There is increased intermediate and fluid signal in the ACL at its tibial attachment. The PCL is intact. Collaterals: Medial collateral ligament is intact. Lateral collateral ligament complex is intact. CARTILAGE Patellofemoral: Large areas of full-thickness cartilage loss over the patella and trochlea with prominent underlying subchondral marrow edema and cystic change. Medial: Multifocal areas of full-thickness cartilage loss with large marginal osteophytes.  Lateral: Multifocal areas of full-thickness cartilage loss and delamination with large marginal osteophytes. Joint: Small joint effusion with synovitis. No definite intra-articular loose bodies. No plical thickening. Normal Hoffa's fat. Popliteal Fossa:  No Baker cyst.  Intact popliteus tendon. Extensor Mechanism: Increased intermediate signal within the distal quadriceps tendon and proximal and distal patellar tendon. There is a thin linear focus of fluid intensity signal in the distal patellar tendon (series 6, image 10), which may represent a partial interstitial tear. Bones:  No fracture or dislocation.  No marrow replacing lesion. Other: None. IMPRESSION: 1. Degenerative, predominantly horizontal tear of the body and posterior horn of the medial meniscus. 2. Complete tear of the lateral meniscus body/posterior horn junction with extrusion and additional complex tearing of the body. 3. Increased signal in the ACL at its tibial attachment, which may be related to sprain or partial tearing. 4. Severe tricompartmental degenerative changes. 5. Quadriceps and patellar tendinosis with possible low-grade interstitial tear of the distal patellar tendon. Electronically Signed   By: Titus Dubin M.D.   On: 08/20/2016  13:17    Assessment & Plan:   There are no diagnoses linked to this encounter. I am having Ms. Mcbroom maintain her aspirin, potassium chloride, meclizine, carvedilol, losartan, famotidine, amLODipine, furosemide, levothyroxine, cholecalciferol, acetaminophen, and acetaminophen-codeine. We will continue to administer sodium chloride.  No orders of the defined types were placed in this encounter.    Follow-up: No Follow-up on file.  Walker Kehr, MD

## 2016-08-22 NOTE — Assessment & Plan Note (Signed)
  On diet  

## 2016-08-22 NOTE — Assessment & Plan Note (Addendum)
Pepcid - d/c'd due to abd pain. On ginger tea now GI ref offered - declined

## 2016-08-23 ENCOUNTER — Ambulatory Visit (INDEPENDENT_AMBULATORY_CARE_PROVIDER_SITE_OTHER): Payer: PPO | Admitting: Orthopedic Surgery

## 2016-08-23 ENCOUNTER — Encounter (INDEPENDENT_AMBULATORY_CARE_PROVIDER_SITE_OTHER): Payer: Self-pay | Admitting: Orthopedic Surgery

## 2016-08-23 DIAGNOSIS — M1711 Unilateral primary osteoarthritis, right knee: Secondary | ICD-10-CM | POA: Diagnosis not present

## 2016-08-23 MED ORDER — LIDOCAINE HCL 1 % IJ SOLN
5.0000 mL | INTRAMUSCULAR | Status: AC | PRN
  Administered 2016-08-23: 5 mL

## 2016-08-23 MED ORDER — METHOCARBAMOL 500 MG PO TABS: 500.0000 mg | ORAL_TABLET | Freq: Three times a day (TID) | ORAL | 0 refills | Status: DC | PRN

## 2016-08-23 MED ORDER — METHYLPREDNISOLONE ACETATE 40 MG/ML IJ SUSP
40.0000 mg | INTRAMUSCULAR | Status: AC | PRN
  Administered 2016-08-23: 40 mg via INTRA_ARTICULAR

## 2016-08-23 NOTE — Progress Notes (Signed)
Office Visit Note   Patient: Deanna Salazar           Date of Birth: 1940-10-12           MRN: [id number] Visit Date: 08/23/2016              Requested by: Cassandria Anger, MD [place], [place] [address] PCP: Cassandria Anger, MD  Chief Complaint  Patient presents with  . Right Knee - Pain, Follow-up      HPI: Patient is a 76 year old woman who presents in follow-up for an MRI scan of her right knee she complains of mechanical catching locking snapping popping and giving way of the right knee. She complains of swelling pain with weightbearing. Patient states that she is not interested in any type of surgical intervention at this time.  Assessment & Plan: Visit Diagnoses:  1. Unilateral primary osteoarthritis, right knee     Plan: Right knee was injected plan to follow-up in 4 weeks. With patient's significant tricompartmental arthritis as well as medial and lateral meniscal tears discussed that she could possibly have some relief with arthroscopic intervention however her best option would be to proceed with a total knee replacement. Reevaluate at follow-up.  Follow-Up Instructions: Return in about 4 weeks (around 09/20/2016).   Ortho Exam  Patient is alert, oriented, no adenopathy, well-dressed, normal affect, normal respiratory effort. Examination patient has an antalgic gait she has a mild effusion in the right knee she is tender to palpation of the medial and lateral joint line collaterals and cruciates are stable she has a stable extension the knee with no extensor lag. Examination of the MRI scan shows medial and lateral meniscal tears as well as tricompartmental arthritic changes.  Imaging: No results found. No images are attached to the encounter.  Labs: Lab Results  Component Value Date   HGBA1C 6.3 08/22/2016   HGBA1C 6.5 10/04/2015   HGBA1C 6.6 (H) 04/05/2015   ESRSEDRATE 26 (H) 03/12/2013   ESRSEDRATE 26 (H) 07/26/2009   ESRSEDRATE 31 (H)  03/03/2009   LABORGA No Salmonella,Shigella,Campylobacter or Yersinia 03/24/2010   LABORGA isolated. 03/24/2010    Orders:  No orders of the defined types were placed in this encounter.  Meds ordered this encounter  Medications  . methocarbamol (ROBAXIN) 500 MG tablet    Sig: Take 1 tablet (500 mg total) by mouth every 8 (eight) hours as needed for muscle spasms.    Dispense:  30 tablet    Refill:  0     Procedures: Large Joint Inj Date/Time: 08/23/2016 11:01 AM Performed by: Mazi Schuff V Authorized by: Newt Minion   Consent Given by:  Patient Site marked: the procedure site was marked   Timeout: prior to procedure the correct patient, procedure, and site was verified   Indications:  Pain and diagnostic evaluation Location:  Knee Site:  R knee Prep: patient was prepped and draped in usual sterile fashion   Needle Size:  22 G Needle Length:  1.5 inches Approach:  Anteromedial Ultrasound Guidance: No   Fluoroscopic Guidance: No   Arthrogram: No   Medications:  5 mL lidocaine 1 %; 40 mg methylPREDNISolone acetate 40 MG/ML Aspiration Attempted: No   Patient tolerance:  Patient tolerated the procedure well with no immediate complications    Clinical Data: No additional findings.  ROS:  All other systems negative, except as noted in the HPI. Review of Systems  Objective: Vital Signs: There were no vitals taken for  this visit.  Specialty Comments:  No specialty comments available.  PMFS History: Patient Active Problem List   Diagnosis Date Noted  . Unilateral primary osteoarthritis, right knee 08/23/2016  . Hypokalemia 05/16/2016  . Rhonchi 02/09/2016  . Intertrigo 10/04/2015  . Allergic rhinitis 04/05/2015  . Well adult exam 06/28/2014  . Tinea pedis 10/13/2013  . Near syncope 09/28/2013  . LBP (low back pain) 09/24/2012  . Diabetes type 2, controlled ([place]) 11/06/2011  . Breast cancer ([facility]) 11/06/2011  . URI, acute 03/02/2011  . Flank pain  03/02/2011  . Hyperglycemia 03/02/2011  . Edema 10/30/2010  . Dyspnea and respiratory abnormalities 10/30/2010  . Cramp in limb 10/30/2010  . Bronchitis 10/06/2010  . NAUSEA ALONE 03/23/2010  . RENAL CYST, LEFT 02/23/2010  . Chest pain 02/23/2010  . Dyslipidemia 07/26/2009  . ABDOMINAL PAIN-EPIGASTRIC 03/24/2009  . HIP PAIN 07/20/2008  . BACK PAIN 07/20/2008  . FEVER, NOS 07/20/2008  . PARESTHESIA 04/06/2008  . Dysuria 04/06/2008  . Dysphagia, pharyngoesophageal phase 02/05/2008  . ABDOMINAL PAIN, GENERALIZED 02/05/2008  . HYPOKALEMIA 11/25/2007  . DEPRESSION 11/25/2007  . COLONIC POLYPS, ADENOMATOUS, HX OF 10/01/2007  . 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 KELOID 07/25/2007  . MURMUR 07/25/2007  . HEMORRHOIDS, INTERNAL 05/10/2007  . CONSTIPATION, CHRONIC 05/10/2007  . BREAST CANCER, HX OF 04/16/2007  . ACUTE BRONCHITIS 02/15/2007  . Hypothyroidism 12/26/2006  . INSOMNIA, PERSISTENT 12/26/2006  . Essential hypertension 12/26/2006  . Chronic fatigue 12/26/2006  . GERD 11/19/2006  . IBS 11/19/2006  . FATTY LIVER DISEASE 11/19/2006  . Rheumatoid arthritis ([facility]) 11/19/2006  . Fibromyalgia 11/19/2006   Past Medical History:  Diagnosis Date  . Abdominal pain, generalized 02/05/2008  . ABDOMINAL PAIN-EPIGASTRIC 03/24/2009  . Acute bronchitis 02/15/2007  . Adenomatous colon polyp 04/2000  . Arthritis   . ASCUS favor benign 10/2011   negative high risk HPV screen  . BACK PAIN 07/20/2008  . Breast cancer ([facility])    l, hx of XRT Dr Benay Spice, Left side  . BREAST CANCER, HX OF 04/16/2007  . CHEST PAIN 02/23/2010  . COLONIC POLYPS, ADENOMATOUS, HX OF 10/01/2007  . CONSTIPATION, CHRONIC 05/10/2007  . Dysuria 04/06/2008  . FATIGUE 12/26/2006  . Fatty liver   . FATTY LIVER DISEASE 11/19/2006  . FIBROMYALGIA 11/19/2006  . GERD 11/19/2006   Dr Fuller Plan  . HEMATOCHEZIA 05/10/2007  . HEMORRHOIDS, INTERNAL 05/10/2007  . HIP PAIN 07/20/2008  . HYPERLIPIDEMIA 07/26/2009  .  HYPERTENSION 12/26/2006  . HYPOKALEMIA 11/25/2007  . HYPOTHYROIDISM 12/26/2006  . IBS 11/19/2006  . INSOMNIA, PERSISTENT 12/26/2006  . KELOID 07/25/2007  . MURMUR 07/25/2007  . Nausea alone 03/23/2010  . PARESTHESIA 04/06/2008  . RENAL CYST, LEFT 02/23/2010  . Rheumatoid arthritis(714.0) 11/19/2006   Dr Vianne Bulls  . RUQ PAIN 11/07/2009  . Tubular adenoma   . VISION IMPAIRMENT, LOW VISION, ONE EYE-LEFT 07/25/2007    Family History  Problem Relation Age of Onset  . Lung cancer Brother   . Colon cancer Brother   . Diabetes Father   . Brain cancer Brother   . Stroke Sister     Past Surgical History:  Procedure Laterality Date  . BREAST SURGERY  2009   Left lumpectomy  . Cyst excision from chest    . knee surgery Left   . Left thyroidectomy    . OS Cataract extraction  2009  . OVARIAN CYST REMOVAL  age 23  . SHOULDER SURGERY     both shoulders  . TOE  SURGERY    . TUBAL LIGATION     Social History   Occupational History  . retired Retired   Social History Main Topics  . Smoking status: Never Smoker  . Smokeless tobacco: Never Used  . Alcohol use No  . Drug use: No  . Sexual activity: No     Comment: 1st intercourse 76 yo-Fewer than 5 partners

## 2016-08-27 ENCOUNTER — Telehealth: Payer: Self-pay

## 2016-08-27 NOTE — Telephone Encounter (Signed)
-----   Message from Cassandria Anger, MD sent at 08/23/2016 10:15 PM EDT ----- Deanna Salazar, Please inform the patient that all labs are stable. Thanks, AP

## 2016-08-27 NOTE — Telephone Encounter (Signed)
Pt has been informed and expressed understanding.  

## 2016-09-20 ENCOUNTER — Encounter (INDEPENDENT_AMBULATORY_CARE_PROVIDER_SITE_OTHER): Payer: Self-pay | Admitting: Orthopedic Surgery

## 2016-09-20 ENCOUNTER — Ambulatory Visit (INDEPENDENT_AMBULATORY_CARE_PROVIDER_SITE_OTHER): Payer: PPO | Admitting: Orthopedic Surgery

## 2016-09-20 DIAGNOSIS — M5416 Radiculopathy, lumbar region: Secondary | ICD-10-CM

## 2016-09-20 DIAGNOSIS — M1711 Unilateral primary osteoarthritis, right knee: Secondary | ICD-10-CM

## 2016-09-20 NOTE — Progress Notes (Signed)
Office Visit Note   Patient: Deanna Salazar           Date of Birth: [birth year]           MRN: [id number] Visit Date: 09/20/2016              Requested by: Cassandria Anger, MD [place], [place] [address] PCP: Cassandria Anger, MD  Chief Complaint  Patient presents with  . Right Knee - Follow-up      HPI: Patient is a 76 year old woman who presents in follow-up for osteoarthritis right knee. Complains of mechanical catching locking snapping popping and giving way of the right knee. She complains of swelling and pain with weightbearing. Patient states that she is not interested in any type of surgical intervention at this time. Does request a knee brace for stability.   Also having burning pain from right hip down to her ankle laterally, occasional numbness. Has been chronic problem. Recent pred taper without relief. Did have ESI many years ago, difficult to recall whether was helpful. MRI reviewed from 2014.  Her husband is currently on hospice, states she is just trying to be there for him and will put off treatments for herself at this time.   Assessment & Plan: Visit Diagnoses:  1. Unilateral primary osteoarthritis, right knee   2. Lumbar radiculopathy     Plan: Patient not interested in surgical intervention at this time. Will provide hinged knee brace for ambulation. Referral to [facility] for [facility] if she is interested in trying.  Follow-Up Instructions: Return if symptoms worsen or fail to improve.   Ortho Exam  Patient is alert, oriented, no adenopathy, well-dressed, normal affect, normal respiratory effort. Lumbar spine nontender. Does have positive straight leg raise on right.  Examination patient has an antalgic gait she has a mild effusion in the right knee she is tender to palpation of the medial and lateral joint line collaterals and cruciates are stable she has a stable extension the knee with no extensor lag.   Imaging: No results found. No  images are attached to the encounter.  Labs: Lab Results  Component Value Date   HGBA1C 6.3 08/22/2016   HGBA1C 6.5 10/04/2015   HGBA1C 6.6 (H) 04/05/2015   ESRSEDRATE 26 (H) 03/12/2013   ESRSEDRATE 26 (H) 07/26/2009   ESRSEDRATE 31 (H) 03/03/2009   LABORGA No Salmonella,Shigella,Campylobacter or Yersinia 03/24/2010   LABORGA isolated. 03/24/2010    Orders:  Orders Placed This Encounter  Procedures  . Ambulatory referral to Physical Medicine Rehab   No orders of the defined types were placed in this encounter.    Procedures: No procedures performed  Clinical Data: No additional findings.  ROS:  All other systems negative, except as noted in the HPI. Review of Systems  Constitutional: Negative for chills and fever.  Musculoskeletal: Positive for arthralgias, back pain, joint swelling and myalgias.  Neurological: Positive for numbness. Negative for weakness.    Objective: Vital Signs: There were no vitals taken for this visit.  Specialty Comments:  No specialty comments available.  PMFS History: Patient Active Problem List   Diagnosis Date Noted  . Unilateral primary osteoarthritis, right knee 08/23/2016  . Hypokalemia 05/16/2016  . Rhonchi 02/09/2016  . Intertrigo 10/04/2015  . Allergic rhinitis 04/05/2015  . Well adult exam 06/28/2014  . Tinea pedis 10/13/2013  . Near syncope 09/28/2013  . LBP (low back pain) 09/24/2012  . Diabetes type 2, controlled ([place]) 11/06/2011  . Breast cancer ([facility])  11/06/2011  . URI, acute 03/02/2011  . Flank pain 03/02/2011  . Hyperglycemia 03/02/2011  . Edema 10/30/2010  . Dyspnea and respiratory abnormalities 10/30/2010  . Cramp in limb 10/30/2010  . Bronchitis 10/06/2010  . NAUSEA ALONE 03/23/2010  . RENAL CYST, LEFT 02/23/2010  . Chest pain 02/23/2010  . Dyslipidemia 07/26/2009  . ABDOMINAL PAIN-EPIGASTRIC 03/24/2009  . HIP PAIN 07/20/2008  . BACK PAIN 07/20/2008  . FEVER, NOS 07/20/2008  . PARESTHESIA 04/06/2008    . Dysuria 04/06/2008  . Dysphagia, pharyngoesophageal phase 02/05/2008  . ABDOMINAL PAIN, GENERALIZED 02/05/2008  . HYPOKALEMIA 11/25/2007  . DEPRESSION 11/25/2007  . COLONIC POLYPS, ADENOMATOUS, HX OF 10/01/2007  . 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 KELOID 07/25/2007  . MURMUR 07/25/2007  . HEMORRHOIDS, INTERNAL 05/10/2007  . CONSTIPATION, CHRONIC 05/10/2007  . BREAST CANCER, HX OF 04/16/2007  . ACUTE BRONCHITIS 02/15/2007  . Hypothyroidism 12/26/2006  . INSOMNIA, PERSISTENT 12/26/2006  . Essential hypertension 12/26/2006  . Chronic fatigue 12/26/2006  . GERD 11/19/2006  . IBS 11/19/2006  . FATTY LIVER DISEASE 11/19/2006  . Rheumatoid arthritis ([facility]) 11/19/2006  . Fibromyalgia 11/19/2006   Past Medical History:  Diagnosis Date  . Abdominal pain, generalized 02/05/2008  . ABDOMINAL PAIN-EPIGASTRIC 03/24/2009  . Acute bronchitis 02/15/2007  . Adenomatous colon polyp 04/2000  . Arthritis   . ASCUS favor benign 10/2011   negative high risk HPV screen  . BACK PAIN 07/20/2008  . Breast cancer ([facility])    l, hx of XRT Dr Benay Spice, Left side  . BREAST CANCER, HX OF 04/16/2007  . CHEST PAIN 02/23/2010  . COLONIC POLYPS, ADENOMATOUS, HX OF 10/01/2007  . CONSTIPATION, CHRONIC 05/10/2007  . Dysuria 04/06/2008  . FATIGUE 12/26/2006  . Fatty liver   . FATTY LIVER DISEASE 11/19/2006  . FIBROMYALGIA 11/19/2006  . GERD 11/19/2006   Dr Fuller Plan  . HEMATOCHEZIA 05/10/2007  . HEMORRHOIDS, INTERNAL 05/10/2007  . HIP PAIN 07/20/2008  . HYPERLIPIDEMIA 07/26/2009  . HYPERTENSION 12/26/2006  . HYPOKALEMIA 11/25/2007  . HYPOTHYROIDISM 12/26/2006  . IBS 11/19/2006  . INSOMNIA, PERSISTENT 12/26/2006  . KELOID 07/25/2007  . MURMUR 07/25/2007  . Nausea alone 03/23/2010  . PARESTHESIA 04/06/2008  . RENAL CYST, LEFT 02/23/2010  . Rheumatoid arthritis(714.0) 11/19/2006   Dr Vianne Bulls  . RUQ PAIN 11/07/2009  . Tubular adenoma   . VISION IMPAIRMENT, LOW VISION, ONE EYE-LEFT 07/25/2007     Family History  Problem Relation Age of Onset  . Lung cancer Brother   . Colon cancer Brother   . Diabetes Father   . Brain cancer Brother   . Stroke Sister     Past Surgical History:  Procedure Laterality Date  . BREAST SURGERY  2009   Left lumpectomy  . Cyst excision from chest    . knee surgery Left   . Left thyroidectomy    . OS Cataract extraction  2009  . OVARIAN CYST REMOVAL  age 86  . SHOULDER SURGERY     both shoulders  . TOE SURGERY    . TUBAL LIGATION     Social History   Occupational History  . retired Retired   Social History Main Topics  . Smoking status: Never Smoker  . Smokeless tobacco: Never Used  . Alcohol use No  . Drug use: No  . Sexual activity: No     Comment: 1st intercourse 76 yo-Fewer than 5 partners

## 2016-10-12 ENCOUNTER — Encounter (INDEPENDENT_AMBULATORY_CARE_PROVIDER_SITE_OTHER): Payer: Self-pay

## 2016-11-23 ENCOUNTER — Ambulatory Visit: Payer: PPO | Admitting: Internal Medicine

## 2016-11-23 ENCOUNTER — Encounter: Payer: Self-pay | Admitting: Internal Medicine

## 2016-11-23 VITALS — BP 138/80 | HR 65 | Temp 98.4°F | Ht 67.0 in | Wt 209.0 lb

## 2016-11-23 DIAGNOSIS — M06031 Rheumatoid arthritis without rheumatoid factor, right wrist: Secondary | ICD-10-CM

## 2016-11-23 DIAGNOSIS — Z23 Encounter for immunization: Secondary | ICD-10-CM

## 2016-11-23 DIAGNOSIS — F4321 Adjustment disorder with depressed mood: Secondary | ICD-10-CM | POA: Diagnosis not present

## 2016-11-23 DIAGNOSIS — I10 Essential (primary) hypertension: Secondary | ICD-10-CM

## 2016-11-23 DIAGNOSIS — E119 Type 2 diabetes mellitus without complications: Secondary | ICD-10-CM

## 2016-11-23 DIAGNOSIS — M06032 Rheumatoid arthritis without rheumatoid factor, left wrist: Secondary | ICD-10-CM

## 2016-11-23 MED ORDER — CARVEDILOL 12.5 MG PO TABS: 12.5000 mg | ORAL_TABLET | Freq: Two times a day (BID) | ORAL | 3 refills | Status: DC

## 2016-11-23 MED ORDER — TRAZODONE HCL 50 MG PO TABS: 25.0000 mg | ORAL_TABLET | Freq: Every evening | ORAL | 5 refills | Status: DC | PRN

## 2016-11-23 NOTE — Assessment & Plan Note (Signed)
Losartan, amlodipine, Coreg 

## 2016-11-23 NOTE — Assessment & Plan Note (Signed)
F/u w/Dr Beekman 

## 2016-11-23 NOTE — Assessment & Plan Note (Signed)
husband died in Nov 2018 Trazodone at [facility] via [facility] prn

## 2016-11-23 NOTE — Assessment & Plan Note (Signed)
  On diet  

## 2016-11-23 NOTE — Progress Notes (Signed)
Subjective:  Patient ID: Deanna Salazar, female    DOB: August 30, 1940  Age: 76 y.o. MRN: [id number]  CC: No chief complaint on file.   HPI Deanna Salazar presents for grief, HTN, hypothyroidism.  C/o grief - husband died in 01/02/17   Outpatient Medications Prior to Visit  Medication Sig Dispense Refill  . acetaminophen (TYLENOL) 500 MG tablet Take 500-1,000 mg by mouth every 6 (six) hours as needed for mild pain, moderate pain, fever or headache.    Marland Kitchen acetaminophen-codeine (TYLENOL #3) 300-30 MG tablet Take 1 tablet by mouth every 6 (six) hours as needed for moderate pain. 30 tablet 0  . amLODipine (NORVASC) 5 MG tablet Take 1 tablet (5 mg total) by mouth daily. 90 tablet 2  . aspirin (ASPIRIN CHILDRENS) 81 MG chewable tablet Chew 1 tablet (81 mg total) by mouth daily. 36 tablet 11  . carvedilol (COREG) 12.5 MG tablet take 1 tablet by mouth twice a day with meals 180 tablet 2  . cholecalciferol (VITAMIN D) 1000 units tablet Take 1,000 Units by mouth daily.    . furosemide (LASIX) 20 MG tablet take 1 tablet by mouth once daily 90 tablet 2  . levothyroxine (SYNTHROID, LEVOTHROID) 25 MCG tablet take 1 tablet by mouth once daily BEFORE BREAKFAST 90 tablet 2  . losartan (COZAAR) 100 MG tablet take 1 tablet by mouth once daily 90 tablet 2  . meclizine (ANTIVERT) 25 MG tablet Take 1 tablet (25 mg total) by mouth 3 (three) times daily as needed for dizziness or nausea. 30 tablet 1  . methocarbamol (ROBAXIN) 500 MG tablet Take 1 tablet (500 mg total) by mouth every 8 (eight) hours as needed for muscle spasms. 30 tablet 0  . potassium chloride (K-DUR) 10 MEQ tablet take 1 tablet by mouth once daily 90 tablet 3   Facility-Administered Medications Prior to Visit  Medication Dose Route Frequency Provider Last Rate Last Dose  . 0.9 %  sodium chloride infusion  500 mL Intravenous Continuous Ladene Artist, MD        ROS Review of Systems  Constitutional: Negative for activity change, appetite  change, chills, fatigue and unexpected weight change.  HENT: Negative for congestion, mouth sores and sinus pressure.   Eyes: Negative for visual disturbance.  Respiratory: Negative for cough and chest tightness.   Gastrointestinal: Negative for abdominal pain and nausea.  Genitourinary: Negative for difficulty urinating, frequency and vaginal pain.  Musculoskeletal: Negative for back pain and gait problem.  Skin: Negative for pallor and rash.  Neurological: Negative for dizziness, tremors, weakness, numbness and headaches.  Psychiatric/Behavioral: Positive for sleep disturbance. Negative for confusion and suicidal ideas. The patient is nervous/anxious.     Objective:  BP 138/80 (BP Location: Left Arm, Patient Position: Sitting, Cuff Size: Large)   Pulse 65   Temp 98.4 F (36.9 C) (Oral)   Ht 5\' 7"  (1.702 m)   Wt 209 lb (94.8 kg)   SpO2 99%   BMI 32.73 kg/m   BP Readings from Last 3 Encounters:  11/23/16 138/80  08/22/16 (!) 142/78  08/12/16 (!) 148/79    Wt Readings from Last 3 Encounters:  11/23/16 209 lb (94.8 kg)  08/22/16 211 lb (95.7 kg)  08/13/16 210 lb (95.3 kg)    Physical Exam  Constitutional: She appears well-developed. No distress.  HENT:  Head: Normocephalic.  Right Ear: External ear normal.  Left Ear: External ear normal.  Nose: Nose normal.  Mouth/Throat: Oropharynx is clear and moist.  Eyes: Conjunctivae are normal. Pupils are equal, round, and reactive to light. Right eye exhibits no discharge. Left eye exhibits no discharge.  Neck: Normal range of motion. Neck supple. No JVD present. No tracheal deviation present. No thyromegaly present.  Cardiovascular: Normal rate, regular rhythm and normal heart sounds.  Pulmonary/Chest: No stridor. No respiratory distress. She has no wheezes.  Abdominal: Soft. Bowel sounds are normal. She exhibits no distension and no mass. There is no tenderness. There is no rebound and no guarding.  Musculoskeletal: She exhibits  no edema or tenderness.  Lymphadenopathy:    She has no cervical adenopathy.  Neurological: She displays normal reflexes. No cranial nerve deficit. She exhibits normal muscle tone. Coordination normal.  Skin: No rash noted. No erythema.  Psychiatric: She has a normal mood and affect. Her behavior is normal. Judgment and thought content normal.  Obese Sad  Lab Results  Component Value Date   WBC 6.5 05/16/2016   HGB 12.6 05/16/2016   HCT 38.3 05/16/2016   PLT 218.0 05/16/2016   GLUCOSE 113 (H) 08/22/2016   CHOL 198 07/14/2013   TRIG 82.0 07/14/2013   HDL 46.50 07/14/2013   LDLDIRECT 180.9 07/26/2009   LDLCALC 135 (H) 07/14/2013   ALT 14 08/22/2016   AST 23 08/22/2016   NA 140 08/22/2016   K 4.1 08/22/2016   CL 106 08/22/2016   CREATININE 0.89 08/22/2016   BUN 15 08/22/2016   CO2 27 08/22/2016   TSH 2.38 05/16/2016   HGBA1C 6.3 08/22/2016   MICROALBUR 0.7 08/22/2016    Mr Knee Right W/o Contrast  Result Date: 08/20/2016 CLINICAL DATA:  Severe right knee and lower leg pain after fall approximately 1 week ago. EXAM: MRI OF THE RIGHT KNEE WITHOUT CONTRAST TECHNIQUE: Multiplanar, multisequence MR imaging of the knee was performed. No intravenous contrast was administered. COMPARISON:  Right knee x-rays dated August 11, 2016. FINDINGS: MENISCI Medial meniscus: There is a degenerative, predominantly horizontal tear of the body and posterior horn of the medial meniscus. Lateral meniscus: There is a complete tear near the body/posterior horn junction with extrusion and additional complex tearing of body. LIGAMENTS Cruciates: There is increased intermediate and fluid signal in the ACL at its tibial attachment. The PCL is intact. Collaterals: Medial collateral ligament is intact. Lateral collateral ligament complex is intact. CARTILAGE Patellofemoral: Large areas of full-thickness cartilage loss over the patella and trochlea with prominent underlying subchondral marrow edema and cystic change.  Medial: Multifocal areas of full-thickness cartilage loss with large marginal osteophytes. Lateral: Multifocal areas of full-thickness cartilage loss and delamination with large marginal osteophytes. Joint: Small joint effusion with synovitis. No definite intra-articular loose bodies. No plical thickening. Normal Hoffa's fat. Popliteal Fossa:  No Baker cyst.  Intact popliteus tendon. Extensor Mechanism: Increased intermediate signal within the distal quadriceps tendon and proximal and distal patellar tendon. There is a thin linear focus of fluid intensity signal in the distal patellar tendon (series 6, image 10), which may represent a partial interstitial tear. Bones:  No fracture or dislocation.  No marrow replacing lesion. Other: None. IMPRESSION: 1. Degenerative, predominantly horizontal tear of the body and posterior horn of the medial meniscus. 2. Complete tear of the lateral meniscus body/posterior horn junction with extrusion and additional complex tearing of the body. 3. Increased signal in the ACL at its tibial attachment, which may be related to sprain or partial tearing. 4. Severe tricompartmental degenerative changes. 5. Quadriceps and patellar tendinosis with possible low-grade interstitial tear of the distal patellar tendon.  Electronically Signed   By: Titus Dubin M.D.   On: 08/20/2016 13:17    Assessment & Plan:   Diagnoses and all orders for this visit:  Need for influenza vaccination -     Flu vaccine HIGH DOSE PF (Fluzone High dose)   I am having Deanna Salazar maintain her aspirin, potassium chloride, meclizine, carvedilol, losartan, furosemide, levothyroxine, cholecalciferol, acetaminophen, acetaminophen-codeine, amLODipine, and methocarbamol. We will continue to administer sodium chloride.  No orders of the defined types were placed in this encounter.    Follow-up: No Follow-up on file.  Walker Kehr, MD

## 2016-12-04 ENCOUNTER — Telehealth: Payer: Self-pay | Admitting: Internal Medicine

## 2016-12-04 NOTE — Telephone Encounter (Signed)
Copied from [id number]. Topic: Quick Communication - See Telephone Encounter >> Dec 04, 2016  3:27 PM Robina Ade, Helene Kelp D wrote: CRM for notification. See Telephone encounter for: 12/04/16. Patient is taking traZODone (DESYREL) 50 MG tablet for sleep and wanted to know if she can change it to two pills before bed time. Please call patient back, thanks.

## 2016-12-04 NOTE — Telephone Encounter (Signed)
Pt requesting advice with trazodone

## 2016-12-05 MED ORDER — TRAZODONE HCL 50 MG PO TABS: 100.0000 mg | ORAL_TABLET | Freq: Every evening | ORAL | 5 refills | Status: DC | PRN

## 2016-12-05 NOTE — Telephone Encounter (Signed)
Ok Thx 

## 2016-12-05 NOTE — Telephone Encounter (Signed)
See message below °

## 2016-12-06 NOTE — Telephone Encounter (Signed)
LM notifying pt

## 2016-12-21 ENCOUNTER — Other Ambulatory Visit: Payer: Self-pay | Admitting: Internal Medicine

## 2017-01-02 ENCOUNTER — Other Ambulatory Visit: Payer: Self-pay | Admitting: Internal Medicine

## 2017-01-02 DIAGNOSIS — Z139 Encounter for screening, unspecified: Secondary | ICD-10-CM

## 2017-01-23 ENCOUNTER — Other Ambulatory Visit (INDEPENDENT_AMBULATORY_CARE_PROVIDER_SITE_OTHER): Payer: PPO

## 2017-01-23 ENCOUNTER — Ambulatory Visit (INDEPENDENT_AMBULATORY_CARE_PROVIDER_SITE_OTHER): Payer: PPO | Admitting: Internal Medicine

## 2017-01-23 ENCOUNTER — Encounter: Payer: Self-pay | Admitting: Internal Medicine

## 2017-01-23 DIAGNOSIS — E119 Type 2 diabetes mellitus without complications: Secondary | ICD-10-CM | POA: Diagnosis not present

## 2017-01-23 DIAGNOSIS — F4321 Adjustment disorder with depressed mood: Secondary | ICD-10-CM

## 2017-01-23 DIAGNOSIS — F329 Major depressive disorder, single episode, unspecified: Secondary | ICD-10-CM

## 2017-01-23 DIAGNOSIS — C50412 Malignant neoplasm of upper-outer quadrant of left female breast: Secondary | ICD-10-CM

## 2017-01-23 DIAGNOSIS — I10 Essential (primary) hypertension: Secondary | ICD-10-CM | POA: Diagnosis not present

## 2017-01-23 DIAGNOSIS — G4700 Insomnia, unspecified: Secondary | ICD-10-CM

## 2017-01-23 LAB — URINALYSIS, ROUTINE W REFLEX MICROSCOPIC
BILIRUBIN URINE: NEGATIVE
KETONES UR: NEGATIVE
LEUKOCYTES UA: NEGATIVE
NITRITE: NEGATIVE
Specific Gravity, Urine: 1.01 (ref 1.000–1.030)
Total Protein, Urine: NEGATIVE
UROBILINOGEN UA: 0.2 (ref 0.0–1.0)
Urine Glucose: NEGATIVE
pH: 6 (ref 5.0–8.0)

## 2017-01-23 LAB — BASIC METABOLIC PANEL
BUN: 12 mg/dL (ref 6–23)
CHLORIDE: 102 meq/L (ref 96–112)
CO2: 30 meq/L (ref 19–32)
Calcium: 9.8 mg/dL (ref 8.4–10.5)
Creatinine, Ser: 0.92 mg/dL (ref 0.40–1.20)
GFR: 76.04 mL/min (ref >60.00)
GLUCOSE: 123 mg/dL — AB (ref 70–99)
POTASSIUM: 4 meq/L (ref 3.5–5.1)
SODIUM: 138 meq/L (ref 135–145)

## 2017-01-23 LAB — HEMOGLOBIN A1C: Hgb A1c MFr Bld: 6.3 % (ref 4.6–6.5)

## 2017-01-23 NOTE — Patient Instructions (Signed)
MC well w/Jill 

## 2017-01-23 NOTE — Assessment & Plan Note (Signed)
Trazodone Discussed

## 2017-01-23 NOTE — Assessment & Plan Note (Signed)
Trazodone  °

## 2017-01-23 NOTE — Assessment & Plan Note (Signed)
mammo

## 2017-01-23 NOTE — Progress Notes (Signed)
Subjective:  Patient ID: Deanna Salazar, female    DOB: 11-05-1940  Age: 77 y.o. MRN: [id number]  CC: No chief complaint on file.   HPI Deanna Salazar presents for insomnia, grief, HTN f/u  Outpatient Medications Prior to Visit  Medication Sig Dispense Refill  . acetaminophen (TYLENOL) 500 MG tablet Take 500-1,000 mg by mouth every 6 (six) hours as needed for mild pain, moderate pain, fever or headache.    Marland Kitchen amLODipine (NORVASC) 5 MG tablet Take 1 tablet (5 mg total) by mouth daily. 90 tablet 2  . aspirin (ASPIRIN CHILDRENS) 81 MG chewable tablet Chew 1 tablet (81 mg total) by mouth daily. 36 tablet 11  . carvedilol (COREG) 12.5 MG tablet Take 1 tablet (12.5 mg total) 2 (two) times daily with a meal by mouth. 180 tablet 3  . cholecalciferol (VITAMIN D) 1000 units tablet Take 1,000 Units by mouth daily.    . furosemide (LASIX) 20 MG tablet take 1 tablet by mouth once daily 90 tablet 2  . levothyroxine (SYNTHROID, LEVOTHROID) 25 MCG tablet take 1 tablet by mouth once daily BEFORE BREAKFAST 90 tablet 2  . losartan (COZAAR) 100 MG tablet take 1 tablet by mouth once daily 90 tablet 2  . methocarbamol (ROBAXIN) 500 MG tablet Take 1 tablet (500 mg total) by mouth every 8 (eight) hours as needed for muscle spasms. 30 tablet 0  . potassium chloride (K-DUR) 10 MEQ tablet take 1 tablet by mouth once daily 90 tablet 3  . traZODone (DESYREL) 50 MG tablet Take 2 tablets (100 mg total) by mouth at bedtime as needed for sleep. 60 tablet 5  . acetaminophen-codeine (TYLENOL #3) 300-30 MG tablet Take 1 tablet by mouth every 6 (six) hours as needed for moderate pain. (Patient not taking: Reported on 01/23/2017) 30 tablet 0   Facility-Administered Medications Prior to Visit  Medication Dose Route Frequency Provider Last Rate Last Dose  . 0.9 %  sodium chloride infusion  500 mL Intravenous Continuous Ladene Artist, MD        ROS Review of Systems  Constitutional: Negative for activity change, appetite  change, chills, fatigue and unexpected weight change.  HENT: Negative for congestion, mouth sores and sinus pressure.   Eyes: Negative for visual disturbance.  Respiratory: Negative for cough and chest tightness.   Gastrointestinal: Negative for abdominal pain and nausea.  Genitourinary: Negative for difficulty urinating, frequency and vaginal pain.  Musculoskeletal: Positive for arthralgias and back pain. Negative for gait problem.  Skin: Negative for pallor and rash.  Neurological: Negative for dizziness, tremors, weakness, numbness and headaches.  Psychiatric/Behavioral: Positive for dysphoric mood and sleep disturbance. Negative for confusion and suicidal ideas.    Objective:  BP 134/82 (BP Location: Right Arm, Patient Position: Sitting, Cuff Size: Large)   Pulse 62   Temp 97.8 F (36.6 C) (Oral)   Ht 5\' 7"  (1.702 m)   Wt 200 lb (90.7 kg)   SpO2 100%   BMI 31.32 kg/m   BP Readings from Last 3 Encounters:  01/23/17 134/82  11/23/16 138/80  08/22/16 (!) 142/78    Wt Readings from Last 3 Encounters:  01/23/17 200 lb (90.7 kg)  11/23/16 209 lb (94.8 kg)  08/22/16 211 lb (95.7 kg)    Physical Exam  Constitutional: She appears well-developed. No distress.  HENT:  Head: Normocephalic.  Right Ear: External ear normal.  Left Ear: External ear normal.  Nose: Nose normal.  Mouth/Throat: Oropharynx is clear and moist.  Eyes:  Conjunctivae are normal. Pupils are equal, round, and reactive to light. Right eye exhibits no discharge. Left eye exhibits no discharge.  Neck: Normal range of motion. Neck supple. No JVD present. No tracheal deviation present. No thyromegaly present.  Cardiovascular: Normal rate, regular rhythm and normal heart sounds.  Pulmonary/Chest: No stridor. No respiratory distress. She has no wheezes.  Abdominal: Soft. Bowel sounds are normal. She exhibits no distension and no mass. There is no tenderness. There is no rebound and no guarding.  Musculoskeletal:  She exhibits tenderness. She exhibits no edema.  Lymphadenopathy:    She has no cervical adenopathy.  Neurological: She displays normal reflexes. No cranial nerve deficit. She exhibits normal muscle tone. Coordination abnormal.  Skin: No rash noted. No erythema.  Psychiatric: Her behavior is normal. Judgment and thought content normal.  obese LS, knees tender Sad  Lab Results  Component Value Date   WBC 6.5 05/16/2016   HGB 12.6 05/16/2016   HCT 38.3 05/16/2016   PLT 218.0 05/16/2016   GLUCOSE 113 (H) 08/22/2016   CHOL 198 07/14/2013   TRIG 82.0 07/14/2013   HDL 46.50 07/14/2013   LDLDIRECT 180.9 07/26/2009   LDLCALC 135 (H) 07/14/2013   ALT 14 08/22/2016   AST 23 08/22/2016   NA 140 08/22/2016   K 4.1 08/22/2016   CL 106 08/22/2016   CREATININE 0.89 08/22/2016   BUN 15 08/22/2016   CO2 27 08/22/2016   TSH 2.38 05/16/2016   HGBA1C 6.3 08/22/2016   MICROALBUR 0.7 08/22/2016    Mr Knee Right W/o Contrast  Result Date: 08/20/2016 CLINICAL DATA:  Severe right knee and lower leg pain after fall approximately 1 week ago. EXAM: MRI OF THE RIGHT KNEE WITHOUT CONTRAST TECHNIQUE: Multiplanar, multisequence MR imaging of the knee was performed. No intravenous contrast was administered. COMPARISON:  Right knee x-rays dated August 11, 2016. FINDINGS: MENISCI Medial meniscus: There is a degenerative, predominantly horizontal tear of the body and posterior horn of the medial meniscus. Lateral meniscus: There is a complete tear near the body/posterior horn junction with extrusion and additional complex tearing of body. LIGAMENTS Cruciates: There is increased intermediate and fluid signal in the ACL at its tibial attachment. The PCL is intact. Collaterals: Medial collateral ligament is intact. Lateral collateral ligament complex is intact. CARTILAGE Patellofemoral: Large areas of full-thickness cartilage loss over the patella and trochlea with prominent underlying subchondral marrow edema and  cystic change. Medial: Multifocal areas of full-thickness cartilage loss with large marginal osteophytes. Lateral: Multifocal areas of full-thickness cartilage loss and delamination with large marginal osteophytes. Joint: Small joint effusion with synovitis. No definite intra-articular loose bodies. No plical thickening. Normal Hoffa's fat. Popliteal Fossa:  No Baker cyst.  Intact popliteus tendon. Extensor Mechanism: Increased intermediate signal within the distal quadriceps tendon and proximal and distal patellar tendon. There is a thin linear focus of fluid intensity signal in the distal patellar tendon (series 6, image 10), which may represent a partial interstitial tear. Bones:  No fracture or dislocation.  No marrow replacing lesion. Other: None. IMPRESSION: 1. Degenerative, predominantly horizontal tear of the body and posterior horn of the medial meniscus. 2. Complete tear of the lateral meniscus body/posterior horn junction with extrusion and additional complex tearing of the body. 3. Increased signal in the ACL at its tibial attachment, which may be related to sprain or partial tearing. 4. Severe tricompartmental degenerative changes. 5. Quadriceps and patellar tendinosis with possible low-grade interstitial tear of the distal patellar tendon. Electronically Signed  By: Titus Dubin M.D.   On: 08/20/2016 13:17    Assessment & Plan:   There are no diagnoses linked to this encounter. I have discontinued Michaelene H. Gosch's acetaminophen-codeine. I am also having her maintain her aspirin, losartan, furosemide, levothyroxine, cholecalciferol, acetaminophen, amLODipine, methocarbamol, carvedilol, traZODone, and potassium chloride. We will continue to administer sodium chloride.  No orders of the defined types were placed in this encounter.    Follow-up: No Follow-up on file.  Walker Kehr, MD

## 2017-01-23 NOTE — Assessment & Plan Note (Signed)
Losartan, amlodipine, Coreg Nl BP at home

## 2017-01-23 NOTE — Assessment & Plan Note (Signed)
Labs  Lost wt 

## 2017-02-06 ENCOUNTER — Ambulatory Visit: Payer: PPO

## 2017-02-06 ENCOUNTER — Ambulatory Visit
Admission: RE | Admit: 2017-02-06 | Discharge: 2017-02-06 | Disposition: A | Discharge status: Home / Self Care | Payer: PPO | Source: Ambulatory Visit | Attending: Internal Medicine | Admitting: Internal Medicine

## 2017-02-06 DIAGNOSIS — Z1231 Encounter for screening mammogram for malignant neoplasm of breast: Secondary | ICD-10-CM | POA: Diagnosis not present

## 2017-02-06 DIAGNOSIS — Z139 Encounter for screening, unspecified: Secondary | ICD-10-CM

## 2017-02-07 ENCOUNTER — Encounter: Payer: PPO | Admitting: Gynecology

## 2017-02-11 ENCOUNTER — Other Ambulatory Visit: Payer: Self-pay | Admitting: Internal Medicine

## 2017-02-13 ENCOUNTER — Ambulatory Visit (INDEPENDENT_AMBULATORY_CARE_PROVIDER_SITE_OTHER): Payer: Self-pay

## 2017-02-13 ENCOUNTER — Encounter (INDEPENDENT_AMBULATORY_CARE_PROVIDER_SITE_OTHER): Payer: Self-pay | Admitting: Physical Medicine and Rehabilitation

## 2017-02-13 ENCOUNTER — Ambulatory Visit (INDEPENDENT_AMBULATORY_CARE_PROVIDER_SITE_OTHER): Payer: PPO | Admitting: Physical Medicine and Rehabilitation

## 2017-02-13 VITALS — BP 156/95 | HR 68 | Temp 98.6°F

## 2017-02-13 DIAGNOSIS — M5416 Radiculopathy, lumbar region: Secondary | ICD-10-CM

## 2017-02-13 MED ORDER — BETAMETHASONE SOD PHOS & ACET 6 (3-3) MG/ML IJ SUSP
12.0000 mg | Freq: Once | INTRAMUSCULAR | Status: AC
  Administered 2017-02-13: 12 mg

## 2017-02-13 NOTE — Patient Instructions (Signed)

## 2017-02-13 NOTE — Progress Notes (Deleted)
Pt states pain in right knee that goes down her right lower leg. Pt states pain has been there since she hurt her knee September 2018. Pt states walking makes the pain worse, she states it feels as if her knee is going to push forward. Pt states pain medication helps ease the pain. +Driver, -BT, -Dye Allergies.

## 2017-02-26 ENCOUNTER — Telehealth: Payer: Self-pay | Admitting: Internal Medicine

## 2017-02-26 NOTE — Telephone Encounter (Signed)
Copied from [place]. Topic: Quick Communication - See Telephone Encounter >> Feb 26, 2017 10:57 AM Robina Ade, Helene Kelp D wrote: CRM for notification. See Telephone encounter for: 02/26/17. Patient called and would like to let Dr. Alain Marion that she is having leg cramps and is drinking plenty of water but she would like to stop taking her medication furosemide (LASIX) 20 MG tablet. Please call patient back, thanks.

## 2017-02-26 NOTE — Telephone Encounter (Signed)
Please advise.   Patient has a FU in May.

## 2017-02-26 NOTE — Telephone Encounter (Signed)
Please advise 

## 2017-02-27 DIAGNOSIS — H3589 Other specified retinal disorders: Secondary | ICD-10-CM | POA: Diagnosis not present

## 2017-02-27 DIAGNOSIS — H04123 Dry eye syndrome of bilateral lacrimal glands: Secondary | ICD-10-CM | POA: Diagnosis not present

## 2017-02-27 DIAGNOSIS — H11153 Pinguecula, bilateral: Secondary | ICD-10-CM | POA: Diagnosis not present

## 2017-02-27 DIAGNOSIS — H40053 Ocular hypertension, bilateral: Secondary | ICD-10-CM | POA: Diagnosis not present

## 2017-02-27 DIAGNOSIS — H40023 Open angle with borderline findings, high risk, bilateral: Secondary | ICD-10-CM | POA: Diagnosis not present

## 2017-02-27 DIAGNOSIS — H40003 Preglaucoma, unspecified, bilateral: Secondary | ICD-10-CM | POA: Diagnosis not present

## 2017-02-27 DIAGNOSIS — Z961 Presence of intraocular lens: Secondary | ICD-10-CM | POA: Diagnosis not present

## 2017-02-27 DIAGNOSIS — H5213 Myopia, bilateral: Secondary | ICD-10-CM | POA: Diagnosis not present

## 2017-02-27 DIAGNOSIS — H353131 Nonexudative age-related macular degeneration, bilateral, early dry stage: Secondary | ICD-10-CM | POA: Diagnosis not present

## 2017-02-27 DIAGNOSIS — H52223 Regular astigmatism, bilateral: Secondary | ICD-10-CM | POA: Diagnosis not present

## 2017-02-27 DIAGNOSIS — H18413 Arcus senilis, bilateral: Secondary | ICD-10-CM | POA: Diagnosis not present

## 2017-02-27 NOTE — Telephone Encounter (Signed)
She can try to take furosemide on as-needed basis for leg swelling.  Thank you

## 2017-02-27 NOTE — Telephone Encounter (Signed)
Advised patient of dr plotnikovs note/instructions, patient repeated back for understanding

## 2017-03-04 NOTE — Procedures (Signed)
Lumbosacral Transforaminal Epidural Steroid Injection - Infraneural Approach with Fluoroscopic Guidance  Patient: Deanna Salazar      Date of Birth: Jul 29, 1940 MRN: [id number] PCP: Cassandria Anger, MD      Visit Date: 02/13/2017   Universal Protocol:     Consent Given By: the patient  Position: PRONE   Additional Comments: Vital signs were monitored before and after the procedure. Patient was prepped and draped in the usual sterile fashion. The correct patient, procedure, and site was verified.   Injection Procedure Details:  Procedure Site One Meds Administered:  Meds ordered this encounter  Medications  . betamethasone acetate-betamethasone sodium phosphate (CELESTONE) injection 12 mg      Laterality: Right  Location/Site:  L5-S1  Needle size: 22 G  Needle type: Spinal  Needle Placement: Transforaminal  Findings:   -Comments: Excellent flow of contrast along the nerve and into the epidural space.  Procedure Details: After squaring off the end-plates of the desired vertebral level to get a true AP view, the C-arm was obliqued to the painful side so that the superior articulating process is positioned about 1/3 the length of the inferior endplate.  The needle was aimed toward the junction of the superior articular process and the transverse process of the inferior vertebrae. The needle's initial entry is in the lower third of the foramen through Kambin's triangle. The soft tissues overlying this target were infiltrated with 2-3 ml. of 1% Lidocaine without Epinephrine.  The spinal needle was then inserted and advanced toward the target using a "trajectory" view along the fluoroscope beam.  Under AP and lateral visualization, the needle was advanced so it did not puncture dura and did not traverse medially beyond the 6 o'clock position of the pedicle. Bi-planar projections were used to confirm position. Aspiration was confirmed to be negative for CSF and/or blood. A  1-2 ml. volume of Isovue-250 was injected and flow of contrast was noted at each level. Radiographs were obtained for documentation purposes.   After attaining the desired flow of contrast documented above, a 0.5 to 1.0 ml test dose of 0.25% Marcaine was injected into each respective transforaminal space.  The patient was observed for 90 seconds post injection.  After no sensory deficits were reported, and normal lower extremity motor function was noted,   the above injectate was administered so that equal amounts of the injectate were placed at each foramen (level) into the transforaminal epidural space.   Additional Comments:  The patient tolerated the procedure well Dressing: Band-Aid    Post-procedure details: Patient was observed during the procedure. Post-procedure instructions were reviewed.  Patient left the clinic in stable condition.

## 2017-03-04 NOTE — Progress Notes (Signed)
Deanna Salazar - 77 y.o. female MRN [id number]  Date of birth: December 04, 1940  Office Visit Note: Visit Date: 02/13/2017 PCP: Cassandria Anger, MD Referred by: Cassandria Anger, MD  Subjective: Chief Complaint  Patient presents with  . Right Knee - Pain  . Right Lower Leg - Pain   HPI: Deanna Salazar is a 77 year old female with right radicular leg pain since September of last year.  She is been followed by Dr. Sharol Given and Dondra Prader.  She has had MRI showing severe facet arthropathy at L5-S1 with bilateral foraminal narrowing and grade 1 listhesis.  She is not gotten much relief with conservative care including medications and time and therapy.  We are going to complete a right L5 transforaminal epidural steroid injection.  Her case is complicated by multiple medical problems as well as diabetes and fibromyalgia.    ROS Otherwise per HPI.  Assessment & Plan: Visit Diagnoses:  1. Lumbar radiculopathy     Plan: No additional findings.   Meds & Orders:  Meds ordered this encounter  Medications  . betamethasone acetate-betamethasone sodium phosphate (CELESTONE) injection 12 mg    Orders Placed This Encounter  Procedures  . XR C-ARM NO REPORT  . Epidural Steroid injection    Follow-up: Return if symptoms worsen or fail to improve.   Procedures: No procedures performed  Lumbosacral Transforaminal Epidural Steroid Injection - Infraneural Approach with Fluoroscopic Guidance  Patient: AROURA Salazar      Date of Birth: Aug 14, 1940 MRN: [id number] PCP: Cassandria Anger, MD      Visit Date: 02/13/2017   Universal Protocol:     Consent Given By: the patient  Position: PRONE   Additional Comments: Vital signs were monitored before and after the procedure. Patient was prepped and draped in the usual sterile fashion. The correct patient, procedure, and site was verified.   Injection Procedure Details:  Procedure Site One Meds Administered:  Meds ordered this  encounter  Medications  . betamethasone acetate-betamethasone sodium phosphate (CELESTONE) injection 12 mg      Laterality: Right  Location/Site:  L5-S1  Needle size: 22 G  Needle type: Spinal  Needle Placement: Transforaminal  Findings:   -Comments: Excellent flow of contrast along the nerve and into the epidural space.  Procedure Details: After squaring off the end-plates of the desired vertebral level to get a true AP view, the C-arm was obliqued to the painful side so that the superior articulating process is positioned about 1/3 the length of the inferior endplate.  The needle was aimed toward the junction of the superior articular process and the transverse process of the inferior vertebrae. The needle's initial entry is in the lower third of the foramen through Kambin's triangle. The soft tissues overlying this target were infiltrated with 2-3 ml. of 1% Lidocaine without Epinephrine.  The spinal needle was then inserted and advanced toward the target using a "trajectory" view along the fluoroscope beam.  Under AP and lateral visualization, the needle was advanced so it did not puncture dura and did not traverse medially beyond the 6 o'clock position of the pedicle. Bi-planar projections were used to confirm position. Aspiration was confirmed to be negative for CSF and/or blood. A 1-2 ml. volume of Isovue-250 was injected and flow of contrast was noted at each level. Radiographs were obtained for documentation purposes.   After attaining the desired flow of contrast documented above, a 0.5 to 1.0 ml test dose of 0.25% Marcaine was injected into each respective  transforaminal space.  The patient was observed for 90 seconds post injection.  After no sensory deficits were reported, and normal lower extremity motor function was noted,   the above injectate was administered so that equal amounts of the injectate were placed at each foramen (level) into the transforaminal epidural  space.   Additional Comments:  The patient tolerated the procedure well Dressing: Band-Aid    Post-procedure details: Patient was observed during the procedure. Post-procedure instructions were reviewed.  Patient left the clinic in stable condition.   Clinical History: MRI LUMBAR SPINE WITHOUT CONTRAST 2014  Technique:  Multiplanar and multiecho pulse sequences of the lumbar spine were obtained without intravenous contrast.  Comparison: Acute abdominal series 04/23/2006.  Abdominal CT 05/09/2004.  Findings: Prior studies demonstrate five lumbar type vertebral bodies.  There is 2 mm of anterolisthesis at L4-L5 and 3 mm of anterolisthesis at L5-S1 due to advanced facet disease.  At both levels, there is subchondral edema adjacent to the facet joints. There is no evidence of acute fracture or pars defect.  The conus medullaris extends to the L1 level and appears normal. There are no paraspinal abnormalities. A left renal cyst is noted.  There is mild disc bulging in the lower thoracic spine without resulting spinal stenosis or nerve root encroachment.  There are no significant disc space findings at L1-L2 or L2-L3.  L3-L4:  Minimal disc bulging and mild bilateral facet hypertrophy. No spinal stenosis or nerve root encroachment.  L4-L5:  There is mild disc bulging with a left annular rent and moderate bilateral facet hypertrophy.  Due to the anterolisthesis, there is a mild triangulation of the thecal sac with mild narrowing of the lateral recesses and foramina bilaterally.  L5-S1:  There is advanced bilateral facet disease with bilateral facet joint effusions and a resulting grade 1 anterolisthesis. There is annular disc bulging contributing to moderate to severe foraminal narrowing bilaterally.  Bilateral L5 nerve root encroachment is likely.  IMPRESSION:  1.  Advanced facet disease at L5-S1 with a resulting anterolisthesis and diffuse disc bulging.  There  is moderate to severe foraminal narrowing bilaterally with probable bilateral L5 nerve root encroachment. 2.  Slightly lesser facet disease and anterolisthesis at L4-L5 with mild resulting central, lateral recess and foraminal stenosis bilaterally. 3.  No other significant disc space findings.  She reports that  has never smoked. she has never used smokeless tobacco.  Recent Labs    08/22/16 0929 01/23/17 0911  HGBA1C 6.3 6.3    Objective:  VS:  HT:    WT:   BMI:     BP:(!) 156/95  HR:68bpm  TEMP:98.6 F (37 C)(Oral)  RESP:98 % Physical Exam  Musculoskeletal:  Patient has good strength bilaterally.    Ortho Exam Imaging: No results found.  Past Medical/Family/Surgical/Social History: Medications & Allergies reviewed per EMR Patient Active Problem List   Diagnosis Date Noted  . Grief 11/23/2016  . Unilateral primary osteoarthritis, right knee 08/23/2016  . Hypokalemia 05/16/2016  . Rhonchi 02/09/2016  . Intertrigo 10/04/2015  . Allergic rhinitis 04/05/2015  . Well adult exam 06/28/2014  . Tinea pedis 10/13/2013  . Near syncope 09/28/2013  . LBP (low back pain) 09/24/2012  . Diabetes type 2, controlled ([place]) 11/06/2011  . Breast cancer ([place]) 11/06/2011  . URI, acute 03/02/2011  . Flank pain 03/02/2011  . Hyperglycemia 03/02/2011  . Edema 10/30/2010  . Dyspnea and respiratory abnormalities 10/30/2010  . Cramp in limb 10/30/2010  . Bronchitis 10/06/2010  . NAUSEA ALONE  03/23/2010  . RENAL CYST, LEFT 02/23/2010  . Chest pain 02/23/2010  . Dyslipidemia 07/26/2009  . ABDOMINAL PAIN-EPIGASTRIC 03/24/2009  . HIP PAIN 07/20/2008  . BACK PAIN 07/20/2008  . FEVER, NOS 07/20/2008  . PARESTHESIA 04/06/2008  . Dysuria 04/06/2008  . Dysphagia, pharyngoesophageal phase 02/05/2008  . ABDOMINAL PAIN, GENERALIZED 02/05/2008  . HYPOKALEMIA 11/25/2007  . Depression 11/25/2007  . COLONIC POLYPS, ADENOMATOUS, HX OF 10/01/2007  . VISION IMPAIRMENT, LOW VISION, ONE EYE-LEFT  07/25/2007  . KELOID 07/25/2007  . MURMUR 07/25/2007  . HEMORRHOIDS, INTERNAL 05/10/2007  . CONSTIPATION, CHRONIC 05/10/2007  . BREAST CANCER, HX OF 04/16/2007  . ACUTE BRONCHITIS 02/15/2007  . Hypothyroidism 12/26/2006  . INSOMNIA, PERSISTENT 12/26/2006  . Essential hypertension 12/26/2006  . Chronic fatigue 12/26/2006  . GERD 11/19/2006  . IBS 11/19/2006  . FATTY LIVER DISEASE 11/19/2006  . Rheumatoid arthritis ([place]) 11/19/2006  . Fibromyalgia 11/19/2006   Past Medical History:  Diagnosis Date  . Abdominal pain, generalized 02/05/2008  . ABDOMINAL PAIN-EPIGASTRIC 03/24/2009  . Acute bronchitis 02/15/2007  . Adenomatous colon polyp 04/2000  . Arthritis   . ASCUS favor benign 10/2011   negative high risk HPV screen  . BACK PAIN 07/20/2008  . Breast cancer ([place]) 2009   l, hx of XRT Dr Benay Spice, Left side  . BREAST CANCER, HX OF 04/16/2007  . CHEST PAIN 02/23/2010  . COLONIC POLYPS, ADENOMATOUS, HX OF 10/01/2007  . CONSTIPATION, CHRONIC 05/10/2007  . Dysuria 04/06/2008  . FATIGUE 12/26/2006  . Fatty liver   . FATTY LIVER DISEASE 11/19/2006  . FIBROMYALGIA 11/19/2006  . GERD 11/19/2006   Dr Fuller Plan  . HEMATOCHEZIA 05/10/2007  . HEMORRHOIDS, INTERNAL 05/10/2007  . HIP PAIN 07/20/2008  . HYPERLIPIDEMIA 07/26/2009  . HYPERTENSION 12/26/2006  . HYPOKALEMIA 11/25/2007  . HYPOTHYROIDISM 12/26/2006  . IBS 11/19/2006  . INSOMNIA, PERSISTENT 12/26/2006  . KELOID 07/25/2007  . MURMUR 07/25/2007  . Nausea alone 03/23/2010  . PARESTHESIA 04/06/2008  . RENAL CYST, LEFT 02/23/2010  . Rheumatoid arthritis(714.0) 11/19/2006   Dr Vianne Bulls  . RUQ PAIN 11/07/2009  . Tubular adenoma   . VISION IMPAIRMENT, LOW VISION, ONE EYE-LEFT 07/25/2007   Family History  Problem Relation Age of Onset  . Lung cancer Brother   . Colon cancer Brother   . Diabetes Father   . Brain cancer Brother   . Stroke Sister    Past Surgical History:  Procedure Laterality Date  . BREAST LUMPECTOMY Left 2009  . BREAST  SURGERY  2009   Left lumpectomy  . Cyst excision from chest    . knee surgery Left   . Left thyroidectomy    . OS Cataract extraction  2009  . OVARIAN CYST REMOVAL  age 49  . SHOULDER SURGERY     both shoulders  . TOE SURGERY    . TUBAL LIGATION     Social History   Occupational History  . Occupation: retired    Fish farm manager: RETIRED  Tobacco Use  . Smoking status: Never Smoker  . Smokeless tobacco: Never Used  Substance and Sexual Activity  . Alcohol use: No    Alcohol/week: 0.0 oz  . Drug use: No  . Sexual activity: No    Comment: 1st intercourse 77 yo-Fewer than 5 partners

## 2017-04-04 ENCOUNTER — Encounter: Payer: Self-pay | Admitting: Internal Medicine

## 2017-04-04 ENCOUNTER — Ambulatory Visit (INDEPENDENT_AMBULATORY_CARE_PROVIDER_SITE_OTHER): Payer: PPO | Admitting: Internal Medicine

## 2017-04-04 DIAGNOSIS — E785 Hyperlipidemia, unspecified: Secondary | ICD-10-CM

## 2017-04-04 DIAGNOSIS — E039 Hypothyroidism, unspecified: Secondary | ICD-10-CM | POA: Diagnosis not present

## 2017-04-04 DIAGNOSIS — M797 Fibromyalgia: Secondary | ICD-10-CM | POA: Diagnosis not present

## 2017-04-04 DIAGNOSIS — F329 Major depressive disorder, single episode, unspecified: Secondary | ICD-10-CM

## 2017-04-04 DIAGNOSIS — E119 Type 2 diabetes mellitus without complications: Secondary | ICD-10-CM | POA: Diagnosis not present

## 2017-04-04 DIAGNOSIS — G4700 Insomnia, unspecified: Secondary | ICD-10-CM

## 2017-04-04 MED ORDER — TELMISARTAN 40 MG PO TABS: 40.0000 mg | ORAL_TABLET | Freq: Every day | ORAL | 11 refills | Status: DC

## 2017-04-04 MED ORDER — ESCITALOPRAM OXALATE 5 MG PO TABS: 5.0000 mg | ORAL_TABLET | Freq: Every day | ORAL | 5 refills | Status: DC

## 2017-04-04 MED ORDER — TEMAZEPAM 15 MG PO CAPS: 15.0000 mg | ORAL_CAPSULE | Freq: Every evening | ORAL | 5 refills | Status: DC | PRN

## 2017-04-04 NOTE — Assessment & Plan Note (Signed)
Labs

## 2017-04-04 NOTE — Assessment & Plan Note (Signed)
Statin intolerant 

## 2017-04-04 NOTE — Progress Notes (Signed)
Subjective:  Patient ID: Deanna Salazar, female    DOB: [birth year]  Age: 77 y.o. MRN: [id number]  CC: No chief complaint on file.   HPI [employer] presents for HTN - pt wants to switch from Losartan C/o insomnia C/o anxiety, tight muscles and racing thoughs. Family thinks she is depressed...  Outpatient Medications Prior to Visit  Medication Sig Dispense Refill  . acetaminophen (TYLENOL) 500 MG tablet Take 500-1,000 mg by mouth every 6 (six) hours as needed for mild pain, moderate pain, fever or headache.    Marland Kitchen amLODipine (NORVASC) 5 MG tablet Take 1 tablet (5 mg total) by mouth daily. 90 tablet 2  . aspirin (ASPIRIN CHILDRENS) 81 MG chewable tablet Chew 1 tablet (81 mg total) by mouth daily. 36 tablet 11  . carvedilol (COREG) 12.5 MG tablet Take 1 tablet (12.5 mg total) 2 (two) times daily with a meal by mouth. 180 tablet 3  . cholecalciferol (VITAMIN D) 1000 units tablet Take 1,000 Units by mouth daily.    . furosemide (LASIX) 20 MG tablet take 1 tablet by mouth once daily 90 tablet 2  . levothyroxine (SYNTHROID, LEVOTHROID) 25 MCG tablet take 1 tablet by mouth once daily BEFORE BREAKFAST 90 tablet 2  . losartan (COZAAR) 100 MG tablet take 1 tablet by mouth once daily 90 tablet 2  . meclizine (ANTIVERT) 25 MG tablet take 1 tablet by mouth three times a day if needed for dizziness or nausea 30 tablet 1  . methocarbamol (ROBAXIN) 500 MG tablet Take 1 tablet (500 mg total) by mouth every 8 (eight) hours as needed for muscle spasms. 30 tablet 0  . potassium chloride (K-DUR) 10 MEQ tablet take 1 tablet by mouth once daily 90 tablet 3  . traZODone (DESYREL) 50 MG tablet Take 2 tablets (100 mg total) by mouth at bedtime as needed for sleep. 60 tablet 5   Facility-Administered Medications Prior to Visit  Medication Dose Route Frequency Provider Last Rate Last Dose  . 0.9 %  sodium chloride infusion  500 mL Intravenous Continuous Ladene Artist, MD        ROS Review of Systems    Constitutional: Negative for activity change, appetite change, chills, fatigue and unexpected weight change.  HENT: Negative for congestion, mouth sores and sinus pressure.   Eyes: Negative for visual disturbance.  Respiratory: Negative for cough and chest tightness.   Gastrointestinal: Negative for abdominal pain and nausea.  Genitourinary: Negative for difficulty urinating, frequency and vaginal pain.  Musculoskeletal: Positive for arthralgias. Negative for back pain and gait problem.  Skin: Negative for pallor and rash.  Neurological: Negative for dizziness, tremors, weakness, numbness and headaches.  Psychiatric/Behavioral: Positive for dysphoric mood and sleep disturbance. Negative for confusion and suicidal ideas. The patient is nervous/anxious.     Objective:  BP 136/84 (BP Location: Right Arm, Patient Position: Sitting, Cuff Size: Large)   Pulse (!) 59   Temp 98.3 F (36.8 C) (Oral)   Ht 5\' 7"  (1.702 m)   Wt 208 lb (94.3 kg)   SpO2 99%   BMI 32.58 kg/m   BP Readings from Last 3 Encounters:  04/04/17 136/84  02/13/17 (!) 156/95  01/23/17 134/82    Wt Readings from Last 3 Encounters:  04/04/17 208 lb (94.3 kg)  01/23/17 200 lb (90.7 kg)  11/23/16 209 lb (94.8 kg)    Physical Exam  Constitutional: She appears well-developed. No distress.  HENT:  Head: Normocephalic.  Right Ear: External ear normal.  Left Ear: External ear normal.  Nose: Nose normal.  Mouth/Throat: Oropharynx is clear and moist.  Eyes: Pupils are equal, round, and reactive to light. Conjunctivae are normal. Right eye exhibits no discharge. Left eye exhibits no discharge.  Neck: Normal range of motion. Neck supple. No JVD present. No tracheal deviation present. No thyromegaly present.  Cardiovascular: Normal rate, regular rhythm and normal heart sounds.  Pulmonary/Chest: No stridor. No respiratory distress. She has no wheezes.  Abdominal: Soft. Bowel sounds are normal. She exhibits no distension  and no mass. There is no tenderness. There is no rebound and no guarding.  Musculoskeletal: She exhibits no edema or tenderness.  Lymphadenopathy:    She has no cervical adenopathy.  Neurological: She displays normal reflexes. No cranial nerve deficit. She exhibits normal muscle tone. Coordination normal.  Skin: No rash noted. No erythema.  Psychiatric: She has a normal mood and affect. Her behavior is normal. Judgment and thought content normal.  obese  Lab Results  Component Value Date   WBC 6.5 05/16/2016   HGB 12.6 05/16/2016   HCT 38.3 05/16/2016   PLT 218.0 05/16/2016   GLUCOSE 123 (H) 01/23/2017   CHOL 198 07/14/2013   TRIG 82.0 07/14/2013   HDL 46.50 07/14/2013   LDLDIRECT 180.9 07/26/2009   LDLCALC 135 (H) 07/14/2013   ALT 14 08/22/2016   AST 23 08/22/2016   NA 138 01/23/2017   K 4.0 01/23/2017   CL 102 01/23/2017   CREATININE 0.92 01/23/2017   BUN 12 01/23/2017   CO2 30 01/23/2017   TSH 2.38 05/16/2016   HGBA1C 6.3 01/23/2017   MICROALBUR 0.7 08/22/2016    Mm Screening Breast Tomo Bilateral  Result Date: 02/06/2017 CLINICAL DATA:  Screening. EXAM: 2D DIGITAL SCREENING BILATERAL MAMMOGRAM WITH 3D TOMO WITH CAD COMPARISON:  Previous exam(s). ACR Breast Density Category b: There are scattered areas of fibroglandular density. FINDINGS: There are no findings suspicious for malignancy. Stable postsurgical changes on the left. Images were processed with CAD. IMPRESSION: No mammographic evidence of malignancy. A result letter of this screening mammogram will be mailed directly to the patient. RECOMMENDATION: Screening mammogram in one year. (Code:SM-B-01Y) BI-RADS CATEGORY  2: Benign. Electronically Signed   By: Lajean Manes M.D.   On: 02/06/2017 11:08    Assessment & Plan:   There are no diagnoses linked to this encounter. I am having Deanna Salazar maintain her aspirin, losartan, furosemide, levothyroxine, cholecalciferol, acetaminophen, amLODipine, methocarbamol,  carvedilol, traZODone, potassium chloride, and meclizine. We will continue to administer sodium chloride.  No orders of the defined types were placed in this encounter.    Follow-up: No follow-ups on file.  Walker Kehr, MD

## 2017-04-04 NOTE — Assessment & Plan Note (Signed)
D/c Trazodone Lexapro  Temazepam prn   Potential benefits of a long term benzodiazepines  use as well as potential risks  and complications were explained to the patient and were aknowledged.

## 2017-04-04 NOTE — Assessment & Plan Note (Addendum)
D/c Trazodone Lexapro

## 2017-04-08 DIAGNOSIS — H35371 Puckering of macula, right eye: Secondary | ICD-10-CM | POA: Diagnosis not present

## 2017-04-08 DIAGNOSIS — H18413 Arcus senilis, bilateral: Secondary | ICD-10-CM | POA: Diagnosis not present

## 2017-04-08 DIAGNOSIS — H11423 Conjunctival edema, bilateral: Secondary | ICD-10-CM | POA: Diagnosis not present

## 2017-04-08 DIAGNOSIS — H5340 Unspecified visual field defects: Secondary | ICD-10-CM | POA: Diagnosis not present

## 2017-04-08 DIAGNOSIS — H40021 Open angle with borderline findings, high risk, right eye: Secondary | ICD-10-CM | POA: Diagnosis not present

## 2017-04-08 DIAGNOSIS — H353131 Nonexudative age-related macular degeneration, bilateral, early dry stage: Secondary | ICD-10-CM | POA: Diagnosis not present

## 2017-04-08 DIAGNOSIS — H21263 Iris atrophy (essential) (progressive), bilateral: Secondary | ICD-10-CM | POA: Diagnosis not present

## 2017-04-08 DIAGNOSIS — H18891 Other specified disorders of cornea, right eye: Secondary | ICD-10-CM | POA: Diagnosis not present

## 2017-04-08 DIAGNOSIS — H401124 Primary open-angle glaucoma, left eye, indeterminate stage: Secondary | ICD-10-CM | POA: Diagnosis not present

## 2017-04-08 DIAGNOSIS — H40003 Preglaucoma, unspecified, bilateral: Secondary | ICD-10-CM | POA: Diagnosis not present

## 2017-04-08 DIAGNOSIS — H04123 Dry eye syndrome of bilateral lacrimal glands: Secondary | ICD-10-CM | POA: Diagnosis not present

## 2017-04-08 DIAGNOSIS — H11153 Pinguecula, bilateral: Secondary | ICD-10-CM | POA: Diagnosis not present

## 2017-04-08 LAB — HM DIABETES EYE EXAM

## 2017-05-04 ENCOUNTER — Other Ambulatory Visit: Payer: Self-pay | Admitting: Internal Medicine

## 2017-05-10 DIAGNOSIS — Z961 Presence of intraocular lens: Secondary | ICD-10-CM | POA: Diagnosis not present

## 2017-05-10 DIAGNOSIS — H40011 Open angle with borderline findings, low risk, right eye: Secondary | ICD-10-CM | POA: Diagnosis not present

## 2017-05-10 DIAGNOSIS — H401122 Primary open-angle glaucoma, left eye, moderate stage: Secondary | ICD-10-CM | POA: Diagnosis not present

## 2017-05-14 ENCOUNTER — Telehealth: Payer: Self-pay | Admitting: Internal Medicine

## 2017-05-14 NOTE — Telephone Encounter (Signed)
Copied from [facility] [id number]. Topic: Quick Communication - See Telephone Encounter >> May 14, 2017 11:37 AM Percell Belt A wrote: CRM for notification. See Telephone encounter for: 05/14/17. Pt called in stated that the Medication temazepam (RESTORIL) 15 MG capsule [7753]  is not helping her sleep.  She would like to know if MG needs to be increased??   Best number-[phone]

## 2017-05-15 NOTE — Telephone Encounter (Signed)
Ok to try to take 30 mg (max dose) Thx

## 2017-05-15 NOTE — Telephone Encounter (Signed)
Called pt no answer LMOM w/MD response../lmb 

## 2017-05-17 DIAGNOSIS — H401122 Primary open-angle glaucoma, left eye, moderate stage: Secondary | ICD-10-CM | POA: Diagnosis not present

## 2017-05-23 ENCOUNTER — Ambulatory Visit: Payer: PPO | Admitting: Internal Medicine

## 2017-05-24 DIAGNOSIS — H401122 Primary open-angle glaucoma, left eye, moderate stage: Secondary | ICD-10-CM | POA: Diagnosis not present

## 2017-05-29 ENCOUNTER — Encounter: Payer: Self-pay | Admitting: Gastroenterology

## 2017-06-04 MED ORDER — TEMAZEPAM 30 MG PO CAPS: 30.0000 mg | ORAL_CAPSULE | Freq: Every evening | ORAL | 2 refills | Status: DC | PRN

## 2017-06-04 NOTE — Addendum Note (Signed)
Addended by: Earnstine Regal on: 06/04/2017 11:21 AM   Modules accepted: Orders

## 2017-06-04 NOTE — Telephone Encounter (Signed)
Pt called stating she is taking 30mg  temazepam now and needing new RX sent to the pharmacy for her. She has 1 left of the 15mg .     [facility] [phone] - [place], [address] AT [address] [phone] (Phone) [phone] (Fax)

## 2017-06-04 NOTE — Telephone Encounter (Addendum)
MD increase med to 30 msg on 05/15/17. Updated med list and called refill [facility] spoke w/pharmacist Nana.Marland KitchenJohny Chess

## 2017-07-05 ENCOUNTER — Other Ambulatory Visit (INDEPENDENT_AMBULATORY_CARE_PROVIDER_SITE_OTHER): Payer: PPO

## 2017-07-05 ENCOUNTER — Ambulatory Visit (INDEPENDENT_AMBULATORY_CARE_PROVIDER_SITE_OTHER): Payer: PPO | Admitting: Internal Medicine

## 2017-07-05 ENCOUNTER — Encounter: Payer: Self-pay | Admitting: Internal Medicine

## 2017-07-05 DIAGNOSIS — E039 Hypothyroidism, unspecified: Secondary | ICD-10-CM | POA: Diagnosis not present

## 2017-07-05 DIAGNOSIS — E119 Type 2 diabetes mellitus without complications: Secondary | ICD-10-CM

## 2017-07-05 DIAGNOSIS — E785 Hyperlipidemia, unspecified: Secondary | ICD-10-CM | POA: Diagnosis not present

## 2017-07-05 DIAGNOSIS — I10 Essential (primary) hypertension: Secondary | ICD-10-CM

## 2017-07-05 LAB — HEPATIC FUNCTION PANEL
ALK PHOS: 74 U/L (ref 39–117)
ALT: 14 U/L (ref 0–35)
AST: 22 U/L (ref 0–37)
Albumin: 4 g/dL (ref 3.5–5.2)
BILIRUBIN TOTAL: 0.4 mg/dL (ref 0.2–1.2)
Bilirubin, Direct: 0.1 mg/dL (ref 0.0–0.3)
Total Protein: 6.8 g/dL (ref 6.0–8.3)

## 2017-07-05 LAB — URINALYSIS, ROUTINE W REFLEX MICROSCOPIC
BILIRUBIN URINE: NEGATIVE
Ketones, ur: NEGATIVE
LEUKOCYTES UA: NEGATIVE
NITRITE: NEGATIVE
Specific Gravity, Urine: <=1.005 — AB (ref 1.000–1.030)
TOTAL PROTEIN, URINE-UPE24: NEGATIVE
Urine Glucose: NEGATIVE
Urobilinogen, UA: 0.2 (ref 0.0–1.0)
WBC UA: NONE SEEN (ref 0–?)
pH: 6 (ref 5.0–8.0)

## 2017-07-05 LAB — BASIC METABOLIC PANEL
BUN: 13 mg/dL (ref 6–23)
CHLORIDE: 106 meq/L (ref 96–112)
CO2: 27 meq/L (ref 19–32)
Calcium: 9.8 mg/dL (ref 8.4–10.5)
Creatinine, Ser: 0.94 mg/dL (ref 0.40–1.20)
GFR: 74.09 mL/min (ref >60.00)
Glucose, Bld: 125 mg/dL — ABNORMAL HIGH (ref 70–99)
POTASSIUM: 4.3 meq/L (ref 3.5–5.1)
SODIUM: 140 meq/L (ref 135–145)

## 2017-07-05 LAB — HEMOGLOBIN A1C: Hgb A1c MFr Bld: 6.4 % (ref 4.6–6.5)

## 2017-07-05 LAB — LIPID PANEL
CHOL/HDL RATIO: 4
Cholesterol: 198 mg/dL (ref 0–200)
HDL: 47.6 mg/dL (ref >39.00)
LDL Cholesterol: 127 mg/dL — ABNORMAL HIGH (ref 0–99)
NONHDL: 150.82
Triglycerides: 118 mg/dL (ref 0.0–149.0)
VLDL: 23.6 mg/dL (ref 0.0–40.0)

## 2017-07-05 LAB — TSH: TSH: 1.44 u[IU]/mL (ref 0.35–4.50)

## 2017-07-05 MED ORDER — FUROSEMIDE 20 MG PO TABS: 20.0000 mg | ORAL_TABLET | Freq: Every day | ORAL | 3 refills | Status: DC

## 2017-07-05 MED ORDER — TEMAZEPAM 30 MG PO CAPS: 30.0000 mg | ORAL_CAPSULE | Freq: Every evening | ORAL | 1 refills | Status: DC | PRN

## 2017-07-05 MED ORDER — LORATADINE 10 MG PO TABS: 10.0000 mg | ORAL_TABLET | Freq: Every day | ORAL | 3 refills | Status: DC

## 2017-07-05 MED ORDER — TELMISARTAN 40 MG PO TABS: 40.0000 mg | ORAL_TABLET | Freq: Every day | ORAL | 3 refills | Status: DC

## 2017-07-05 MED ORDER — LEVOTHYROXINE SODIUM 25 MCG PO TABS: ORAL_TABLET | ORAL | 3 refills | Status: DC

## 2017-07-05 NOTE — Patient Instructions (Signed)
MC well w/Jill 

## 2017-07-05 NOTE — Assessment & Plan Note (Signed)
  On diet  

## 2017-07-05 NOTE — Progress Notes (Signed)
Subjective:  Patient ID: Deanna Salazar, female    DOB: 12-16-1940  Age: 77 y.o. MRN: [id number]  CC: No chief complaint on file.   HPI [employer] presents for HTN, hypothyroidism, insomnia f/u  Outpatient Medications Prior to Visit  Medication Sig Dispense Refill  . acetaminophen (TYLENOL) 500 MG tablet Take 500-1,000 mg by mouth every 6 (six) hours as needed for mild pain, moderate pain, fever or headache.    Marland Kitchen amLODipine (NORVASC) 5 MG tablet Take 1 tablet (5 mg total) by mouth daily. 90 tablet 2  . aspirin (ASPIRIN CHILDRENS) 81 MG chewable tablet Chew 1 tablet (81 mg total) by mouth daily. 36 tablet 11  . carvedilol (COREG) 12.5 MG tablet Take 1 tablet (12.5 mg total) 2 (two) times daily with a meal by mouth. 180 tablet 3  . cholecalciferol (VITAMIN D) 1000 units tablet Take 1,000 Units by mouth daily.    Marland Kitchen escitalopram (LEXAPRO) 5 MG tablet Take 1 tablet (5 mg total) by mouth daily. 30 tablet 5  . furosemide (LASIX) 20 MG tablet take 1 tablet by mouth once daily 90 tablet 2  . levothyroxine (SYNTHROID, LEVOTHROID) 25 MCG tablet TAKE 1 TABLET BY MOUTH ONCE DAILY BEFORE BREAKFAST 90 tablet 0  . meclizine (ANTIVERT) 25 MG tablet take 1 tablet by mouth three times a day if needed for dizziness or nausea 30 tablet 1  . Multiple Vitamins-Minerals (PRESERVISION AREDS 2+MULTI VIT PO) Take by mouth.    Vladimir Faster Glycol-Propyl Glycol (SYSTANE OP) Apply to eye.    . Polyethylene Glycol 3350 (MIRALAX PO) Take by mouth.    . potassium chloride (K-DUR) 10 MEQ tablet take 1 tablet by mouth once daily 90 tablet 3  . telmisartan (MICARDIS) 40 MG tablet Take 1 tablet (40 mg total) by mouth daily. 30 tablet 11  . temazepam (RESTORIL) 30 MG capsule Take 1 capsule (30 mg total) by mouth at bedtime as needed for sleep. 30 capsule 2   Facility-Administered Medications Prior to Visit  Medication Dose Route Frequency Provider Last Rate Last Dose  . 0.9 %  sodium chloride infusion  500 mL  Intravenous Continuous Ladene Artist, MD        ROS: Review of Systems  Constitutional: Negative for activity change, appetite change, chills, fatigue and unexpected weight change.  HENT: Negative for congestion, mouth sores and sinus pressure.   Eyes: Negative for visual disturbance.  Respiratory: Negative for cough and chest tightness.   Gastrointestinal: Negative for abdominal pain and nausea.  Genitourinary: Negative for difficulty urinating, frequency and vaginal pain.  Musculoskeletal: Negative for back pain and gait problem.  Skin: Negative for pallor and rash.  Neurological: Negative for dizziness, tremors, weakness, numbness and headaches.  Psychiatric/Behavioral: Positive for dysphoric mood. Negative for confusion, sleep disturbance and suicidal ideas.    Objective:  BP 132/84 (BP Location: Right Arm, Patient Position: Sitting, Cuff Size: Large)   Pulse 62   Temp 98.2 F (36.8 C) (Oral)   Ht 5\' 7"  (1.702 m)   Wt 207 lb (93.9 kg)   SpO2 99%   BMI 32.42 kg/m   BP Readings from Last 3 Encounters:  07/05/17 132/84  04/04/17 136/84  02/13/17 (!) 156/95    Wt Readings from Last 3 Encounters:  07/05/17 207 lb (93.9 kg)  04/04/17 208 lb (94.3 kg)  01/23/17 200 lb (90.7 kg)    Physical Exam  Constitutional: She appears well-developed. No distress.  HENT:  Head: Normocephalic.  Right Ear:  External ear normal.  Left Ear: External ear normal.  Nose: Nose normal.  Mouth/Throat: Oropharynx is clear and moist.  Eyes: Pupils are equal, round, and reactive to light. Conjunctivae are normal. Right eye exhibits no discharge. Left eye exhibits no discharge.  Neck: Normal range of motion. Neck supple. No JVD present. No tracheal deviation present. No thyromegaly present.  Cardiovascular: Normal rate, regular rhythm and normal heart sounds.  Pulmonary/Chest: No stridor. No respiratory distress. She has no wheezes.  Abdominal: Soft. Bowel sounds are normal. She exhibits no  distension and no mass. There is no tenderness. There is no rebound and no guarding.  Musculoskeletal: She exhibits no edema or tenderness.  Lymphadenopathy:    She has no cervical adenopathy.  Neurological: She displays normal reflexes. No cranial nerve deficit. She exhibits normal muscle tone. Coordination normal.  Skin: No rash noted. No erythema.  Psychiatric: She has a normal mood and affect. Her behavior is normal. Judgment and thought content normal.    Lab Results  Component Value Date   WBC 6.5 05/16/2016   HGB 12.6 05/16/2016   HCT 38.3 05/16/2016   PLT 218.0 05/16/2016   GLUCOSE 123 (H) 01/23/2017   CHOL 198 07/14/2013   TRIG 82.0 07/14/2013   HDL 46.50 07/14/2013   LDLDIRECT 180.9 07/26/2009   LDLCALC 135 (H) 07/14/2013   ALT 14 08/22/2016   AST 23 08/22/2016   NA 138 01/23/2017   K 4.0 01/23/2017   CL 102 01/23/2017   CREATININE 0.92 01/23/2017   BUN 12 01/23/2017   CO2 30 01/23/2017   TSH 2.38 05/16/2016   HGBA1C 6.3 01/23/2017   MICROALBUR 0.7 08/22/2016    Mm Screening Breast Tomo Bilateral  Result Date: 02/06/2017 CLINICAL DATA:  Screening. EXAM: 2D DIGITAL SCREENING BILATERAL MAMMOGRAM WITH 3D TOMO WITH CAD COMPARISON:  Previous exam(s). ACR Breast Density Category b: There are scattered areas of fibroglandular density. FINDINGS: There are no findings suspicious for malignancy. Stable postsurgical changes on the left. Images were processed with CAD. IMPRESSION: No mammographic evidence of malignancy. A result letter of this screening mammogram will be mailed directly to the patient. RECOMMENDATION: Screening mammogram in one year. (Code:SM-B-01Y) BI-RADS CATEGORY  2: Benign. Electronically Signed   By: Lajean Manes M.D.   On: 02/06/2017 11:08    Assessment & Plan:   There are no diagnoses linked to this encounter.   No orders of the defined types were placed in this encounter.    Follow-up: No follow-ups on file.  Walker Kehr, MD

## 2017-07-05 NOTE — Assessment & Plan Note (Signed)
Labs

## 2017-07-05 NOTE — Assessment & Plan Note (Signed)
Losartan, amlodipine, Coreg

## 2017-07-05 NOTE — Assessment & Plan Note (Signed)
Levothroid 

## 2017-07-08 DIAGNOSIS — H40011 Open angle with borderline findings, low risk, right eye: Secondary | ICD-10-CM | POA: Diagnosis not present

## 2017-07-08 DIAGNOSIS — Z961 Presence of intraocular lens: Secondary | ICD-10-CM | POA: Diagnosis not present

## 2017-07-08 DIAGNOSIS — H409 Unspecified glaucoma: Secondary | ICD-10-CM

## 2017-07-08 DIAGNOSIS — H401122 Primary open-angle glaucoma, left eye, moderate stage: Secondary | ICD-10-CM | POA: Diagnosis not present

## 2017-07-08 HISTORY — DX: Unspecified glaucoma: H40.9

## 2017-08-07 ENCOUNTER — Encounter

## 2017-08-07 ENCOUNTER — Encounter: Payer: Self-pay | Admitting: Gastroenterology

## 2017-08-07 ENCOUNTER — Ambulatory Visit (INDEPENDENT_AMBULATORY_CARE_PROVIDER_SITE_OTHER): Payer: PPO | Admitting: Gastroenterology

## 2017-08-07 ENCOUNTER — Encounter (INDEPENDENT_AMBULATORY_CARE_PROVIDER_SITE_OTHER): Payer: Self-pay

## 2017-08-07 VITALS — BP 132/78 | HR 62 | Ht 63.0 in | Wt 204.0 lb

## 2017-08-07 DIAGNOSIS — K219 Gastro-esophageal reflux disease without esophagitis: Secondary | ICD-10-CM | POA: Diagnosis not present

## 2017-08-07 DIAGNOSIS — Z86010 Personal history of colonic polyps: Secondary | ICD-10-CM

## 2017-08-07 DIAGNOSIS — K581 Irritable bowel syndrome with constipation: Secondary | ICD-10-CM

## 2017-08-07 MED ORDER — LINACLOTIDE 290 MCG PO CAPS: 290.0000 ug | ORAL_CAPSULE | Freq: Every day | ORAL | 0 refills | Status: DC

## 2017-08-07 MED ORDER — PANTOPRAZOLE SODIUM 40 MG PO TBEC: 40.0000 mg | DELAYED_RELEASE_TABLET | Freq: Two times a day (BID) | ORAL | 11 refills | Status: DC

## 2017-08-07 MED ORDER — LINACLOTIDE 290 MCG PO CAPS: 290.0000 ug | ORAL_CAPSULE | Freq: Every day | ORAL | 11 refills | Status: DC

## 2017-08-07 NOTE — Patient Instructions (Signed)
Start the samples of Linzess 290 mcg one tablet by mouth once daily.   We have sent the following medications to your pharmacy for you to pick up at your convenience: Linzess and pantoprazole.   We will contact you to schedule your Colonoscopy when the schedule comes out.   Thank you for choosing me and [facility].  Pricilla Riffle. Dagoberto Ligas., MD., Marval Regal

## 2017-08-07 NOTE — Progress Notes (Signed)
    History of Present Illness: This is a 77 year old female with chronic constipation, lower abdominal pain, epigastric pain and reflux symptoms.  She has been off her PPI with worsening symptoms for several weeks.  She states her current regimen of MiraLAX and Dulcolax tablets has not been effective for controlling her constipation she does not feel she has complete evacuation.  She notes lower abdominal pain when her constipation worsens.  She is due for surveillance colonoscopy.  Current Medications, Allergies, Past Medical History, Past Surgical History, Family History and Social History were reviewed in [employer] record.  Physical Exam: General: Well developed, well nourished, no acute distress Head: Normocephalic and atraumatic Eyes:  sclerae anicteric, EOMI Ears: Normal auditory acuity Mouth: No deformity or lesions Lungs: Clear throughout to auscultation Heart: Regular rate and rhythm; no murmurs, rubs or bruits Abdomen: Soft, mild epigastric tenderness and non distended. No masses, hepatosplenomegaly or hernias noted. Normal Bowel sounds Rectal: Deferred to colonoscopy Musculoskeletal: Symmetrical with no gross deformities  Pulses:  Normal pulses noted Extremities: No clubbing, cyanosis, edema or deformities noted Neurological: Alert oriented x 4, grossly nonfocal Psychological:  Alert and cooperative. Normal mood and affect  Assessment and Recommendations:  1. IBS-C. Begin Linzess 290 mcg daily.  Adequate daily water intake was stressed.  May use MiraLAX twice daily as if necessary  2. GERD. Resume pantoprazole 40 mg twice daily.  Follow standard antireflux measures.  3. Personal history of adenomatous colon polyps.  She is due for 5-year surveillance colonoscopy.  Schedule colonoscopy. The risks (including bleeding, perforation, infection, missed lesions, medication reactions and possible hospitalization or surgery if complications occur), benefits,  and alternatives to colonoscopy with possible biopsy and possible polypectomy were discussed with the patient and they consent to proceed.

## 2017-08-14 ENCOUNTER — Telehealth: Payer: Self-pay | Admitting: Gastroenterology

## 2017-08-14 NOTE — Telephone Encounter (Signed)
Patient advised that per the last office note she needs to add Miralax BID if linzess is not helping.  She is advised to try this and call back if this fails to help her symptoms.

## 2017-08-15 ENCOUNTER — Other Ambulatory Visit: Payer: Self-pay | Admitting: Internal Medicine

## 2017-08-19 DIAGNOSIS — H20012 Primary iridocyclitis, left eye: Secondary | ICD-10-CM | POA: Diagnosis not present

## 2017-08-19 DIAGNOSIS — Z961 Presence of intraocular lens: Secondary | ICD-10-CM | POA: Diagnosis not present

## 2017-08-19 DIAGNOSIS — H40011 Open angle with borderline findings, low risk, right eye: Secondary | ICD-10-CM | POA: Diagnosis not present

## 2017-08-19 DIAGNOSIS — H401122 Primary open-angle glaucoma, left eye, moderate stage: Secondary | ICD-10-CM | POA: Diagnosis not present

## 2017-08-23 ENCOUNTER — Ambulatory Visit (INDEPENDENT_AMBULATORY_CARE_PROVIDER_SITE_OTHER): Payer: PPO | Admitting: *Deleted

## 2017-08-23 VITALS — BP 126/62 | HR 59 | Resp 18 | Ht 67.0 in | Wt 202.0 lb

## 2017-08-23 DIAGNOSIS — E119 Type 2 diabetes mellitus without complications: Secondary | ICD-10-CM | POA: Diagnosis not present

## 2017-08-23 DIAGNOSIS — Z0000 Encounter for general adult medical examination without abnormal findings: Secondary | ICD-10-CM

## 2017-08-23 NOTE — Progress Notes (Addendum)
Subjective:   Deanna Salazar is a 77 y.o. female who presents for Medicare Annual (Subsequent) preventive examination.  Review of Systems:  No ROS.  Medicare Wellness Visit. Additional risk factors are reflected in the social history.  Cardiac Risk Factors include: advanced age (>12men, >47 women);diabetes mellitus;dyslipidemia;hypertension;obesity (BMI >30kg/m2) Sleep patterns: gets up 1-2 times nightly to void and sleeps 7 hours nightly.    Home Safety/Smoke Alarms: Feels safe in home. Smoke alarms in place.  Living environment; residence and Firearm Safety: 1-story house/ trailer, no firearms. Lives with daughter, no needs for DME, good support system Seat Belt Safety/Bike Helmet: Wears seat belt.      Objective:     Vitals: BP 126/62   Pulse (!) 59   Resp 18   Ht 5\' 7"  (1.702 m)   Wt 202 lb (91.6 kg)   SpO2 98%   BMI 31.64 kg/m   Body mass index is 31.64 kg/m.  Advanced Directives 08/23/2017 08/22/2016 08/11/2016 02/28/2016 09/29/2013 09/28/2013  Does Patient Have a Medical Advance Directive? No No No No No No  Would patient like information on creating a medical advance directive? Yes (ED - Information included in AVS) Yes (ED - Information included in AVS) No - Patient declined - No - patient declined information No - patient declined information    Tobacco Social History   Tobacco Use  Smoking Status Never Smoker  Smokeless Tobacco Never Used     Counseling given: Not Answered  Past Medical History:  Diagnosis Date  . Abdominal pain, generalized 02/05/2008  . ABDOMINAL PAIN-EPIGASTRIC 03/24/2009  . Acute bronchitis 02/15/2007  . Adenomatous colon polyp 04/2000  . Arthritis   . ASCUS favor benign 10/2011   negative high risk HPV screen  . BACK PAIN 07/20/2008  . Breast cancer ([place]) 2009   l, hx of XRT Dr Benay Spice, Left side  . BREAST CANCER, HX OF 04/16/2007  . CHEST PAIN 02/23/2010  . COLONIC POLYPS, ADENOMATOUS, HX OF 10/01/2007  . CONSTIPATION, CHRONIC 05/10/2007    . Dysuria 04/06/2008  . FATIGUE 12/26/2006  . Fatty liver   . FATTY LIVER DISEASE 11/19/2006  . FIBROMYALGIA 11/19/2006  . GERD 11/19/2006   Dr Fuller Plan  . Glaucoma 07/2017   left eye  . HEMATOCHEZIA 05/10/2007  . HEMORRHOIDS, INTERNAL 05/10/2007  . HIP PAIN 07/20/2008  . HYPERLIPIDEMIA 07/26/2009  . HYPERTENSION 12/26/2006  . HYPOKALEMIA 11/25/2007  . HYPOTHYROIDISM 12/26/2006  . IBS 11/19/2006  . INSOMNIA, PERSISTENT 12/26/2006  . KELOID 07/25/2007  . MURMUR 07/25/2007  . Nausea alone 03/23/2010  . PARESTHESIA 04/06/2008  . RENAL CYST, LEFT 02/23/2010  . Rheumatoid arthritis(714.0) 11/19/2006   Dr Vianne Bulls  . RUQ PAIN 11/07/2009  . Tubular adenoma   . 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 BREAST LUMPECTOMY Left 2009  . BREAST SURGERY  2009   Left lumpectomy  . Cyst excision from chest    . knee surgery Left   . Left thyroidectomy    . OS Cataract extraction  2009  . OVARIAN CYST REMOVAL  age 26  . SHOULDER SURGERY     both shoulders  . TOE SURGERY    . TUBAL LIGATION     Family History  Problem Relation Age of Onset  . Lung cancer Brother   . Colon cancer Brother   . Diabetes Father   . Brain cancer Brother   . Stroke Sister    Social  History   Socioeconomic History  . Marital status: Widowed    Spouse name: Not on file  . Number of children: 5  . Years of education: Not on file  . Highest education level: Not on file  Occupational History  . Occupation: retired    Fish farm manager: RETIRED  Social Needs  . Financial resource strain: Somewhat hard  . Food insecurity:    Worry: Never true    Inability: Never true  . Transportation needs:    Medical: No    Non-medical: No  Tobacco Use  . Smoking status: Never Smoker  . Smokeless tobacco: Never Used  Substance and Sexual Activity  . Alcohol use: No    Alcohol/week: 0.0 standard drinks  . Drug use: No  . Sexual activity: Never    Comment:  1st intercourse 77 yo-Fewer than 5 partners  Lifestyle  . Physical activity:    Days per week: 4 days    Minutes per session: 60 min  . Stress: Not at all  Relationships  . Social connections:    Talks on phone: More than three times a week    Gets together: More than three times a week    Attends religious service: Never    Active member of club or organization: Yes    Attends meetings of clubs or organizations: More than 4 times per year    Relationship status: Widowed  Other Topics Concern  . Not on file  Social History Narrative   Patient does not get regular exercise    Outpatient Encounter Medications as of 08/23/2017  Medication Sig  . acetaminophen (TYLENOL) 500 MG tablet Take 500-1,000 mg by mouth every 6 (six) hours as needed for mild pain, moderate pain, fever or headache.  Marland Kitchen amLODipine (NORVASC) 5 MG tablet Take 1 tablet (5 mg total) by mouth daily.  Marland Kitchen aspirin (ASPIRIN CHILDRENS) 81 MG chewable tablet Chew 1 tablet (81 mg total) by mouth daily.  . carvedilol (COREG) 12.5 MG tablet Take 1 tablet (12.5 mg total) 2 (two) times daily with a meal by mouth.  . cholecalciferol (VITAMIN D) 1000 units tablet Take 1,000 Units by mouth daily.  . furosemide (LASIX) 20 MG tablet Take 1 tablet (20 mg total) by mouth daily.  Marland Kitchen levothyroxine (SYNTHROID, LEVOTHROID) 25 MCG tablet TAKE 1 TABLET BY MOUTH ONCE DAILY BEFORE BREAKFAST  . linaclotide (LINZESS) 290 MCG CAPS capsule Take 1 capsule (290 mcg total) by mouth daily before breakfast.  . loratadine (CLARITIN) 10 MG tablet Take 1 tablet (10 mg total) by mouth daily. (Patient taking differently: Take 10 mg by mouth daily as needed. )  . meclizine (ANTIVERT) 25 MG tablet take 1 tablet by mouth three times a day if needed for dizziness or nausea  . Multiple Vitamins-Minerals (PRESERVISION AREDS 2+MULTI VIT PO) Take by mouth.  . pantoprazole (PROTONIX) 40 MG tablet Take 1 tablet (40 mg total) by mouth 2 (two) times daily.  Vladimir Faster  Glycol-Propyl Glycol (SYSTANE OP) Apply to eye.  . Polyethylene Glycol 3350 (MIRALAX PO) Take by mouth.  . potassium chloride (K-DUR) 10 MEQ tablet take 1 tablet by mouth once daily  . telmisartan (MICARDIS) 40 MG tablet Take 1 tablet (40 mg total) by mouth daily.  . temazepam (RESTORIL) 30 MG capsule Take 1 capsule (30 mg total) by mouth at bedtime as needed for sleep.  Marland Kitchen escitalopram (LEXAPRO) 5 MG tablet Take 1 tablet (5 mg total) by mouth daily. (Patient not taking: Reported on 08/23/2017)  . [  DISCONTINUED] levothyroxine (SYNTHROID, LEVOTHROID) 25 MCG tablet TAKE 1 TABLET BY MOUTH ONCE DAILY BEFORE BREAKFAST (Patient not taking: Reported on 08/23/2017)   No facility-administered encounter medications on file as of 08/23/2017.     Activities of Daily Living In your present state of health, do you have any difficulty performing the following activities: 08/23/2017  Hearing? N  Vision? N  Difficulty concentrating or making decisions? N  Walking or climbing stairs? N  Dressing or bathing? N  Doing errands, shopping? N  Preparing Food and eating ? N  Using the Toilet? N  In the past six months, have you accidently leaked urine? N  Do you have problems with loss of bowel control? N  Managing your Medications? N  Managing your Finances? N  Housekeeping or managing your Housekeeping? N  Some recent data might be hidden    Patient Care Team: Plotnikov, Evie Lacks, MD as PCP - General Hennie Duos, MD (Rheumatology) Newt Minion, MD (Orthopedic Surgery)    Assessment:   This is a routine wellness examination for [place]. Physical assessment deferred to PCP.   Exercise Activities and Dietary recommendations Current Exercise Habits: Structured exercise class(chair exercise print-out provided), Type of exercise: walking;calisthenics(water aerobics), Time (Minutes): 50, Frequency (Times/Week): 3, Weekly Exercise (Minutes/Week): 150, Intensity: Mild, Exercise limited by: orthopedic  condition(s)  Diet (meal preparation, eat out, water intake, caffeinated beverages, dairy products, fruits and vegetables): in general, a "healthy" diet  , well balanced   Reviewed heart healthy and diabetic diet. Encouraged patient to increase daily water and healthy fluid intake. Relevant patient education assigned to patient using Emmi.  Goals    . lose weight     Continue to exercise several times weekly, read food labels to decrease carbohydrates and sugar, monitor my portions.  Enjoy life, family and church.    . Patient Stated     Stay as healthy and as independent as possible by eating healthy, stay physically and socially active, love family, God and enjoy life.     . Weight < 200 lb (90.719 kg)     Will continue the journey;  Will start to monitor sweets;  And keep exercising Low weight with some toning of muscles as tolerated       Fall Risk Fall Risk  08/23/2017 08/22/2016 02/09/2016 11/17/2014 04/05/2014  Falls in the past year? No No No Yes Yes  Number falls in past yr: - - - 1 1  Injury with Fall? - - - No Yes  Risk for fall due to : Impaired balance/gait;Impaired mobility - - - -  Follow up - - - Education provided -     Depression Screen PHQ 2/9 Scores 08/23/2017 08/22/2016 02/09/2016 01/09/2015  PHQ - 2 Score 0 1 0 0  PHQ- 9 Score - 6 - -     Cognitive Function MMSE - Mini Mental State Exam 08/23/2017 08/22/2016  Orientation to time 5 5  Orientation to Place 5 5  Registration 3 3  Attention/ Calculation 5 4  Recall 3 2  Language- name 2 objects 2 2  Language- repeat 1 1  Language- follow 3 step command 3 3  Language- read & follow direction 1 1  Write a sentence 1 1  Copy design 1 1  Total score 30 28        Immunization History  Administered Date(s) Administered  . Influenza Split 10/30/2010, 01/16/2012  . Influenza Whole 03/03/2009  . Influenza, High Dose Seasonal PF 12/06/2014, 10/04/2015, 11/23/2016  .  Influenza,inj,Quad PF,6+ Mos 09/24/2012,  01/04/2014  . Pneumococcal Conjugate-13 04/05/2014  . Pneumococcal Polysaccharide-23 12/06/2014  . Td 04/05/2014   Screening Tests Health Maintenance  Topic Date Due  . OPHTHALMOLOGY EXAM  08/24/1950  . FOOT EXAM  12/06/2015  . COLONOSCOPY  06/12/2017  . INFLUENZA VACCINE  08/08/2017  . TETANUS/TDAP  04/04/2024  . DEXA SCAN  Completed  . PNA vac Low Risk Adult  Completed      Plan:     Continue doing brain stimulating activities (puzzles, reading, adult coloring books, staying active) to keep memory sharp.   Continue to eat heart healthy diet (full of fruits, vegetables, whole grains, lean protein, water--limit salt, fat, and sugar intake) and increase physical activity as tolerated.  I have personally reviewed and noted the following in the patient's chart:   . Medical and social history . Use of alcohol, tobacco or illicit drugs  . Current medications and supplements . Functional ability and status . Nutritional status . Physical activity . Advanced directives . List of other physicians . Vitals . Screenings to include cognitive, depression, and falls . Referrals and appointments  In addition, I have reviewed and discussed with patient certain preventive protocols, quality metrics, and best practice recommendations. A written personalized care plan for preventive services as well as general preventive health recommendations were provided to patient.     Michiel Cowboy, RN  08/23/2017   Medical screening examination/treatment/procedure(s) were performed by non-physician practitioner and as supervising physician I was immediately available for consultation/collaboration. I agree with above. Lew Dawes, MD

## 2017-08-23 NOTE — Patient Instructions (Addendum)
Continue doing brain stimulating activities (puzzles, reading, adult coloring books, staying active) to keep memory sharp.   Continue to eat heart healthy diet (full of fruits, vegetables, whole grains, lean protein, water--limit salt, fat, and sugar intake) and increase physical activity as tolerated.   Ms. Deanna Salazar , Thank you for taking time to come for your Medicare Wellness Visit. I appreciate your ongoing commitment to your health goals. Please review the following plan we discussed and let me know if I can assist you in the future.   These are the goals we discussed: Goals    . lose weight     Continue to exercise several times weekly, read food labels to decrease carbohydrates and sugar, monitor my portions.  Enjoy life, family and church.    . Patient Stated     Stay as healthy and as independent as possible by eating healthy, stay physically and socially active, love family, God and enjoy life.     . Weight < 200 lb (90.719 kg)     Will continue the journey;  Will start to monitor sweets;  And keep exercising Low weight with some toning of muscles as tolerated       This is a list of the screening recommended for you and due dates:  Health Maintenance  Topic Date Due  . Eye exam for diabetics  08/24/1950  . Complete foot exam   12/06/2015  . Colon Cancer Screening  06/12/2017  . Flu Shot  08/08/2017  . Tetanus Vaccine  04/04/2024  . DEXA scan (bone density measurement)  Completed  . Pneumonia vaccines  Completed   Health Maintenance, Female Adopting a healthy lifestyle and getting preventive care can go a long way to promote health and wellness. Talk with your health care provider about what schedule of regular examinations is right for you. This is a good chance for you to check in with your provider about disease prevention and staying healthy. In between checkups, there are plenty of things you can do on your own. Experts have done a lot of research about which lifestyle  changes and preventive measures are most likely to keep you healthy. Ask your health care provider for more information. Weight and diet Eat a healthy diet  Be sure to include plenty of vegetables, fruits, low-fat dairy products, and lean protein.  Do not eat a lot of foods high in solid fats, added sugars, or salt.  Get regular exercise. This is one of the most important things you can do for your health. ? Most adults should exercise for at least 150 minutes each week. The exercise should increase your heart rate and make you sweat (moderate-intensity exercise). ? Most adults should also do strengthening exercises at least twice a week. This is in addition to the moderate-intensity exercise.  Maintain a healthy weight  Body mass index (BMI) is a measurement that can be used to identify possible weight problems. It estimates body fat based on height and weight. Your health care provider can help determine your BMI and help you achieve or maintain a healthy weight.  For females 35 years of age and older: ? A BMI below 18.5 is considered underweight. ? A BMI of 18.5 to 24.9 is normal. ? A BMI of 25 to 29.9 is considered overweight. ? A BMI of 30 and above is considered obese.  Watch levels of cholesterol and blood lipids  You should start having your blood tested for lipids and cholesterol at 78 years of  age, then have this test every 5 years.  You may need to have your cholesterol levels checked more often if: ? Your lipid or cholesterol levels are high. ? You are older than 78 years of age. ? You are at high risk for heart disease.  Cancer screening Lung Cancer  Lung cancer screening is recommended for adults 84-2 years old who are at high risk for lung cancer because of a history of smoking.  A yearly low-dose CT scan of the lungs is recommended for people who: ? Currently smoke. ? Have quit within the past 15 years. ? Have at least a 30-pack-year history of smoking. A pack  year is smoking an average of one pack of cigarettes a day for 1 year.  Yearly screening should continue until it has been 15 years since you quit.  Yearly screening should stop if you develop a health problem that would prevent you from having lung cancer treatment.  Breast Cancer  Practice breast self-awareness. This means understanding how your breasts normally appear and feel.  It also means doing regular breast self-exams. Let your health care provider know about any changes, no matter how small.  If you are in your 20s or 30s, you should have a clinical breast exam (CBE) by a health care provider every 1-3 years as part of a regular health exam.  If you are 33 or older, have a CBE every year. Also consider having a breast X-ray (mammogram) every year.  If you have a family history of breast cancer, talk to your health care provider about genetic screening.  If you are at high risk for breast cancer, talk to your health care provider about having an MRI and a mammogram every year.  Breast cancer gene (BRCA) assessment is recommended for women who have family members with BRCA-related cancers. BRCA-related cancers include: ? Breast. ? Ovarian. ? Tubal. ? Peritoneal cancers.  Results of the assessment will determine the need for genetic counseling and BRCA1 and BRCA2 testing.  Cervical Cancer Your health care provider may recommend that you be screened regularly for cancer of the pelvic organs (ovaries, uterus, and vagina). This screening involves a pelvic examination, including checking for microscopic changes to the surface of your cervix (Pap test). You may be encouraged to have this screening done every 3 years, beginning at age 86.  For women ages 48-65, health care providers may recommend pelvic exams and Pap testing every 3 years, or they may recommend the Pap and pelvic exam, combined with testing for human papilloma virus (HPV), every 5 years. Some types of HPV increase  your risk of cervical cancer. Testing for HPV may also be done on women of any age with unclear Pap test results.  Other health care providers may not recommend any screening for nonpregnant women who are considered low risk for pelvic cancer and who do not have symptoms. Ask your health care provider if a screening pelvic exam is right for you.  If you have had past treatment for cervical cancer or a condition that could lead to cancer, you need Pap tests and screening for cancer for at least 20 years after your treatment. If Pap tests have been discontinued, your risk factors (such as having a new sexual partner) need to be reassessed to determine if screening should resume. Some women have medical problems that increase the chance of getting cervical cancer. In these cases, your health care provider may recommend more frequent screening and Pap tests.  Colorectal  Cancer  This type of cancer can be detected and often prevented.  Routine colorectal cancer screening usually begins at 78 years of age and continues through 78 years of age.  Your health care provider may recommend screening at an earlier age if you have risk factors for colon cancer.  Your health care provider may also recommend using home test kits to check for hidden blood in the stool.  A small camera at the end of a tube can be used to examine your colon directly (sigmoidoscopy or colonoscopy). This is done to check for the earliest forms of colorectal cancer.  Routine screening usually begins at age 80.  Direct examination of the colon should be repeated every 5-10 years through 78 years of age. However, you may need to be screened more often if early forms of precancerous polyps or small growths are found.  Skin Cancer  Check your skin from head to toe regularly.  Tell your health care provider about any new moles or changes in moles, especially if there is a change in a mole's shape or color.  Also tell your health  care provider if you have a mole that is larger than the size of a pencil eraser.  Always use sunscreen. Apply sunscreen liberally and repeatedly throughout the day.  Protect yourself by wearing long sleeves, pants, a wide-brimmed hat, and sunglasses whenever you are outside.  Heart disease, diabetes, and high blood pressure  High blood pressure causes heart disease and increases the risk of stroke. High blood pressure is more likely to develop in: ? People who have blood pressure in the high end of the normal range (130-139/85-89 mm Hg). ? People who are overweight or obese. ? People who are African American.  If you are 68-40 years of age, have your blood pressure checked every 3-5 years. If you are 35 years of age or older, have your blood pressure checked every year. You should have your blood pressure measured twice-once when you are at a hospital or clinic, and once when you are not at a hospital or clinic. Record the average of the two measurements. To check your blood pressure when you are not at a hospital or clinic, you can use: ? An automated blood pressure machine at a pharmacy. ? A home blood pressure monitor.  If you are between 62 years and 57 years old, ask your health care provider if you should take aspirin to prevent strokes.  Have regular diabetes screenings. This involves taking a blood sample to check your fasting blood sugar level. ? If you are at a normal weight and have a low risk for diabetes, have this test once every three years after 78 years of age. ? If you are overweight and have a high risk for diabetes, consider being tested at a younger age or more often. Preventing infection Hepatitis B  If you have a higher risk for hepatitis B, you should be screened for this virus. You are considered at high risk for hepatitis B if: ? You were born in a country where hepatitis B is common. Ask your health care provider which countries are considered high risk. ? Your  parents were born in a high-risk country, and you have not been immunized against hepatitis B (hepatitis B vaccine). ? You have HIV or AIDS. ? You use needles to inject street drugs. ? You live with someone who has hepatitis B. ? You have had sex with someone who has hepatitis B. ? You  get hemodialysis treatment. ? You take certain medicines for conditions, including cancer, organ transplantation, and autoimmune conditions.  Hepatitis C  Blood testing is recommended for: ? Everyone born from [birth year] through 1966. ? Anyone with known risk factors for hepatitis C.  Sexually transmitted infections (STIs)  You should be screened for sexually transmitted infections (STIs) including gonorrhea and chlamydia if: ? You are sexually active and are younger than 78 years of age. ? You are older than 78 years of age and your health care provider tells you that you are at risk for this type of infection. ? Your sexual activity has changed since you were last screened and you are at an increased risk for chlamydia or gonorrhea. Ask your health care provider if you are at risk.  If you do not have HIV, but are at risk, it may be recommended that you take a prescription medicine daily to prevent HIV infection. This is called pre-exposure prophylaxis (PrEP). You are considered at risk if: ? You are sexually active and do not regularly use condoms or know the HIV status of your partner(s). ? You take drugs by injection. ? You are sexually active with a partner who has HIV.  Talk with your health care provider about whether you are at high risk of being infected with HIV. If you choose to begin PrEP, you should first be tested for HIV. You should then be tested every 3 months for as long as you are taking PrEP. Pregnancy  If you are premenopausal and you may become pregnant, ask your health care provider about preconception counseling.  If you may become pregnant, take 400 to 800 micrograms (mcg) of folic  acid every day.  If you want to prevent pregnancy, talk to your health care provider about birth control (contraception). Osteoporosis and menopause  Osteoporosis is a disease in which the bones lose minerals and strength with aging. This can result in serious bone fractures. Your risk for osteoporosis can be identified using a bone density scan.  If you are 70 years of age or older, or if you are at risk for osteoporosis and fractures, ask your health care provider if you should be screened.  Ask your health care provider whether you should take a calcium or vitamin D supplement to lower your risk for osteoporosis.  Menopause may have certain physical symptoms and risks.  Hormone replacement therapy may reduce some of these symptoms and risks. Talk to your health care provider about whether hormone replacement therapy is right for you. Follow these instructions at home:  Schedule regular health, dental, and eye exams.  Stay current with your immunizations.  Do not use any tobacco products including cigarettes, chewing tobacco, or electronic cigarettes.  If you are pregnant, do not drink alcohol.  If you are breastfeeding, limit how much and how often you drink alcohol.  Limit alcohol intake to no more than 1 drink per day for nonpregnant women. One drink equals 12 ounces of beer, 5 ounces of Huyen Perazzo, or 1 ounces of hard liquor.  Do not use street drugs.  Do not share needles.  Ask your health care provider for help if you need support or information about quitting drugs.  Tell your health care provider if you often feel depressed.  Tell your health care provider if you have ever been abused or do not feel safe at home. This information is not intended to replace advice given to you by your health care provider. Make sure you discuss  any questions you have with your health care provider. Document Released: 07/10/2010 Document Revised: 06/02/2015 Document Reviewed:  09/28/2014 Elsevier Interactive Patient Education  [employer].

## 2017-08-26 ENCOUNTER — Ambulatory Visit: Payer: PPO

## 2017-08-30 ENCOUNTER — Encounter: Payer: Self-pay | Admitting: Internal Medicine

## 2017-09-04 DIAGNOSIS — Z961 Presence of intraocular lens: Secondary | ICD-10-CM | POA: Diagnosis not present

## 2017-09-04 DIAGNOSIS — H40011 Open angle with borderline findings, low risk, right eye: Secondary | ICD-10-CM | POA: Diagnosis not present

## 2017-09-04 DIAGNOSIS — H401122 Primary open-angle glaucoma, left eye, moderate stage: Secondary | ICD-10-CM | POA: Diagnosis not present

## 2017-09-04 DIAGNOSIS — H20012 Primary iridocyclitis, left eye: Secondary | ICD-10-CM | POA: Diagnosis not present

## 2017-09-19 ENCOUNTER — Telehealth: Payer: Self-pay | Admitting: Gastroenterology

## 2017-09-19 MED ORDER — RABEPRAZOLE SODIUM 20 MG PO TBEC: 20.0000 mg | DELAYED_RELEASE_TABLET | Freq: Every day | ORAL | 1 refills | Status: DC

## 2017-09-19 NOTE — Telephone Encounter (Signed)
Patient states she didn't realize that pantoprazole was the same medication she has taken in the past that upset her stomach. Patient states she started having upper abdominal burning and chest pain after taking this medication for several weeks. Patient states she had these symptoms before when she took pantoprazole. According to Dr. Lynne Leader note from last year if we have tried esomeprazole and pantoprazole then we will send in aciphex to the pharmacy in there place. Patient agreed and verbalized understanding.

## 2017-09-30 ENCOUNTER — Encounter: Payer: Self-pay | Admitting: Gastroenterology

## 2017-09-30 ENCOUNTER — Ambulatory Visit (AMBULATORY_SURGERY_CENTER): Payer: Self-pay | Admitting: *Deleted

## 2017-09-30 VITALS — Ht 63.0 in | Wt 203.0 lb

## 2017-09-30 DIAGNOSIS — Z800 Family history of malignant neoplasm of digestive organs: Secondary | ICD-10-CM

## 2017-09-30 DIAGNOSIS — Z86010 Personal history of colonic polyps: Secondary | ICD-10-CM

## 2017-09-30 MED ORDER — NA SULFATE-K SULFATE-MG SULF 17.5-3.13-1.6 GM/177ML PO SOLN: ORAL | 0 refills | Status: DC

## 2017-09-30 NOTE — Progress Notes (Signed)
Patient denies any allergies to eggs or soy. Patient denies any problems with anesthesia/sedation. Patient denies any oxygen use at home. Patient denies taking any diet/weight loss medications or blood thinners. EMMI education offered, pt declined. Suprep sample given to pt, lot [id number]. 8/21, 2 day prep instructions also. Called pt's pharmacy and cx Suprep that was sent in today, sample given to pt.

## 2017-10-07 ENCOUNTER — Encounter: Payer: Self-pay | Admitting: Gastroenterology

## 2017-10-07 ENCOUNTER — Ambulatory Visit (AMBULATORY_SURGERY_CENTER): Payer: PPO | Admitting: Gastroenterology

## 2017-10-07 VITALS — BP 125/63 | HR 55 | Temp 97.7°F | Resp 10 | Ht 63.0 in | Wt 204.0 lb

## 2017-10-07 DIAGNOSIS — Z860101 Personal history of adenomatous and serrated colon polyps: Secondary | ICD-10-CM

## 2017-10-07 DIAGNOSIS — M797 Fibromyalgia: Secondary | ICD-10-CM | POA: Diagnosis not present

## 2017-10-07 DIAGNOSIS — Z800 Family history of malignant neoplasm of digestive organs: Secondary | ICD-10-CM

## 2017-10-07 DIAGNOSIS — Z86010 Personal history of colonic polyps: Secondary | ICD-10-CM | POA: Diagnosis not present

## 2017-10-07 DIAGNOSIS — D125 Benign neoplasm of sigmoid colon: Secondary | ICD-10-CM

## 2017-10-07 DIAGNOSIS — I10 Essential (primary) hypertension: Secondary | ICD-10-CM | POA: Diagnosis not present

## 2017-10-07 DIAGNOSIS — R42 Dizziness and giddiness: Secondary | ICD-10-CM | POA: Diagnosis not present

## 2017-10-07 DIAGNOSIS — M069 Rheumatoid arthritis, unspecified: Secondary | ICD-10-CM | POA: Diagnosis not present

## 2017-10-07 MED ORDER — SODIUM CHLORIDE 0.9 % IV SOLN
500.0000 mL | Freq: Once | INTRAVENOUS | Status: DC
Start: 2017-10-07 — End: 2017-10-07

## 2017-10-07 NOTE — Op Note (Signed)
[facility] Patient Name: Deanna Salazar Procedure Date: 10/07/2017 10:41 AM MRN: [id number] Endoscopist: Ladene Artist , MD Age: 77 Referring MD:  Date of Birth: 15-Oct-1940 Gender: Female Account #: [id number] Procedure:                Colonoscopy Indications:              Surveillance: Personal history of adenomatous                            polyps on last colonoscopy 5 years ago. Family                            history of colon cancer. Medicines:                Monitored Anesthesia Care Procedure:                Pre-Anesthesia Assessment:                           - Prior to the procedure, a History and Physical                            was performed, and patient medications and                            allergies were reviewed. The patient's tolerance of                            previous anesthesia was also reviewed. The risks                            and benefits of the procedure and the sedation                            options and risks were discussed with the patient.                            All questions were answered, and informed consent                            was obtained. Prior Anticoagulants: The patient has                            taken no previous anticoagulant or antiplatelet                            agents. ASA Grade Assessment: III - A patient with                            severe systemic disease. After reviewing the risks                            and benefits, the patient was deemed in  satisfactory condition to undergo the procedure.                           After obtaining informed consent, the colonoscope                            was passed under direct vision. Throughout the                            procedure, the patient's blood pressure, pulse, and                            oxygen saturations were monitored continuously. The                            Colonoscope was introduced through  the anus and                            advanced to the the cecum, identified by                            appendiceal orifice and ileocecal valve. The                            ileocecal valve, appendiceal orifice, and rectum                            were photographed. The quality of the bowel                            preparation was good. The colonoscopy was performed                            without difficulty. The patient tolerated the                            procedure well. Scope In: 10:55:31 AM Scope Out: 11:08:49 AM Scope Withdrawal Time: 0 hours 8 minutes 50 seconds  Total Procedure Duration: 0 hours 13 minutes 18 seconds  Findings:                 The perianal and digital rectal examinations were                            normal.                           A 4 mm polyp was found in the sigmoid colon. The                            polyp was sessile. The polyp was removed with a                            cold biopsy forceps. Resection and retrieval were  complete.                           The exam was otherwise without abnormality on                            direct and retroflexion views. Complications:            No immediate complications. Estimated blood loss:                            None. Estimated Blood Loss:     Estimated blood loss: none. Impression:               - One 4 mm polyp in the sigmoid colon, removed with                            a cold biopsy forceps. Resected and retrieved.                           - The examination was otherwise normal on direct                            and retroflexion views. Recommendation:           - Patient has a contact number available for                            emergencies. The signs and symptoms of potential                            delayed complications were discussed with the                            patient. Return to normal activities tomorrow.                             Written discharge instructions were provided to the                            patient.                           - Resume previous diet.                           - Continue present medications.                           - Await pathology results.                           - No repeat colonoscopy due to age. Ladene Artist, MD 10/07/2017 11:16:30 AM This report has been signed electronically.

## 2017-10-07 NOTE — Progress Notes (Signed)
Called to room to assist during endoscopic procedure.  Patient ID and intended procedure confirmed with present staff. Received instructions for my participation in the procedure from the performing physician.  

## 2017-10-07 NOTE — Progress Notes (Signed)
Pt's states no medical or surgical changes since previsit or office visit. 

## 2017-10-07 NOTE — Progress Notes (Signed)
Report to PACU, RN, vss, BBS= Clear.  

## 2017-10-07 NOTE — Patient Instructions (Signed)
*  Handout given on Polyps.  YOU HAD AN ENDOSCOPIC PROCEDURE TODAY AT [facility]:   Refer to the procedure report that was given to you for any specific questions about what was found during the examination.  If the procedure report does not answer your questions, please call your gastroenterologist to clarify.  If you requested that your care partner not be given the details of your procedure findings, then the procedure report has been included in a sealed envelope for you to review at your convenience later.  YOU SHOULD EXPECT: Some feelings of bloating in the abdomen. Passage of more gas than usual.  Walking can help get rid of the air that was put into your GI tract during the procedure and reduce the bloating. If you had a lower endoscopy (such as a colonoscopy or flexible sigmoidoscopy) you may notice spotting of blood in your stool or on the toilet paper. If you underwent a bowel prep for your procedure, you may not have a normal bowel movement for a few days.  Please Note:  You might notice some irritation and congestion in your nose or some drainage.  This is from the oxygen used during your procedure.  There is no need for concern and it should clear up in a day or so.  SYMPTOMS TO REPORT IMMEDIATELY:   Following lower endoscopy (colonoscopy or flexible sigmoidoscopy):  Excessive amounts of blood in the stool  Significant tenderness or worsening of abdominal pains  Swelling of the abdomen that is new, acute  Fever of 100F or higher   For urgent or emergent issues, a gastroenterologist can be reached at any hour by calling [phone].   DIET:  We do recommend a small meal at first, but then you may proceed to your regular diet.  Drink plenty of fluids but you should avoid alcoholic beverages for 24 hours.  ACTIVITY:  You should plan to take it easy for the rest of today and you should NOT DRIVE or use heavy machinery until tomorrow (because of the sedation  medicines used during the test).    FOLLOW UP: Our staff will call the number listed on your records the next business day following your procedure to check on you and address any questions or concerns that you may have regarding the information given to you following your procedure. If we do not reach you, we will leave a message.  However, if you are feeling well and you are not experiencing any problems, there is no need to return our call.  We will assume that you have returned to your regular daily activities without incident.  If any biopsies were taken you will be contacted by phone or by letter within the next 1-3 weeks.  Please call us at [phone] if you have not heard about the biopsies in 3 weeks.    SIGNATURES/CONFIDENTIALITY: You and/or your care partner have signed paperwork which will be entered into your electronic medical record.  These signatures attest to the fact that that the information above on your After Visit Summary has been reviewed and is understood.  Full responsibility of the confidentiality of this discharge information lies with you and/or your care-partner.

## 2017-10-08 ENCOUNTER — Telehealth: Payer: Self-pay | Admitting: *Deleted

## 2017-10-08 NOTE — Telephone Encounter (Signed)
  Follow up Call-  Call back number 10/07/2017 02/28/2016  Post procedure Call Back phone  # [phone]  Permission to leave phone message Yes Yes  Some recent data might be hidden     Patient questions:  Do you have a fever, pain , or abdominal swelling? No. Pain Score  0 *  Have you tolerated food without any problems? Yes.    Have you been able to return to your normal activities? Yes.    Do you have any questions about your discharge instructions: Diet   No. Medications  No. Follow up visit  No.  Do you have questions or concerns about your Care? No.  Actions: * If pain score is 4 or above: No action needed, pain <4.

## 2017-10-11 ENCOUNTER — Ambulatory Visit (INDEPENDENT_AMBULATORY_CARE_PROVIDER_SITE_OTHER): Payer: PPO

## 2017-10-11 DIAGNOSIS — Z23 Encounter for immunization: Secondary | ICD-10-CM

## 2017-10-21 ENCOUNTER — Encounter: Payer: Self-pay | Admitting: Gastroenterology

## 2017-10-31 ENCOUNTER — Other Ambulatory Visit: Payer: Self-pay | Admitting: Internal Medicine

## 2017-11-28 ENCOUNTER — Ambulatory Visit (INDEPENDENT_AMBULATORY_CARE_PROVIDER_SITE_OTHER): Payer: PPO | Admitting: Internal Medicine

## 2017-11-28 ENCOUNTER — Encounter: Payer: Self-pay | Admitting: Internal Medicine

## 2017-11-28 DIAGNOSIS — R0989 Other specified symptoms and signs involving the circulatory and respiratory systems: Secondary | ICD-10-CM

## 2017-11-28 DIAGNOSIS — J40 Bronchitis, not specified as acute or chronic: Secondary | ICD-10-CM | POA: Diagnosis not present

## 2017-11-28 DIAGNOSIS — E785 Hyperlipidemia, unspecified: Secondary | ICD-10-CM

## 2017-11-28 MED ORDER — FLUTICASONE FUROATE-VILANTEROL 100-25 MCG/INH IN AEPB: 1.0000 | INHALATION_SPRAY | Freq: Every day | RESPIRATORY_TRACT | 5 refills | Status: DC

## 2017-11-28 MED ORDER — METHYLPREDNISOLONE ACETATE 80 MG/ML IJ SUSP
80.0000 mg | Freq: Once | INTRAMUSCULAR | Status: AC
  Administered 2017-11-28: 80 mg via INTRAMUSCULAR

## 2017-11-28 MED ORDER — AZITHROMYCIN 250 MG PO TABS: ORAL_TABLET | ORAL | 0 refills | Status: DC

## 2017-11-28 MED ORDER — BENZONATATE 200 MG PO CAPS: 200.0000 mg | ORAL_CAPSULE | Freq: Two times a day (BID) | ORAL | 1 refills | Status: DC | PRN

## 2017-11-28 NOTE — Assessment & Plan Note (Signed)
Asthmatic compotic  Breo Depo-medrol

## 2017-11-28 NOTE — Addendum Note (Signed)
Addended by: Karren Cobble on: 11/28/2017 11:44 AM   Modules accepted: Orders

## 2017-11-28 NOTE — Patient Instructions (Addendum)
Cardiac CT calcium scoring test $150   Computed tomography, more commonly known as a CT or CAT scan, is a diagnostic medical imaging test. Like traditional x-rays, it produces multiple images or pictures of the inside of the body. The cross-sectional images generated during a CT scan can be reformatted in multiple planes. They can even generate three-dimensional images. These images can be viewed on a computer monitor, printed on film or by a 3D printer, or transferred to a CD or DVD. CT images of internal organs, bones, soft tissue and blood vessels provide greater detail than traditional x-rays, particularly of soft tissues and blood vessels. A cardiac CT scan for coronary calcium is a non-invasive way of obtaining information about the presence, location and extent of calcified plaque in the coronary arteries-the vessels that supply oxygen-containing blood to the heart muscle. Calcified plaque results when there is a build-up of fat and other substances under the inner layer of the artery. This material can calcify which signals the presence of atherosclerosis, a disease of the vessel wall, also called coronary artery disease (CAD). People with this disease have an increased risk for heart attacks. In addition, over time, progression of plaque build up (CAD) can narrow the arteries or even close off blood flow to the heart. The result may be chest pain, sometimes called "angina," or a heart attack. Because calcium is a marker of CAD, the amount of calcium detected on a cardiac CT scan is a helpful prognostic tool. The findings on cardiac CT are expressed as a calcium score. Another name for this test is coronary artery calcium scoring.  What are some common uses of the procedure? The goal of cardiac CT scan for calcium scoring is to determine if CAD is present and to what extent, even if there are no symptoms. It is a screening study that may be recommended by a physician for patients with risk factors  for CAD but no clinical symptoms. The major risk factors for CAD are: . high blood cholesterol levels  . family history of heart attacks  . diabetes  . high blood pressure  . cigarette smoking  . overweight or obese  . physical inactivity   A negative cardiac CT scan for calcium scoring shows no calcification within the coronary arteries. This suggests that CAD is absent or so minimal it cannot be seen by this technique. The chance of having a heart attack over the next two to five years is very low under these circumstances. A positive test means that CAD is present, regardless of whether or not the patient is experiencing any symptoms. The amount of calcification-expressed as the calcium score-may help to predict the likelihood of a myocardial infarction (heart attack) in the coming years and helps your medical doctor or cardiologist decide whether the patient may need to take preventive medicine or undertake other measures such as diet and exercise to lower the risk for heart attack. The extent of CAD is graded according to your calcium score:  Calcium Score  Presence of CAD  0 No evidence of CAD   1-10 Minimal evidence of CAD  11-100 Mild evidence of CAD  101-400 Moderate evidence of CAD  Over 400 Extensive evidence of CAD     You can use over-the-counter  "cold" medicines  such as "Afrin" nasal spray for nasal congestion as directed. Use " Delsym" or" Robitussin" cough syrup varietis for cough.  You can use plain "Tylenol" or "Advil" for fever, chills and achyness. Use Halls  or Ricola cough drops.    Please, make an appointment if you are not better or if you're worse.

## 2017-11-28 NOTE — Assessment & Plan Note (Signed)
CT Ca scoring option discussed

## 2017-11-28 NOTE — Progress Notes (Signed)
Subjective:  Patient ID: Deanna Salazar, female    DOB: [birth year]  Age: 77 y.o. MRN: [id number]  CC: No chief complaint on file.   HPI [employer] presents for cough, sinus and chest congestion x 2-3 weeks, worse...  Outpatient Medications Prior to Visit  Medication Sig Dispense Refill  . acetaminophen (TYLENOL) 500 MG tablet Take 500-1,000 mg by mouth every 6 (six) hours as needed for mild pain, moderate pain, fever or headache.    Marland Kitchen amLODipine (NORVASC) 5 MG tablet Take 1 tablet (5 mg total) by mouth daily. 90 tablet 2  . aspirin (ASPIRIN CHILDRENS) 81 MG chewable tablet Chew 1 tablet (81 mg total) by mouth daily. 36 tablet 11  . carvedilol (COREG) 12.5 MG tablet Take 1 tablet (12.5 mg total) 2 (two) times daily with a meal by mouth. 180 tablet 3  . cholecalciferol (VITAMIN D) 1000 units tablet Take 1,000 Units by mouth daily.    Marland Kitchen escitalopram (LEXAPRO) 5 MG tablet Take 1 tablet (5 mg total) by mouth daily. 30 tablet 5  . furosemide (LASIX) 20 MG tablet Take 1 tablet (20 mg total) by mouth daily. 90 tablet 3  . latanoprost (XALATAN) 0.005 % ophthalmic solution INT 1 GTT IN OU Q NIGHT  3  . levothyroxine (SYNTHROID, LEVOTHROID) 25 MCG tablet TAKE 1 TABLET BY MOUTH ONCE DAILY BEFORE BREAKFAST 90 tablet 3  . linaclotide (LINZESS) 290 MCG CAPS capsule Take 1 capsule (290 mcg total) by mouth daily before breakfast. 12 capsule 0  . loratadine (CLARITIN) 10 MG tablet Take 1 tablet (10 mg total) by mouth daily. (Patient taking differently: Take 10 mg by mouth daily as needed. ) 100 tablet 3  . meclizine (ANTIVERT) 25 MG tablet TAKE 1 TABLET BY MOUTH THREE TIMES A DAY IF NEEDED FOR DIZZINESS OR NAUSEA 30 tablet 0  . Multiple Vitamins-Minerals (PRESERVISION AREDS 2+MULTI VIT PO) Take by mouth.    Vladimir Faster Glycol-Propyl Glycol (SYSTANE OP) Apply to eye.    . Polyethylene Glycol 3350 (MIRALAX PO) Take by mouth.    . potassium chloride (K-DUR) 10 MEQ tablet take 1 tablet by mouth once  daily 90 tablet 3  . RABEprazole (ACIPHEX) 20 MG tablet Take 1 tablet (20 mg total) by mouth daily. 60 tablet 1  . telmisartan (MICARDIS) 40 MG tablet Take 1 tablet (40 mg total) by mouth daily. 90 tablet 3  . temazepam (RESTORIL) 30 MG capsule Take 1 capsule (30 mg total) by mouth at bedtime as needed for sleep. 90 capsule 1  . TRAVATAN Z 0.004 % SOLN ophthalmic solution INT 1 GTT IN OU HS  11   No facility-administered medications prior to visit.     ROS: Review of Systems  Constitutional: Negative for activity change, appetite change, chills, fatigue and unexpected weight change.  HENT: Positive for congestion. Negative for mouth sores and sinus pressure.   Eyes: Negative for visual disturbance.  Respiratory: Positive for cough, chest tightness and shortness of breath.   Cardiovascular: Negative for leg swelling.  Gastrointestinal: Negative for abdominal pain and nausea.  Genitourinary: Negative for difficulty urinating, frequency and vaginal pain.  Musculoskeletal: Negative for back pain and gait problem.  Skin: Negative for pallor and rash.  Neurological: Negative for dizziness, tremors, weakness, numbness and headaches.  Psychiatric/Behavioral: Negative for confusion, sleep disturbance and suicidal ideas.    Objective:  BP 132/82 (BP Location: Right Arm, Patient Position: Sitting, Cuff Size: Large)   Pulse (!) 58   Temp 98.1  F (36.7 C) (Oral)   Ht 5\' 3"  (1.6 m)   Wt 204 lb (92.5 kg)   SpO2 99%   BMI 36.14 kg/m   BP Readings from Last 3 Encounters:  11/28/17 132/82  10/07/17 125/63  08/23/17 126/62    Wt Readings from Last 3 Encounters:  11/28/17 204 lb (92.5 kg)  10/07/17 204 lb (92.5 kg)  09/30/17 203 lb (92.1 kg)    Physical Exam  Constitutional: She appears well-developed. No distress.  HENT:  Head: Normocephalic.  Right Ear: External ear normal.  Left Ear: External ear normal.  Nose: Nose normal.  Mouth/Throat: Oropharynx is clear and moist.  Eyes:  Pupils are equal, round, and reactive to light. Conjunctivae are normal. Right eye exhibits no discharge. Left eye exhibits no discharge.  Neck: Normal range of motion. Neck supple. No JVD present. No tracheal deviation present. No thyromegaly present.  Cardiovascular: Normal rate, regular rhythm and normal heart sounds.  Pulmonary/Chest: No stridor. No respiratory distress. She has no wheezes.  Abdominal: Soft. Bowel sounds are normal. She exhibits no distension and no mass. There is no tenderness. There is no rebound and no guarding.  Musculoskeletal: She exhibits no edema or tenderness.  Lymphadenopathy:    She has no cervical adenopathy.  Neurological: She displays normal reflexes. No cranial nerve deficit. She exhibits normal muscle tone. Coordination normal.  Skin: No rash noted. No erythema.  Psychiatric: She has a normal mood and affect. Her behavior is normal. Judgment and thought content normal.  eryth nares  I personally provided Breo inhaler use teaching. After the teaching patient was able to demonstrate it's use effectively. All questions were answered   Lab Results  Component Value Date   WBC 6.5 05/16/2016   HGB 12.6 05/16/2016   HCT 38.3 05/16/2016   PLT 218.0 05/16/2016   GLUCOSE 125 (H) 07/05/2017   CHOL 198 07/05/2017   TRIG 118.0 07/05/2017   HDL 47.60 07/05/2017   LDLDIRECT 180.9 07/26/2009   LDLCALC 127 (H) 07/05/2017   ALT 14 07/05/2017   AST 22 07/05/2017   NA 140 07/05/2017   K 4.3 07/05/2017   CL 106 07/05/2017   CREATININE 0.94 07/05/2017   BUN 13 07/05/2017   CO2 27 07/05/2017   TSH 1.44 07/05/2017   HGBA1C 6.4 07/05/2017   MICROALBUR 0.7 08/22/2016    Mm Screening Breast Tomo Bilateral  Result Date: 02/06/2017 CLINICAL DATA:  Screening. EXAM: 2D DIGITAL SCREENING BILATERAL MAMMOGRAM WITH 3D TOMO WITH CAD COMPARISON:  Previous exam(s). ACR Breast Density Category b: There are scattered areas of fibroglandular density. FINDINGS: There are no  findings suspicious for malignancy. Stable postsurgical changes on the left. Images were processed with CAD. IMPRESSION: No mammographic evidence of malignancy. A result letter of this screening mammogram will be mailed directly to the patient. RECOMMENDATION: Screening mammogram in one year. (Code:SM-B-01Y) BI-RADS CATEGORY  2: Benign. Electronically Signed   By: Lajean Manes M.D.   On: 02/06/2017 11:08    Assessment & Plan:   There are no diagnoses linked to this encounter.   No orders of the defined types were placed in this encounter.    Follow-up: No follow-ups on file.  Walker Kehr, MD

## 2017-11-28 NOTE — Assessment & Plan Note (Signed)
Zpack Tessalon

## 2017-12-14 ENCOUNTER — Other Ambulatory Visit: Payer: Self-pay | Admitting: Internal Medicine

## 2017-12-17 ENCOUNTER — Telehealth: Payer: Self-pay | Admitting: Internal Medicine

## 2017-12-17 MED ORDER — CYCLOBENZAPRINE HCL 5 MG PO TABS: 5.0000 mg | ORAL_TABLET | Freq: Three times a day (TID) | ORAL | 1 refills | Status: DC | PRN

## 2017-12-17 NOTE — Telephone Encounter (Signed)
Ok Flexeril Thx 

## 2017-12-17 NOTE — Telephone Encounter (Signed)
Please advise 

## 2017-12-17 NOTE — Telephone Encounter (Signed)
Copied from [place] [id number]. Topic: Quick Communication - Rx Refill/Question >> Dec 17, 2017  2:56 PM Burchel, Abbi R wrote: Medication: muscle relaxer   Pt states she discussed adding a muscle relaxer to help her rest during her last OV w/Dr Plotnikov.  Please send to preferred Pharmacy if appropriate.  [facility] [phone] - [address], [place] [facility] [place] [address] Phone: [phone] Fax: [phone]

## 2017-12-19 NOTE — Telephone Encounter (Signed)
Pt called to check status of muscle relaxer. I told her RX was sent in 12/10 for cyclobenzaprine. Pt states she has checked with the pharmacy. I called [facility] and spoke with Merrilee Seashore. He stated RX requires PA and form was faxed to office. Insurance denied and did not offered covered alternative. Please advise.

## 2017-12-24 ENCOUNTER — Other Ambulatory Visit: Payer: Self-pay | Admitting: Internal Medicine

## 2018-01-07 ENCOUNTER — Ambulatory Visit: Payer: PPO | Admitting: Internal Medicine

## 2018-01-13 ENCOUNTER — Other Ambulatory Visit: Payer: Self-pay | Admitting: Internal Medicine

## 2018-01-13 DIAGNOSIS — Z1231 Encounter for screening mammogram for malignant neoplasm of breast: Secondary | ICD-10-CM

## 2018-01-15 ENCOUNTER — Telehealth: Payer: Self-pay

## 2018-01-15 DIAGNOSIS — N644 Mastodynia: Secondary | ICD-10-CM

## 2018-01-15 NOTE — Telephone Encounter (Signed)
Orders entered  Copied from [place] [phone]. Topic: Referral - Question >> Jan 13, 2018 10:53 AM Conception Chancy, NT wrote: Reason for CRM: patient is calling and states she gets her mammograms done at the [facility] and called them to schedule appointment and was informed she would need a diagnostic mammogram on her left breast due to enlarged scar tissue and pain. Please advise.

## 2018-01-31 ENCOUNTER — Telehealth: Payer: Self-pay | Admitting: Gastroenterology

## 2018-01-31 NOTE — Telephone Encounter (Signed)
Patient wants to discuss medication with nurse (linzess)

## 2018-01-31 NOTE — Telephone Encounter (Signed)
Patient states her Linzess can be expensive and wants to try some OTC laxatives but wanted to make sure it was safe to take along with her stomach medication. Informed patient that she can take any of those OTC laxatives along with her Aciphex. Patient verbalized understanding.

## 2018-01-31 NOTE — Telephone Encounter (Signed)
Left a message for patient to return my call. 

## 2018-02-07 ENCOUNTER — Ambulatory Visit
Admission: RE | Admit: 2018-02-07 | Discharge: 2018-02-07 | Disposition: A | Discharge status: Home / Self Care | Payer: PPO | Source: Ambulatory Visit | Attending: Internal Medicine | Admitting: Internal Medicine

## 2018-02-07 ENCOUNTER — Ambulatory Visit: Payer: PPO

## 2018-02-07 DIAGNOSIS — Z853 Personal history of malignant neoplasm of breast: Secondary | ICD-10-CM | POA: Diagnosis not present

## 2018-02-07 DIAGNOSIS — N644 Mastodynia: Secondary | ICD-10-CM

## 2018-02-07 DIAGNOSIS — R928 Other abnormal and inconclusive findings on diagnostic imaging of breast: Secondary | ICD-10-CM | POA: Diagnosis not present

## 2018-02-07 HISTORY — DX: Personal history of irradiation: Z92.3

## 2018-02-28 ENCOUNTER — Other Ambulatory Visit: Payer: Self-pay | Admitting: Internal Medicine

## 2018-03-17 ENCOUNTER — Ambulatory Visit: Payer: PPO | Admitting: Internal Medicine

## 2018-03-24 ENCOUNTER — Other Ambulatory Visit: Payer: Self-pay | Admitting: Internal Medicine

## 2018-03-25 ENCOUNTER — Ambulatory Visit: Payer: PPO | Admitting: Internal Medicine

## 2018-04-14 ENCOUNTER — Telehealth: Payer: Self-pay | Admitting: Internal Medicine

## 2018-04-14 NOTE — Telephone Encounter (Signed)
Noted.  I think the patient is sneezing because of his seasonal allergies.  Take generic Claritin 1 a day If no better, will switch to something else.  However, it is very unlikely that telmisartan is causing Meyah to sneeze. Thank you

## 2018-04-14 NOTE — Telephone Encounter (Signed)
Patient left voicemail message saying that her medication Telmisartan makes her sneeze a lot, she wants to know can this medication be changed to something else

## 2018-04-14 NOTE — Telephone Encounter (Signed)
Pt is requesting her telmisartan changed due to allergy to it. Pt states that is causing sneezing.

## 2018-04-15 MED ORDER — CARVEDILOL 12.5 MG PO TABS: 25.0000 mg | ORAL_TABLET | Freq: Two times a day (BID) | ORAL | 3 refills | Status: DC

## 2018-04-15 NOTE — Telephone Encounter (Signed)
Pt notified, appt made

## 2018-04-15 NOTE — Telephone Encounter (Signed)
Noted.  Discontinue telmisartan.  Increase Coreg to 25 mg twice a day.  Follow-up with me in 1 month.  Continue Claritin Thank you

## 2018-04-15 NOTE — Telephone Encounter (Signed)
Pt states she has been taking generic Claritin since her last OV (11/2017) and it has not been helping. She states she also stopped the telmisartan for a few days and the sneezing and sinus problems started to go away. Please advise

## 2018-05-13 ENCOUNTER — Ambulatory Visit (INDEPENDENT_AMBULATORY_CARE_PROVIDER_SITE_OTHER): Payer: PPO | Admitting: Internal Medicine

## 2018-05-13 ENCOUNTER — Encounter: Payer: Self-pay | Admitting: Internal Medicine

## 2018-05-13 DIAGNOSIS — R55 Syncope and collapse: Secondary | ICD-10-CM | POA: Diagnosis not present

## 2018-05-13 DIAGNOSIS — M06031 Rheumatoid arthritis without rheumatoid factor, right wrist: Secondary | ICD-10-CM | POA: Diagnosis not present

## 2018-05-13 DIAGNOSIS — M06032 Rheumatoid arthritis without rheumatoid factor, left wrist: Secondary | ICD-10-CM

## 2018-05-13 DIAGNOSIS — E785 Hyperlipidemia, unspecified: Secondary | ICD-10-CM | POA: Diagnosis not present

## 2018-05-13 DIAGNOSIS — I10 Essential (primary) hypertension: Secondary | ICD-10-CM

## 2018-05-13 MED ORDER — CARVEDILOL 25 MG PO TABS: 25.0000 mg | ORAL_TABLET | Freq: Two times a day (BID) | ORAL | 3 refills | Status: DC

## 2018-05-13 NOTE — Assessment & Plan Note (Signed)
No relapse 

## 2018-05-13 NOTE — Progress Notes (Signed)
Virtual Visit via Telephone Note  I connected with Deanna Salazar on 05/13/18 at 10:40 AM EDT by telephone and verified that I am speaking with the correct person using two identifiers.   I discussed the limitations, risks, security and privacy concerns of performing an evaluation and management service by telephone and the availability of in person appointments. I also discussed with the patient that there may be a patient responsible charge related to this service. The patient expressed understanding and agreed to proceed.   History of Present Illness:   The patient is complaining of more arthralgias since she stopped going to [facility].  She is wondering about how much Tylenol she can take. The patient is complaining of low blood pressure at times.  She is asking about her blood pressure medicines-how she should take it when her blood pressure is low.  There is no chest pain, shortness of breath, leg swelling, diarrhea. Observations/Objective:  The patient sounds normal.  She is alert, oriented and cooperative Assessment and Plan:  See plan.  COVID-19 issues discussed Follow Up Instructions:    I discussed the assessment and treatment plan with the patient. The patient was provided an opportunity to ask questions and all were answered. The patient agreed with the plan and demonstrated an understanding of the instructions.   The patient was advised to call back or seek an in-person evaluation if the symptoms worsen or if the condition fails to improve as anticipated.  Walker Kehr, MD

## 2018-05-13 NOTE — Assessment & Plan Note (Signed)
Continue with amlodipine.  Coreg 25 mg twice a day.  If blood pressure is on the low side of normal- take Coreg 12.5 mg twice a day

## 2018-05-13 NOTE — Assessment & Plan Note (Signed)
Statin intolerant 

## 2018-05-13 NOTE — Assessment & Plan Note (Signed)
Worsening arthralgias.  Continue with Tylenol 1-2 p.o. up to 4 times a day.  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  COVID-19 precautions discussed

## 2018-06-30 ENCOUNTER — Other Ambulatory Visit: Payer: Self-pay | Admitting: Internal Medicine

## 2018-07-09 ENCOUNTER — Telehealth: Payer: Self-pay | Admitting: Internal Medicine

## 2018-07-09 MED ORDER — TEMAZEPAM 15 MG PO CAPS: 15.0000 mg | ORAL_CAPSULE | Freq: Every evening | ORAL | 0 refills | Status: DC | PRN

## 2018-07-09 NOTE — Telephone Encounter (Signed)
Pt is taking temazepam (RESTORIL) 30 MG capsule and trying to stop taking it. Pt want to know if you can decrease the dosage because when she stop taking it she had bad dreams and can't sleep at all. Please advise pt.

## 2018-07-09 NOTE — Telephone Encounter (Signed)
Ok - 15 mg Thx

## 2018-07-09 NOTE — Telephone Encounter (Signed)
Pt informed of below.  

## 2018-08-07 ENCOUNTER — Other Ambulatory Visit: Payer: Self-pay | Admitting: *Deleted

## 2018-08-07 NOTE — Telephone Encounter (Signed)
Pt is calling to let md know temazepam 15 mg is working and she needs a refill [facility] on [address]

## 2018-08-07 NOTE — Telephone Encounter (Signed)
[place] Controlled Database Checked Last filled: 07/09/18 # 30 LOV w/you: 05/13/18 Next appt w/you: 09/01/18

## 2018-08-08 ENCOUNTER — Other Ambulatory Visit: Payer: Self-pay | Admitting: Internal Medicine

## 2018-08-10 MED ORDER — TEMAZEPAM 15 MG PO CAPS: 15.0000 mg | ORAL_CAPSULE | Freq: Every evening | ORAL | 5 refills | Status: DC | PRN

## 2018-08-23 ENCOUNTER — Other Ambulatory Visit: Payer: Self-pay | Admitting: Internal Medicine

## 2018-08-28 ENCOUNTER — Telehealth: Payer: Self-pay | Admitting: *Deleted

## 2018-08-28 DIAGNOSIS — E785 Hyperlipidemia, unspecified: Secondary | ICD-10-CM

## 2018-08-28 DIAGNOSIS — E119 Type 2 diabetes mellitus without complications: Secondary | ICD-10-CM

## 2018-08-28 DIAGNOSIS — Z0000 Encounter for general adult medical examination without abnormal findings: Secondary | ICD-10-CM

## 2018-08-28 NOTE — Telephone Encounter (Signed)
Patient states she would like to have standard labs drawn the day she comes into [place] facility to have AWV on 09/08/18. Explaining she was unable to have the labs drawn this year due to covid-19.

## 2018-08-30 NOTE — Telephone Encounter (Signed)
Done. Thanks.

## 2018-09-01 ENCOUNTER — Ambulatory Visit: Payer: PPO

## 2018-09-05 ENCOUNTER — Telehealth: Payer: Self-pay | Admitting: Internal Medicine

## 2018-09-05 NOTE — Telephone Encounter (Signed)
Copied from [place] [id number]. Topic: General - Other >> Sep 05, 2018 11:19 AM Leward Quan A wrote: Reason for CRM: Patient called to speak to the health coach in regards to her appointment on 09/08/2018. She states that she have been waiting for a call back for days now. Please call patient at Ph# [phone]

## 2018-09-05 NOTE — Progress Notes (Addendum)
Subjective:   Deanna Salazar is a 78 y.o. female who presents for Medicare Annual (Subsequent) preventive examination.  Review of Systems:   Cardiac Risk Factors include: advanced age (>25men, >27 women);diabetes mellitus;dyslipidemia;hypertension Sleep patterns: feels rested on waking, does not get up to void, gets up 1-2 times nightly to void and sleeps 5-6 hours nightly. Patient reports insomnia issues, discussed recommended sleep tips.    Home Safety/Smoke Alarms: Feels safe in home. Smoke alarms in place.  Living environment; residence and Firearm Safety: [place], can live on one level. Lives with daughter, no needs for DME, good support system Seat Belt Safety/Bike Helmet: Wears seat belt.      Objective:     Vitals: BP (!) 144/84   Pulse 63   Resp 17   Ht 5\' 3"  (1.6 m)   Wt 204 lb (92.5 kg)   SpO2 100%   BMI 36.14 kg/m   Body mass index is 36.14 kg/m.  Advanced Directives 09/08/2018 08/23/2017 08/22/2016 08/11/2016 02/28/2016 09/29/2013 09/28/2013  Does Patient Have a Medical Advance Directive? No No No No No No No  Would patient like information on creating a medical advance directive? No - Patient declined Yes (ED - Information included in AVS) Yes (ED - Information included in AVS) No - Patient declined - No - patient declined information No - patient declined information    Tobacco Social History   Tobacco Use  Smoking Status Never Smoker  Smokeless Tobacco Never Used     Counseling given: Not Answered  Past Medical History:  Diagnosis Date  . Abdominal pain, generalized 02/05/2008  . ABDOMINAL PAIN-EPIGASTRIC 03/24/2009  . Acute bronchitis 02/15/2007  . Adenomatous colon polyp 04/2000  . Arthritis   . ASCUS favor benign 10/2011   negative high risk HPV screen  . BACK PAIN 07/20/2008  . Breast cancer ([place]) 2009   l, hx of XRT Dr Benay Spice, Left side  . BREAST CANCER, HX OF 04/16/2007  . CHEST PAIN 02/23/2010  . COLONIC POLYPS, ADENOMATOUS, HX OF 10/01/2007  .  CONSTIPATION, CHRONIC 05/10/2007  . Dysuria 04/06/2008  . FATIGUE 12/26/2006  . Fatty liver   . FATTY LIVER DISEASE 11/19/2006  . FIBROMYALGIA 11/19/2006  . GERD 11/19/2006   Dr Fuller Plan  . Glaucoma 07/2017   left eye  . HEMATOCHEZIA 05/10/2007  . HEMORRHOIDS, INTERNAL 05/10/2007  . HIP PAIN 07/20/2008  . HYPERLIPIDEMIA 07/26/2009  . HYPERTENSION 12/26/2006  . HYPOKALEMIA 11/25/2007  . HYPOTHYROIDISM 12/26/2006  . IBS 11/19/2006  . INSOMNIA, PERSISTENT 12/26/2006  . KELOID 07/25/2007  . MURMUR 07/25/2007  . Nausea alone 03/23/2010  . PARESTHESIA 04/06/2008  . Personal history of radiation therapy   . RENAL CYST, LEFT 02/23/2010  . Rheumatoid arthritis(714.0) 11/19/2006   Dr Vianne Bulls  . RUQ PAIN 11/07/2009  . Tubular adenoma   . 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 BREAST LUMPECTOMY Left 2009  . BREAST SURGERY  2009   Left lumpectomy  . COLONOSCOPY  06/12/2012  . Cyst excision from chest    . knee surgery Left   . Left thyroidectomy    . OS Cataract extraction  2009  . OVARIAN CYST REMOVAL  age 3  . SHOULDER SURGERY     both shoulders  . TOE SURGERY    . TUBAL LIGATION     Family History  Problem Relation Age of Onset  . Lung cancer Brother   . Colon cancer  Brother 59       50's  . Diabetes Father   . Brain cancer Brother   . Stroke Sister   . Breast cancer Neg Hx    Social History   Socioeconomic History  . Marital status: Widowed    Spouse name: Not on file  . Number of children: 5  . Years of education: Not on file  . Highest education level: Not on file  Occupational History  . Occupation: retired    Fish farm manager: RETIRED  Social Needs  . Financial resource strain: Not very hard  . Food insecurity    Worry: Never true    Inability: Never true  . Transportation needs    Medical: No    Non-medical: No  Tobacco Use  . Smoking status: Never Smoker  . Smokeless tobacco: Never Used   Substance and Sexual Activity  . Alcohol use: No    Alcohol/week: 0.0 standard drinks  . Drug use: No  . Sexual activity: Never    Comment: 1st intercourse 78 yo-Fewer than 5 partners  Lifestyle  . Physical activity    Days per week: 0 days    Minutes per session: 0 min  . Stress: Not at all  Relationships  . Social connections    Talks on phone: More than three times a week    Gets together: More than three times a week    Attends religious service: Never    Active member of club or organization: Yes    Attends meetings of clubs or organizations: More than 4 times per year    Relationship status: Widowed  Other Topics Concern  . Not on file  Social History Narrative   Patient does not get regular exercise    Outpatient Encounter Medications as of 09/08/2018  Medication Sig  . acetaminophen (TYLENOL) 500 MG tablet Take 500-1,000 mg by mouth every 6 (six) hours as needed for mild pain, moderate pain, fever or headache.  Marland Kitchen amLODipine (NORVASC) 5 MG tablet TAKE 1 TABLET BY MOUTH EVERY DAY  . aspirin (ASPIRIN CHILDRENS) 81 MG chewable tablet Chew 1 tablet (81 mg total) by mouth daily.  . carvedilol (COREG) 25 MG tablet Take 1 tablet (25 mg total) by mouth 2 (two) times daily with a meal.  . cholecalciferol (VITAMIN D) 1000 units tablet Take 1,000 Units by mouth daily.  . furosemide (LASIX) 20 MG tablet TAKE 1 TABLET(20 MG) BY MOUTH DAILY  . latanoprost (XALATAN) 0.005 % ophthalmic solution INT 1 GTT IN OU Q NIGHT  . levothyroxine (SYNTHROID) 25 MCG tablet TAKE 1 TABLET BY MOUTH ONCE DAILY BEFORE BREAKFAST  . loratadine (CLARITIN) 10 MG tablet Take 1 tablet (10 mg total) by mouth daily. (Patient taking differently: Take 10 mg by mouth daily as needed. )  . meclizine (ANTIVERT) 25 MG tablet TAKE 1 TABLET BY MOUTH THREE TIMES DAILY AS NEEDED FOR DIZZINESS OR NAUSEA  . Multiple Vitamins-Minerals (PRESERVISION AREDS 2+MULTI VIT PO) Take by mouth.  Vladimir Faster Glycol-Propyl Glycol  (SYSTANE OP) Apply to eye.  . Polyethylene Glycol 3350 (MIRALAX PO) Take by mouth.  . potassium chloride (K-DUR) 10 MEQ tablet TAKE 1 TABLET BY MOUTH ONCE DAILY  . temazepam (RESTORIL) 15 MG capsule Take 1 capsule (15 mg total) by mouth at bedtime as needed for sleep.  . TRAVATAN Z 0.004 % SOLN ophthalmic solution INT 1 GTT IN OU HS  . [DISCONTINUED] cyclobenzaprine (FLEXERIL) 5 MG tablet Take 1 tablet (5 mg total) by mouth 3 (three)  times daily as needed for muscle spasms (back pain). (Patient not taking: Reported on 09/08/2018)  . [DISCONTINUED] escitalopram (LEXAPRO) 5 MG tablet Take 1 tablet (5 mg total) by mouth daily. (Patient not taking: Reported on 09/08/2018)  . [DISCONTINUED] fluticasone furoate-vilanterol (BREO ELLIPTA) 100-25 MCG/INH AEPB Inhale 1 puff into the lungs daily. (Patient not taking: Reported on 09/08/2018)  . [DISCONTINUED] linaclotide (LINZESS) 290 MCG CAPS capsule Take 1 capsule (290 mcg total) by mouth daily before breakfast. (Patient not taking: Reported on 09/08/2018)  . [DISCONTINUED] RABEprazole (ACIPHEX) 20 MG tablet Take 1 tablet (20 mg total) by mouth daily. (Patient not taking: Reported on 09/08/2018)   No facility-administered encounter medications on file as of 09/08/2018.     Activities of Daily Living In your present state of health, do you have any difficulty performing the following activities: 09/08/2018  Hearing? N  Vision? N  Difficulty concentrating or making decisions? N  Walking or climbing stairs? N  Dressing or bathing? N  Doing errands, shopping? N  Preparing Food and eating ? N  Using the Toilet? N  In the past six months, have you accidently leaked urine? N  Do you have problems with loss of bowel control? N  Managing your Medications? N  Managing your Finances? N  Housekeeping or managing your Housekeeping? N  Some recent data might be hidden    Patient Care Team: Plotnikov, Evie Lacks, MD as PCP - General Hennie Duos, MD  (Rheumatology) Newt Minion, MD (Orthopedic Surgery)    Assessment:   This is a routine wellness examination for Deanna Salazar. Physical assessment deferred to PCP.  Exercise Activities and Dietary recommendations Current Exercise Habits: The patient does not participate in regular exercise at present, Exercise limited by: orthopedic condition(s)  Diet (meal preparation, eat out, water intake, caffeinated beverages, dairy products, fruits and vegetables): in general, a "healthy" diet  , well balanced   Reviewed heart healthy and diabetic diet. Encouraged patient to increase daily water and healthy fluid intake. Relevant patient education assigned to patient using Emmi.  Goals    . lose weight     Continue to exercise several times weekly, read food labels to decrease carbohydrates and sugar, monitor my portions.  Enjoy life, family and church.    . Patient Stated     Stay as healthy and as independent as possible by eating healthy, stay physically and socially active, love family, God and enjoy life.     . Patient Stated     I want to lose weight by increasing physical activity and monitoring my diet closely for fattening foods.    . Weight < 200 lb (90.719 kg)     Will continue the journey;  Will start to monitor sweets;  And keep exercising Low weight with some toning of muscles as tolerated       Fall Risk Fall Risk  09/08/2018 08/23/2017 08/22/2016 02/09/2016 11/17/2014  Falls in the past year? 0 No No No Yes  Number falls in past yr: 0 - - - 1  Injury with Fall? 0 - - - No  Risk for fall due to : - Impaired balance/gait;Impaired mobility - - -  Follow up - - - - Education provided    Depression Screen PHQ 2/9 Scores 09/08/2018 08/23/2017 08/22/2016 02/09/2016  PHQ - 2 Score 1 0 1 0  PHQ- 9 Score - - 6 -     Cognitive Function MMSE - Mini Mental State Exam 08/23/2017 08/22/2016  Orientation to  time 5 5  Orientation to Place 5 5  Registration 3 3  Attention/ Calculation 5 4   Recall 3 2  Language- name 2 objects 2 2  Language- repeat 1 1  Language- follow 3 step command 3 3  Language- read & follow direction 1 1  Write a sentence 1 1  Copy design 1 1  Total score 30 28     6CIT Screen 09/08/2018  What Year? 0 points  What month? 0 points  What time? 0 points  Count back from 20 0 points  Months in reverse 0 points  Repeat phrase 0 points  Total Score 0    Immunization History  Administered Date(s) Administered  . Fluad Quad(high Dose 65+) 09/08/2018  . Influenza Split 10/30/2010, 01/16/2012  . Influenza Whole 03/03/2009  . Influenza, High Dose Seasonal PF 12/06/2014, 10/04/2015, 11/23/2016, 10/11/2017  . Influenza,inj,Quad PF,6+ Mos 09/24/2012, 01/04/2014  . Pneumococcal Conjugate-13 04/05/2014  . Pneumococcal Polysaccharide-23 12/06/2014  . Td 04/05/2014   Screening Tests Health Maintenance  Topic Date Due  . URINE MICROALBUMIN  08/22/2017  . OPHTHALMOLOGY EXAM  04/09/2018  . INFLUENZA VACCINE  08/09/2018  . FOOT EXAM  08/24/2018  . TETANUS/TDAP  04/04/2024  . DEXA SCAN  Completed  . PNA vac Low Risk Adult  Completed      Plan:    Reviewed health maintenance screenings with patient today and relevant education, vaccines, and/or referrals were provided.   I have personally reviewed and noted the following in the patient's chart:   . Medical and social history . Use of alcohol, tobacco or illicit drugs  . Current medications and supplements . Functional ability and status . Nutritional status . Physical activity . Advanced directives . List of other physicians . Vitals . Screenings to include cognitive, depression, and falls . Referrals and appointments  In addition, I have reviewed and discussed with patient certain preventive protocols, quality metrics, and best practice recommendations. A written personalized care plan for preventive services as well as general preventive health recommendations were provided to patient.      Michiel Cowboy, RN  09/08/2018  Medical screening examination/treatment/procedure(s) were performed by non-physician practitioner and as supervising physician I was immediately available for consultation/collaboration. I agree with above. Lew Dawes, MD

## 2018-09-08 ENCOUNTER — Other Ambulatory Visit: Payer: Self-pay

## 2018-09-08 ENCOUNTER — Other Ambulatory Visit (INDEPENDENT_AMBULATORY_CARE_PROVIDER_SITE_OTHER): Payer: PPO

## 2018-09-08 ENCOUNTER — Ambulatory Visit (INDEPENDENT_AMBULATORY_CARE_PROVIDER_SITE_OTHER): Payer: PPO | Admitting: *Deleted

## 2018-09-08 VITALS — BP 144/84 | HR 63 | Resp 17 | Ht 63.0 in | Wt 204.0 lb

## 2018-09-08 DIAGNOSIS — E119 Type 2 diabetes mellitus without complications: Secondary | ICD-10-CM | POA: Diagnosis not present

## 2018-09-08 DIAGNOSIS — Z23 Encounter for immunization: Secondary | ICD-10-CM

## 2018-09-08 DIAGNOSIS — E785 Hyperlipidemia, unspecified: Secondary | ICD-10-CM | POA: Diagnosis not present

## 2018-09-08 DIAGNOSIS — Z0000 Encounter for general adult medical examination without abnormal findings: Secondary | ICD-10-CM

## 2018-09-08 LAB — CBC WITH DIFFERENTIAL/PLATELET
Basophils Absolute: 0.1 10*3/uL (ref 0.0–0.1)
Basophils Relative: 0.9 % (ref 0.0–3.0)
Eosinophils Absolute: 0.3 10*3/uL (ref 0.0–0.7)
Eosinophils Relative: 3.5 % (ref 0.0–5.0)
HCT: 38 % (ref 36.0–46.0)
Hemoglobin: 12.3 g/dL (ref 12.0–15.0)
Lymphocytes Relative: 25.9 % (ref 12.0–46.0)
Lymphs Abs: 2 10*3/uL (ref 0.7–4.0)
MCHC: 32.4 g/dL (ref 30.0–36.0)
MCV: 90.1 fl (ref 78.0–100.0)
Monocytes Absolute: 0.8 10*3/uL (ref 0.1–1.0)
Monocytes Relative: 10 % (ref 3.0–12.0)
Neutro Abs: 4.5 10*3/uL (ref 1.4–7.7)
Neutrophils Relative %: 59.7 % (ref 43.0–77.0)
Platelets: 205 10*3/uL (ref 150.0–400.0)
RBC: 4.22 Mil/uL (ref 3.87–5.11)
RDW: 13.3 % (ref 11.5–15.5)
WBC: 7.6 10*3/uL (ref 4.0–10.5)

## 2018-09-08 LAB — BASIC METABOLIC PANEL
BUN: 10 mg/dL (ref 6–23)
CO2: 27 mEq/L (ref 19–32)
Calcium: 10.1 mg/dL (ref 8.4–10.5)
Chloride: 103 mEq/L (ref 96–112)
Creatinine, Ser: 0.97 mg/dL (ref 0.40–1.20)
GFR: 67.02 mL/min (ref >60.00)
Glucose, Bld: 103 mg/dL — ABNORMAL HIGH (ref 70–99)
Potassium: 4.2 mEq/L (ref 3.5–5.1)
Sodium: 140 mEq/L (ref 135–145)

## 2018-09-08 LAB — URINALYSIS, ROUTINE W REFLEX MICROSCOPIC
Bilirubin Urine: NEGATIVE
Ketones, ur: NEGATIVE
Nitrite: NEGATIVE
Specific Gravity, Urine: 1.01 (ref 1.000–1.030)
Total Protein, Urine: NEGATIVE
Urine Glucose: NEGATIVE
Urobilinogen, UA: 0.2 (ref 0.0–1.0)
pH: 7 (ref 5.0–8.0)

## 2018-09-08 LAB — LIPID PANEL
Cholesterol: 191 mg/dL (ref 0–200)
HDL: 44.3 mg/dL (ref >39.00)
LDL Cholesterol: 123 mg/dL — ABNORMAL HIGH (ref 0–99)
NonHDL: 146.79
Total CHOL/HDL Ratio: 4
Triglycerides: 120 mg/dL (ref 0.0–149.0)
VLDL: 24 mg/dL (ref 0.0–40.0)

## 2018-09-08 LAB — MICROALBUMIN / CREATININE URINE RATIO
Creatinine,U: 16.6 mg/dL
Microalb Creat Ratio: 10.8 mg/g (ref 0.0–30.0)
Microalb, Ur: 1.8 mg/dL (ref 0.0–1.9)

## 2018-09-08 LAB — TSH: TSH: 3.49 u[IU]/mL (ref 0.35–4.50)

## 2018-09-08 LAB — HEPATIC FUNCTION PANEL
ALT: 13 U/L (ref 0–35)
AST: 21 U/L (ref 0–37)
Albumin: 4.3 g/dL (ref 3.5–5.2)
Alkaline Phosphatase: 95 U/L (ref 39–117)
Bilirubin, Direct: 0.1 mg/dL (ref 0.0–0.3)
Total Bilirubin: 0.5 mg/dL (ref 0.2–1.2)
Total Protein: 7.6 g/dL (ref 6.0–8.3)

## 2018-09-08 LAB — HEMOGLOBIN A1C: Hgb A1c MFr Bld: 6.4 % (ref 4.6–6.5)

## 2018-09-08 NOTE — Patient Instructions (Addendum)
Continue doing brain stimulating activities (puzzles, reading, adult coloring books, staying active) to keep memory sharp.   Continue to eat heart healthy diet (full of fruits, vegetables, whole grains, lean protein, water--limit salt, fat, and sugar intake) and increase physical activity as tolerated.   Deanna Salazar , Thank you for taking time to come for your Medicare Wellness Visit. I appreciate your ongoing commitment to your health goals. Please review the following plan we discussed and let me know if I can assist you in the future.   These are the goals we discussed: Goals    . lose weight     Continue to exercise several times weekly, read food labels to decrease carbohydrates and sugar, monitor my portions.  Enjoy life, family and church.    . Patient Stated     Stay as healthy and as independent as possible by eating healthy, stay physically and socially active, love family, God and enjoy life.     . Patient Stated     I want to lose weight by increasing physical activity and monitoring my diet closely for fattening foods.    . Weight < 200 lb (90.719 kg)     Will continue the journey;  Will start to monitor sweets;  And keep exercising Low weight with some toning of muscles as tolerated       This is a list of the screening recommended for you and due dates:  Health Maintenance  Topic Date Due  . Urine Protein Check  08/22/2017  . Eye exam for diabetics  04/09/2018  . Flu Shot  08/09/2018  . Complete foot exam   08/24/2018  . Tetanus Vaccine  04/04/2024  . DEXA scan (bone density measurement)  Completed  . Pneumonia vaccines  Completed     If you cannot attend class in person, you can still exercise at home. Video taped versions of AHOY classes are shown on [employer] (GTN) at 8 am and 1 pm Mondays through Fridays. You can also purchase a copy of the AHOY DVD by calling [place] (GTN) [employer]. GTN is available  on [employer] channel 13 with a digital cable box and on [employer] channel 31. GTN is also available on [employer], channel 99. To view GTN, go to channel 99, press OK, select [place], then select GTN to start the channel.   The [place] is available Monday through Friday, 9:00 a.m. - 5:00 p.m. Call Center Specialists provide information and referral services to aging adults 65+ in [place]. If there are waiting lists for community services, or if services are not available, [facility] connects clients with [facility] volunteers, or other caring individuals in the community who provide services such as respite care, wheelchair ramp construction, friendly visits, or transportation assistance.  Call Center Specialists consider it an honor and privilege to pray with callers. Contact the Call Center at [phone] or email [email or web address].   Food Choices for Gastroesophageal Reflux Disease, Adult When you have gastroesophageal reflux disease (GERD), the foods you eat and your eating habits are very important. Choosing the right foods can help ease your discomfort. Think about working with a nutrition specialist (dietitian) to help you make good choices. What are tips for following this plan?  Meals  Choose healthy foods that are low in fat, such as fruits, vegetables, whole grains, low-fat dairy products, and lean meat, fish, and poultry.  Eat small meals often instead of 3 large meals a day. Eat your  meals slowly, and in a place where you are relaxed. Avoid bending over or lying down until 2-3 hours after eating.  Avoid eating meals 2-3 hours before bed.  Avoid drinking a lot of liquid with meals.  Cook foods using methods other than frying. Bake, grill, or broil food instead.  Avoid or limit: ? Chocolate. ? Peppermint or spearmint. ? Alcohol. ? Pepper. ? Black and decaffeinated coffee. ? Black and decaffeinated tea. ? Bubbly (carbonated) soft drinks. ? Caffeinated energy  drinks and soft drinks.  Limit high-fat foods such as: ? Fatty meat or fried foods. ? Whole milk, cream, butter, or ice cream. ? Nuts and nut butters. ? Pastries, donuts, and sweets made with butter or shortening.  Avoid foods that cause symptoms. These foods may be different for everyone. Common foods that cause symptoms include: ? Tomatoes. ? Oranges, lemons, and limes. ? Peppers. ? Spicy food. ? Onions and garlic. ? Vinegar. Lifestyle  Maintain a healthy weight. Ask your doctor what weight is healthy for you. If you need to lose weight, work with your doctor to do so safely.  Exercise for at least 30 minutes for 5 or more days each week, or as told by your doctor.  Wear loose-fitting clothes.  Do not smoke. If you need help quitting, ask your doctor.  Sleep with the head of your bed higher than your feet. Use a wedge under the mattress or blocks under the bed frame to raise the head of the bed. Summary  When you have gastroesophageal reflux disease (GERD), food and lifestyle choices are very important in easing your symptoms.  Eat small meals often instead of 3 large meals a day. Eat your meals slowly, and in a place where you are relaxed.  Limit high-fat foods such as fatty meat or fried foods.  Avoid bending over or lying down until 2-3 hours after eating.  Avoid peppermint and spearmint, caffeine, alcohol, and chocolate. This information is not intended to replace advice given to you by your health care provider. Make sure you discuss any questions you have with your health care provider. Document Released: 06/26/2011 Document Revised: 04/17/2018 Document Reviewed: 01/31/2016 Elsevier Patient Education  2020 [employer] 65 Years and Older, Female Preventive care refers to lifestyle choices and visits with your health care provider that can promote health and wellness. This includes:  A yearly physical exam. This is also called an annual well  check.  Regular dental and eye exams.  Immunizations.  Screening for certain conditions.  Healthy lifestyle choices, such as diet and exercise. What can I expect for my preventive care visit? Physical exam Your health care provider will check:  Height and weight. These may be used to calculate body mass index (BMI), which is a measurement that tells if you are at a healthy weight.  Heart rate and blood pressure.  Your skin for abnormal spots. Counseling Your health care provider may ask you questions about:  Alcohol, tobacco, and drug use.  Emotional well-being.  Home and relationship well-being.  Sexual activity.  Eating habits.  History of falls.  Memory and ability to understand (cognition).  Work and work Statistician.  Pregnancy and menstrual history. What immunizations do I need?  Influenza (flu) vaccine  This is recommended every year. Tetanus, diphtheria, and pertussis (Tdap) vaccine  You may need a Td booster every 10 years. Varicella (chickenpox) vaccine  You may need this vaccine if you have not already been vaccinated. Zoster (shingles) vaccine  You may need this after age 28. Pneumococcal conjugate (PCV13) vaccine  One dose is recommended after age 74. Pneumococcal polysaccharide (PPSV23) vaccine  One dose is recommended after age 26. Measles, mumps, and rubella (MMR) vaccine  You may need at least one dose of MMR if you were born in 1957 or later. You may also need a second dose. Meningococcal conjugate (MenACWY) vaccine  You may need this if you have certain conditions. Hepatitis A vaccine  You may need this if you have certain conditions or if you travel or work in places where you may be exposed to hepatitis A. Hepatitis B vaccine  You may need this if you have certain conditions or if you travel or work in places where you may be exposed to hepatitis B. Haemophilus influenzae type b (Hib) vaccine  You may need this if you have  certain conditions. You may receive vaccines as individual doses or as more than one vaccine together in one shot (combination vaccines). Talk with your health care provider about the risks and benefits of combination vaccines. What tests do I need? Blood tests  Lipid and cholesterol levels. These may be checked every 5 years, or more frequently depending on your overall health.  Hepatitis C test.  Hepatitis B test. Screening  Lung cancer screening. You may have this screening every year starting at age 21 if you have a 30-pack-year history of smoking and currently smoke or have quit within the past 15 years.  Colorectal cancer screening. All adults should have this screening starting at age 54 and continuing until age 11. Your health care provider may recommend screening at age 59 if you are at increased risk. You will have tests every 1-10 years, depending on your results and the type of screening test.  Diabetes screening. This is done by checking your blood sugar (glucose) after you have not eaten for a while (fasting). You may have this done every 1-3 years.  Mammogram. This may be done every 1-2 years. Talk with your health care provider about how often you should have regular mammograms.  BRCA-related cancer screening. This may be done if you have a family history of breast, ovarian, tubal, or peritoneal cancers. Other tests  Sexually transmitted disease (STD) testing.  Bone density scan. This is done to screen for osteoporosis. You may have this done starting at age 58. Follow these instructions at home: Eating and drinking  Eat a diet that includes fresh fruits and vegetables, whole grains, lean protein, and low-fat dairy products. Limit your intake of foods with high amounts of sugar, saturated fats, and salt.  Take vitamin and mineral supplements as recommended by your health care provider.  Do not drink alcohol if your health care provider tells you not to drink.  If  you drink alcohol: ? Limit how much you have to 0-1 drink a day. ? Be aware of how much alcohol is in your drink. In the [place] one drink equals one 12 oz bottle of beer (355 mL), one 5 oz glass of wine (148 mL), or one 1 oz glass of hard liquor (44 mL). Lifestyle  Take daily care of your teeth and gums.  Stay active. Exercise for at least 30 minutes on 5 or more days each week.  Do not use any products that contain nicotine or tobacco, such as cigarettes, e-cigarettes, and chewing tobacco. If you need help quitting, ask your health care provider.  If you are sexually active, practice safe sex. Use a  condom or other form of protection in order to prevent STIs (sexually transmitted infections).  Talk with your health care provider about taking a low-dose aspirin or statin. What's next?  Go to your health care provider once a year for a well check visit.  Ask your health care provider how often you should have your eyes and teeth checked.  Stay up to date on all vaccines. This information is not intended to replace advice given to you by your health care provider. Make sure you discuss any questions you have with your health care provider. Document Released: 01/21/2015 Document Revised: 12/19/2017 Document Reviewed: 12/19/2017 Elsevier Patient Education  2020 [employer].

## 2018-09-11 ENCOUNTER — Telehealth: Payer: Self-pay | Admitting: *Deleted

## 2018-09-11 NOTE — Telephone Encounter (Signed)
Copied from [place] [id number]. Topic: Quick Communication - Lab Results (Clinic Use ONLY) >> Sep 11, 2018 10:17 AM Scherrie Gerlach wrote: pt calling back for lab results. Pt states she usually hears before now.

## 2018-09-12 NOTE — Telephone Encounter (Signed)
Pt informed of below.  

## 2018-09-12 NOTE — Telephone Encounter (Signed)
I'm sorry! All labs are nl, except for a little elevated lipids and glucose. Thx

## 2018-10-04 ENCOUNTER — Other Ambulatory Visit: Payer: Self-pay | Admitting: Internal Medicine

## 2018-10-09 ENCOUNTER — Other Ambulatory Visit: Payer: Self-pay

## 2018-10-09 ENCOUNTER — Encounter: Payer: Self-pay | Admitting: Internal Medicine

## 2018-10-09 ENCOUNTER — Ambulatory Visit (INDEPENDENT_AMBULATORY_CARE_PROVIDER_SITE_OTHER): Payer: PPO | Admitting: Internal Medicine

## 2018-10-09 DIAGNOSIS — R05 Cough: Secondary | ICD-10-CM | POA: Diagnosis not present

## 2018-10-09 DIAGNOSIS — E119 Type 2 diabetes mellitus without complications: Secondary | ICD-10-CM

## 2018-10-09 DIAGNOSIS — Z20828 Contact with and (suspected) exposure to other viral communicable diseases: Secondary | ICD-10-CM | POA: Diagnosis not present

## 2018-10-09 DIAGNOSIS — R059 Cough, unspecified: Secondary | ICD-10-CM

## 2018-10-09 DIAGNOSIS — Z20822 Contact with and (suspected) exposure to covid-19: Secondary | ICD-10-CM

## 2018-10-09 MED ORDER — HYDROCODONE-HOMATROPINE 5-1.5 MG/5ML PO SYRP: 5.0000 mL | ORAL_SOLUTION | Freq: Four times a day (QID) | ORAL | 0 refills | Status: DC | PRN

## 2018-10-09 MED ORDER — AZITHROMYCIN 250 MG PO TABS: ORAL_TABLET | ORAL | 1 refills | Status: DC

## 2018-10-09 NOTE — Assessment & Plan Note (Signed)
For covid testing

## 2018-10-09 NOTE — Patient Instructions (Signed)
Please take all new medication as prescribed - the antibiotic and cough medicine  Please go to the [facility] site for COVID testing  Please continue all other medications as before, and refills have been done if requested.  Please have the pharmacy call with any other refills you may need.  Please continue your efforts at being more active, low cholesterol diet, and weight control.  Please keep your appointments with your specialists as you may have planned

## 2018-10-09 NOTE — Assessment & Plan Note (Signed)
stable overall by history and exam, recent data reviewed with pt, and pt to continue medical treatment as before,  to f/u any worsening symptoms or concerns  

## 2018-10-09 NOTE — Assessment & Plan Note (Signed)
Mild to mod, c/w bronchitis vs pna, possible viral vs bacterial, for empiric antibx course, cough med prn, refer COVID testing,and  to f/u any worsening symptoms or concerns

## 2018-10-09 NOTE — Progress Notes (Deleted)
   Subjective:    Patient ID: Deanna Salazar, female    DOB: 07-17-1940, 78 y.o.   MRN: [id number]  HPI    Review of Systems     Objective:   Physical Exam        Assessment & Plan:

## 2018-10-09 NOTE — Progress Notes (Signed)
Patient ID: Deanna Salazar, female   DOB: June 11, 1940, 78 y.o.   MRN: [id number]  Virtual Visit via Video Note  I connected with Deanna Salazar on 10/09/18 at 10:00 AM EDT by a video enabled telemedicine application and verified that I am speaking with the correct person using two identifiers.  Location: Patient: at home Provider: at office   I discussed the limitations of evaluation and management by telemedicine and the availability of in person appointments. The patient expressed understanding and agreed to proceed.  History of Present Illness: Here with acute onset mild to mod 2-3 days ST, HA, general weakness and malaise, with prod cough greenish and sometimes clear sputum, but Pt denies chest pain, increased sob or doe, wheezing, orthopnea, PND, increased LE swelling, palpitations, dizziness or syncope.  Pt states a friend was over to visit 3 days ago then had URI symptoms and herself was COVID tested today with results pending.  Pt denies new neurological symptoms such as new headache, or facial or extremity weakness or numbness   Pt denies polydipsia, polyuria Past Medical History:  Diagnosis Date  . Abdominal pain, generalized 02/05/2008  . ABDOMINAL PAIN-EPIGASTRIC 03/24/2009  . Acute bronchitis 02/15/2007  . Adenomatous colon polyp 04/2000  . Arthritis   . ASCUS favor benign 10/2011   negative high risk HPV screen  . BACK PAIN 07/20/2008  . Breast cancer ([facility]) 2009   l, hx of XRT Dr Benay Spice, Left side  . BREAST CANCER, HX OF 04/16/2007  . CHEST PAIN 02/23/2010  . COLONIC POLYPS, ADENOMATOUS, HX OF 10/01/2007  . CONSTIPATION, CHRONIC 05/10/2007  . Dysuria 04/06/2008  . FATIGUE 12/26/2006  . Fatty liver   . FATTY LIVER DISEASE 11/19/2006  . FIBROMYALGIA 11/19/2006  . GERD 11/19/2006   Dr Fuller Plan  . Glaucoma 07/2017   left eye  . HEMATOCHEZIA 05/10/2007  . HEMORRHOIDS, INTERNAL 05/10/2007  . HIP PAIN 07/20/2008  . HYPERLIPIDEMIA 07/26/2009  . HYPERTENSION 12/26/2006  . HYPOKALEMIA  11/25/2007  . HYPOTHYROIDISM 12/26/2006  . IBS 11/19/2006  . INSOMNIA, PERSISTENT 12/26/2006  . KELOID 07/25/2007  . MURMUR 07/25/2007  . Nausea alone 03/23/2010  . PARESTHESIA 04/06/2008  . Personal history of radiation therapy   . RENAL CYST, LEFT 02/23/2010  . Rheumatoid arthritis(714.0) 11/19/2006   Dr Vianne Bulls  . RUQ PAIN 11/07/2009  . Tubular adenoma   . 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 BREAST LUMPECTOMY Left 2009  . BREAST SURGERY  2009   Left lumpectomy  . COLONOSCOPY  06/12/2012  . Cyst excision from chest    . knee surgery Left   . Left thyroidectomy    . OS Cataract extraction  2009  . OVARIAN CYST REMOVAL  age 69  . SHOULDER SURGERY     both shoulders  . TOE SURGERY    . TUBAL LIGATION      reports that she has never smoked. She has never used smokeless tobacco. She reports that she does not drink alcohol or use drugs. family history includes Brain cancer in her brother; Colon cancer (age of onset: 68) in her brother; Diabetes in her father; Lung cancer in her brother; Stroke in her sister. Allergies  Allergen Reactions  . Codeine Nausea And Vomiting and Other (See Comments)    Pt states that she is able to take on a short term basis.    Marland Kitchen Hydrocodone Nausea And Vomiting and Other (See Comments)  Pt states that she is able to take on a short term basis.    . Lovastatin Itching and Other (See Comments)    Reaction:  Leg cramps   . Pantoprazole     ?abd pain  . Pepcid [Famotidine]     ?abd pain  . Telmisartan     The patient thinks that telmisartan is making her sneeze  . Tramadol Hcl Itching   Current Outpatient Medications on File Prior to Visit  Medication Sig Dispense Refill  . acetaminophen (TYLENOL) 500 MG tablet Take 500-1,000 mg by mouth every 6 (six) hours as needed for mild pain, moderate pain, fever or headache.    Marland Kitchen amLODipine (NORVASC) 5 MG tablet TAKE 1 TABLET BY MOUTH  EVERY DAY 90 tablet 2  . aspirin (ASPIRIN CHILDRENS) 81 MG chewable tablet Chew 1 tablet (81 mg total) by mouth daily. 36 tablet 11  . carvedilol (COREG) 25 MG tablet Take 1 tablet (25 mg total) by mouth 2 (two) times daily with a meal. 180 tablet 3  . cholecalciferol (VITAMIN D) 1000 units tablet Take 1,000 Units by mouth daily.    . furosemide (LASIX) 20 MG tablet TAKE 1 TABLET(20 MG) BY MOUTH DAILY 90 tablet 3  . latanoprost (XALATAN) 0.005 % ophthalmic solution INT 1 GTT IN OU Q NIGHT  3  . levothyroxine (SYNTHROID) 25 MCG tablet TAKE 1 TABLET BY MOUTH ONCE DAILY BEFORE BREAKFAST 90 tablet 3  . loratadine (CLARITIN) 10 MG tablet TAKE 1 TABLET BY MOUTH DAILY 100 tablet 3  . meclizine (ANTIVERT) 25 MG tablet TAKE 1 TABLET BY MOUTH THREE TIMES DAILY AS NEEDED FOR DIZZINESS OR NAUSEA 30 tablet 3  . Multiple Vitamins-Minerals (PRESERVISION AREDS 2+MULTI VIT PO) Take by mouth.    Vladimir Faster Glycol-Propyl Glycol (SYSTANE OP) Apply to eye.    . Polyethylene Glycol 3350 (MIRALAX PO) Take by mouth.    . potassium chloride (K-DUR) 10 MEQ tablet TAKE 1 TABLET BY MOUTH ONCE DAILY 90 tablet 3  . temazepam (RESTORIL) 15 MG capsule Take 1 capsule (15 mg total) by mouth at bedtime as needed for sleep. 30 capsule 5  . TRAVATAN Z 0.004 % SOLN ophthalmic solution INT 1 GTT IN OU HS  11   No current facility-administered medications on file prior to visit.     Observations/Objective: Alert, NAD, appropriate mood and affect, resps normal, cn 2-12 intact, moves all 4s, no visible rash or swelling Lab Results  Component Value Date   WBC 7.6 09/08/2018   HGB 12.3 09/08/2018   HCT 38.0 09/08/2018   PLT 205.0 09/08/2018   GLUCOSE 103 (H) 09/08/2018   CHOL 191 09/08/2018   TRIG 120.0 09/08/2018   HDL 44.30 09/08/2018   LDLDIRECT 180.9 07/26/2009   LDLCALC 123 (H) 09/08/2018   ALT 13 09/08/2018   AST 21 09/08/2018   NA 140 09/08/2018   K 4.2 09/08/2018   CL 103 09/08/2018   CREATININE 0.97 09/08/2018    BUN 10 09/08/2018   CO2 27 09/08/2018   TSH 3.49 09/08/2018   HGBA1C 6.4 09/08/2018   MICROALBUR 1.8 09/08/2018   Assessment and Plan: See notes  Follow Up Instructions: See notes   I discussed the assessment and treatment plan with the patient. The patient was provided an opportunity to ask questions and all were answered. The patient agreed with the plan and demonstrated an understanding of the instructions.   The patient was advised to call back or seek an in-person evaluation if the  symptoms worsen or if the condition fails to improve as anticipated.   Cathlean Cower, MD

## 2018-10-10 ENCOUNTER — Telehealth: Payer: Self-pay

## 2018-10-10 LAB — NOVEL CORONAVIRUS, NAA: SARS-CoV-2, NAA: NOT DETECTED

## 2018-10-10 NOTE — Telephone Encounter (Signed)
-----   Message from Biagio Borg, MD sent at 10/10/2018  1:02 PM EDT ----- Left message on MyChart, pt to cont same tx  Tyona Nilsen to please inform pt, COVID is neg

## 2018-10-10 NOTE — Telephone Encounter (Signed)
Pt has viewed results via MyChart  

## 2018-10-13 ENCOUNTER — Telehealth: Payer: Self-pay | Admitting: Internal Medicine

## 2018-10-13 NOTE — Telephone Encounter (Signed)
Pt called in to be advised. Pt says that she was seen and prescribed HYDROcodone-homatropine (HYCODAN) 5-1.5 MG/5ML syrup, pt would like to know if provider would like for her to take refill? Pt says that she is feeling a little better but is not back to her normal self.  Please advise.   CB: [phone]

## 2018-10-14 NOTE — Telephone Encounter (Signed)
Please advise 

## 2018-10-14 NOTE — Telephone Encounter (Signed)
Waiting on MD to response.

## 2018-10-14 NOTE — Telephone Encounter (Signed)
Pt calling because no one has called her concerning previous message about her refilling her medication

## 2018-10-15 MED ORDER — HYDROCODONE-HOMATROPINE 5-1.5 MG/5ML PO SYRP: 5.0000 mL | ORAL_SOLUTION | Freq: Four times a day (QID) | ORAL | 0 refills | Status: AC | PRN

## 2018-10-15 NOTE — Telephone Encounter (Signed)
Ok Will refill Thx

## 2018-10-15 NOTE — Telephone Encounter (Signed)
Pt informed of below.  

## 2018-10-16 ENCOUNTER — Encounter: Payer: Self-pay | Admitting: Gynecology

## 2018-11-04 ENCOUNTER — Telehealth: Payer: Self-pay | Admitting: *Deleted

## 2018-11-04 NOTE — Telephone Encounter (Signed)
Copied from [place] [id number]. Topic: General - Inquiry >> Nov 04, 2018 10:38 AM Mathis Bud wrote: Reason for CRM: patient is stating her arthritis in her ankle and hand is getting worse.  Patient has seen an arthritis doctor in the past but patient is wondering if PCP would like to see her first or if she can get another referral to an arthritis doctors Patient call back [phone]

## 2018-11-05 NOTE — Telephone Encounter (Signed)
I'll be happy to see her Thx

## 2018-11-07 ENCOUNTER — Telehealth: Payer: Self-pay | Admitting: Internal Medicine

## 2018-11-07 NOTE — Telephone Encounter (Signed)
Pt called stating the arthritis medication and tylenol is not helping. Pt is requesting to have something stronger sent in as she cant sleep due to the pain. Pt is requesting to have that sent in today is possible. Please advise.     [facility] [phone] - [address], [address]  [place] [address]  Phone: [phone] Fax: [phone]  Not a 24 hour pharmacy; exact hours not known.

## 2018-11-07 NOTE — Telephone Encounter (Signed)
Pt needs OV to get any medication changes, please call to schedule

## 2018-11-07 NOTE — Telephone Encounter (Signed)
Appointment scheduled.

## 2018-11-10 ENCOUNTER — Encounter: Payer: Self-pay | Admitting: Internal Medicine

## 2018-11-10 ENCOUNTER — Other Ambulatory Visit (INDEPENDENT_AMBULATORY_CARE_PROVIDER_SITE_OTHER): Payer: PPO

## 2018-11-10 ENCOUNTER — Ambulatory Visit (INDEPENDENT_AMBULATORY_CARE_PROVIDER_SITE_OTHER): Payer: PPO | Admitting: Internal Medicine

## 2018-11-10 ENCOUNTER — Other Ambulatory Visit: Payer: Self-pay

## 2018-11-10 DIAGNOSIS — M06032 Rheumatoid arthritis without rheumatoid factor, left wrist: Secondary | ICD-10-CM

## 2018-11-10 DIAGNOSIS — E876 Hypokalemia: Secondary | ICD-10-CM | POA: Diagnosis not present

## 2018-11-10 DIAGNOSIS — M06031 Rheumatoid arthritis without rheumatoid factor, right wrist: Secondary | ICD-10-CM

## 2018-11-10 DIAGNOSIS — E119 Type 2 diabetes mellitus without complications: Secondary | ICD-10-CM

## 2018-11-10 LAB — CBC WITH DIFFERENTIAL/PLATELET
Basophils Absolute: 0.1 10*3/uL (ref 0.0–0.1)
Basophils Relative: 0.9 % (ref 0.0–3.0)
Eosinophils Absolute: 0.3 10*3/uL (ref 0.0–0.7)
Eosinophils Relative: 3.9 % (ref 0.0–5.0)
HCT: 34.8 % — ABNORMAL LOW (ref 36.0–46.0)
Hemoglobin: 11.5 g/dL — ABNORMAL LOW (ref 12.0–15.0)
Lymphocytes Relative: 33.6 % (ref 12.0–46.0)
Lymphs Abs: 2.2 10*3/uL (ref 0.7–4.0)
MCHC: 33 g/dL (ref 30.0–36.0)
MCV: 89.5 fl (ref 78.0–100.0)
Monocytes Absolute: 0.8 10*3/uL (ref 0.1–1.0)
Monocytes Relative: 11.7 % (ref 3.0–12.0)
Neutro Abs: 3.3 10*3/uL (ref 1.4–7.7)
Neutrophils Relative %: 49.9 % (ref 43.0–77.0)
Platelets: 221 10*3/uL (ref 150.0–400.0)
RBC: 3.89 Mil/uL (ref 3.87–5.11)
RDW: 13.9 % (ref 11.5–15.5)
WBC: 6.5 10*3/uL (ref 4.0–10.5)

## 2018-11-10 LAB — BASIC METABOLIC PANEL
BUN: 12 mg/dL (ref 6–23)
CO2: 27 mEq/L (ref 19–32)
Calcium: 10.1 mg/dL (ref 8.4–10.5)
Chloride: 104 mEq/L (ref 96–112)
Creatinine, Ser: 0.86 mg/dL (ref 0.40–1.20)
GFR: 76.98 mL/min (ref >60.00)
Glucose, Bld: 83 mg/dL (ref 70–99)
Potassium: 3.7 mEq/L (ref 3.5–5.1)
Sodium: 139 mEq/L (ref 135–145)

## 2018-11-10 LAB — TSH: TSH: 2.02 u[IU]/mL (ref 0.35–4.50)

## 2018-11-10 LAB — HEPATIC FUNCTION PANEL
ALT: 14 U/L (ref 0–35)
AST: 23 U/L (ref 0–37)
Albumin: 4.2 g/dL (ref 3.5–5.2)
Alkaline Phosphatase: 90 U/L (ref 39–117)
Bilirubin, Direct: 0.1 mg/dL (ref 0.0–0.3)
Total Bilirubin: 0.3 mg/dL (ref 0.2–1.2)
Total Protein: 7.3 g/dL (ref 6.0–8.3)

## 2018-11-10 LAB — HEMOGLOBIN A1C: Hgb A1c MFr Bld: 6.1 % (ref 4.6–6.5)

## 2018-11-10 MED ORDER — PREDNISONE 10 MG PO TABS: ORAL_TABLET | ORAL | 1 refills | Status: DC

## 2018-11-10 MED ORDER — METFORMIN HCL 500 MG PO TABS: 500.0000 mg | ORAL_TABLET | Freq: Every day | ORAL | 3 refills | Status: DC

## 2018-11-10 NOTE — Progress Notes (Signed)
Subjective:  Patient ID: Deanna Salazar, female    DOB: 18-Mar-1940  Age: 78 y.o. MRN: [id number]  CC: No chief complaint on file.   HPI [employer] presents for severe arthritis - worse in the R hand, B ankles x 2-3 wks. Tylenol did not work. Can't sleep. F/u DM, HTN  Outpatient Medications Prior to Visit  Medication Sig Dispense Refill  . acetaminophen (TYLENOL) 500 MG tablet Take 500-1,000 mg by mouth every 6 (six) hours as needed for mild pain, moderate pain, fever or headache.    Marland Kitchen amLODipine (NORVASC) 5 MG tablet TAKE 1 TABLET BY MOUTH EVERY DAY 90 tablet 2  . aspirin (ASPIRIN CHILDRENS) 81 MG chewable tablet Chew 1 tablet (81 mg total) by mouth daily. 36 tablet 11  . azithromycin (ZITHROMAX Z-PAK) 250 MG tablet 2 tab by mouth day 1, then 1 per day 6 tablet 1  . carvedilol (COREG) 25 MG tablet Take 1 tablet (25 mg total) by mouth 2 (two) times daily with a meal. 180 tablet 3  . cholecalciferol (VITAMIN D) 1000 units tablet Take 1,000 Units by mouth daily.    . furosemide (LASIX) 20 MG tablet TAKE 1 TABLET(20 MG) BY MOUTH DAILY 90 tablet 3  . latanoprost (XALATAN) 0.005 % ophthalmic solution INT 1 GTT IN OU Q NIGHT  3  . levothyroxine (SYNTHROID) 25 MCG tablet TAKE 1 TABLET BY MOUTH ONCE DAILY BEFORE BREAKFAST 90 tablet 3  . loratadine (CLARITIN) 10 MG tablet TAKE 1 TABLET BY MOUTH DAILY 100 tablet 3  . meclizine (ANTIVERT) 25 MG tablet TAKE 1 TABLET BY MOUTH THREE TIMES DAILY AS NEEDED FOR DIZZINESS OR NAUSEA 30 tablet 3  . Multiple Vitamins-Minerals (PRESERVISION AREDS 2+MULTI VIT PO) Take by mouth.    Vladimir Faster Glycol-Propyl Glycol (SYSTANE OP) Apply to eye.    . Polyethylene Glycol 3350 (MIRALAX PO) Take by mouth.    . potassium chloride (K-DUR) 10 MEQ tablet TAKE 1 TABLET BY MOUTH ONCE DAILY 90 tablet 3  . temazepam (RESTORIL) 15 MG capsule Take 1 capsule (15 mg total) by mouth at bedtime as needed for sleep. 30 capsule 5  . TRAVATAN Z 0.004 % SOLN ophthalmic solution INT  1 GTT IN OU HS  11   No facility-administered medications prior to visit.     ROS: Review of Systems  Constitutional: Positive for fatigue. Negative for activity change, appetite change, chills and unexpected weight change.  HENT: Negative for congestion, mouth sores and sinus pressure.   Eyes: Negative for visual disturbance.  Respiratory: Negative for cough and chest tightness.   Gastrointestinal: Negative for abdominal pain and nausea.  Genitourinary: Negative for difficulty urinating, frequency and vaginal pain.  Musculoskeletal: Positive for arthralgias, back pain and neck stiffness. Negative for gait problem and myalgias.  Skin: Negative for pallor and rash.  Neurological: Negative for dizziness, tremors, weakness, numbness and headaches.  Psychiatric/Behavioral: Negative for confusion and sleep disturbance.    Objective:  BP (!) 150/76   Pulse 62   Temp 98 F (36.7 C) (Oral)   Ht 5\' 3"  (1.6 m)   Wt 203 lb (92.1 kg)   SpO2 98%   BMI 35.96 kg/m   BP Readings from Last 3 Encounters:  11/10/18 (!) 150/76  09/08/18 (!) 144/84  11/28/17 132/82    Wt Readings from Last 3 Encounters:  11/10/18 203 lb (92.1 kg)  09/08/18 204 lb (92.5 kg)  11/28/17 204 lb (92.5 kg)    Physical Exam Constitutional:  General: She is not in acute distress.    Appearance: She is well-developed.  HENT:     Head: Normocephalic.     Right Ear: External ear normal.     Left Ear: External ear normal.     Nose: Nose normal.  Eyes:     General:        Right eye: No discharge.        Left eye: No discharge.     Conjunctiva/sclera: Conjunctivae normal.     Pupils: Pupils are equal, round, and reactive to light.  Neck:     Musculoskeletal: Normal range of motion and neck supple.     Thyroid: No thyromegaly.     Vascular: No JVD.     Trachea: No tracheal deviation.  Cardiovascular:     Rate and Rhythm: Normal rate and regular rhythm.     Heart sounds: Normal heart sounds.   Pulmonary:     Effort: No respiratory distress.     Breath sounds: No stridor. No wheezing.  Abdominal:     General: Bowel sounds are normal. There is no distension.     Palpations: Abdomen is soft. There is no mass.     Tenderness: There is no abdominal tenderness. There is no guarding or rebound.  Musculoskeletal:        General: Swelling and tenderness present.  Lymphadenopathy:     Cervical: No cervical adenopathy.  Skin:    Findings: No erythema or rash.  Neurological:     Cranial Nerves: No cranial nerve deficit.     Motor: No abnormal muscle tone.     Coordination: Coordination normal.     Deep Tendon Reflexes: Reflexes normal.  Psychiatric:        Behavior: Behavior normal.        Thought Content: Thought content normal.        Judgment: Judgment normal.   R hand is swollen Ankles tender B  Lab Results  Component Value Date   WBC 7.6 09/08/2018   HGB 12.3 09/08/2018   HCT 38.0 09/08/2018   PLT 205.0 09/08/2018   GLUCOSE 103 (H) 09/08/2018   CHOL 191 09/08/2018   TRIG 120.0 09/08/2018   HDL 44.30 09/08/2018   LDLDIRECT 180.9 07/26/2009   LDLCALC 123 (H) 09/08/2018   ALT 13 09/08/2018   AST 21 09/08/2018   NA 140 09/08/2018   K 4.2 09/08/2018   CL 103 09/08/2018   CREATININE 0.97 09/08/2018   BUN 10 09/08/2018   CO2 27 09/08/2018   TSH 3.49 09/08/2018   HGBA1C 6.4 09/08/2018   MICROALBUR 1.8 09/08/2018    Mm Diag Breast Tomo Bilateral  Result Date: 02/07/2018 CLINICAL DATA:  78 year old female status post malignant left lumpectomy in 2009 with diffuse lateral left breast pain for several years. EXAM: DIGITAL DIAGNOSTIC BILATERAL MAMMOGRAM WITH CAD AND TOMO COMPARISON:  Previous exam(s). ACR Breast Density Category b: There are scattered areas of fibroglandular density. FINDINGS: Stable post lumpectomy changes are noted in the upper outer left breast. No new or suspicious mammographic findings are identified in either breast. The parenchymal pattern is  stable. Mammographic images were processed with CAD. IMPRESSION: 1. No mammographic evidence of malignancy in either breast. 2. Stable left breast posttreatment changes. RECOMMENDATION: 1. Clinical follow-up recommended for the diffusely painful area of concern in the left breast. Any further workup should be based on clinical grounds. 2.  Screening mammogram in one year.(Code:SM-B-01Y) I have discussed the findings and recommendations with the patient. Results  were also provided in writing at the conclusion of the visit. If applicable, a reminder letter will be sent to the patient regarding the next appointment. BI-RADS CATEGORY  2: Benign. Electronically Signed   By: Kristopher Oppenheim M.D.   On: 02/07/2018 10:01    Assessment & Plan:   There are no diagnoses linked to this encounter.   No orders of the defined types were placed in this encounter.    Follow-up: No follow-ups on file.  Walker Kehr, MD

## 2018-11-10 NOTE — Assessment & Plan Note (Signed)
Sch OV w/Dr Amil Amen  Prednisone 10 mg: take 4 tabs a day x 3 days; then 3 tabs a day x 4 days; then 2 tabs a day x 4 days, then 1 tab a day

## 2018-11-10 NOTE — Assessment & Plan Note (Signed)
Metformin 11/20 (on steroids)

## 2018-11-10 NOTE — Assessment & Plan Note (Signed)
labs

## 2018-11-11 ENCOUNTER — Other Ambulatory Visit: Payer: Self-pay | Admitting: Internal Medicine

## 2018-11-11 LAB — RHEUMATOID FACTOR: Rheumatoid fact SerPl-aCnc: 102 IU/mL — ABNORMAL HIGH (ref <14)

## 2018-11-17 ENCOUNTER — Ambulatory Visit: Payer: PPO | Admitting: Internal Medicine

## 2018-11-19 DIAGNOSIS — H02831 Dermatochalasis of right upper eyelid: Secondary | ICD-10-CM | POA: Diagnosis not present

## 2018-11-19 DIAGNOSIS — H0288A Meibomian gland dysfunction right eye, upper and lower eyelids: Secondary | ICD-10-CM | POA: Diagnosis not present

## 2018-11-19 DIAGNOSIS — H1045 Other chronic allergic conjunctivitis: Secondary | ICD-10-CM | POA: Diagnosis not present

## 2018-11-19 DIAGNOSIS — H0288B Meibomian gland dysfunction left eye, upper and lower eyelids: Secondary | ICD-10-CM | POA: Diagnosis not present

## 2018-11-19 DIAGNOSIS — H11153 Pinguecula, bilateral: Secondary | ICD-10-CM | POA: Diagnosis not present

## 2018-11-19 DIAGNOSIS — H40011 Open angle with borderline findings, low risk, right eye: Secondary | ICD-10-CM | POA: Diagnosis not present

## 2018-11-19 DIAGNOSIS — H18413 Arcus senilis, bilateral: Secondary | ICD-10-CM | POA: Diagnosis not present

## 2018-11-19 DIAGNOSIS — H401122 Primary open-angle glaucoma, left eye, moderate stage: Secondary | ICD-10-CM | POA: Diagnosis not present

## 2018-11-19 DIAGNOSIS — H20012 Primary iridocyclitis, left eye: Secondary | ICD-10-CM | POA: Diagnosis not present

## 2018-11-19 DIAGNOSIS — H04123 Dry eye syndrome of bilateral lacrimal glands: Secondary | ICD-10-CM | POA: Diagnosis not present

## 2018-11-19 DIAGNOSIS — H02834 Dermatochalasis of left upper eyelid: Secondary | ICD-10-CM | POA: Diagnosis not present

## 2018-11-19 DIAGNOSIS — H21233 Degeneration of iris (pigmentary), bilateral: Secondary | ICD-10-CM | POA: Diagnosis not present

## 2018-11-20 DIAGNOSIS — Z6835 Body mass index (BMI) 35.0-35.9, adult: Secondary | ICD-10-CM | POA: Diagnosis not present

## 2018-11-20 DIAGNOSIS — M0609 Rheumatoid arthritis without rheumatoid factor, multiple sites: Secondary | ICD-10-CM | POA: Diagnosis not present

## 2018-11-20 DIAGNOSIS — E669 Obesity, unspecified: Secondary | ICD-10-CM | POA: Diagnosis not present

## 2018-11-20 DIAGNOSIS — M2550 Pain in unspecified joint: Secondary | ICD-10-CM | POA: Diagnosis not present

## 2018-11-20 DIAGNOSIS — E119 Type 2 diabetes mellitus without complications: Secondary | ICD-10-CM | POA: Diagnosis not present

## 2018-12-08 ENCOUNTER — Telehealth: Payer: Self-pay | Admitting: Internal Medicine

## 2018-12-08 NOTE — Telephone Encounter (Signed)
error:[id number] ° °

## 2018-12-09 ENCOUNTER — Ambulatory Visit (INDEPENDENT_AMBULATORY_CARE_PROVIDER_SITE_OTHER): Payer: PPO | Admitting: Internal Medicine

## 2018-12-09 ENCOUNTER — Encounter: Payer: Self-pay | Admitting: Internal Medicine

## 2018-12-09 DIAGNOSIS — R252 Cramp and spasm: Secondary | ICD-10-CM | POA: Diagnosis not present

## 2018-12-09 NOTE — Assessment & Plan Note (Signed)
C/o severe leg cramps from KCl pills Furosemide and MTX don't seem to bother Hold KCl x 2 weeks Observe for sx's change

## 2018-12-09 NOTE — Progress Notes (Signed)
Virtual Visit via Telephone Note  I connected with Deanna Salazar on 12/09/18 at  9:30 AM EST by telephone and verified that I am speaking with the correct person using two identifiers.   I discussed the limitations, risks, security and privacy concerns of performing an evaluation and management service by telephone and the availability of in person appointments. I also discussed with the patient that there may be a patient responsible charge related to this service. The patient expressed understanding and agreed to proceed.   History of Present Illness:  C/o severe leg cramps from new KCl pills x days Furosemide and MTX don't seem to bother  Observations/Objective:  The pt sounds normal - NAD Assessment and Plan:  See Plan Follow Up Instructions:    I discussed the assessment and treatment plan with the patient. The patient was provided an opportunity to ask questions and all were answered. The patient agreed with the plan and demonstrated an understanding of the instructions.   The patient was advised to call back or seek an in-person evaluation if the symptoms worsen or if the condition fails to improve as anticipated.  I provided 11 minutes of non-face-to-face time during this encounter.   Walker Kehr, MD

## 2018-12-18 ENCOUNTER — Ambulatory Visit: Payer: PPO | Admitting: Internal Medicine

## 2018-12-22 ENCOUNTER — Ambulatory Visit: Payer: PPO | Admitting: Internal Medicine

## 2019-01-13 DIAGNOSIS — E119 Type 2 diabetes mellitus without complications: Secondary | ICD-10-CM | POA: Diagnosis not present

## 2019-01-13 DIAGNOSIS — E669 Obesity, unspecified: Secondary | ICD-10-CM | POA: Diagnosis not present

## 2019-01-13 DIAGNOSIS — M2550 Pain in unspecified joint: Secondary | ICD-10-CM | POA: Diagnosis not present

## 2019-01-13 DIAGNOSIS — Z6836 Body mass index (BMI) 36.0-36.9, adult: Secondary | ICD-10-CM | POA: Diagnosis not present

## 2019-01-13 DIAGNOSIS — M0609 Rheumatoid arthritis without rheumatoid factor, multiple sites: Secondary | ICD-10-CM | POA: Diagnosis not present

## 2019-01-30 ENCOUNTER — Telehealth: Payer: Self-pay | Admitting: *Deleted

## 2019-01-30 ENCOUNTER — Telehealth: Payer: Self-pay | Admitting: Internal Medicine

## 2019-01-30 NOTE — Telephone Encounter (Signed)
She can take metformin 500 mg twice a day while on prednisone.  Then go back to 500 mg once a day.  Thank you

## 2019-01-30 NOTE — Telephone Encounter (Signed)
Pt informed of below.  

## 2019-01-30 NOTE — Telephone Encounter (Signed)
Patient states she had labs drawn at [facility] about 3 weeks ago orders by Dr. Amil Amen. I called the office to request labs per patient's request. Office was closed today. Will need to call back Monday. Office # is [phone]

## 2019-01-30 NOTE — Telephone Encounter (Signed)
Please advise 

## 2019-01-30 NOTE — Telephone Encounter (Signed)
   Patient wants to know if she needs increase dosage of metFORMIN (GLUCOPHAGE)  because she is taking additional Prednisone due to a flare up  Also wants to know if she should take the COVID vaccine  Please advise

## 2019-02-02 NOTE — Telephone Encounter (Signed)
Request for recent labs faxed to Dr. Melissa Noon office @ [phone].

## 2019-02-09 ENCOUNTER — Ambulatory Visit
Admission: RE | Admit: 2019-02-09 | Discharge: 2019-02-09 | Disposition: A | Discharge status: Home / Self Care | Payer: PPO | Source: Ambulatory Visit | Attending: Internal Medicine | Admitting: Internal Medicine

## 2019-02-09 ENCOUNTER — Other Ambulatory Visit: Payer: Self-pay

## 2019-02-09 DIAGNOSIS — Z1231 Encounter for screening mammogram for malignant neoplasm of breast: Secondary | ICD-10-CM

## 2019-02-20 ENCOUNTER — Other Ambulatory Visit: Payer: Self-pay | Admitting: Internal Medicine

## 2019-03-12 ENCOUNTER — Ambulatory Visit (INDEPENDENT_AMBULATORY_CARE_PROVIDER_SITE_OTHER): Payer: PPO | Admitting: Internal Medicine

## 2019-03-12 ENCOUNTER — Encounter: Payer: Self-pay | Admitting: Internal Medicine

## 2019-03-12 ENCOUNTER — Other Ambulatory Visit: Payer: Self-pay

## 2019-03-12 DIAGNOSIS — H401122 Primary open-angle glaucoma, left eye, moderate stage: Secondary | ICD-10-CM | POA: Diagnosis not present

## 2019-03-12 DIAGNOSIS — G4700 Insomnia, unspecified: Secondary | ICD-10-CM

## 2019-03-12 DIAGNOSIS — M06032 Rheumatoid arthritis without rheumatoid factor, left wrist: Secondary | ICD-10-CM | POA: Diagnosis not present

## 2019-03-12 DIAGNOSIS — Z961 Presence of intraocular lens: Secondary | ICD-10-CM | POA: Diagnosis not present

## 2019-03-12 DIAGNOSIS — I10 Essential (primary) hypertension: Secondary | ICD-10-CM | POA: Diagnosis not present

## 2019-03-12 DIAGNOSIS — H20012 Primary iridocyclitis, left eye: Secondary | ICD-10-CM | POA: Diagnosis not present

## 2019-03-12 DIAGNOSIS — H0288A Meibomian gland dysfunction right eye, upper and lower eyelids: Secondary | ICD-10-CM | POA: Diagnosis not present

## 2019-03-12 DIAGNOSIS — H21233 Degeneration of iris (pigmentary), bilateral: Secondary | ICD-10-CM | POA: Diagnosis not present

## 2019-03-12 DIAGNOSIS — H04123 Dry eye syndrome of bilateral lacrimal glands: Secondary | ICD-10-CM | POA: Diagnosis not present

## 2019-03-12 DIAGNOSIS — M06031 Rheumatoid arthritis without rheumatoid factor, right wrist: Secondary | ICD-10-CM | POA: Diagnosis not present

## 2019-03-12 DIAGNOSIS — H0288B Meibomian gland dysfunction left eye, upper and lower eyelids: Secondary | ICD-10-CM | POA: Diagnosis not present

## 2019-03-12 DIAGNOSIS — H02834 Dermatochalasis of left upper eyelid: Secondary | ICD-10-CM | POA: Diagnosis not present

## 2019-03-12 DIAGNOSIS — H40011 Open angle with borderline findings, low risk, right eye: Secondary | ICD-10-CM | POA: Diagnosis not present

## 2019-03-12 DIAGNOSIS — H02831 Dermatochalasis of right upper eyelid: Secondary | ICD-10-CM | POA: Diagnosis not present

## 2019-03-12 MED ORDER — PREDNISONE 5 MG PO TABS: 15.0000 mg | ORAL_TABLET | Freq: Every day | ORAL | 1 refills | Status: DC

## 2019-03-12 NOTE — Assessment & Plan Note (Signed)
Worse On Predn 5 mg/d Off RA Rx due to nausea F/u w/Dr Amil Amen

## 2019-03-12 NOTE — Progress Notes (Signed)
Subjective:  Patient ID: Deanna Salazar, female    DOB: 07-10-1940  Age: 79 y.o. MRN: [id number]  CC: No chief complaint on file.   HPI [employer] presents for arthralgias - worse F/u insomnia, HTN    Outpatient Medications Prior to Visit  Medication Sig Dispense Refill  . acetaminophen (TYLENOL) 500 MG tablet Take 500-1,000 mg by mouth every 6 (six) hours as needed for mild pain, moderate pain, fever or headache.    Marland Kitchen amLODipine (NORVASC) 5 MG tablet TAKE 1 TABLET BY MOUTH EVERY DAY 90 tablet 2  . aspirin (ASPIRIN CHILDRENS) 81 MG chewable tablet Chew 1 tablet (81 mg total) by mouth daily. 36 tablet 11  . carvedilol (COREG) 25 MG tablet Take 1 tablet (25 mg total) by mouth 2 (two) times daily with a meal. 180 tablet 3  . cholecalciferol (VITAMIN D) 1000 units tablet Take 1,000 Units by mouth daily.    . furosemide (LASIX) 20 MG tablet TAKE 1 TABLET(20 MG) BY MOUTH DAILY 90 tablet 3  . latanoprost (XALATAN) 0.005 % ophthalmic solution INT 1 GTT IN OU Q NIGHT  3  . levothyroxine (SYNTHROID) 25 MCG tablet TAKE 1 TABLET BY MOUTH ONCE DAILY BEFORE BREAKFAST 90 tablet 3  . loratadine (CLARITIN) 10 MG tablet TAKE 1 TABLET BY MOUTH DAILY 100 tablet 3  . meclizine (ANTIVERT) 25 MG tablet TAKE 1 TABLET BY MOUTH THREE TIMES DAILY AS NEEDED FOR DIZZINESS OR NAUSEA 30 tablet 3  . metFORMIN (GLUCOPHAGE) 500 MG tablet Take 1 tablet (500 mg total) by mouth daily with breakfast. 90 tablet 3  . Multiple Vitamins-Minerals (PRESERVISION AREDS 2+MULTI VIT PO) Take by mouth.    Vladimir Faster Glycol-Propyl Glycol (SYSTANE OP) Apply to eye.    . Polyethylene Glycol 3350 (MIRALAX PO) Take by mouth.    . potassium chloride (K-DUR) 10 MEQ tablet TAKE 1 TABLET BY MOUTH ONCE DAILY 90 tablet 3  . predniSONE (DELTASONE) 10 MG tablet Prednisone 10 mg: take 4 tabs a day x 3 days; then 3 tabs a day x 4 days; then 2 tabs a day x 4 days, then 1 tab a day. Take pc. 90 tablet 1  . temazepam (RESTORIL) 15 MG capsule  Take 1 capsule (15 mg total) by mouth at bedtime as needed for sleep. 30 capsule 5  . temazepam (RESTORIL) 30 MG capsule TAKE 1 CAPSULE(30 MG) BY MOUTH AT BEDTIME AS NEEDED FOR SLEEP 90 capsule 1  . TRAVATAN Z 0.004 % SOLN ophthalmic solution INT 1 GTT IN OU HS  11   No facility-administered medications prior to visit.    ROS: Review of Systems  Constitutional: Positive for fatigue. Negative for activity change, appetite change, chills and unexpected weight change.  HENT: Negative for congestion, mouth sores and sinus pressure.   Eyes: Negative for visual disturbance.  Respiratory: Negative for cough, chest tightness and shortness of breath.   Gastrointestinal: Negative for abdominal pain and nausea.  Genitourinary: Negative for difficulty urinating, frequency and vaginal pain.  Musculoskeletal: Positive for arthralgias and back pain. Negative for gait problem.  Skin: Negative for pallor and rash.  Neurological: Negative for dizziness, tremors, weakness, numbness and headaches.  Psychiatric/Behavioral: Positive for sleep disturbance. Negative for confusion and suicidal ideas.    Objective:  BP (!) 152/86 (BP Location: Right Arm, Patient Position: Sitting, Cuff Size: Large)   Pulse 67   Temp 98.7 F (37.1 C) (Oral)   Ht 5\' 3"  (1.6 m)   Wt 202 lb (  91.6 kg)   SpO2 97%   BMI 35.78 kg/m   BP Readings from Last 3 Encounters:  03/12/19 (!) 152/86  11/10/18 (!) 150/76  09/08/18 (!) 144/84    Wt Readings from Last 3 Encounters:  03/12/19 202 lb (91.6 kg)  11/10/18 203 lb (92.1 kg)  09/08/18 204 lb (92.5 kg)    Physical Exam Constitutional:      General: She is not in acute distress.    Appearance: She is well-developed. She is obese.  HENT:     Head: Normocephalic.     Right Ear: External ear normal.     Left Ear: External ear normal.     Nose: Nose normal.  Eyes:     General:        Right eye: No discharge.        Left eye: No discharge.     Conjunctiva/sclera:  Conjunctivae normal.     Pupils: Pupils are equal, round, and reactive to light.  Neck:     Thyroid: No thyromegaly.     Vascular: No JVD.     Trachea: No tracheal deviation.  Cardiovascular:     Rate and Rhythm: Normal rate and regular rhythm.     Heart sounds: Normal heart sounds.  Pulmonary:     Effort: No respiratory distress.     Breath sounds: No stridor. No wheezing.  Abdominal:     General: Bowel sounds are normal. There is no distension.     Palpations: Abdomen is soft. There is no mass.     Tenderness: There is no abdominal tenderness. There is no guarding or rebound.  Musculoskeletal:        General: Tenderness present.     Cervical back: Normal range of motion and neck supple.  Lymphadenopathy:     Cervical: No cervical adenopathy.  Skin:    Findings: No erythema or rash.  Neurological:     Cranial Nerves: No cranial nerve deficit.     Motor: No abnormal muscle tone.     Coordination: Coordination normal.     Deep Tendon Reflexes: Reflexes normal.  Psychiatric:        Behavior: Behavior normal.        Thought Content: Thought content normal.        Judgment: Judgment normal.   pain in joints w/ROM  Lab Results  Component Value Date   WBC 6.5 11/10/2018   HGB 11.5 (L) 11/10/2018   HCT 34.8 (L) 11/10/2018   PLT 221.0 11/10/2018   GLUCOSE 83 11/10/2018   CHOL 191 09/08/2018   TRIG 120.0 09/08/2018   HDL 44.30 09/08/2018   LDLDIRECT 180.9 07/26/2009   LDLCALC 123 (H) 09/08/2018   ALT 14 11/10/2018   AST 23 11/10/2018   NA 139 11/10/2018   K 3.7 11/10/2018   CL 104 11/10/2018   CREATININE 0.86 11/10/2018   BUN 12 11/10/2018   CO2 27 11/10/2018   TSH 2.02 11/10/2018   HGBA1C 6.1 11/10/2018   MICROALBUR 1.8 09/08/2018    MM 3D SCREEN BREAST BILATERAL  Result Date: 02/10/2019 CLINICAL DATA:  Screening. EXAM: DIGITAL SCREENING BILATERAL MAMMOGRAM WITH TOMO AND CAD COMPARISON:  Previous exam(s). ACR Breast Density Category b: There are scattered areas of  fibroglandular density. FINDINGS: There are no findings suspicious for malignancy. Images were processed with CAD. IMPRESSION: No mammographic evidence of malignancy. A result letter of this screening mammogram will be mailed directly to the patient. RECOMMENDATION: Screening mammogram in one year. (Code:SM-B-01Y) BI-RADS CATEGORY  1: Negative. Electronically Signed   By: Ammie Ferrier M.D.   On: 02/10/2019 13:21    Assessment & Plan:    Walker Kehr, MD

## 2019-03-12 NOTE — Patient Instructions (Signed)
Take Prednisone 15 mg a day with food

## 2019-03-19 ENCOUNTER — Ambulatory Visit: Payer: PPO | Attending: Internal Medicine

## 2019-03-19 DIAGNOSIS — H16223 Keratoconjunctivitis sicca, not specified as Sjogren's, bilateral: Secondary | ICD-10-CM | POA: Diagnosis not present

## 2019-03-19 DIAGNOSIS — Z23 Encounter for immunization: Secondary | ICD-10-CM

## 2019-03-19 NOTE — Progress Notes (Signed)
   Covid-19 Vaccination Clinic  Name:  Deanna Salazar    MRN: [id number] DOB: 1940/04/17  03/19/2019  [name] was observed post Covid-19 immunization for 15 minutes without incident. She was provided with Vaccine Information Sheet and instruction to access the V-Safe system.   [name] was instructed to call 911 with any severe reactions post vaccine: Marland Kitchen Difficulty breathing  . Swelling of face and throat  . A fast heartbeat  . A bad rash all over body  . Dizziness and weakness   Immunizations Administered    Name Date Dose VIS Date Route   Pfizer COVID-19 Vaccine 03/19/2019 10:37 AM 0.3 mL 12/19/2018 Intramuscular   Manufacturer: [place]   Lot: [id number]   [place]: [id number]

## 2019-03-20 ENCOUNTER — Other Ambulatory Visit: Payer: Self-pay | Admitting: Internal Medicine

## 2019-03-23 ENCOUNTER — Encounter: Payer: Self-pay | Admitting: Internal Medicine

## 2019-03-23 NOTE — Assessment & Plan Note (Signed)
,   amlodipine, Coreg

## 2019-03-23 NOTE — Assessment & Plan Note (Signed)
Temazepam prn   Potential benefits of a long term benzodiazepines  use as well as potential risks  and complications were explained to the patient and were aknowledged.

## 2019-03-26 DIAGNOSIS — R1011 Right upper quadrant pain: Secondary | ICD-10-CM | POA: Diagnosis not present

## 2019-03-26 DIAGNOSIS — M0609 Rheumatoid arthritis without rheumatoid factor, multiple sites: Secondary | ICD-10-CM | POA: Diagnosis not present

## 2019-03-26 DIAGNOSIS — M2550 Pain in unspecified joint: Secondary | ICD-10-CM | POA: Diagnosis not present

## 2019-03-26 DIAGNOSIS — E669 Obesity, unspecified: Secondary | ICD-10-CM | POA: Diagnosis not present

## 2019-03-26 DIAGNOSIS — E119 Type 2 diabetes mellitus without complications: Secondary | ICD-10-CM | POA: Diagnosis not present

## 2019-03-26 DIAGNOSIS — Z6836 Body mass index (BMI) 36.0-36.9, adult: Secondary | ICD-10-CM | POA: Diagnosis not present

## 2019-04-09 ENCOUNTER — Telehealth: Payer: Self-pay | Admitting: Internal Medicine

## 2019-04-09 DIAGNOSIS — H1045 Other chronic allergic conjunctivitis: Secondary | ICD-10-CM | POA: Diagnosis not present

## 2019-04-09 DIAGNOSIS — H04123 Dry eye syndrome of bilateral lacrimal glands: Secondary | ICD-10-CM | POA: Diagnosis not present

## 2019-04-09 DIAGNOSIS — H16223 Keratoconjunctivitis sicca, not specified as Sjogren's, bilateral: Secondary | ICD-10-CM | POA: Diagnosis not present

## 2019-04-09 DIAGNOSIS — H02834 Dermatochalasis of left upper eyelid: Secondary | ICD-10-CM | POA: Diagnosis not present

## 2019-04-09 DIAGNOSIS — H0288A Meibomian gland dysfunction right eye, upper and lower eyelids: Secondary | ICD-10-CM | POA: Diagnosis not present

## 2019-04-09 DIAGNOSIS — H401122 Primary open-angle glaucoma, left eye, moderate stage: Secondary | ICD-10-CM | POA: Diagnosis not present

## 2019-04-09 DIAGNOSIS — H40011 Open angle with borderline findings, low risk, right eye: Secondary | ICD-10-CM | POA: Diagnosis not present

## 2019-04-09 DIAGNOSIS — H21233 Degeneration of iris (pigmentary), bilateral: Secondary | ICD-10-CM | POA: Diagnosis not present

## 2019-04-09 DIAGNOSIS — H02831 Dermatochalasis of right upper eyelid: Secondary | ICD-10-CM | POA: Diagnosis not present

## 2019-04-09 DIAGNOSIS — H11153 Pinguecula, bilateral: Secondary | ICD-10-CM | POA: Diagnosis not present

## 2019-04-09 DIAGNOSIS — H0288B Meibomian gland dysfunction left eye, upper and lower eyelids: Secondary | ICD-10-CM | POA: Diagnosis not present

## 2019-04-09 NOTE — Telephone Encounter (Signed)
New message:   Pt is calling and would like to know what she should do about her BP and the medication is not working anymore. When she wakes up in the morning it has been 185/105(just today) and it goes back to normal in the afternoon. She states she is not sleeping well at night. She states her body has got used to the medication.

## 2019-04-10 MED ORDER — AMLODIPINE BESYLATE 10 MG PO TABS: 10.0000 mg | ORAL_TABLET | Freq: Every day | ORAL | 3 refills | Status: DC

## 2019-04-10 NOTE — Addendum Note (Signed)
Addended by: Cassandria Anger on: 04/10/2019 09:22 PM   Modules accepted: Orders

## 2019-04-10 NOTE — Telephone Encounter (Signed)
Increase amlodipine to 10 mg a day.  A new prescription was emailed.  Follow-up with me in 1 week.  Thank you

## 2019-04-13 ENCOUNTER — Ambulatory Visit: Payer: PPO | Attending: Internal Medicine

## 2019-04-13 DIAGNOSIS — Z23 Encounter for immunization: Secondary | ICD-10-CM

## 2019-04-13 NOTE — Telephone Encounter (Signed)
Left message for patient and sent her a my-chart message as well.

## 2019-04-13 NOTE — Progress Notes (Signed)
   Covid-19 Vaccination Clinic  Name:  Deanna Salazar    MRN: [id number] DOB: 1940/10/12  04/13/2019  Ms. Deanna Salazar was observed post Covid-19 immunization for 15 minutes without incident. She was provided with Vaccine Information Sheet and instruction to access the V-Safe system.   Ms. Deanna Salazar was instructed to call 911 with any severe reactions post vaccine: Marland Kitchen Difficulty breathing  . Swelling of face and throat  . A fast heartbeat  . A bad rash all over body  . Dizziness and weakness   Immunizations Administered    Name Date Dose VIS Date Route   Pfizer COVID-19 Vaccine 04/13/2019  9:05 AM 0.3 mL 12/19/2018 Intramuscular   Manufacturer: [place]   Lot: [id number]   [place]: [id number]

## 2019-04-22 ENCOUNTER — Other Ambulatory Visit: Payer: Self-pay

## 2019-04-22 ENCOUNTER — Encounter: Payer: Self-pay | Admitting: Internal Medicine

## 2019-04-22 ENCOUNTER — Ambulatory Visit (INDEPENDENT_AMBULATORY_CARE_PROVIDER_SITE_OTHER): Payer: PPO | Admitting: Internal Medicine

## 2019-04-22 VITALS — BP 158/88 | HR 71 | Temp 98.3°F | Ht 63.0 in | Wt 205.0 lb

## 2019-04-22 DIAGNOSIS — M06031 Rheumatoid arthritis without rheumatoid factor, right wrist: Secondary | ICD-10-CM

## 2019-04-22 DIAGNOSIS — M06032 Rheumatoid arthritis without rheumatoid factor, left wrist: Secondary | ICD-10-CM | POA: Diagnosis not present

## 2019-04-22 DIAGNOSIS — F515 Nightmare disorder: Secondary | ICD-10-CM | POA: Insufficient documentation

## 2019-04-22 DIAGNOSIS — E119 Type 2 diabetes mellitus without complications: Secondary | ICD-10-CM | POA: Diagnosis not present

## 2019-04-22 DIAGNOSIS — E559 Vitamin D deficiency, unspecified: Secondary | ICD-10-CM | POA: Diagnosis not present

## 2019-04-22 DIAGNOSIS — E039 Hypothyroidism, unspecified: Secondary | ICD-10-CM | POA: Diagnosis not present

## 2019-04-22 DIAGNOSIS — G4700 Insomnia, unspecified: Secondary | ICD-10-CM | POA: Diagnosis not present

## 2019-04-22 LAB — CBC WITH DIFFERENTIAL/PLATELET
Basophils Absolute: 0.1 10*3/uL (ref 0.0–0.1)
Basophils Relative: 0.7 % (ref 0.0–3.0)
Eosinophils Absolute: 0 10*3/uL (ref 0.0–0.7)
Eosinophils Relative: 0.2 % (ref 0.0–5.0)
HCT: 37.8 % (ref 36.0–46.0)
Hemoglobin: 12.5 g/dL (ref 12.0–15.0)
Lymphocytes Relative: 15.4 % (ref 12.0–46.0)
Lymphs Abs: 1.3 10*3/uL (ref 0.7–4.0)
MCHC: 33.1 g/dL (ref 30.0–36.0)
MCV: 91.7 fl (ref 78.0–100.0)
Monocytes Absolute: 0.6 10*3/uL (ref 0.1–1.0)
Monocytes Relative: 7.6 % (ref 3.0–12.0)
Neutro Abs: 6.2 10*3/uL (ref 1.4–7.7)
Neutrophils Relative %: 76.1 % (ref 43.0–77.0)
Platelets: 203 10*3/uL (ref 150.0–400.0)
RBC: 4.12 Mil/uL (ref 3.87–5.11)
RDW: 13.8 % (ref 11.5–15.5)
WBC: 8.2 10*3/uL (ref 4.0–10.5)

## 2019-04-22 LAB — VITAMIN D 25 HYDROXY (VIT D DEFICIENCY, FRACTURES): VITD: 60.91 ng/mL (ref 30.00–100.00)

## 2019-04-22 LAB — BASIC METABOLIC PANEL
BUN: 17 mg/dL (ref 6–23)
CO2: 30 mEq/L (ref 19–32)
Calcium: 10.4 mg/dL (ref 8.4–10.5)
Chloride: 100 mEq/L (ref 96–112)
Creatinine, Ser: 0.97 mg/dL (ref 0.40–1.20)
GFR: 66.92 mL/min (ref >60.00)
Glucose, Bld: 137 mg/dL — ABNORMAL HIGH (ref 70–99)
Potassium: 4.9 mEq/L (ref 3.5–5.1)
Sodium: 136 mEq/L (ref 135–145)

## 2019-04-22 LAB — URINALYSIS
Bilirubin Urine: NEGATIVE
Ketones, ur: NEGATIVE
Leukocytes,Ua: NEGATIVE
Nitrite: NEGATIVE
Specific Gravity, Urine: 1.01 (ref 1.000–1.030)
Total Protein, Urine: NEGATIVE
Urine Glucose: NEGATIVE
Urobilinogen, UA: 0.2 (ref 0.0–1.0)
pH: 7 (ref 5.0–8.0)

## 2019-04-22 LAB — HEPATIC FUNCTION PANEL
ALT: 24 U/L (ref 0–35)
AST: 18 U/L (ref 0–37)
Albumin: 4.1 g/dL (ref 3.5–5.2)
Alkaline Phosphatase: 77 U/L (ref 39–117)
Bilirubin, Direct: 0.1 mg/dL (ref 0.0–0.3)
Total Bilirubin: 0.4 mg/dL (ref 0.2–1.2)
Total Protein: 7.1 g/dL (ref 6.0–8.3)

## 2019-04-22 LAB — TSH: TSH: 1.03 u[IU]/mL (ref 0.35–4.50)

## 2019-04-22 LAB — T4, FREE: Free T4: 0.67 ng/dL (ref 0.60–1.60)

## 2019-04-22 LAB — HEMOGLOBIN A1C: Hgb A1c MFr Bld: 7 % — ABNORMAL HIGH (ref 4.6–6.5)

## 2019-04-22 MED ORDER — TRAZODONE HCL 50 MG PO TABS: 50.0000 mg | ORAL_TABLET | Freq: Every evening | ORAL | 5 refills | Status: DC | PRN

## 2019-04-22 MED ORDER — CLONAZEPAM 0.5 MG PO TABS: ORAL_TABLET | ORAL | 0 refills | Status: DC

## 2019-04-22 NOTE — Assessment & Plan Note (Signed)
Prdnisone

## 2019-04-22 NOTE — Progress Notes (Signed)
Subjective:  Patient ID: Deanna Salazar, female    DOB: 1940/04/29  Age: 79 y.o. MRN: [id number]  CC: No chief complaint on file.   HPI [employer] presents for insomnia, nightmares (when weaning off) x years. Temazepam is not working any longer  Outpatient Medications Prior to Visit  Medication Sig Dispense Refill  . acetaminophen (TYLENOL) 500 MG tablet Take 500-1,000 mg by mouth every 6 (six) hours as needed for mild pain, moderate pain, fever or headache.    Marland Kitchen amLODipine (NORVASC) 10 MG tablet Take 1 tablet (10 mg total) by mouth daily. 90 tablet 3  . aspirin (ASPIRIN CHILDRENS) 81 MG chewable tablet Chew 1 tablet (81 mg total) by mouth daily. 36 tablet 11  . brimonidine (ALPHAGAN) 0.15 % ophthalmic solution     . carvedilol (COREG) 25 MG tablet Take 1 tablet (25 mg total) by mouth 2 (two) times daily with a meal. 180 tablet 3  . cholecalciferol (VITAMIN D) 1000 units tablet Take 1,000 Units by mouth daily.    . furosemide (LASIX) 20 MG tablet TAKE 1 TABLET(20 MG) BY MOUTH DAILY 90 tablet 3  . latanoprost (XALATAN) 0.005 % ophthalmic solution INT 1 GTT IN OU Q NIGHT  3  . levothyroxine (SYNTHROID) 25 MCG tablet TAKE 1 TABLET BY MOUTH ONCE DAILY BEFORE BREAKFAST 90 tablet 3  . loratadine (CLARITIN) 10 MG tablet TAKE 1 TABLET BY MOUTH DAILY 100 tablet 3  . meclizine (ANTIVERT) 25 MG tablet TAKE 1 TABLET BY MOUTH THREE TIMES DAILY AS NEEDED FOR DIZZINESS OR NAUSEA 30 tablet 3  . metFORMIN (GLUCOPHAGE) 500 MG tablet Take 1 tablet (500 mg total) by mouth daily with breakfast. 90 tablet 3  . Multiple Vitamins-Minerals (PRESERVISION AREDS 2+MULTI VIT PO) Take by mouth.    Vladimir Faster Glycol-Propyl Glycol (SYSTANE OP) Apply to eye.    . Polyethylene Glycol 3350 (MIRALAX PO) Take by mouth.    . potassium chloride (K-DUR) 10 MEQ tablet TAKE 1 TABLET BY MOUTH ONCE DAILY 90 tablet 3  . prednisoLONE acetate (PRED FORTE) 1 % ophthalmic suspension Place 1 drop into the left eye 4 (four) times  daily.    . predniSONE (DELTASONE) 5 MG tablet Take 3 tablets (15 mg total) by mouth daily with breakfast. 100 tablet 1  . temazepam (RESTORIL) 30 MG capsule TAKE 1 CAPSULE(30 MG) BY MOUTH AT BEDTIME AS NEEDED FOR SLEEP 90 capsule 1  . TRAVATAN Z 0.004 % SOLN ophthalmic solution INT 1 GTT IN OU HS  11  . XIIDRA 5 % SOLN 1 drop Both Eyes 2 times a day     No facility-administered medications prior to visit.    ROS: Review of Systems  Objective:  BP (!) 158/88 (BP Location: Right Arm, Patient Position: Sitting, Cuff Size: Large)   Pulse 71   Temp 98.3 F (36.8 C) (Oral)   Ht 5\' 3"  (1.6 m)   Wt 205 lb (93 kg)   SpO2 96%   BMI 36.31 kg/m   BP Readings from Last 3 Encounters:  04/22/19 (!) 158/88  03/12/19 (!) 152/86  11/10/18 (!) 150/76    Wt Readings from Last 3 Encounters:  04/22/19 205 lb (93 kg)  03/12/19 202 lb (91.6 kg)  11/10/18 203 lb (92.1 kg)    Physical Exam  Lab Results  Component Value Date   WBC 6.5 11/10/2018   HGB 11.5 (L) 11/10/2018   HCT 34.8 (L) 11/10/2018   PLT 221.0 11/10/2018   GLUCOSE 83  11/10/2018   CHOL 191 09/08/2018   TRIG 120.0 09/08/2018   HDL 44.30 09/08/2018   LDLDIRECT 180.9 07/26/2009   LDLCALC 123 (H) 09/08/2018   ALT 14 11/10/2018   AST 23 11/10/2018   NA 139 11/10/2018   K 3.7 11/10/2018   CL 104 11/10/2018   CREATININE 0.86 11/10/2018   BUN 12 11/10/2018   CO2 27 11/10/2018   TSH 2.02 11/10/2018   HGBA1C 6.1 11/10/2018   MICROALBUR 1.8 09/08/2018    MM 3D SCREEN BREAST BILATERAL  Result Date: 02/10/2019 CLINICAL DATA:  Screening. EXAM: DIGITAL SCREENING BILATERAL MAMMOGRAM WITH TOMO AND CAD COMPARISON:  Previous exam(s). ACR Breast Density Category b: There are scattered areas of fibroglandular density. FINDINGS: There are no findings suspicious for malignancy. Images were processed with CAD. IMPRESSION: No mammographic evidence of malignancy. A result letter of this screening mammogram will be mailed directly to the  patient. RECOMMENDATION: Screening mammogram in one year. (Code:SM-B-01Y) BI-RADS CATEGORY  1: Negative. Electronically Signed   By: Ammie Ferrier M.D.   On: 02/10/2019 13:21    Assessment & Plan:   There are no diagnoses linked to this encounter.   No orders of the defined types were placed in this encounter.    Follow-up: No follow-ups on file.  Walker Kehr, MD

## 2019-04-22 NOTE — Assessment & Plan Note (Signed)
Wean off Temazepam and start Trazodone Clonazepam taper to prevent withdrawl

## 2019-04-22 NOTE — Assessment & Plan Note (Signed)
Labs

## 2019-04-22 NOTE — Assessment & Plan Note (Signed)
TSH, FT4 

## 2019-04-29 ENCOUNTER — Telehealth: Payer: Self-pay | Admitting: Internal Medicine

## 2019-04-29 NOTE — Telephone Encounter (Signed)
    Pt c/o medication issue:  1. Name of Medication: traZODone (DESYREL) 50 MG tablet  2. How are you currently taking this medication (dosage and times per day)? Not taking  3. Are you having a reaction (difficulty breathing--STAT)? no  4. What is your medication issue? Patient states she is afraid to take Trazadone, she is afraid of having side effects. Requesting alternative

## 2019-04-30 ENCOUNTER — Telehealth: Payer: Self-pay

## 2019-04-30 NOTE — Telephone Encounter (Signed)
Pt notified that Dr Alain Marion would not send in a medication that he did not think would be safe for her to take. And explained that the other prescriptions in the same category will more than likely have the same list of side effects. Pt voiced understanding and will start med

## 2019-04-30 NOTE — Telephone Encounter (Signed)
Pt called and complained of stomach pain and the rheumatologist is recommending pt to have a scan done to see what is behind the pain. Pt would like to know if you can order or if she need to contact GI to see what they recommend.  Please advise

## 2019-05-01 NOTE — Telephone Encounter (Signed)
It sounds like the pain is new.  I will need to see her first.  Can she come in today?

## 2019-05-01 NOTE — Telephone Encounter (Signed)
OV made for 04/06/18 at 8:50

## 2019-05-07 ENCOUNTER — Ambulatory Visit (INDEPENDENT_AMBULATORY_CARE_PROVIDER_SITE_OTHER): Payer: PPO | Admitting: Internal Medicine

## 2019-05-07 ENCOUNTER — Encounter: Payer: Self-pay | Admitting: Internal Medicine

## 2019-05-07 ENCOUNTER — Other Ambulatory Visit: Payer: Self-pay

## 2019-05-07 DIAGNOSIS — I10 Essential (primary) hypertension: Secondary | ICD-10-CM

## 2019-05-07 DIAGNOSIS — E119 Type 2 diabetes mellitus without complications: Secondary | ICD-10-CM

## 2019-05-07 DIAGNOSIS — R1013 Epigastric pain: Secondary | ICD-10-CM

## 2019-05-07 DIAGNOSIS — M06031 Rheumatoid arthritis without rheumatoid factor, right wrist: Secondary | ICD-10-CM | POA: Diagnosis not present

## 2019-05-07 DIAGNOSIS — M06032 Rheumatoid arthritis without rheumatoid factor, left wrist: Secondary | ICD-10-CM | POA: Diagnosis not present

## 2019-05-07 MED ORDER — PREDNISONE 5 MG PO TABS: 12.5000 mg | ORAL_TABLET | Freq: Every day | ORAL | 1 refills | Status: DC

## 2019-05-07 MED ORDER — RABEPRAZOLE SODIUM 20 MG PO TBEC: 20.0000 mg | DELAYED_RELEASE_TABLET | Freq: Every day | ORAL | 3 refills | Status: DC

## 2019-05-07 NOTE — Assessment & Plan Note (Signed)
Nl BP at home this am

## 2019-05-07 NOTE — Patient Instructions (Addendum)
Reduce Prednisone to 12.5 mg/d  Metformin can cause abdominal pain   Small meals 3-4 times a day

## 2019-05-07 NOTE — Assessment & Plan Note (Signed)
On Metformin 

## 2019-05-07 NOTE — Assessment & Plan Note (Signed)
Start Aciphex Small meals 3-4 times a day Abd [place]

## 2019-05-07 NOTE — Assessment & Plan Note (Signed)
Reduce Prednisone to 12.5 mg/d

## 2019-05-07 NOTE — Progress Notes (Signed)
Subjective:  Patient ID: Deanna Salazar, female    DOB: [birth year]  Age: 79 y.o. MRN: [id number]  CC: No chief complaint on file.   HPI [employer] presents for upper abd pain and bloating started a few wks after she started to take MTX - off now and Prednisone - 15 mg/d. 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 EGD, abd CT reviewed - in prior hx  Outpatient Medications Prior to Visit  Medication Sig Dispense Refill  . acetaminophen (TYLENOL) 500 MG tablet Take 500-1,000 mg by mouth every 6 (six) hours as needed for mild pain, moderate pain, fever or headache.    Marland Kitchen amLODipine (NORVASC) 10 MG tablet Take 1 tablet (10 mg total) by mouth daily. 90 tablet 3  . aspirin (ASPIRIN CHILDRENS) 81 MG chewable tablet Chew 1 tablet (81 mg total) by mouth daily. 36 tablet 11  . brimonidine (ALPHAGAN) 0.15 % ophthalmic solution     . carvedilol (COREG) 25 MG tablet Take 1 tablet (25 mg total) by mouth 2 (two) times daily with a meal. 180 tablet 3  . cholecalciferol (VITAMIN D) 1000 units tablet Take 1,000 Units by mouth daily.    . clonazePAM (KLONOPIN) 0.5 MG tablet Take 2 at hs for 1 week, then Take 1 at hs for 1 week, then Take 1/2 at hs for 1 week, then stop or use prn 60 tablet 0  . furosemide (LASIX) 20 MG tablet TAKE 1 TABLET(20 MG) BY MOUTH DAILY 90 tablet 3  . latanoprost (XALATAN) 0.005 % ophthalmic solution INT 1 GTT IN OU Q NIGHT  3  . levothyroxine (SYNTHROID) 25 MCG tablet TAKE 1 TABLET BY MOUTH ONCE DAILY BEFORE BREAKFAST 90 tablet 3  . loratadine (CLARITIN) 10 MG tablet TAKE 1 TABLET BY MOUTH DAILY 100 tablet 3  . meclizine (ANTIVERT) 25 MG tablet TAKE 1 TABLET BY MOUTH THREE TIMES DAILY AS NEEDED FOR DIZZINESS OR NAUSEA 30 tablet 3  . metFORMIN (GLUCOPHAGE) 500 MG tablet Take 1 tablet (500 mg total) by mouth daily with breakfast. 90 tablet 3  . Multiple Vitamins-Minerals (PRESERVISION AREDS 2+MULTI VIT PO) Take by mouth.    Vladimir Faster Glycol-Propyl Glycol (SYSTANE OP) Apply to eye.    . Polyethylene Glycol  3350 (MIRALAX PO) Take by mouth.    . potassium chloride (K-DUR) 10 MEQ tablet TAKE 1 TABLET BY MOUTH ONCE DAILY 90 tablet 3  . prednisoLONE acetate (PRED FORTE) 1 % ophthalmic suspension Place 1 drop into the left eye 4 (four) times daily.    . predniSONE (DELTASONE) 5 MG tablet Take 3 tablets (15 mg total) by mouth daily with breakfast. 100 tablet 1  . TRAVATAN Z 0.004 % SOLN ophthalmic solution INT 1 GTT IN OU HS  11  . traZODone (DESYREL) 50 MG tablet Take 1-2 tablets (50-100 mg total) by mouth at bedtime as needed for sleep. 60 tablet 5  . XIIDRA 5 % SOLN 1 drop Both Eyes 2 times a day     No facility-administered medications prior to visit.    ROS: Review of Systems  Constitutional: Positive for fatigue. Negative for activity change, appetite change, chills and unexpected weight change.  HENT: Negative for congestion, mouth sores and sinus pressure.   Eyes: Negative for visual disturbance.  Respiratory: Negative for cough and chest tightness.   Gastrointestinal: Positive for abdominal distention and abdominal pain. Negative for nausea.  Genitourinary: Negative for difficulty urinating, frequency and vaginal pain.  Musculoskeletal: Positive for arthralgias and back pain. Negative for gait  problem.  Skin: Negative for pallor and rash.  Neurological: Negative for dizziness, tremors, weakness, numbness and headaches.  Psychiatric/Behavioral: Negative for confusion and sleep disturbance.    Objective:  BP (!) 156/86 (BP Location: Left Arm, Patient Position: Sitting, Cuff Size: Large)   Pulse 70   Temp 98.3 F (36.8 C) (Oral)   Ht 5\' 3"  (1.6 m)   Wt 205 lb (93 kg)   SpO2 94%   BMI 36.31 kg/m   BP Readings from Last 3 Encounters:  05/07/19 (!) 156/86  04/22/19 (!) 158/88  03/12/19 (!) 152/86    Wt Readings from Last 3 Encounters:  05/07/19 205 lb (93 kg)  04/22/19 205 lb (93 kg)  03/12/19 202 lb (91.6 kg)    Physical Exam Constitutional:      General: She is not in  acute distress.    Appearance: She is well-developed. She is obese.  HENT:     Head: Normocephalic.     Right Ear: External ear normal.     Left Ear: External ear normal.     Nose: Nose normal.  Eyes:     General:        Right eye: No discharge.        Left eye: No discharge.     Conjunctiva/sclera: Conjunctivae normal.     Pupils: Pupils are equal, round, and reactive to light.  Neck:     Thyroid: No thyromegaly.     Vascular: No JVD.     Trachea: No tracheal deviation.  Cardiovascular:     Rate and Rhythm: Normal rate and regular rhythm.     Heart sounds: Normal heart sounds.  Pulmonary:     Effort: No respiratory distress.     Breath sounds: No stridor. No wheezing.  Abdominal:     General: Bowel sounds are normal. There is distension.     Palpations: Abdomen is soft. There is no mass.     Tenderness: There is abdominal tenderness. There is no guarding or rebound.  Musculoskeletal:        General: No tenderness.     Cervical back: Normal range of motion and neck supple.  Lymphadenopathy:     Cervical: No cervical adenopathy.  Skin:    Findings: No erythema or rash.  Neurological:     Mental Status: She is oriented to person, place, and time.     Cranial Nerves: No cranial nerve deficit.     Motor: No abnormal muscle tone.     Coordination: Coordination normal.     Deep Tendon Reflexes: Reflexes normal.  Psychiatric:        Behavior: Behavior normal.        Thought Content: Thought content normal.        Judgment: Judgment normal.   epig area is tender  Lab Results  Component Value Date   WBC 8.2 04/22/2019   HGB 12.5 04/22/2019   HCT 37.8 04/22/2019   PLT 203.0 04/22/2019   GLUCOSE 137 (H) 04/22/2019   CHOL 191 09/08/2018   TRIG 120.0 09/08/2018   HDL 44.30 09/08/2018   LDLDIRECT 180.9 07/26/2009   LDLCALC 123 (H) 09/08/2018   ALT 24 04/22/2019   AST 18 04/22/2019   NA 136 04/22/2019   K 4.9 04/22/2019   CL 100 04/22/2019   CREATININE 0.97 04/22/2019    BUN 17 04/22/2019   CO2 30 04/22/2019   TSH 1.03 04/22/2019   HGBA1C 7.0 (H) 04/22/2019   MICROALBUR 1.8 09/08/2018  MM 3D SCREEN BREAST BILATERAL  Result Date: 02/10/2019 CLINICAL DATA:  Screening. EXAM: DIGITAL SCREENING BILATERAL MAMMOGRAM WITH TOMO AND CAD COMPARISON:  Previous exam(s). ACR Breast Density Category b: There are scattered areas of fibroglandular density. FINDINGS: There are no findings suspicious for malignancy. Images were processed with CAD. IMPRESSION: No mammographic evidence of malignancy. A result letter of this screening mammogram will be mailed directly to the patient. RECOMMENDATION: Screening mammogram in one year. (Code:SM-B-01Y) BI-RADS CATEGORY  1: Negative. Electronically Signed   By: Ammie Ferrier M.D.   On: 02/10/2019 13:21    Assessment & Plan:   There are no diagnoses linked to this encounter.   No orders of the defined types were placed in this encounter.    Follow-up: No follow-ups on file.  Walker Kehr, MD

## 2019-05-14 ENCOUNTER — Ambulatory Visit
Admission: RE | Admit: 2019-05-14 | Discharge: 2019-05-14 | Disposition: A | Discharge status: Home / Self Care | Payer: PPO | Source: Ambulatory Visit | Attending: Internal Medicine | Admitting: Internal Medicine

## 2019-05-14 DIAGNOSIS — N281 Cyst of kidney, acquired: Secondary | ICD-10-CM | POA: Diagnosis not present

## 2019-05-18 ENCOUNTER — Telehealth: Payer: Self-pay | Admitting: Internal Medicine

## 2019-05-18 NOTE — Telephone Encounter (Signed)
   Patient calling to discuss imaging results Please call

## 2019-05-20 NOTE — Telephone Encounter (Signed)
Please advise about [place] results

## 2019-05-20 NOTE — Telephone Encounter (Signed)
Pt notified and was taking the Tylenol for her arthritic pain and would like to know what else she can use PRN

## 2019-05-20 NOTE — Telephone Encounter (Signed)
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 Nodular liver contour with heterogeneous parenchymal echotexture. Features raise the question of cirrhosis. 2. Left renal cysts.  LFTs are nl however.  Keep ROV. Avoid Tylenol  Thx

## 2019-05-21 NOTE — Telephone Encounter (Signed)
pls limit Tylenol to <1000 mg/day for now. Pls see Dr Amil Amen. Thx

## 2019-05-26 NOTE — Telephone Encounter (Signed)
Pt.notified

## 2019-05-26 NOTE — Telephone Encounter (Signed)
F/u   Asking the CMA to call her to discuss options to take for arthritic pain in her knees to help her get around.

## 2019-05-31 ENCOUNTER — Ambulatory Visit
Admission: EM | Admit: 2019-05-31 | Discharge: 2019-05-31 | Disposition: A | Discharge status: Home / Self Care | Payer: PPO | Attending: Emergency Medicine | Admitting: Emergency Medicine

## 2019-05-31 ENCOUNTER — Ambulatory Visit (INDEPENDENT_AMBULATORY_CARE_PROVIDER_SITE_OTHER): Payer: PPO

## 2019-05-31 ENCOUNTER — Encounter: Payer: Self-pay | Admitting: Emergency Medicine

## 2019-05-31 ENCOUNTER — Other Ambulatory Visit: Payer: Self-pay

## 2019-05-31 DIAGNOSIS — Z7952 Long term (current) use of systemic steroids: Secondary | ICD-10-CM

## 2019-05-31 DIAGNOSIS — M25572 Pain in left ankle and joints of left foot: Secondary | ICD-10-CM

## 2019-05-31 DIAGNOSIS — M25571 Pain in right ankle and joints of right foot: Secondary | ICD-10-CM

## 2019-05-31 DIAGNOSIS — R600 Localized edema: Secondary | ICD-10-CM

## 2019-05-31 DIAGNOSIS — M25561 Pain in right knee: Secondary | ICD-10-CM | POA: Diagnosis not present

## 2019-05-31 DIAGNOSIS — R2233 Localized swelling, mass and lump, upper limb, bilateral: Secondary | ICD-10-CM | POA: Diagnosis not present

## 2019-05-31 DIAGNOSIS — G8929 Other chronic pain: Secondary | ICD-10-CM | POA: Diagnosis not present

## 2019-05-31 DIAGNOSIS — M25562 Pain in left knee: Secondary | ICD-10-CM | POA: Diagnosis not present

## 2019-05-31 DIAGNOSIS — M7989 Other specified soft tissue disorders: Secondary | ICD-10-CM | POA: Diagnosis not present

## 2019-05-31 MED ORDER — KETOROLAC TROMETHAMINE 30 MG/ML IJ SOLN
30.0000 mg | Freq: Once | INTRAMUSCULAR | Status: AC
  Administered 2019-05-31: 30 mg via INTRAMUSCULAR

## 2019-05-31 NOTE — ED Provider Notes (Signed)
[name]    CSN: [id number] Arrival date & time: 05/31/19  1010      History   Chief Complaint Chief Complaint  Patient presents with  . Edema    HPI Deanna Salazar is a 79 y.o. female with extensive medical history as outlined below including hypertension, hypothyroidism, RA, breast cancer, type 2 diabetes, fibromyalgia presenting for bilateral lower extremity edema x1 week.  States her ankles are chronically swollen due to RA.  Patient does have chronic prednisone use: Currently on 10 mg daily.  States this is for arthritis.  Denies history of heart failure.  No lightheadedness, dizziness, increased fatigue, dyspnea, chest pain, palpitations.  No nausea, vomiting, diarrhea.   new  Past Medical History:  Diagnosis Date  . Abdominal pain, generalized 02/05/2008  . ABDOMINAL PAIN-EPIGASTRIC 03/24/2009  . Acute bronchitis 02/15/2007  . Adenomatous colon polyp 04/2000  . Arthritis   . ASCUS favor benign 10/2011   negative high risk HPV screen  . BACK PAIN 07/20/2008  . Breast cancer ([place]) 2009   l, hx of XRT Dr Benay Spice, Left side  . BREAST CANCER, HX OF 04/16/2007  . CHEST PAIN 02/23/2010  . COLONIC POLYPS, ADENOMATOUS, HX OF 10/01/2007  . CONSTIPATION, CHRONIC 05/10/2007  . Dysuria 04/06/2008  . FATIGUE 12/26/2006  . Fatty liver   . FATTY LIVER DISEASE 11/19/2006  . FIBROMYALGIA 11/19/2006  . GERD 11/19/2006   Dr Fuller Plan  . Glaucoma 07/2017   left eye  . HEMATOCHEZIA 05/10/2007  . HEMORRHOIDS, INTERNAL 05/10/2007  . HIP PAIN 07/20/2008  . HYPERLIPIDEMIA 07/26/2009  . HYPERTENSION 12/26/2006  . HYPOKALEMIA 11/25/2007  . HYPOTHYROIDISM 12/26/2006  . IBS 11/19/2006  . INSOMNIA, PERSISTENT 12/26/2006  . KELOID 07/25/2007  . MURMUR 07/25/2007  . Nausea alone 03/23/2010  . PARESTHESIA 04/06/2008  . Personal history of radiation therapy   . RENAL CYST, LEFT 02/23/2010  . Rheumatoid arthritis(714.0) 11/19/2006   Dr Vianne Bulls  . RUQ PAIN 11/07/2009  . Tubular adenoma   .  VISION IMPAIRMENT, LOW VISION, ONE EYE-LEFT 07/25/2007    Patient Active Problem List   Diagnosis Date Noted  . Nightmares 04/22/2019  . Exposure to COVID-19 virus 10/09/2018  . Cough 10/09/2018  . Grief 11/23/2016  . Unilateral primary osteoarthritis, right knee 08/23/2016  . Hypokalemia 05/16/2016  . Rhonchi 02/09/2016  . Intertrigo 10/04/2015  . Allergic rhinitis 04/05/2015  . Well adult exam 06/28/2014  . Tinea pedis 10/13/2013  . Near syncope 09/28/2013  . LBP (low back pain) 09/24/2012  . Diabetes type 2, controlled ([place]) 11/06/2011  . Breast cancer ([place]) 11/06/2011  . URI, acute 03/02/2011  . Flank pain 03/02/2011  . Hyperglycemia 03/02/2011  . Edema 10/30/2010  . Dyspnea and respiratory abnormalities 10/30/2010  . Cramp in limb 10/30/2010  . Bronchitis 10/06/2010  . NAUSEA ALONE 03/23/2010  . RENAL CYST, LEFT 02/23/2010  . Chest pain 02/23/2010  . Dyslipidemia 07/26/2009  . ABDOMINAL PAIN-EPIGASTRIC 03/24/2009  . HIP PAIN 07/20/2008  . BACK PAIN 07/20/2008  . FEVER, NOS 07/20/2008  . PARESTHESIA 04/06/2008  . Dysuria 04/06/2008  . Dysphagia, pharyngoesophageal phase 02/05/2008  . ABDOMINAL PAIN, GENERALIZED 02/05/2008  . HYPOKALEMIA 11/25/2007  . Depression 11/25/2007  . COLONIC POLYPS, ADENOMATOUS, HX OF 10/01/2007  . 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 KELOID 07/25/2007  . MURMUR 07/25/2007  . HEMORRHOIDS, INTERNAL 05/10/2007  . CONSTIPATION, CHRONIC 05/10/2007  . BREAST CANCER, HX OF 04/16/2007  . ACUTE BRONCHITIS 02/15/2007  . Hypothyroidism 12/26/2006  .  INSOMNIA, PERSISTENT 12/26/2006  . Essential hypertension 12/26/2006  . Chronic fatigue 12/26/2006  . GERD 11/19/2006  . IBS 11/19/2006  . FATTY LIVER DISEASE 11/19/2006  . Rheumatoid arthritis ([place]) 11/19/2006  . Fibromyalgia 11/19/2006    Past Surgical History:  Procedure Laterality Date  . BREAST LUMPECTOMY Left 2009  . BREAST SURGERY  2009   Left lumpectomy  .  COLONOSCOPY  06/12/2012  . Cyst excision from chest    . knee surgery Left   . Left thyroidectomy    . OS Cataract extraction  2009  . OVARIAN CYST REMOVAL  age 64  . SHOULDER SURGERY     both shoulders  . TOE SURGERY    . TUBAL LIGATION      OB History    Gravida  5   Para  5   Term      Preterm      AB      Living  5     SAB      TAB      Ectopic      Multiple      Live Births               Home Medications    Prior to Admission medications   Medication Sig Start Date End Date Taking? Authorizing Provider  acetaminophen (TYLENOL) 500 MG tablet Take 500-1,000 mg by mouth every 6 (six) hours as needed for mild pain, moderate pain, fever or headache.    [provider]  amLODipine (NORVASC) 10 MG tablet Take 1 tablet (10 mg total) by mouth daily. 04/10/19 04/09/20  Plotnikov, Evie Lacks, MD  aspirin (ASPIRIN CHILDRENS) 81 MG chewable tablet Chew 1 tablet (81 mg total) by mouth daily. 12/06/14   Plotnikov, Evie Lacks, MD  brimonidine (ALPHAGAN) 0.15 % ophthalmic solution  04/21/19   [provider]  carvedilol (COREG) 25 MG tablet Take 1 tablet (25 mg total) by mouth 2 (two) times daily with a meal. 05/13/18   Plotnikov, Evie Lacks, MD  cholecalciferol (VITAMIN D) 1000 units tablet Take 1,000 Units by mouth daily.    [provider]  clonazePAM (KLONOPIN) 0.5 MG tablet Take 2 at hs for 1 week, then Take 1 at hs for 1 week, then Take 1/2 at hs for 1 week, then stop or use prn 04/22/19   Plotnikov, Evie Lacks, MD  furosemide (LASIX) 20 MG tablet TAKE 1 TABLET(20 MG) BY MOUTH DAILY 08/08/18   Plotnikov, Evie Lacks, MD  latanoprost (XALATAN) 0.005 % ophthalmic solution INT 1 GTT IN OU Q NIGHT 07/08/17   [provider]  levothyroxine (SYNTHROID) 25 MCG tablet TAKE 1 TABLET BY MOUTH ONCE DAILY BEFORE BREAKFAST 08/24/18   Plotnikov, Evie Lacks, MD  loratadine (CLARITIN) 10 MG tablet TAKE 1 TABLET BY MOUTH DAILY 10/05/18   Plotnikov, Evie Lacks, MD    meclizine (ANTIVERT) 25 MG tablet TAKE 1 TABLET BY MOUTH THREE TIMES DAILY AS NEEDED FOR DIZZINESS OR NAUSEA 03/25/18   Plotnikov, Evie Lacks, MD  metFORMIN (GLUCOPHAGE) 500 MG tablet Take 1 tablet (500 mg total) by mouth daily with breakfast. 11/10/18   Plotnikov, Evie Lacks, MD  Multiple Vitamins-Minerals (PRESERVISION AREDS 2+MULTI VIT PO) Take by mouth.    [provider]  Polyethyl Glycol-Propyl Glycol (SYSTANE OP) Apply to eye.    [provider]  Polyethylene Glycol 3350 (MIRALAX PO) Take by mouth.    [provider]  potassium chloride (K-DUR) 10 MEQ tablet TAKE 1 TABLET BY  MOUTH ONCE DAILY 03/25/18   Plotnikov, Evie Lacks, MD  prednisoLONE acetate (PRED FORTE) 1 % ophthalmic suspension Place 1 drop into the left eye 4 (four) times daily. 03/19/19   [provider]  predniSONE (DELTASONE) 5 MG tablet Take 2.5 tablets (12.5 mg total) by mouth daily with breakfast. 05/07/19   Plotnikov, Evie Lacks, MD  RABEprazole (ACIPHEX) 20 MG tablet Take 1 tablet (20 mg total) by mouth daily. 05/07/19   Plotnikov, Evie Lacks, MD  TRAVATAN Z 0.004 % SOLN ophthalmic solution INT 1 GTT IN OU HS 07/23/17   [provider]  traZODone (DESYREL) 50 MG tablet Take 1-2 tablets (50-100 mg total) by mouth at bedtime as needed for sleep. 04/22/19   Plotnikov, Evie Lacks, MD  XIIDRA 5 % SOLN 1 drop Both Eyes 2 times a day 03/12/19   [provider]    Family History Family History  Problem Relation Age of Onset  . Lung cancer Brother   . Colon cancer Brother 30       50's  . Diabetes Father   . Brain cancer Brother   . Stroke Sister   . Breast cancer Neg Hx     Social History Social History   Tobacco Use  . Smoking status: Never Smoker  . Smokeless tobacco: Never Used  Substance Use Topics  . Alcohol use: No    Alcohol/week: 0.0 standard drinks  . Drug use: No     Allergies   Codeine, Hydrocodone, Lovastatin, Pantoprazole, Pepcid [famotidine], Telmisartan,  and Tramadol hcl   Review of Systems As per HPI   Physical Exam Triage Vital Signs ED Triage Vitals  Enc Vitals Group     BP 05/31/19 1033 (!) 146/75     Pulse Rate 05/31/19 1033 76     Resp 05/31/19 1033 20     Temp 05/31/19 1033 98.5 F (36.9 C)     Temp Source 05/31/19 1033 Oral     SpO2 05/31/19 1033 96 %     Weight --      Height --      Head Circumference --      Peak Flow --      Pain Score 05/31/19 1030 9     Pain Loc --      Pain Edu? --      Excl. in [place]? --    No data found.  Updated Vital Signs BP (!) 146/75 (BP Location: Right Arm) Comment (BP Location): large cuff  Pulse 76   Temp 98.5 F (36.9 C) (Oral)   Resp 20   SpO2 96%   Visual Acuity Right Eye Distance:   Left Eye Distance:   Bilateral Distance:    Right Eye Near:   Left Eye Near:    Bilateral Near:     Physical Exam Constitutional:      General: She is not in acute distress.    Appearance: She is obese. She is not ill-appearing.  HENT:     Head: Normocephalic and atraumatic.     Mouth/Throat:     Mouth: Mucous membranes are moist.     Pharynx: Oropharynx is clear.  Eyes:     General: No scleral icterus.    Pupils: Pupils are equal, round, and reactive to light.  Neck:     Comments: Negative JVD Cardiovascular:     Rate and Rhythm: Normal rate and regular rhythm.  Pulmonary:     Effort: Pulmonary effort is normal. No respiratory distress.  Breath sounds: No wheezing.  Musculoskeletal:     Cervical back: Neck supple. No tenderness.     Right lower leg: Edema present.     Left lower leg: Edema present.     Comments: Diffuse, mild swelling of knees bilaterally, ankles bilaterally.  Gait antalgic.  Pulses intact bilaterally.  2+ pitting edema to proximal shin.  Tender.  No warmth, erythema.  Legs symmetric in size  Lymphadenopathy:     Cervical: No cervical adenopathy.  Skin:    Capillary Refill: Capillary refill takes less than 2 seconds.     Coloration: Skin is not  jaundiced or pale.  Neurological:     Mental Status: She is alert and oriented to person, place, and time.      UC Treatments / Results  Labs (all labs ordered are listed, but only abnormal results are displayed) Labs Reviewed - No data to display  EKG   Radiology DG Chest 2 View  Result Date: 05/31/2019 CLINICAL DATA:  Bilateral leg swelling x1 week, chronic prednisone use EXAM: CHEST - 2 VIEW COMPARISON:  02/09/2016 FINDINGS: Lungs are essentially clear. No frank interstitial edema. No pleural effusion or pneumothorax. The heart is top-normal in size. Mild degenerative changes of the visualized thoracolumbar spine. IMPRESSION: Normal chest radiographs. No frank interstitial edema. Electronically Signed   By: Julian Hy M.D.   On: 05/31/2019 11:40    Procedures Procedures (including critical care time)  Medications Ordered in UC Medications  ketorolac (TORADOL) 30 MG/ML injection 30 mg (has no administration in time range)    Initial Impression / Assessment and Plan / UC Course  I have reviewed the triage vital signs and the nursing notes.  Pertinent labs & imaging results that were available during my care of the patient were reviewed by me and considered in my medical decision making (see chart for details).     Patient afebrile, nontoxic in office today.  Hemodynamically stable and without inciting event, injury.  Radiography of knees, ankles deferred at this time.  Patient does have 2+ pitting edema to mid shin with subtle bibasilar rales.  Per chart review, last labs from 04/22/2019 significant for A1c 7.0%, serum creatinine 0.97 (baseline), GFR 66.92 (near baseline), liver enzymes WNL.  Chest x-ray done office, reviewed by me radiology: Negative for interstitial edema, pleural effusion, infiltrate.  Reviewed signs of patient verbalized understanding.  Toradol given in office which patient tolerated well: We will follow-up with PCP and rheumatologist tomorrow via  phone.  Return precautions discussed, patient verbalized understanding and is agreeable to plan. Final Clinical Impressions(s) / UC Diagnoses   Final diagnoses:  Bilateral lower extremity edema  Chronic ankle pain, bilateral  Chronic pain of both knees     Discharge Instructions     Chest x-ray looks good today-this is good news! You were given a pain shot today. Very important to follow-up with your PCP tomorrow. Return for worsening pain, swelling, fever, chest pain, difficulty breathing.    ED Prescriptions    None     PDMP not reviewed this encounter.   Hall[place], [place], [place] 05/31/19 1152

## 2019-05-31 NOTE — ED Triage Notes (Signed)
Patient has noticed an increase in swelling over the past week. Patient reports bilateral ankle pain and swelling that patient reports pcp says is arthritis.  Patient sees a rheumatologist Patient says bilateral knees are swollen.   Denies sob Denies chest pain.   Swelling and pain in lower extremities Swelling and pain in bilateral wrists

## 2019-05-31 NOTE — Discharge Instructions (Signed)
Chest x-ray looks good today-this is good news! You were given a pain shot today. Very important to follow-up with your PCP tomorrow. Return for worsening pain, swelling, fever, chest pain, difficulty breathing.

## 2019-06-10 ENCOUNTER — Telehealth: Payer: Self-pay | Admitting: Internal Medicine

## 2019-06-10 NOTE — Telephone Encounter (Signed)
New message:   Pt states she is having trouble with her knees and states her ankles and toes are swollen. I offered the pt an appt and she states she would like to know can the dr test her for arthritis before she makes an appt. Please advise.

## 2019-06-11 ENCOUNTER — Ambulatory Visit: Payer: PPO | Admitting: Internal Medicine

## 2019-06-11 NOTE — Telephone Encounter (Signed)
I do not think Deanna Salazar needs any arthritis testing.  We already know that she is suffering with rheumatoid arthritis.  It is best if she makes an appointment with her rheumatologist. I will be happy to see her too Thanks

## 2019-06-12 NOTE — Telephone Encounter (Signed)
Pt informed of below. She states her rheumotologist's assistant, Marella Chimes informed her she needed to see [place] before they can see her again. Patient states she can not walk. She went to UC and received a injection that helped for 3 days. Now she is unable to walk again. OV scheduled with PCP on 06/16/19 @ 10:00.

## 2019-06-16 ENCOUNTER — Ambulatory Visit (INDEPENDENT_AMBULATORY_CARE_PROVIDER_SITE_OTHER): Payer: PPO | Admitting: Internal Medicine

## 2019-06-16 ENCOUNTER — Other Ambulatory Visit: Payer: Self-pay

## 2019-06-16 ENCOUNTER — Encounter: Payer: Self-pay | Admitting: Internal Medicine

## 2019-06-16 DIAGNOSIS — M06032 Rheumatoid arthritis without rheumatoid factor, left wrist: Secondary | ICD-10-CM

## 2019-06-16 DIAGNOSIS — M06031 Rheumatoid arthritis without rheumatoid factor, right wrist: Secondary | ICD-10-CM

## 2019-06-16 DIAGNOSIS — R609 Edema, unspecified: Secondary | ICD-10-CM | POA: Diagnosis not present

## 2019-06-16 DIAGNOSIS — I10 Essential (primary) hypertension: Secondary | ICD-10-CM

## 2019-06-16 MED ORDER — FUROSEMIDE 20 MG PO TABS: 20.0000 mg | ORAL_TABLET | Freq: Every day | ORAL | 5 refills | Status: DC | PRN

## 2019-06-16 MED ORDER — PREDNISONE 5 MG PO TABS: 5.0000 mg | ORAL_TABLET | Freq: Every day | ORAL | 1 refills | Status: DC

## 2019-06-16 MED ORDER — KETOROLAC TROMETHAMINE 30 MG/ML IJ SOLN
30.0000 mg | Freq: Once | INTRAMUSCULAR | Status: AC
  Administered 2019-06-16: 30 mg via INTRAMUSCULAR

## 2019-06-16 MED ORDER — GABAPENTIN 100 MG PO CAPS: 100.0000 mg | ORAL_CAPSULE | Freq: Three times a day (TID) | ORAL | 3 refills | Status: DC

## 2019-06-16 NOTE — Patient Instructions (Signed)
Prednisone - reduce to 5 mg a day Start Gabapentin for pain

## 2019-06-16 NOTE — Assessment & Plan Note (Addendum)
BP Readings from Last 3 Encounters:  06/16/19 (!) 158/84  05/31/19 (!) 146/75  05/07/19 (!) 156/86   Treat pain

## 2019-06-16 NOTE — Addendum Note (Signed)
Addended by: Cresenciano Lick on: 06/16/2019 10:51 AM   Modules accepted: Orders

## 2019-06-16 NOTE — Progress Notes (Signed)
Subjective:  Patient ID: Deanna Salazar, female    DOB: 08-16-1940  Age: 79 y.o. MRN: [id number]  CC: No chief complaint on file.   HPI [employer] presents for RA - worse F/u HTN C/o wt gain, Prednisone is not helping w/RA acc to the pt, Tramadol did not help Pt went to UC due to pain - given ketorolac (TORADOL) 30 MG/ML injection 30 mg - it helped x 3 days. Pain is 9/10 now C/o edema   Outpatient Medications Prior to Visit  Medication Sig Dispense Refill  . acetaminophen (TYLENOL) 500 MG tablet Take 500-1,000 mg by mouth every 6 (six) hours as needed for mild pain, moderate pain, fever or headache.    Marland Kitchen amLODipine (NORVASC) 10 MG tablet Take 1 tablet (10 mg total) by mouth daily. 90 tablet 3  . aspirin (ASPIRIN CHILDRENS) 81 MG chewable tablet Chew 1 tablet (81 mg total) by mouth daily. 36 tablet 11  . brimonidine (ALPHAGAN) 0.15 % ophthalmic solution     . carvedilol (COREG) 25 MG tablet Take 1 tablet (25 mg total) by mouth 2 (two) times daily with a meal. 180 tablet 3  . cholecalciferol (VITAMIN D) 1000 units tablet Take 1,000 Units by mouth daily.    . clonazePAM (KLONOPIN) 0.5 MG tablet Take 2 at hs for 1 week, then Take 1 at hs for 1 week, then Take 1/2 at hs for 1 week, then stop or use prn 60 tablet 0  . furosemide (LASIX) 20 MG tablet TAKE 1 TABLET(20 MG) BY MOUTH DAILY 90 tablet 3  . latanoprost (XALATAN) 0.005 % ophthalmic solution INT 1 GTT IN OU Q NIGHT  3  . levothyroxine (SYNTHROID) 25 MCG tablet TAKE 1 TABLET BY MOUTH ONCE DAILY BEFORE BREAKFAST 90 tablet 3  . loratadine (CLARITIN) 10 MG tablet TAKE 1 TABLET BY MOUTH DAILY 100 tablet 3  . meclizine (ANTIVERT) 25 MG tablet TAKE 1 TABLET BY MOUTH THREE TIMES DAILY AS NEEDED FOR DIZZINESS OR NAUSEA 30 tablet 3  . metFORMIN (GLUCOPHAGE) 500 MG tablet Take 1 tablet (500 mg total) by mouth daily with breakfast. 90 tablet 3  . Multiple Vitamins-Minerals (PRESERVISION AREDS 2+MULTI VIT PO) Take by mouth.    Vladimir Faster  Glycol-Propyl Glycol (SYSTANE OP) Apply to eye.    . Polyethylene Glycol 3350 (MIRALAX PO) Take by mouth.    . potassium chloride (K-DUR) 10 MEQ tablet TAKE 1 TABLET BY MOUTH ONCE DAILY 90 tablet 3  . prednisoLONE acetate (PRED FORTE) 1 % ophthalmic suspension Place 1 drop into the left eye 4 (four) times daily.    . predniSONE (DELTASONE) 5 MG tablet Take 2.5 tablets (12.5 mg total) by mouth daily with breakfast. 100 tablet 1  . RABEprazole (ACIPHEX) 20 MG tablet Take 1 tablet (20 mg total) by mouth daily. 90 tablet 3  . traMADol (ULTRAM) 50 MG tablet Take 50 mg by mouth daily as needed.    . TRAVATAN Z 0.004 % SOLN ophthalmic solution INT 1 GTT IN OU HS  11  . traZODone (DESYREL) 50 MG tablet Take 1-2 tablets (50-100 mg total) by mouth at bedtime as needed for sleep. 60 tablet 5  . XIIDRA 5 % SOLN 1 drop Both Eyes 2 times a day     No facility-administered medications prior to visit.    ROS: Review of Systems  Constitutional: Positive for fatigue. Negative for activity change, appetite change, chills and unexpected weight change.  HENT: Negative for congestion, mouth sores  and sinus pressure.   Eyes: Negative for visual disturbance.  Respiratory: Negative for cough and chest tightness.   Gastrointestinal: Negative for abdominal pain and nausea.  Genitourinary: Negative for difficulty urinating, frequency and vaginal pain.  Musculoskeletal: Positive for arthralgias, back pain and gait problem.  Skin: Negative for pallor and rash.  Neurological: Negative for dizziness, tremors, weakness, numbness and headaches.  Psychiatric/Behavioral: Negative for confusion and sleep disturbance.    Objective:  BP (!) 158/84 (BP Location: Right Arm, Patient Position: Sitting, Cuff Size: Large)   Pulse 75   Temp 98.3 F (36.8 C) (Oral)   Ht 5\' 3"  (1.6 m)   Wt 209 lb 2 oz (94.9 kg)   SpO2 98%   BMI 37.04 kg/m   BP Readings from Last 3 Encounters:  06/16/19 (!) 158/84  05/31/19 (!) 146/75    05/07/19 (!) 156/86    Wt Readings from Last 3 Encounters:  06/16/19 209 lb 2 oz (94.9 kg)  05/07/19 205 lb (93 kg)  04/22/19 205 lb (93 kg)    Physical Exam Constitutional:      General: She is not in acute distress.    Appearance: She is well-developed.  HENT:     Head: Normocephalic.     Right Ear: External ear normal.     Left Ear: External ear normal.     Nose: Nose normal.  Eyes:     General:        Right eye: No discharge.        Left eye: No discharge.     Conjunctiva/sclera: Conjunctivae normal.     Pupils: Pupils are equal, round, and reactive to light.  Neck:     Thyroid: No thyromegaly.     Vascular: No JVD.     Trachea: No tracheal deviation.  Cardiovascular:     Rate and Rhythm: Normal rate and regular rhythm.     Heart sounds: Normal heart sounds.  Pulmonary:     Effort: No respiratory distress.     Breath sounds: No stridor. No wheezing.  Abdominal:     General: Bowel sounds are normal. There is no distension.     Palpations: Abdomen is soft. There is no mass.     Tenderness: There is no abdominal tenderness. There is no guarding or rebound.  Musculoskeletal:        General: Tenderness present.     Cervical back: Normal range of motion and neck supple.  Lymphadenopathy:     Cervical: No cervical adenopathy.  Skin:    Findings: No erythema or rash.  Neurological:     Cranial Nerves: No cranial nerve deficit.     Motor: No abnormal muscle tone.     Coordination: Coordination normal.     Deep Tendon Reflexes: Reflexes normal.  Psychiatric:        Behavior: Behavior normal.        Thought Content: Thought content normal.        Judgment: Judgment normal.     Lab Results  Component Value Date   WBC 8.2 04/22/2019   HGB 12.5 04/22/2019   HCT 37.8 04/22/2019   PLT 203.0 04/22/2019   GLUCOSE 137 (H) 04/22/2019   CHOL 191 09/08/2018   TRIG 120.0 09/08/2018   HDL 44.30 09/08/2018   LDLDIRECT 180.9 07/26/2009   LDLCALC 123 (H) 09/08/2018    ALT 24 04/22/2019   AST 18 04/22/2019   NA 136 04/22/2019   K 4.9 04/22/2019   CL 100 04/22/2019  CREATININE 0.97 04/22/2019   BUN 17 04/22/2019   CO2 30 04/22/2019   TSH 1.03 04/22/2019   HGBA1C 7.0 (H) 04/22/2019   MICROALBUR 1.8 09/08/2018    DG Chest 2 View  Result Date: 05/31/2019 CLINICAL DATA:  Bilateral leg swelling x1 week, chronic prednisone use EXAM: CHEST - 2 VIEW COMPARISON:  02/09/2016 FINDINGS: Lungs are essentially clear. No frank interstitial edema. No pleural effusion or pneumothorax. The heart is top-normal in size. Mild degenerative changes of the visualized thoracolumbar spine. IMPRESSION: Normal chest radiographs. No frank interstitial edema. Electronically Signed   By: Julian Hy M.D.   On: 05/31/2019 11:40    Assessment & Plan:   There are no diagnoses linked to this encounter.   No orders of the defined types were placed in this encounter.    Follow-up: No follow-ups on file.  Walker Kehr, MD

## 2019-06-16 NOTE — Assessment & Plan Note (Signed)
Furosemide prn 

## 2019-06-16 NOTE — Assessment & Plan Note (Signed)
Prednisone - reduce to 5 mg a day Start Gabapentin for pain

## 2019-06-18 ENCOUNTER — Other Ambulatory Visit: Payer: Self-pay | Admitting: Internal Medicine

## 2019-06-23 DIAGNOSIS — M0609 Rheumatoid arthritis without rheumatoid factor, multiple sites: Secondary | ICD-10-CM | POA: Diagnosis not present

## 2019-06-23 DIAGNOSIS — M2550 Pain in unspecified joint: Secondary | ICD-10-CM | POA: Diagnosis not present

## 2019-06-23 DIAGNOSIS — R1011 Right upper quadrant pain: Secondary | ICD-10-CM | POA: Diagnosis not present

## 2019-06-23 DIAGNOSIS — E119 Type 2 diabetes mellitus without complications: Secondary | ICD-10-CM | POA: Diagnosis not present

## 2019-06-23 DIAGNOSIS — Z6837 Body mass index (BMI) 37.0-37.9, adult: Secondary | ICD-10-CM | POA: Diagnosis not present

## 2019-06-23 DIAGNOSIS — E669 Obesity, unspecified: Secondary | ICD-10-CM | POA: Diagnosis not present

## 2019-07-09 DIAGNOSIS — H401122 Primary open-angle glaucoma, left eye, moderate stage: Secondary | ICD-10-CM | POA: Diagnosis not present

## 2019-07-09 DIAGNOSIS — H16223 Keratoconjunctivitis sicca, not specified as Sjogren's, bilateral: Secondary | ICD-10-CM | POA: Diagnosis not present

## 2019-07-09 DIAGNOSIS — H40011 Open angle with borderline findings, low risk, right eye: Secondary | ICD-10-CM | POA: Diagnosis not present

## 2019-07-09 DIAGNOSIS — H04123 Dry eye syndrome of bilateral lacrimal glands: Secondary | ICD-10-CM | POA: Diagnosis not present

## 2019-07-09 DIAGNOSIS — H0288B Meibomian gland dysfunction left eye, upper and lower eyelids: Secondary | ICD-10-CM | POA: Diagnosis not present

## 2019-07-09 DIAGNOSIS — H0288A Meibomian gland dysfunction right eye, upper and lower eyelids: Secondary | ICD-10-CM | POA: Diagnosis not present

## 2019-07-09 DIAGNOSIS — Z961 Presence of intraocular lens: Secondary | ICD-10-CM | POA: Diagnosis not present

## 2019-07-09 DIAGNOSIS — H02834 Dermatochalasis of left upper eyelid: Secondary | ICD-10-CM | POA: Diagnosis not present

## 2019-07-09 DIAGNOSIS — H02831 Dermatochalasis of right upper eyelid: Secondary | ICD-10-CM | POA: Diagnosis not present

## 2019-07-09 DIAGNOSIS — H21233 Degeneration of iris (pigmentary), bilateral: Secondary | ICD-10-CM | POA: Diagnosis not present

## 2019-07-09 DIAGNOSIS — H1045 Other chronic allergic conjunctivitis: Secondary | ICD-10-CM | POA: Diagnosis not present

## 2019-07-22 ENCOUNTER — Other Ambulatory Visit: Payer: Self-pay | Admitting: Internal Medicine

## 2019-07-22 DIAGNOSIS — M0609 Rheumatoid arthritis without rheumatoid factor, multiple sites: Secondary | ICD-10-CM | POA: Diagnosis not present

## 2019-07-22 DIAGNOSIS — M2550 Pain in unspecified joint: Secondary | ICD-10-CM | POA: Diagnosis not present

## 2019-07-22 DIAGNOSIS — R1011 Right upper quadrant pain: Secondary | ICD-10-CM | POA: Diagnosis not present

## 2019-07-22 DIAGNOSIS — E669 Obesity, unspecified: Secondary | ICD-10-CM | POA: Diagnosis not present

## 2019-07-22 DIAGNOSIS — Z6836 Body mass index (BMI) 36.0-36.9, adult: Secondary | ICD-10-CM | POA: Diagnosis not present

## 2019-07-22 DIAGNOSIS — E119 Type 2 diabetes mellitus without complications: Secondary | ICD-10-CM | POA: Diagnosis not present

## 2019-07-23 ENCOUNTER — Ambulatory Visit: Payer: PPO | Admitting: Internal Medicine

## 2019-07-28 ENCOUNTER — Encounter: Payer: Self-pay | Admitting: Internal Medicine

## 2019-07-28 ENCOUNTER — Other Ambulatory Visit: Payer: Self-pay

## 2019-07-28 ENCOUNTER — Ambulatory Visit (INDEPENDENT_AMBULATORY_CARE_PROVIDER_SITE_OTHER): Payer: PPO | Admitting: Internal Medicine

## 2019-07-28 DIAGNOSIS — E119 Type 2 diabetes mellitus without complications: Secondary | ICD-10-CM

## 2019-07-28 DIAGNOSIS — M06031 Rheumatoid arthritis without rheumatoid factor, right wrist: Secondary | ICD-10-CM

## 2019-07-28 DIAGNOSIS — R609 Edema, unspecified: Secondary | ICD-10-CM

## 2019-07-28 DIAGNOSIS — M06032 Rheumatoid arthritis without rheumatoid factor, left wrist: Secondary | ICD-10-CM

## 2019-07-28 DIAGNOSIS — I10 Essential (primary) hypertension: Secondary | ICD-10-CM

## 2019-07-28 DIAGNOSIS — E039 Hypothyroidism, unspecified: Secondary | ICD-10-CM

## 2019-07-28 NOTE — Assessment & Plan Note (Addendum)
Labs On steroids now A1c was 6.5%

## 2019-07-28 NOTE — Assessment & Plan Note (Signed)
amlodipine, Coreg, Lasix, Kdur Nl BP at home

## 2019-07-28 NOTE — Progress Notes (Signed)
Subjective:  Patient ID: Deanna Salazar, female    DOB: 07/13/1940  Age: 79 y.o. MRN: [id number]  CC: No chief complaint on file.   HPI [employer] presents for HTN, RA, anxiety On MTX, prednisone. Pain 3/10 now Pt had labs last week A1c was 6.5%  Outpatient Medications Prior to Visit  Medication Sig Dispense Refill  . acetaminophen (TYLENOL) 500 MG tablet Take 500-1,000 mg by mouth every 6 (six) hours as needed for mild pain, moderate pain, fever or headache.    Marland Kitchen amLODipine (NORVASC) 10 MG tablet Take 1 tablet (10 mg total) by mouth daily. 90 tablet 3  . aspirin (ASPIRIN CHILDRENS) 81 MG chewable tablet Chew 1 tablet (81 mg total) by mouth daily. 36 tablet 11  . brimonidine (ALPHAGAN) 0.15 % ophthalmic solution     . carvedilol (COREG) 25 MG tablet TAKE 1 TABLET(25 MG) BY MOUTH TWICE DAILY WITH A MEAL 180 tablet 3  . cholecalciferol (VITAMIN D) 1000 units tablet Take 1,000 Units by mouth daily.    . clonazePAM (KLONOPIN) 0.5 MG tablet Take 2 at hs for 1 week, then Take 1 at hs for 1 week, then Take 1/2 at hs for 1 week, then stop or use prn 60 tablet 0  . folic acid (FOLVITE) 1 MG tablet Take 1 mg by mouth daily.    . furosemide (LASIX) 20 MG tablet TAKE 1 TABLET(20 MG) BY MOUTH DAILY 90 tablet 3  . furosemide (LASIX) 20 MG tablet Take 1-2 tablets (20-40 mg total) by mouth daily as needed for edema. 60 tablet 5  . gabapentin (NEURONTIN) 100 MG capsule Take 1-2 capsules (100-200 mg total) by mouth 3 (three) times daily. 180 capsule 3  . latanoprost (XALATAN) 0.005 % ophthalmic solution INT 1 GTT IN OU Q NIGHT  3  . levothyroxine (SYNTHROID) 25 MCG tablet TAKE 1 TABLET BY MOUTH ONCE DAILY BEFORE BREAKFAST 90 tablet 3  . loratadine (CLARITIN) 10 MG tablet TAKE 1 TABLET BY MOUTH DAILY 100 tablet 3  . meclizine (ANTIVERT) 25 MG tablet TAKE 1 TABLET BY MOUTH THREE TIMES DAILY AS NEEDED FOR DIZZINESS OR NAUSEA 30 tablet 3  . metFORMIN (GLUCOPHAGE) 500 MG tablet Take 1 tablet (500 mg  total) by mouth daily with breakfast. 90 tablet 3  . methotrexate 2.5 MG tablet Take 15 mg by mouth once a week.    . Multiple Vitamins-Minerals (PRESERVISION AREDS 2+MULTI VIT PO) Take by mouth.    Vladimir Faster Glycol-Propyl Glycol (SYSTANE OP) Apply to eye.    . Polyethylene Glycol 3350 (MIRALAX PO) Take by mouth.    . potassium chloride (KLOR-CON) 10 MEQ tablet TAKE 1 TABLET BY MOUTH EVERY DAY 90 tablet 1  . prednisoLONE acetate (PRED FORTE) 1 % ophthalmic suspension Place 1 drop into the left eye 4 (four) times daily.    . predniSONE (DELTASONE) 5 MG tablet Take 1 tablet (5 mg total) by mouth daily with breakfast. 90 tablet 1  . RABEprazole (ACIPHEX) 20 MG tablet Take 1 tablet (20 mg total) by mouth daily. 90 tablet 3  . traMADol (ULTRAM) 50 MG tablet Take 50 mg by mouth daily as needed.    . TRAVATAN Z 0.004 % SOLN ophthalmic solution INT 1 GTT IN OU HS  11  . traZODone (DESYREL) 50 MG tablet Take 1-2 tablets (50-100 mg total) by mouth at bedtime as needed for sleep. 60 tablet 5  . XIIDRA 5 % SOLN 1 drop Both Eyes 2 times a day  No facility-administered medications prior to visit.    ROS: Review of Systems  Constitutional: Negative for activity change, appetite change, chills, fatigue and unexpected weight change.  HENT: Negative for congestion, mouth sores and sinus pressure.   Eyes: Negative for visual disturbance.  Respiratory: Negative for cough and chest tightness.   Gastrointestinal: Negative for abdominal pain and nausea.  Genitourinary: Negative for difficulty urinating, frequency and vaginal pain.  Musculoskeletal: Positive for arthralgias and back pain. Negative for gait problem.  Skin: Negative for pallor and rash.  Neurological: Negative for dizziness, tremors, weakness, numbness and headaches.  Psychiatric/Behavioral: Negative for confusion and sleep disturbance.    Objective:  BP (!) 144/72 (BP Location: Right Arm, Patient Position: Sitting, Cuff Size: Large)    Pulse (!) 59   Temp 98.4 F (36.9 C) (Oral)   Ht 5\' 3"  (1.6 m)   Wt 202 lb (91.6 kg)   SpO2 97%   BMI 35.78 kg/m   BP Readings from Last 3 Encounters:  07/28/19 (!) 144/72  06/16/19 (!) 158/84  05/31/19 (!) 146/75    Wt Readings from Last 3 Encounters:  07/28/19 202 lb (91.6 kg)  06/16/19 209 lb 2 oz (94.9 kg)  05/07/19 205 lb (93 kg)    Physical Exam Constitutional:      General: She is not in acute distress.    Appearance: She is well-developed. She is obese.  HENT:     Head: Normocephalic.     Right Ear: External ear normal.     Left Ear: External ear normal.     Nose: Nose normal.  Eyes:     General:        Right eye: No discharge.        Left eye: No discharge.     Conjunctiva/sclera: Conjunctivae normal.     Pupils: Pupils are equal, round, and reactive to light.  Neck:     Thyroid: No thyromegaly.     Vascular: No JVD.     Trachea: No tracheal deviation.  Cardiovascular:     Rate and Rhythm: Normal rate and regular rhythm.     Heart sounds: Normal heart sounds.  Pulmonary:     Effort: No respiratory distress.     Breath sounds: No stridor. No wheezing.  Abdominal:     General: Bowel sounds are normal. There is no distension.     Palpations: Abdomen is soft. There is no mass.     Tenderness: There is no abdominal tenderness. There is no guarding or rebound.  Musculoskeletal:        General: No tenderness.     Cervical back: Normal range of motion and neck supple.  Lymphadenopathy:     Cervical: No cervical adenopathy.  Skin:    Findings: No erythema or rash.  Neurological:     Cranial Nerves: No cranial nerve deficit.     Motor: No abnormal muscle tone.     Coordination: Coordination normal.     Deep Tendon Reflexes: Reflexes normal.  Psychiatric:        Behavior: Behavior normal.        Thought Content: Thought content normal.        Judgment: Judgment normal.    Trace edema B  Lab Results  Component Value Date   WBC 8.2 04/22/2019   HGB  12.5 04/22/2019   HCT 37.8 04/22/2019   PLT 203.0 04/22/2019   GLUCOSE 137 (H) 04/22/2019   CHOL 191 09/08/2018   TRIG 120.0 09/08/2018  HDL 44.30 09/08/2018   LDLDIRECT 180.9 07/26/2009   LDLCALC 123 (H) 09/08/2018   ALT 24 04/22/2019   AST 18 04/22/2019   NA 136 04/22/2019   K 4.9 04/22/2019   CL 100 04/22/2019   CREATININE 0.97 04/22/2019   BUN 17 04/22/2019   CO2 30 04/22/2019   TSH 1.03 04/22/2019   HGBA1C 7.0 (H) 04/22/2019   MICROALBUR 1.8 09/08/2018    DG Chest 2 View  Result Date: 05/31/2019 CLINICAL DATA:  Bilateral leg swelling x1 week, chronic prednisone use EXAM: CHEST - 2 VIEW COMPARISON:  02/09/2016 FINDINGS: Lungs are essentially clear. No frank interstitial edema. No pleural effusion or pneumothorax. The heart is top-normal in size. Mild degenerative changes of the visualized thoracolumbar spine. IMPRESSION: Normal chest radiographs. No frank interstitial edema. Electronically Signed   By: Julian Hy M.D.   On: 05/31/2019 11:40    Assessment & Plan:    Walker Kehr, MD

## 2019-07-28 NOTE — Assessment & Plan Note (Signed)
On MTX, steroids now

## 2019-07-28 NOTE — Assessment & Plan Note (Signed)
Levothroid 

## 2019-07-28 NOTE — Assessment & Plan Note (Signed)
Lasix Kdur

## 2019-09-09 ENCOUNTER — Telehealth: Payer: Self-pay | Admitting: Internal Medicine

## 2019-09-09 MED ORDER — MECLIZINE HCL 25 MG PO TABS: ORAL_TABLET | ORAL | 3 refills | Status: DC

## 2019-09-09 NOTE — Telephone Encounter (Signed)
New message:   Pt states the Dr gives this to here when she is having trouble with her balance. Pt states if someone else can fill this she would appreciate it. Please advise.  1.Medication Requested: meclizine (ANTIVERT) 25 MG tablet 2. Pharmacy (Name, Street, [place]): [facility] [id number] - [place], [place] - [address] AT [place] 3. On Med List: yes  4. Last Visit with PCP: 07/28/19  5. Next visit date with PCP: 01/28/20   Agent: Please be advised that RX refills may take up to 3 business days. We ask that you follow-up with your pharmacy.

## 2019-09-09 NOTE — Telephone Encounter (Signed)
See 09/09/2019 med refill

## 2019-09-30 ENCOUNTER — Other Ambulatory Visit: Payer: Self-pay | Admitting: Internal Medicine

## 2019-10-07 DIAGNOSIS — H21233 Degeneration of iris (pigmentary), bilateral: Secondary | ICD-10-CM | POA: Diagnosis not present

## 2019-10-07 DIAGNOSIS — H16223 Keratoconjunctivitis sicca, not specified as Sjogren's, bilateral: Secondary | ICD-10-CM | POA: Diagnosis not present

## 2019-10-07 DIAGNOSIS — H40011 Open angle with borderline findings, low risk, right eye: Secondary | ICD-10-CM | POA: Diagnosis not present

## 2019-10-07 DIAGNOSIS — H401122 Primary open-angle glaucoma, left eye, moderate stage: Secondary | ICD-10-CM | POA: Diagnosis not present

## 2019-10-07 DIAGNOSIS — H0288A Meibomian gland dysfunction right eye, upper and lower eyelids: Secondary | ICD-10-CM | POA: Diagnosis not present

## 2019-10-07 DIAGNOSIS — H0288B Meibomian gland dysfunction left eye, upper and lower eyelids: Secondary | ICD-10-CM | POA: Diagnosis not present

## 2019-10-11 ENCOUNTER — Other Ambulatory Visit: Payer: Self-pay | Admitting: Internal Medicine

## 2019-10-15 ENCOUNTER — Telehealth: Payer: Self-pay | Admitting: Internal Medicine

## 2019-10-15 NOTE — Telephone Encounter (Signed)
Patient called and is requesting a medication reaction with gabapentin (NEURONTIN) 100 MG capsule  traMADol (ULTRAM) 50 MG tablet It can be sent to  She said that she is having bad nightmares and in the morning she states that her bp is elevated. She is also waking up with a headache.   She is requesting a call back at [phone]

## 2019-10-16 NOTE — Telephone Encounter (Signed)
Is she having a reaction with gabapentin or tramadol or both? Thanks

## 2019-10-20 ENCOUNTER — Ambulatory Visit: Payer: PPO | Attending: Internal Medicine

## 2019-10-20 DIAGNOSIS — Z23 Encounter for immunization: Secondary | ICD-10-CM

## 2019-10-20 NOTE — Progress Notes (Signed)
   Covid-19 Vaccination Clinic  Name:  Deanna Salazar    MRN: [id number] DOB: October 26, 1940  10/20/2019  Deanna Salazar was observed post Covid-19 immunization for 15 minutes without incident. She was provided with Vaccine Information Sheet and instruction to access the V-Safe system.   Deanna Salazar was instructed to call [phone] with any severe reactions post vaccine: Marland Kitchen Difficulty breathing  . Swelling of face and throat  . A fast heartbeat  . A bad rash all over body  . Dizziness and weakness

## 2019-10-21 DIAGNOSIS — H0288A Meibomian gland dysfunction right eye, upper and lower eyelids: Secondary | ICD-10-CM | POA: Diagnosis not present

## 2019-10-21 DIAGNOSIS — H21233 Degeneration of iris (pigmentary), bilateral: Secondary | ICD-10-CM | POA: Diagnosis not present

## 2019-10-21 DIAGNOSIS — H16223 Keratoconjunctivitis sicca, not specified as Sjogren's, bilateral: Secondary | ICD-10-CM | POA: Diagnosis not present

## 2019-10-21 DIAGNOSIS — H0288B Meibomian gland dysfunction left eye, upper and lower eyelids: Secondary | ICD-10-CM | POA: Diagnosis not present

## 2019-10-21 DIAGNOSIS — H40011 Open angle with borderline findings, low risk, right eye: Secondary | ICD-10-CM | POA: Diagnosis not present

## 2019-10-21 DIAGNOSIS — E119 Type 2 diabetes mellitus without complications: Secondary | ICD-10-CM | POA: Diagnosis not present

## 2019-10-23 NOTE — Telephone Encounter (Signed)
Called and left message for patient to return call to clinic to discuss reaction.

## 2019-10-28 ENCOUNTER — Other Ambulatory Visit: Payer: Self-pay | Admitting: Internal Medicine

## 2019-10-28 MED ORDER — ONETOUCH ULTRASOFT LANCETS MISC: 3 refills | Status: DC

## 2019-10-28 MED ORDER — ONETOUCH ULTRA 2 W/DEVICE KIT: PACK | 0 refills | Status: DC

## 2019-10-28 MED ORDER — ONETOUCH ULTRA VI STRP: ORAL_STRIP | 3 refills | Status: DC

## 2019-10-28 NOTE — Telephone Encounter (Signed)
    Patient requesting order for One Touch Freestyle glucometer, test strips and lancets  Pharmacy [facility] [place], [place] AT [place]

## 2019-10-28 NOTE — Telephone Encounter (Signed)
Reviewed chart pt is up-to-date sent BS monitor w/supplies to p[facility].Marland KitchenJohny Chess

## 2019-10-30 ENCOUNTER — Ambulatory Visit: Payer: PPO

## 2019-11-05 ENCOUNTER — Other Ambulatory Visit: Payer: Self-pay

## 2019-11-05 ENCOUNTER — Ambulatory Visit (INDEPENDENT_AMBULATORY_CARE_PROVIDER_SITE_OTHER): Payer: PPO

## 2019-11-05 DIAGNOSIS — Z23 Encounter for immunization: Secondary | ICD-10-CM

## 2019-11-09 DIAGNOSIS — M2550 Pain in unspecified joint: Secondary | ICD-10-CM | POA: Diagnosis not present

## 2019-11-09 DIAGNOSIS — M0609 Rheumatoid arthritis without rheumatoid factor, multiple sites: Secondary | ICD-10-CM | POA: Diagnosis not present

## 2019-11-09 DIAGNOSIS — M25561 Pain in right knee: Secondary | ICD-10-CM | POA: Diagnosis not present

## 2019-11-09 DIAGNOSIS — Z6835 Body mass index (BMI) 35.0-35.9, adult: Secondary | ICD-10-CM | POA: Diagnosis not present

## 2019-11-09 DIAGNOSIS — E119 Type 2 diabetes mellitus without complications: Secondary | ICD-10-CM | POA: Diagnosis not present

## 2019-11-09 DIAGNOSIS — R600 Localized edema: Secondary | ICD-10-CM | POA: Diagnosis not present

## 2019-11-09 DIAGNOSIS — E669 Obesity, unspecified: Secondary | ICD-10-CM | POA: Diagnosis not present

## 2019-11-09 DIAGNOSIS — M1711 Unilateral primary osteoarthritis, right knee: Secondary | ICD-10-CM | POA: Diagnosis not present

## 2019-12-07 ENCOUNTER — Other Ambulatory Visit: Payer: Self-pay | Admitting: Internal Medicine

## 2019-12-16 DIAGNOSIS — H0288A Meibomian gland dysfunction right eye, upper and lower eyelids: Secondary | ICD-10-CM | POA: Diagnosis not present

## 2019-12-16 DIAGNOSIS — E119 Type 2 diabetes mellitus without complications: Secondary | ICD-10-CM | POA: Diagnosis not present

## 2019-12-16 DIAGNOSIS — H16223 Keratoconjunctivitis sicca, not specified as Sjogren's, bilateral: Secondary | ICD-10-CM | POA: Diagnosis not present

## 2019-12-16 DIAGNOSIS — H401122 Primary open-angle glaucoma, left eye, moderate stage: Secondary | ICD-10-CM | POA: Diagnosis not present

## 2019-12-16 DIAGNOSIS — H1012 Acute atopic conjunctivitis, left eye: Secondary | ICD-10-CM | POA: Diagnosis not present

## 2019-12-16 DIAGNOSIS — H40011 Open angle with borderline findings, low risk, right eye: Secondary | ICD-10-CM | POA: Diagnosis not present

## 2019-12-22 ENCOUNTER — Ambulatory Visit (INDEPENDENT_AMBULATORY_CARE_PROVIDER_SITE_OTHER): Payer: PPO

## 2019-12-22 ENCOUNTER — Ambulatory Visit (INDEPENDENT_AMBULATORY_CARE_PROVIDER_SITE_OTHER): Payer: PPO | Admitting: Family

## 2019-12-22 ENCOUNTER — Encounter: Payer: Self-pay | Admitting: Family

## 2019-12-22 ENCOUNTER — Other Ambulatory Visit: Payer: Self-pay

## 2019-12-22 VITALS — BP 150/84 | HR 64 | Temp 98.3°F | Ht 63.0 in | Wt 199.0 lb

## 2019-12-22 DIAGNOSIS — M542 Cervicalgia: Secondary | ICD-10-CM

## 2019-12-22 DIAGNOSIS — M19012 Primary osteoarthritis, left shoulder: Secondary | ICD-10-CM | POA: Diagnosis not present

## 2019-12-22 MED ORDER — METHOCARBAMOL 500 MG PO TABS: 500.0000 mg | ORAL_TABLET | Freq: Three times a day (TID) | ORAL | 0 refills | Status: DC | PRN

## 2019-12-22 NOTE — Progress Notes (Signed)
Deanna Salazar is a 79 y.o. female with the following history as recorded in EpicCare:  Patient Active Problem List   Diagnosis Date Noted  . Nightmares 04/22/2019  . Exposure to COVID-19 virus 10/09/2018  . Cough 10/09/2018  . Grief 11/23/2016  . Unilateral primary osteoarthritis, right knee 08/23/2016  . Hypokalemia 05/16/2016  . Rhonchi 02/09/2016  . Intertrigo 10/04/2015  . Allergic rhinitis 04/05/2015  . Well adult exam 06/28/2014  . Tinea pedis 10/13/2013  . Near syncope 09/28/2013  . LBP (low back pain) 09/24/2012  . Diabetes type 2, controlled ([place]) 11/06/2011  . Breast cancer ([place]) 11/06/2011  . URI, acute 03/02/2011  . Flank pain 03/02/2011  . Hyperglycemia 03/02/2011  . Edema 10/30/2010  . Dyspnea and respiratory abnormalities 10/30/2010  . Cramp in limb 10/30/2010  . Bronchitis 10/06/2010  . NAUSEA ALONE 03/23/2010  . RENAL CYST, LEFT 02/23/2010  . Chest pain 02/23/2010  . Dyslipidemia 07/26/2009  . ABDOMINAL PAIN-EPIGASTRIC 03/24/2009  . HIP PAIN 07/20/2008  . BACK PAIN 07/20/2008  . FEVER, NOS 07/20/2008  . PARESTHESIA 04/06/2008  . Dysuria 04/06/2008  . Dysphagia, pharyngoesophageal phase 02/05/2008  . ABDOMINAL PAIN, GENERALIZED 02/05/2008  . HYPOKALEMIA 11/25/2007  . Depression 11/25/2007  . COLONIC POLYPS, ADENOMATOUS, HX OF 10/01/2007  . 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 KELOID 07/25/2007  . MURMUR 07/25/2007  . HEMORRHOIDS, INTERNAL 05/10/2007  . CONSTIPATION, CHRONIC 05/10/2007  . BREAST CANCER, HX OF 04/16/2007  . ACUTE BRONCHITIS 02/15/2007  . Hypothyroidism 12/26/2006  . INSOMNIA, PERSISTENT 12/26/2006  . Essential hypertension 12/26/2006  . Chronic fatigue 12/26/2006  . GERD 11/19/2006  . IBS 11/19/2006  . FATTY LIVER DISEASE 11/19/2006  . Rheumatoid arthritis ([place]) 11/19/2006  . Fibromyalgia 11/19/2006    Current Outpatient Medications  Medication Sig Dispense Refill  . acetaminophen (TYLENOL) 500 MG  tablet Take 500-1,000 mg by mouth every 6 (six) hours as needed for mild pain, moderate pain, fever or headache.    Marland Kitchen amLODipine (NORVASC) 10 MG tablet Take 1 tablet (10 mg total) by mouth daily. 90 tablet 3  . aspirin (ASPIRIN CHILDRENS) 81 MG chewable tablet Chew 1 tablet (81 mg total) by mouth daily. 36 tablet 11  . Blood Glucose Monitoring Suppl (ONE TOUCH ULTRA 2) w/Device KIT Use to check blood sugars daily 1 kit 0  . brimonidine (ALPHAGAN) 0.15 % ophthalmic solution     . carvedilol (COREG) 25 MG tablet TAKE 1 TABLET(25 MG) BY MOUTH TWICE DAILY WITH A MEAL 180 tablet 3  . cholecalciferol (VITAMIN D) 1000 units tablet Take 1,000 Units by mouth daily.    . folic acid (FOLVITE) 1 MG tablet Take 1 mg by mouth daily.    . furosemide (LASIX) 20 MG tablet Take 1-2 tablets (20-40 mg total) by mouth daily as needed for edema. 60 tablet 5  . gabapentin (NEURONTIN) 100 MG capsule Take 1-2 capsules (100-200 mg total) by mouth 3 (three) times daily. 180 capsule 3  . glucose blood (ONETOUCH ULTRA) test strip Use to check blood sugars daily 100 each 3  . Lancets (ONETOUCH ULTRASOFT) lancets Use to check blood sugars daily 100 each 3  . latanoprost (XALATAN) 0.005 % ophthalmic solution INT 1 GTT IN OU Q NIGHT  3  . levothyroxine (SYNTHROID) 25 MCG tablet TAKE 1 TABLET BY MOUTH ONCE DAILY BEFORE BREAKFAST 90 tablet 3  . meclizine (ANTIVERT) 25 MG tablet TAKE 1 TABLET BY MOUTH THREE TIMES DAILY AS NEEDED FOR DIZZINESS OR NAUSEA 30  tablet 3  . metFORMIN (GLUCOPHAGE) 500 MG tablet TAKE 1 TABLET(500 MG) BY MOUTH DAILY WITH BREAKFAST 90 tablet 3  . methotrexate 2.5 MG tablet Take 15 mg by mouth once a week.    . Multiple Vitamins-Minerals (PRESERVISION AREDS 2+MULTI VIT PO) Take by mouth.    Vladimir Faster Glycol-Propyl Glycol (SYSTANE OP) Apply to eye.    . Polyethylene Glycol 3350 (MIRALAX PO) Take by mouth.    . potassium chloride (KLOR-CON) 10 MEQ tablet TAKE 1 TABLET BY MOUTH EVERY DAY 90 tablet 1  .  RABEprazole (ACIPHEX) 20 MG tablet Take 1 tablet (20 mg total) by mouth daily. 90 tablet 3  . traMADol (ULTRAM) 50 MG tablet Take 50 mg by mouth daily as needed.    . TRAVATAN Z 0.004 % SOLN ophthalmic solution INT 1 GTT IN OU HS  11  . XIIDRA 5 % SOLN 1 drop Both Eyes 2 times a day    . methocarbamol (ROBAXIN) 500 MG tablet Take 1 tablet (500 mg total) by mouth every 8 (eight) hours as needed. 30 tablet 0  . prednisoLONE acetate (PRED FORTE) 1 % ophthalmic suspension Place 1 drop into the left eye 4 (four) times daily. (Patient not taking: Reported on 12/22/2019)    . traZODone (DESYREL) 50 MG tablet TAKE 1 TO 2 TABLETS(50 TO 100 MG) BY MOUTH AT BEDTIME AS NEEDED FOR SLEEP (Patient not taking: Reported on 12/22/2019) 60 tablet 5   No current facility-administered medications for this visit.    Allergies: Codeine, Hydrocodone, Lovastatin, Pantoprazole, Pepcid [famotidine], Telmisartan, and Tramadol hcl  Past Medical History:  Diagnosis Date  . Abdominal pain, generalized 02/05/2008  . ABDOMINAL PAIN-EPIGASTRIC 03/24/2009  . Acute bronchitis 02/15/2007  . Adenomatous colon polyp 04/2000  . Arthritis   . ASCUS favor benign 10/2011   negative high risk HPV screen  . BACK PAIN 07/20/2008  . Breast cancer ([place]) 2009   l, hx of XRT Dr Benay Spice, Left side  . BREAST CANCER, HX OF 04/16/2007  . CHEST PAIN 02/23/2010  . COLONIC POLYPS, ADENOMATOUS, HX OF 10/01/2007  . CONSTIPATION, CHRONIC 05/10/2007  . Dysuria 04/06/2008  . FATIGUE 12/26/2006  . Fatty liver   . FATTY LIVER DISEASE 11/19/2006  . FIBROMYALGIA 11/19/2006  . GERD 11/19/2006   Dr Fuller Plan  . Glaucoma 07/2017   left eye  . HEMATOCHEZIA 05/10/2007  . HEMORRHOIDS, INTERNAL 05/10/2007  . HIP PAIN 07/20/2008  . HYPERLIPIDEMIA 07/26/2009  . HYPERTENSION 12/26/2006  . HYPOKALEMIA 11/25/2007  . HYPOTHYROIDISM 12/26/2006  . IBS 11/19/2006  . INSOMNIA, PERSISTENT 12/26/2006  . KELOID 07/25/2007  . MURMUR 07/25/2007  . Nausea alone 03/23/2010  .  PARESTHESIA 04/06/2008  . Personal history of radiation therapy   . RENAL CYST, LEFT 02/23/2010  . Rheumatoid arthritis(714.0) 11/19/2006   Dr Vianne Bulls  . RUQ PAIN 11/07/2009  . Tubular adenoma   . VISION IMPAIRMENT, LOW VISION, ONE EYE-LEFT 07/25/2007    Past Surgical History:  Procedure Laterality Date  . BREAST LUMPECTOMY Left 2009  . BREAST SURGERY  2009   Left lumpectomy  . COLONOSCOPY  06/12/2012  . Cyst excision from chest    . knee surgery Left   . Left thyroidectomy    . OS Cataract extraction  2009  . OVARIAN CYST REMOVAL  age 7  . SHOULDER SURGERY     both shoulders  . TOE SURGERY    . TUBAL LIGATION      Family History  Problem Relation Age of Onset  .  Lung cancer Brother   . Colon cancer Brother 75       50's  . Diabetes Father   . Brain cancer Brother   . Stroke Sister   . Breast cancer Neg Hx     Social History   Tobacco Use  . Smoking status: Never Smoker  . Smokeless tobacco: Never Used  Substance Use Topics  . Alcohol use: No    Alcohol/week: 0.0 standard drinks    Subjective:  Patient complaining of left sided neck/left shoulder pain x 5 days; started after moving the trashcan; feels pain radiating down into left hand; notes that having increased difficulty lifting her left arm/ moving her shoulder; Is requesting X-rays today; adamant that she does not want prednisone due to concerns for weight gain;     Objective:  Vitals:   12/22/19 1141  BP: (!) 150/84  Pulse: 64  Temp: 98.3 F (36.8 C)  TempSrc: Oral  SpO2: 98%  Weight: 199 lb (90.3 kg)  Height: 5' 3"  (1.6 m)    General: Well developed, well nourished, in no acute distress  Skin : Warm and dry.  Head: Normocephalic and atraumatic  Lungs: Respirations unlabored; clear to auscultation bilaterally without wheeze, rales, rhonchi  Musculoskeletal: No deformities; LROM of left shoulder on active motion; tenderness noted over trapezius muscle Extremities: No edema, cyanosis, clubbing   Vessels: Symmetric bilaterally  Neurologic: Alert and oriented; speech intact; face symmetrical; moves all extremities well; CNII-XII intact without focal deficit   Assessment:  1. Neck pain     Plan:  Will update cervical and left shoulder X-ray today; patient defers oral prednisone or injection; agrees to trial of muscle relaxer; will go ahead and have her schedule follow-up with sports medicine for next week- may need ultrasound of left shoulder; follow up to be determined;  This visit occurred during the SARS-CoV-2 public health emergency.  Safety protocols were in place, including screening questions prior to the visit, additional usage of staff PPE, and extensive cleaning of exam room while observing appropriate contact time as indicated for disinfecting solutions.     Return for Dr. Georgina Snell for left shoulder pain.  Orders Placed This Encounter  Procedures  . DG Cervical Spine 2 or 3 views    Standing Status:   Future    Number of Occurrences:   1    Standing Expiration Date:   12/21/2020    Order Specific Question:   Reason for Exam (SYMPTOM  OR DIAGNOSIS REQUIRED)    Answer:   neck pain    Order Specific Question:   Preferred imaging location?    Answer:   [facility]  . DG Shoulder Left    Standing Status:   Future    Number of Occurrences:   1    Standing Expiration Date:   12/21/2020    Order Specific Question:   Reason for Exam (SYMPTOM  OR DIAGNOSIS REQUIRED)    Answer:   left shoulder pain    Order Specific Question:   Preferred imaging location?    Answer:   [facility]    Requested Prescriptions   Signed Prescriptions Disp Refills  . methocarbamol (ROBAXIN) 500 MG tablet 30 tablet 0    Sig: Take 1 tablet (500 mg total) by mouth every 8 (eight) hours as needed.

## 2019-12-23 ENCOUNTER — Telehealth: Payer: Self-pay | Admitting: Internal Medicine

## 2019-12-23 NOTE — Telephone Encounter (Signed)
Patient called and was wondering if she could get a call back in regards to taking Prednisone. She can be reached at [phone]

## 2019-12-23 NOTE — Telephone Encounter (Signed)
Contacted patient to notify her of [facility] last orders.

## 2019-12-23 NOTE — Telephone Encounter (Signed)
I am fine with her using a short course of prednisone. I would recommend that she take 20 mg ( 4 tablets daily) for the next 5 days. However, I think she really needs to see sports medicine. They need to ultrasound that shoulder.

## 2019-12-24 ENCOUNTER — Telehealth: Payer: Self-pay | Admitting: Internal Medicine

## 2019-12-24 NOTE — Telephone Encounter (Signed)
    Patient calling to report traZODone (DESYREL) 50 MG tablet is causing her to have nightmares. She is requesting a new order for Ambien

## 2019-12-25 MED ORDER — ZOLPIDEM TARTRATE 5 MG PO TABS: 5.0000 mg | ORAL_TABLET | Freq: Every evening | ORAL | 2 refills | Status: DC | PRN

## 2019-12-25 NOTE — Telephone Encounter (Signed)
Okay to discontinue trazodone.  Zolpidem prescription was emailed to the pharmacy.  Please schedule a follow-up appointment to see me.  Thanks

## 2019-12-28 NOTE — Telephone Encounter (Signed)
Notified pt w/MD response.../lmb 

## 2019-12-29 NOTE — Progress Notes (Signed)
Subjective:    CC: L shoulder pain  I, Deanna Salazar, LAT, ATC, am serving as scribe for Dr. Lynne Leader.  HPI: Pt is a 79 y/o female presenting w/ c/o neck and L shoulder pain x approximately 2 weeks that began after she moved a trashcan.  She was seen by her PCP on 12/22/19 and was prescribed methocarbamol and had a L shoulder and C-spine XR.  Today, pt reports shoulder is slightly better, but she is trying to not use the arm. Currently she locates her pain to anterior and super shoulder that radiates down to elbow and sometimes down into hand. Pt c/o pain into chest and anterior shoulder when flexing shoulder.  She tried some leftover prednisone that helped temporarily.  Radiating pain: yes down the L arm to the L hand L UE numbness/tingling: yes L UE weakness: unknown, not using Aggravating factors: attempts at L shoulder AROM above ; Treatments tried: Leftover prednisone methocarbamol;   Diagnostic imaging: L shoulder and C-spine XR- 12/22/19  Pertinent review of Systems: No fevers or chills  Relevant historical information: Diabetes, rheumatoid arthritis on methotrexate.   Objective:    Vitals:   12/30/19 0929  BP: 132/86  Pulse: 60  SpO2: 98%   General: Well Developed, well nourished, and in no acute distress.   MSK: C-spine normal-appearing. Nontender midline. Decreased cervical motion.  Some pain with left lateral flexion and rotation.  Positive left-sided Spurling's test. Within limits of shoulder motion upper extremity strength is intact as are reflexes and sensation.  Left shoulder normal-appearing Nontender. Range of motion limited external rotation.  Normal internal rotation.  Passive abduction full with only minimal pain.  Lab and Radiology Results DG Cervical Spine 2 or 3 views  Result Date: 12/22/2019 CLINICAL DATA:  Neck pain. EXAM: CERVICAL SPINE - 2-3 VIEW COMPARISON:  CT cervical spine 06/10/2003 FINDINGS: Alignment of the cervical spine is  normal. There is multilevel degenerative endplate changes with disc space narrowing most prominent at C5-C6 and C6-C7. Alignment at the cervicothoracic junction is normal. Prevertebral soft tissues are normal. Evidence for bilateral facet arthropathy. Vertebral body heights are maintained. No evidence for a fracture. IMPRESSION: Multilevel degenerative changes in the cervical spine. Electronically Signed   By: Markus Daft M.D.   On: 12/22/2019 17:06   DG Shoulder Left  Result Date: 12/22/2019 CLINICAL DATA:  Left shoulder pain. Pain with abduction radiating down left arm. Pain for 5 days after taking out the trash. EXAM: LEFT SHOULDER - 2+ VIEW COMPARISON:  None. FINDINGS: No fracture. Superior subluxation of the humeral head abutting the undersurface of the acromion. Associated subcortical cystic change of the acromion and humeral head. Glenohumeral osteoarthritis with spurring and mild joint space narrowing. Mild acromioclavicular spurring with inferiorly directed osteophytes. No bony destruction or evidence of focal bone lesion. No soft tissue calcifications. IMPRESSION: 1. Superior subluxation of the humeral head abutting the undersurface of the acromion, consistent with rotator cuff arthropathy. 2. Glenohumeral and acromioclavicular osteoarthritis. Electronically Signed   By: Keith Rake M.D.   On: 12/22/2019 20:16   I, Lynne Leader, personally (independently) visualized and performed the interpretation of the images attached in this note.    Impression and Recommendations:    Assessment and Plan: 79 y.o. female with left arm pain radiating down the ulnar arm to the fourth and fifth digit and into the anterior chest most consistent with C8 and T1 cervical and thoracic radiculopathy.  She is a positive Spurling's test as well.  This is likely to be somewhat multifactorial as well as she does have evidence of chronic rotator cuff tear and shoulder degeneration.  Plan for course of prednisone and  increasing dose of gabapentin.  Plan also for physical therapy.  If not improving will proceed to MRI for epidural steroid injection planning.Marland Kitchen  PDMP not reviewed this encounter. Orders Placed This Encounter  Procedures  . [place] LIMITED JOINT SPACE STRUCTURES UP LEFT(NO LINKED CHARGES)    Standing Status:   Future    Number of Occurrences:   1    Standing Expiration Date:   06/29/2020    Order Specific Question:   Reason for Exam (SYMPTOM  OR DIAGNOSIS REQUIRED)    Answer:   chronic left shoulder pain    Order Specific Question:   Preferred imaging location?    Answer:   [facility]  . Ambulatory referral to Physical Therapy    Referral Priority:   Routine    Referral Type:   Physical Medicine    Referral Reason:   Specialty Services Required    Requested Specialty:   Physical Therapy   Meds ordered this encounter  Medications  . predniSONE (STERAPRED UNI-PAK 48 TAB) 5 MG (48) TBPK tablet    Sig: 12 day dosepack po    Dispense:  48 tablet    Refill:  0    Discussed warning signs or symptoms. Please see discharge instructions. Patient expresses understanding.   The above documentation has been reviewed and is accurate and complete Lynne Leader, M.D.

## 2019-12-30 ENCOUNTER — Ambulatory Visit: Payer: PPO | Admitting: Family Medicine

## 2019-12-30 ENCOUNTER — Ambulatory Visit: Payer: Self-pay

## 2019-12-30 ENCOUNTER — Other Ambulatory Visit: Payer: Self-pay

## 2019-12-30 VITALS — BP 132/86 | HR 60 | Ht 63.0 in | Wt 196.4 lb

## 2019-12-30 DIAGNOSIS — M5412 Radiculopathy, cervical region: Secondary | ICD-10-CM

## 2019-12-30 DIAGNOSIS — M25512 Pain in left shoulder: Secondary | ICD-10-CM | POA: Diagnosis not present

## 2019-12-30 DIAGNOSIS — G8929 Other chronic pain: Secondary | ICD-10-CM

## 2019-12-30 MED ORDER — PREDNISONE 5 MG (48) PO TBPK: ORAL_TABLET | ORAL | 0 refills | Status: DC

## 2019-12-30 NOTE — Patient Instructions (Addendum)
Thank you for coming in today.  Start the prednisone for 12 days.   Take the gabapentin. Ok to take up to 300mg   (3 pills) at bedtime.   I think physical therapy will also help.   If you do not get better I will do an MRI on your neck so we can see what the problem is and plan for a neck injection.  I've referred you to Physical Therapy.  Let us know if you don't hear from them in one week.  If things are not going well or you have a problem please let me know.   Come back or go to the emergency room if you notice new weakness new numbness problems walking or bowel or bladder problems.

## 2020-01-07 ENCOUNTER — Other Ambulatory Visit: Payer: Self-pay | Admitting: Internal Medicine

## 2020-01-07 ENCOUNTER — Telehealth: Payer: Self-pay | Admitting: Internal Medicine

## 2020-01-07 DIAGNOSIS — Z1231 Encounter for screening mammogram for malignant neoplasm of breast: Secondary | ICD-10-CM

## 2020-01-07 NOTE — Telephone Encounter (Signed)
   Patent reports since taking prednisone she has noticed increased blood sugar(197). She would like to know if metformin needs to be increased

## 2020-01-11 MED ORDER — METFORMIN HCL 500 MG PO TABS: 500.0000 mg | ORAL_TABLET | Freq: Two times a day (BID) | ORAL | 3 refills | Status: DC

## 2020-01-11 NOTE — Telephone Encounter (Signed)
Yes please.  Take Metformin twice a day.  Please see me in 2-4 weeks.  Thanks

## 2020-01-12 NOTE — Telephone Encounter (Signed)
1 with breakfast and one with dinner.  Thanks

## 2020-01-12 NOTE — Telephone Encounter (Signed)
Notified pt w/MD response.../lmb 

## 2020-01-19 ENCOUNTER — Ambulatory Visit: Payer: PPO | Admitting: Physical Therapy

## 2020-01-19 DIAGNOSIS — E119 Type 2 diabetes mellitus without complications: Secondary | ICD-10-CM | POA: Diagnosis not present

## 2020-01-19 DIAGNOSIS — H40011 Open angle with borderline findings, low risk, right eye: Secondary | ICD-10-CM | POA: Diagnosis not present

## 2020-01-19 DIAGNOSIS — H401122 Primary open-angle glaucoma, left eye, moderate stage: Secondary | ICD-10-CM | POA: Diagnosis not present

## 2020-01-19 DIAGNOSIS — H1045 Other chronic allergic conjunctivitis: Secondary | ICD-10-CM | POA: Diagnosis not present

## 2020-01-19 DIAGNOSIS — H21233 Degeneration of iris (pigmentary), bilateral: Secondary | ICD-10-CM | POA: Diagnosis not present

## 2020-01-19 DIAGNOSIS — H16223 Keratoconjunctivitis sicca, not specified as Sjogren's, bilateral: Secondary | ICD-10-CM | POA: Diagnosis not present

## 2020-01-19 DIAGNOSIS — H0288A Meibomian gland dysfunction right eye, upper and lower eyelids: Secondary | ICD-10-CM | POA: Diagnosis not present

## 2020-01-28 ENCOUNTER — Ambulatory Visit: Payer: PPO | Admitting: Internal Medicine

## 2020-02-01 ENCOUNTER — Ambulatory Visit: Payer: PPO | Admitting: Physical Therapy

## 2020-02-01 ENCOUNTER — Encounter: Payer: Self-pay | Admitting: Physical Therapy

## 2020-02-01 ENCOUNTER — Other Ambulatory Visit: Payer: Self-pay

## 2020-02-01 DIAGNOSIS — R600 Localized edema: Secondary | ICD-10-CM | POA: Diagnosis not present

## 2020-02-01 DIAGNOSIS — M25612 Stiffness of left shoulder, not elsewhere classified: Secondary | ICD-10-CM

## 2020-02-01 DIAGNOSIS — M25512 Pain in left shoulder: Secondary | ICD-10-CM

## 2020-02-01 DIAGNOSIS — G8929 Other chronic pain: Secondary | ICD-10-CM

## 2020-02-01 NOTE — Therapy (Signed)
[place], [place], [address] Phone: [phone]   Fax:  [phone]  Physical Therapy Evaluation  Patient Details  Name: Deanna Salazar MRN: [id number] Date of Birth: 20-Oct-1940 Referring Provider (PT): Lynne Leader MD   Encounter Date: 02/01/2020   PT End of Session - 02/01/20 0946    Visit Number 1    Number of Visits 12    Date for PT Re-Evaluation 03/18/20    Progress Note Due on Visit 10    PT Start Time 0934    PT Stop Time 1015    PT Time Calculation (min) 41 min    Activity Tolerance Patient tolerated treatment well    Behavior During Therapy [facility] for tasks assessed/performed           Past Medical History:  Diagnosis Date  . Abdominal pain, generalized 02/05/2008  . ABDOMINAL PAIN-EPIGASTRIC 03/24/2009  . Acute bronchitis 02/15/2007  . Adenomatous colon polyp 04/2000  . Arthritis   . ASCUS favor benign 10/2011   negative high risk HPV screen  . BACK PAIN 07/20/2008  . Breast cancer ([place]) 2009   l, hx of XRT Dr Benay Spice, Left side  . BREAST CANCER, HX OF 04/16/2007  . CHEST PAIN 02/23/2010  . COLONIC POLYPS, ADENOMATOUS, HX OF 10/01/2007  . CONSTIPATION, CHRONIC 05/10/2007  . Dysuria 04/06/2008  . FATIGUE 12/26/2006  . Fatty liver   . FATTY LIVER DISEASE 11/19/2006  . FIBROMYALGIA 11/19/2006  . GERD 11/19/2006   Dr Fuller Plan  . Glaucoma 07/2017   left eye  . HEMATOCHEZIA 05/10/2007  . HEMORRHOIDS, INTERNAL 05/10/2007  . HIP PAIN 07/20/2008  . HYPERLIPIDEMIA 07/26/2009  . HYPERTENSION 12/26/2006  . HYPOKALEMIA 11/25/2007  . HYPOTHYROIDISM 12/26/2006  . IBS 11/19/2006  . INSOMNIA, PERSISTENT 12/26/2006  . KELOID 07/25/2007  . MURMUR 07/25/2007  . Nausea alone 03/23/2010  . PARESTHESIA 04/06/2008  . Personal history of radiation therapy   . RENAL CYST, LEFT 02/23/2010  . Rheumatoid arthritis(714.0) 11/19/2006   Dr Vianne Bulls  . RUQ PAIN 11/07/2009  . Tubular adenoma   . VISION IMPAIRMENT, LOW VISION, ONE  EYE-LEFT 07/25/2007    Past Surgical History:  Procedure Laterality Date  . BREAST LUMPECTOMY Left 2009  . BREAST SURGERY  2009   Left lumpectomy  . COLONOSCOPY  06/12/2012  . Cyst excision from chest    . knee surgery Left   . Left thyroidectomy    . OS Cataract extraction  2009  . OVARIAN CYST REMOVAL  age 56  . SHOULDER SURGERY     both shoulders  . TOE SURGERY    . TUBAL LIGATION      There were no vitals filed for this visit.    Subjective Assessment - 02/01/20 0939    Subjective Pt arriving today for PT evaluation of left shoulder pain. Pt reporting pain is 5/10 at present and worsens at night. Pt reporting she lays on her left side and night and she believes that may have contributed to her pain. Pt reporting limited mobility using her R UE to assist her left.    Pertinent History DM, RA, L shoulder surgery rotator cuff repair, R shoulder surgery, tyroid surgery, left knee surgery, cataracts bilateral surgery, tubes tied, cyst removal    Diagnostic tests X-ray    Patient Stated Goals Stop hurting, be able to move my arm without pain    Currently in Pain? Yes    Pain Score 5     Pain Location  Shoulder    Pain Orientation Right    Pain Descriptors / Indicators Aching    Pain Type Acute pain    Pain Radiating Towards radiation down 4th and 5th digits on L UE    Pain Onset More than a month ago    Pain Frequency Constant    Aggravating Factors  at night, movement    Pain Relieving Factors resting, pain meds,    Effect of Pain on Daily Activities dfficulty with sleeping and ADL's.              [facility] PT Assessment - 02/01/20 0001      Assessment   Medical Diagnosis M25.512, L shoulder pain, M54.12 cervical radiculitis    Referring Provider (PT) Lynne Leader MD    Hand Dominance Right    Prior Therapy yes, years ago after rotator cuff repair and knee surgery      Precautions   Precautions None      Restrictions   Weight Bearing Restrictions No      Balance  Screen   Has the patient fallen in the past 6 months No    Is the patient reluctant to leave their home because of a fear of falling?  No      Home Environment   Living Environment Private residence    Living Arrangements Children   adult daughter   Type of [place] to enter    Entrance Stairs-Number of Steps 1    [place] One level      Prior Function   Level of [place] Retired    Leisure play with great grand children      Cognition   Overall Cognitive Status Within Functional Limits for tasks assessed      Observation/Other Assessments   Focus on Therapeutic Outcomes (FOTO)  36% (predicted 59%)      Posture/Postural Control   Posture/Postural Control Postural limitations    Postural Limitations Forward head;Rounded Shoulders      ROM / Strength   AROM / PROM / Strength AROM;Strength      AROM   AROM Assessment Site Shoulder    Right/Left Shoulder Right;Left    Right Shoulder Extension 62 Degrees    Right Shoulder Flexion 150 Degrees    Right Shoulder ABduction 160 Degrees    Right Shoulder Internal Rotation --   thumb to bra line   Right Shoulder External Rotation 70 Degrees    Left Shoulder Extension 40 Degrees    Left Shoulder Flexion 90 Degrees    Left Shoulder ABduction 102 Degrees    Left Shoulder Internal Rotation --   thumb to bra line with mild pain   Left Shoulder External Rotation 46 Degrees   increased pain     Strength   Overall Strength Comments R grip: 70ppsi, L grip 50ppsi, L bicep 4/5 L tricep: 3/5    Strength Assessment Site Shoulder    Right/Left Shoulder Right;Left    Right Shoulder Flexion 5/5    Right Shoulder Extension 5/5    Right Shoulder Internal Rotation 5/5    Right Shoulder External Rotation 5/5    Left Shoulder Flexion 3/5    Left Shoulder Extension 3/5    Left Shoulder Internal Rotation 3/5    Left Shoulder External Rotation 3/5      Special Tests    Special Tests Rotator  Cuff Impingement    Rotator Cuff Impingment tests Empty Can  test      Empty Can test   Findings Positive    Side Right                      Objective measurements completed on examination: See above findings.               PT Education - 02/01/20 1236    Education Details PT POC, HEP    Person(s) Educated Patient    Methods Explanation;Demonstration;Handout    Comprehension Verbalized understanding;Returned demonstration;Need further instruction            PT Short Term Goals - 02/01/20 0947      PT SHORT TERM GOAL #1   Title Pt will be independent in her HEP.    Time 3    Period Weeks    Status New    Target Date 02/22/20             PT Long Term Goals - 02/01/20 0947      PT LONG TERM GOAL #1   Title Pt will improve her FOTO score to >/= 59 % function.    Baseline 36% function    Time 6    Period Weeks    Status New      PT LONG TERM GOAL #2   Title Pt will be able to lift her arm to performing feeding without pain.    Time 6    Period Weeks    Target Date 03/18/20      PT LONG TERM GOAL #3   Title Pt will be albe lift her L UE  to fix her hair without assistance from the R UE with pain </= 2/10.    Time 6    Period Weeks    Status New      PT LONG TERM GOAL #4   Title Pt will improve her left grip strengh to >/= 60ppsi in order to help with functional mobility and ADL's.    Time 6    Period Weeks    Status New    Target Date 03/18/20                  Plan - 02/01/20 1236    Clinical Impression Statement Pt arriving to therpay complaining of Left shoulder pain and neck pain. Pt presented to MD with + Spurling's test and presented to therapy with + Empty Can. Pt with weakness noted in L soulder of 3/5 and decreased ROM in ER, flexion, extension and abduction. Pt with radiation into 4th and 5th digits of Left UE. Skilled PT needed to address pt's impariments with the below interventions.    Personal Factors and  Comorbidities Age;Comorbidity 3+    Comorbidities DM, RA, L shoulder surgery rotator cuff repair, R shoulder surgery, tyroid surgery, left knee surgery, cataracts bilateral surgery, tubes tied, cyst removal    Examination-Activity Limitations Carry;Dressing;Hygiene/Grooming;Reach Overhead;Sleep    Examination-Participation Restrictions Community Activity;Other;Laundry    Clinical Decision Making Low    Rehab Potential Good    PT Frequency 2x / week    PT Duration 6 weeks    PT Treatment/Interventions ADLs/Self Care Home Management;Cryotherapy;Electrical Stimulation;Iontophoresis 4mg /ml Dexamethasone;Moist Heat;Traction;Ultrasound;Functional mobility training;Therapeutic activities;Therapeutic exercise;Neuromuscular re-education;Patient/family education;Manual techniques;Passive range of motion;Dry needling;Vasopneumatic Device;Taping    PT Next Visit Plan Further access cervical mobility/ROM, shoulder ROM, gentle strengthening    PT Home Exercise Plan Access Code: [email or web address]    Consulted and Agree with Plan of Care Patient  Patient will benefit from skilled therapeutic intervention in order to improve the following deficits and impairments:  Pain,Increased edema,Decreased range of motion,Decreased strength,Impaired UE functional use,Postural dysfunction  Visit Diagnosis: Chronic left shoulder pain  Stiffness of left shoulder, not elsewhere classified  Localized edema     Problem List Patient Active Problem List   Diagnosis Date Noted  . Nightmares 04/22/2019  . Exposure to COVID-19 virus 10/09/2018  . Cough 10/09/2018  . Grief 11/23/2016  . Unilateral primary osteoarthritis, right knee 08/23/2016  . Hypokalemia 05/16/2016  . Rhonchi 02/09/2016  . Intertrigo 10/04/2015  . Allergic rhinitis 04/05/2015  . Well adult exam 06/28/2014  . Tinea pedis 10/13/2013  . Near syncope 09/28/2013  . LBP (low back pain) 09/24/2012  . Diabetes type 2, controlled ([place]) 11/06/2011  .  Breast cancer ([place]) 11/06/2011  . URI, acute 03/02/2011  . Flank pain 03/02/2011  . Hyperglycemia 03/02/2011  . Edema 10/30/2010  . Dyspnea and respiratory abnormalities 10/30/2010  . Cramp in limb 10/30/2010  . Bronchitis 10/06/2010  . NAUSEA ALONE 03/23/2010  . RENAL CYST, LEFT 02/23/2010  . Chest pain 02/23/2010  . Dyslipidemia 07/26/2009  . ABDOMINAL PAIN-EPIGASTRIC 03/24/2009  . HIP PAIN 07/20/2008  . BACK PAIN 07/20/2008  . FEVER, NOS 07/20/2008  . PARESTHESIA 04/06/2008  . Dysuria 04/06/2008  . Dysphagia, pharyngoesophageal phase 02/05/2008  . ABDOMINAL PAIN, GENERALIZED 02/05/2008  . HYPOKALEMIA 11/25/2007  . Depression 11/25/2007  . COLONIC POLYPS, ADENOMATOUS, HX OF 10/01/2007  . 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 KELOID 07/25/2007  . MURMUR 07/25/2007  . HEMORRHOIDS, INTERNAL 05/10/2007  . CONSTIPATION, CHRONIC 05/10/2007  . BREAST CANCER, HX OF 04/16/2007  . ACUTE BRONCHITIS 02/15/2007  . Hypothyroidism 12/26/2006  . INSOMNIA, PERSISTENT 12/26/2006  . Essential hypertension 12/26/2006  . Chronic fatigue 12/26/2006  . GERD 11/19/2006  . IBS 11/19/2006  . FATTY LIVER DISEASE 11/19/2006  . Rheumatoid arthritis ([place]) 11/19/2006  . Fibromyalgia 11/19/2006    Oretha Caprice PT, MPT 02/01/2020, 12:43 PM  [facility] Physical Therapy [address] [place], [place], [address] Phone: [phone]   Fax:  [phone]  Name: VAIBHAVI CZAPLEWSKI MRN: [id number] Date of Birth: 02/07/1940

## 2020-02-01 NOTE — Patient Instructions (Signed)
Access Code: [email or web address] URL: [email or web address] Date: 02/01/2020 Prepared by: Kearney Hard  Exercises Supine Shoulder Flexion Extension AAROM with Dowel - 2-3 x daily - 7 x weekly - 2 sets - 10 reps - 3-5 seconds hold Supine Shoulder External Rotation in 45 Degrees Abduction AAROM with Dowel - 2-3 x daily - 7 x weekly - 2 sets - 10 reps - 3-5 seconds hold Seated Shoulder Flexion Towel Slide at Table Top - 2-3 x daily - 7 x weekly - 2 sets - 10 reps - 3-5 seconds hold Seated Shoulder External Rotation PROM on Table - 2-3 x daily - 7 x weekly - 2 sets - 10 reps

## 2020-02-08 ENCOUNTER — Encounter: Payer: Self-pay | Admitting: Physical Therapy

## 2020-02-08 ENCOUNTER — Other Ambulatory Visit: Payer: Self-pay

## 2020-02-08 ENCOUNTER — Ambulatory Visit: Payer: PPO | Admitting: Physical Therapy

## 2020-02-08 DIAGNOSIS — M25612 Stiffness of left shoulder, not elsewhere classified: Secondary | ICD-10-CM

## 2020-02-08 DIAGNOSIS — G8929 Other chronic pain: Secondary | ICD-10-CM | POA: Diagnosis not present

## 2020-02-08 DIAGNOSIS — M25512 Pain in left shoulder: Secondary | ICD-10-CM

## 2020-02-08 DIAGNOSIS — R600 Localized edema: Secondary | ICD-10-CM

## 2020-02-08 NOTE — Therapy (Signed)
[place], [place], [address] Phone: [phone]   Fax:  [phone]  Physical Therapy Treatment  Patient Details  Name: Deanna Salazar MRN: [id number] Date of Birth: 02/01/1940 Referring Provider (PT): Lynne Leader MD   Encounter Date: 02/08/2020   PT End of Session - 02/08/20 1035    Visit Number 2    Number of Visits 12    Date for PT Re-Evaluation 03/18/20    Progress Note Due on Visit 10    PT Start Time 1018    PT Stop Time 1100    PT Time Calculation (min) 42 min    Activity Tolerance Patient tolerated treatment well    Behavior During Therapy [facility] for tasks assessed/performed           Past Medical History:  Diagnosis Date  . Abdominal pain, generalized 02/05/2008  . ABDOMINAL PAIN-EPIGASTRIC 03/24/2009  . Acute bronchitis 02/15/2007  . Adenomatous colon polyp 04/2000  . Arthritis   . ASCUS favor benign 10/2011   negative high risk HPV screen  . BACK PAIN 07/20/2008  . Breast cancer ([place]) 2009   l, hx of XRT Dr Benay Spice, Left side  . BREAST CANCER, HX OF 04/16/2007  . CHEST PAIN 02/23/2010  . COLONIC POLYPS, ADENOMATOUS, HX OF 10/01/2007  . CONSTIPATION, CHRONIC 05/10/2007  . Dysuria 04/06/2008  . FATIGUE 12/26/2006  . Fatty liver   . FATTY LIVER DISEASE 11/19/2006  . FIBROMYALGIA 11/19/2006  . GERD 11/19/2006   Dr Fuller Plan  . Glaucoma 07/2017   left eye  . HEMATOCHEZIA 05/10/2007  . HEMORRHOIDS, INTERNAL 05/10/2007  . HIP PAIN 07/20/2008  . HYPERLIPIDEMIA 07/26/2009  . HYPERTENSION 12/26/2006  . HYPOKALEMIA 11/25/2007  . HYPOTHYROIDISM 12/26/2006  . IBS 11/19/2006  . INSOMNIA, PERSISTENT 12/26/2006  . KELOID 07/25/2007  . MURMUR 07/25/2007  . Nausea alone 03/23/2010  . PARESTHESIA 04/06/2008  . Personal history of radiation therapy   . RENAL CYST, LEFT 02/23/2010  . Rheumatoid arthritis(714.0) 11/19/2006   Dr Vianne Bulls  . RUQ PAIN 11/07/2009  . Tubular adenoma   . VISION IMPAIRMENT, LOW VISION, ONE  EYE-LEFT 07/25/2007    Past Surgical History:  Procedure Laterality Date  . BREAST LUMPECTOMY Left 2009  . BREAST SURGERY  2009   Left lumpectomy  . COLONOSCOPY  06/12/2012  . Cyst excision from chest    . knee surgery Left   . Left thyroidectomy    . OS Cataract extraction  2009  . OVARIAN CYST REMOVAL  age 80  . SHOULDER SURGERY     both shoulders  . TOE SURGERY    . TUBAL LIGATION      There were no vitals filed for this visit.   Subjective Assessment - 02/08/20 1029    Subjective Pt arriving to therapy reporting 5/10 pain in L shoulder. Pt reporting her pain is worse at night 9/10. Pt reporting she tried taking gabapentin, but it gives her nightmares. Pt reproting she has been trying to take it once daily for pain.    Pertinent History DM, RA, L shoulder surgery rotator cuff repair, R shoulder surgery, tyroid surgery, left knee surgery, cataracts bilateral surgery, tubes tied, cyst removal    Diagnostic tests X-ray    Patient Stated Goals Stop hurting, be able to move my arm without pain    Currently in Pain? Yes    Pain Score 5     Pain Location Shoulder    Pain Orientation Left    Pain  Descriptors / Indicators Aching    Pain Type Acute pain    Pain Onset More than a month ago    Multiple Pain Sites No                             [facility] Adult PT Treatment/Exercise - 02/08/20 0001      Exercises   Exercises Shoulder      Shoulder Exercises: Supine   Protraction Left;10 reps    External Rotation AAROM;Left;20 reps    Flexion AAROM;Both;20 reps    Flexion Limitations 1# bar    Other Supine Exercises serratus punches with L UE 2x10      Shoulder Exercises: Seated   Other Seated Exercises table slides x 30 flexion    Other Seated Exercises shoulder depression holding 3 seconds x 5      Modalities   Modalities Cryotherapy      Cryotherapy   Number Minutes Cryotherapy 4 Minutes    Cryotherapy Location Shoulder    Type of Cryotherapy Ice pack       Manual Therapy   Manual therapy comments PROM L shoulder, Grade 2-3 AP mobs to [facility] joint,                  PT Education - 02/08/20 1033    Education Details Advised pt if she couldn't take the gabapentin the dose the MD recommended that she should call her MD to let him know. I also suggested taking an over the counter medication before bedtime to help with pain at bedtime.    Person(s) Educated Patient    Methods Explanation    Comprehension Verbalized understanding            PT Short Term Goals - 02/08/20 1039      PT SHORT TERM GOAL #1   Title Pt will be independent in her HEP.    Status On-going             PT Long Term Goals - 02/08/20 1040      PT LONG TERM GOAL #1   Title Pt will improve her FOTO score to >/= 59 % function.    Status On-going      PT LONG TERM GOAL #2   Title Pt will be able to lift her arm to performing feeding without pain.    Status On-going      PT LONG TERM GOAL #3   Title Pt will be albe lift her L UE  to fix her hair without assistance from the R UE with pain </= 2/10.    Status On-going      PT LONG TERM GOAL #4   Title Pt will improve her left grip strengh to >/= 60ppsi in order to help with functional mobility and ADL's.    Status On-going                 Plan - 02/08/20 1036    Clinical Impression Statement Pt arriving to therapy reporting 5/10 left shoulder pain. Pt tolerating execises well with mild pain reported at end ranges of flexion and ER. Pt reporting inability to sleep at night due to pain and I recommended pt trying an over the counter pain medicine that she can take to help take the edge off before going to bed. Pt was also instructed that if Tylenol didn't help that she should contact Dr. Georgina Snell for his recommendations. Continue skilled PT as  pt tolerates.    Personal Factors and Comorbidities Age;Comorbidity 3+    Comorbidities DM, RA, L shoulder surgery rotator cuff repair, R shoulder surgery,  tyroid surgery, left knee surgery, cataracts bilateral surgery, tubes tied, cyst removal    Examination-Activity Limitations Carry;Dressing;Hygiene/Grooming;Reach Overhead;Sleep    Examination-Participation Restrictions Community Activity;Other;Laundry    Rehab Potential Good    PT Frequency 2x / week    PT Duration 6 weeks    PT Treatment/Interventions ADLs/Self Care Home Management;Cryotherapy;Electrical Stimulation;Iontophoresis 4mg /ml Dexamethasone;Moist Heat;Traction;Ultrasound;Functional mobility training;Therapeutic activities;Therapeutic exercise;Neuromuscular re-education;Patient/family education;Manual techniques;Passive range of motion;Dry needling;Vasopneumatic Device;Taping    PT Next Visit Plan Further access cervical mobility/ROM, shoulder ROM, gentle strengthening    PT Home Exercise Plan Access Code: [email or web address]    Consulted and Agree with Plan of Care Patient           Patient will benefit from skilled therapeutic intervention in order to improve the following deficits and impairments:  Pain,Increased edema,Decreased range of motion,Decreased strength,Impaired UE functional use,Postural dysfunction  Visit Diagnosis: Chronic left shoulder pain  Stiffness of left shoulder, not elsewhere classified  Localized edema     Problem List Patient Active Problem List   Diagnosis Date Noted  . Nightmares 04/22/2019  . Exposure to COVID-19 virus 10/09/2018  . Cough 10/09/2018  . Grief 11/23/2016  . Unilateral primary osteoarthritis, right knee 08/23/2016  . Hypokalemia 05/16/2016  . Rhonchi 02/09/2016  . Intertrigo 10/04/2015  . Allergic rhinitis 04/05/2015  . Well adult exam 06/28/2014  . Tinea pedis 10/13/2013  . Near syncope 09/28/2013  . LBP (low back pain) 09/24/2012  . Diabetes type 2, controlled ([place]) 11/06/2011  . Breast cancer ([place]) 11/06/2011  . URI, acute 03/02/2011  . Flank pain 03/02/2011  . Hyperglycemia 03/02/2011  . Edema 10/30/2010  . Dyspnea and  respiratory abnormalities 10/30/2010  . Cramp in limb 10/30/2010  . Bronchitis 10/06/2010  . NAUSEA ALONE 03/23/2010  . RENAL CYST, LEFT 02/23/2010  . Chest pain 02/23/2010  . Dyslipidemia 07/26/2009  . ABDOMINAL PAIN-EPIGASTRIC 03/24/2009  . HIP PAIN 07/20/2008  . BACK PAIN 07/20/2008  . FEVER, NOS 07/20/2008  . PARESTHESIA 04/06/2008  . Dysuria 04/06/2008  . Dysphagia, pharyngoesophageal phase 02/05/2008  . ABDOMINAL PAIN, GENERALIZED 02/05/2008  . HYPOKALEMIA 11/25/2007  . Depression 11/25/2007  . COLONIC POLYPS, ADENOMATOUS, HX OF 10/01/2007  . 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 KELOID 07/25/2007  . MURMUR 07/25/2007  . HEMORRHOIDS, INTERNAL 05/10/2007  . CONSTIPATION, CHRONIC 05/10/2007  . BREAST CANCER, HX OF 04/16/2007  . ACUTE BRONCHITIS 02/15/2007  . Hypothyroidism 12/26/2006  . INSOMNIA, PERSISTENT 12/26/2006  . Essential hypertension 12/26/2006  . Chronic fatigue 12/26/2006  . GERD 11/19/2006  . IBS 11/19/2006  . FATTY LIVER DISEASE 11/19/2006  . Rheumatoid arthritis ([place]) 11/19/2006  . Fibromyalgia 11/19/2006    Oretha Caprice, PT, MPT 02/08/2020, 10:58 AM  [facility] [address] [place], [place], [address] Phone: [phone]   Fax:  [phone]  Name: LASHIRA DEFARIA MRN: [id number] Date of Birth: 12-Dec-1940

## 2020-02-09 DIAGNOSIS — E669 Obesity, unspecified: Secondary | ICD-10-CM | POA: Diagnosis not present

## 2020-02-09 DIAGNOSIS — E119 Type 2 diabetes mellitus without complications: Secondary | ICD-10-CM | POA: Diagnosis not present

## 2020-02-09 DIAGNOSIS — R600 Localized edema: Secondary | ICD-10-CM | POA: Diagnosis not present

## 2020-02-09 DIAGNOSIS — M1711 Unilateral primary osteoarthritis, right knee: Secondary | ICD-10-CM | POA: Diagnosis not present

## 2020-02-09 DIAGNOSIS — M0609 Rheumatoid arthritis without rheumatoid factor, multiple sites: Secondary | ICD-10-CM | POA: Diagnosis not present

## 2020-02-09 DIAGNOSIS — M25561 Pain in right knee: Secondary | ICD-10-CM | POA: Diagnosis not present

## 2020-02-09 DIAGNOSIS — Z6834 Body mass index (BMI) 34.0-34.9, adult: Secondary | ICD-10-CM | POA: Diagnosis not present

## 2020-02-09 DIAGNOSIS — M2550 Pain in unspecified joint: Secondary | ICD-10-CM | POA: Diagnosis not present

## 2020-02-10 ENCOUNTER — Encounter: Payer: Self-pay | Admitting: Physical Therapy

## 2020-02-10 ENCOUNTER — Other Ambulatory Visit: Payer: Self-pay

## 2020-02-10 ENCOUNTER — Telehealth: Payer: Self-pay | Admitting: Family Medicine

## 2020-02-10 ENCOUNTER — Ambulatory Visit: Payer: PPO | Admitting: Physical Therapy

## 2020-02-10 DIAGNOSIS — M25612 Stiffness of left shoulder, not elsewhere classified: Secondary | ICD-10-CM | POA: Diagnosis not present

## 2020-02-10 DIAGNOSIS — M25512 Pain in left shoulder: Secondary | ICD-10-CM

## 2020-02-10 DIAGNOSIS — G8929 Other chronic pain: Secondary | ICD-10-CM

## 2020-02-10 DIAGNOSIS — R600 Localized edema: Secondary | ICD-10-CM

## 2020-02-10 NOTE — Telephone Encounter (Signed)
Patient called stating that the Gabapentin that was prescribed gives her nightmares. She asked if there was anything else she could take a night to help with the pain? She generally only needs something on the days she has therapy.  Please advise.

## 2020-02-10 NOTE — Therapy (Signed)
[place], [place], [address] Phone: [phone]   Fax:  [phone]  Physical Therapy Treatment  Patient Details  Name: Deanna Salazar MRN: [id number] Date of Birth: [birth year] Referring Provider (PT): Lynne Leader MD   Encounter Date: 02/10/2020   PT End of Session - 02/10/20 0951    Visit Number 3    Number of Visits 12    Date for PT Re-Evaluation 03/18/20    Progress Note Due on Visit 10    PT Start Time 0932    PT Stop Time 1015    PT Time Calculation (min) 43 min    Activity Tolerance Patient tolerated treatment well    Behavior During Therapy [facility] for tasks assessed/performed           Past Medical History:  Diagnosis Date  . Abdominal pain, generalized 02/05/2008  . ABDOMINAL PAIN-EPIGASTRIC 03/24/2009  . Acute bronchitis 02/15/2007  . Adenomatous colon polyp 04/2000  . Arthritis   . ASCUS favor benign 10/2011   negative high risk HPV screen  . BACK PAIN 07/20/2008  . Breast cancer ([place]) 2009   l, hx of XRT Dr Benay Spice, Left side  . BREAST CANCER, HX OF 04/16/2007  . CHEST PAIN 02/23/2010  . COLONIC POLYPS, ADENOMATOUS, HX OF 10/01/2007  . CONSTIPATION, CHRONIC 05/10/2007  . Dysuria 04/06/2008  . FATIGUE 12/26/2006  . Fatty liver   . FATTY LIVER DISEASE 11/19/2006  . FIBROMYALGIA 11/19/2006  . GERD 11/19/2006   Dr Fuller Plan  . Glaucoma 07/2017   left eye  . HEMATOCHEZIA 05/10/2007  . HEMORRHOIDS, INTERNAL 05/10/2007  . HIP PAIN 07/20/2008  . HYPERLIPIDEMIA 07/26/2009  . HYPERTENSION 12/26/2006  . HYPOKALEMIA 11/25/2007  . HYPOTHYROIDISM 12/26/2006  . IBS 11/19/2006  . INSOMNIA, PERSISTENT 12/26/2006  . KELOID 07/25/2007  . MURMUR 07/25/2007  . Nausea alone 03/23/2010  . PARESTHESIA 04/06/2008  . Personal history of radiation therapy   . RENAL CYST, LEFT 02/23/2010  . Rheumatoid arthritis(714.0) 11/19/2006   Dr Vianne Bulls  . RUQ PAIN 11/07/2009  . Tubular adenoma   . VISION IMPAIRMENT, LOW VISION, ONE EYE-LEFT  07/25/2007    Past Surgical History:  Procedure Laterality Date  . BREAST LUMPECTOMY Left 2009  . BREAST SURGERY  2009   Left lumpectomy  . COLONOSCOPY  06/12/2012  . Cyst excision from chest    . knee surgery Left   . Left thyroidectomy    . OS Cataract extraction  2009  . OVARIAN CYST REMOVAL  age 80  . SHOULDER SURGERY     both shoulders  . TOE SURGERY    . TUBAL LIGATION      There were no vitals filed for this visit.   Subjective Assessment - 02/10/20 0941    Subjective Pt arriving to therapy reprorting 4/10 pain in left shoulder. Pt reporting she was able to reach her mouth when drinking a bottle of water yesterday. Pt also reported she was able to wash her hair with only mild pain.    Pertinent History DM, RA, L shoulder surgery rotator cuff repair, R shoulder surgery, tyroid surgery, left knee surgery, cataracts bilateral surgery, tubes tied, cyst removal    Diagnostic tests X-ray    Patient Stated Goals Stop hurting, be able to move my arm without pain    Currently in Pain? Yes    Pain Score 4     Pain Location Shoulder    Pain Orientation Left    Pain Descriptors / Indicators  Aching    Pain Type Acute pain    Pain Onset More than a month ago    Pain Frequency Constant                             [facility] Adult PT Treatment/Exercise - 02/10/20 0001      Shoulder Exercises: Supine   Protraction Left;10 reps    External Rotation AAROM;Left;20 reps    Flexion AAROM;Both;20 reps    Flexion Limitations 1# bar    Other Supine Exercises serratus punches with L UE 2x10   using 1# bar with bilateral UE's     Shoulder Exercises: Seated   Other Seated Exercises UE ranger flexion x 20    Other Seated Exercises shoulder depression holding 3 seconds x 5      Shoulder Exercises: Pulleys   Flexion 2 minutes    Scaption 2 minutes      Shoulder Exercises: Isometric Strengthening   Other Isometric Exercises ball squeezes with bilateral UE's x 10 holding 5  seconds      Modalities   Modalities Cryotherapy      Cryotherapy   Number Minutes Cryotherapy 5 Minutes    Cryotherapy Location Shoulder    Type of Cryotherapy Ice pack      Manual Therapy   Manual therapy comments PROM L shoulder, Grade 2-3 AP mobs to [facility] joint,                  PT Education - 02/10/20 0947    Education Details Exercises techniques    Person(s) Educated Patient    Methods Explanation;Demonstration;Verbal cues;Tactile cues    Comprehension Verbalized understanding;Returned demonstration            PT Short Term Goals - 02/10/20 0954      PT SHORT TERM GOAL #1   Title Pt will be independent in her HEP.    Time 3    Period Weeks    Status On-going             PT Long Term Goals - 02/08/20 1040      PT LONG TERM GOAL #1   Title Pt will improve her FOTO score to >/= 59 % function.    Status On-going      PT LONG TERM GOAL #2   Title Pt will be able to lift her arm to performing feeding without pain.    Status On-going      PT LONG TERM GOAL #3   Title Pt will be albe lift her L UE  to fix her hair without assistance from the R UE with pain </= 2/10.    Status On-going      PT LONG TERM GOAL #4   Title Pt will improve her left grip strengh to >/= 60ppsi in order to help with functional mobility and ADL's.    Status On-going                 Plan - 02/10/20 4503    Clinical Impression Statement Pt reporting improved ROM with ADL's at home since starting her home exericses. Pt tolerating exercises well today with correction for technique and form. Pt reporting mild pain with end ranges of PROM with flexion and rotation. Pt still reporting difficulty with sleeping at night and having to get up and ice her shoulder. Pt was given Dr. Clovis Riley number to call to left his office know about inability to  take full dose of gabapentin and asking if there was anything else she could take. No goals met this session. Continue skilled PT.     Personal Factors and Comorbidities Age;Comorbidity 3+    Comorbidities DM, RA, L shoulder surgery rotator cuff repair, R shoulder surgery, tyroid surgery, left knee surgery, cataracts bilateral surgery, tubes tied, cyst removal    Examination-Activity Limitations Carry;Dressing;Hygiene/Grooming;Reach Overhead;Sleep    Examination-Participation Restrictions Community Activity;Other;Laundry    Rehab Potential Good    PT Frequency 2x / week    PT Duration 6 weeks    PT Treatment/Interventions ADLs/Self Care Home Management;Cryotherapy;Electrical Stimulation;Iontophoresis 16m/ml Dexamethasone;Moist Heat;Traction;Ultrasound;Functional mobility training;Therapeutic activities;Therapeutic exercise;Neuromuscular re-education;Patient/family education;Manual techniques;Passive range of motion;Dry needling;Vasopneumatic Device;Taping    PT Home Exercise Plan Access Code: [email or web address]    Consulted and Agree with Plan of Care Patient           Patient will benefit from skilled therapeutic intervention in order to improve the following deficits and impairments:  Pain,Increased edema,Decreased range of motion,Decreased strength,Impaired UE functional use,Postural dysfunction  Visit Diagnosis: Chronic left shoulder pain  Stiffness of left shoulder, not elsewhere classified  Localized edema     Problem List Patient Active Problem List   Diagnosis Date Noted  . Nightmares 04/22/2019  . Exposure to COVID-19 virus 10/09/2018  . Cough 10/09/2018  . Grief 11/23/2016  . Unilateral primary osteoarthritis, right knee 08/23/2016  . Hypokalemia 05/16/2016  . Rhonchi 02/09/2016  . Intertrigo 10/04/2015  . Allergic rhinitis 04/05/2015  . Well adult exam 06/28/2014  . Tinea pedis 10/13/2013  . Near syncope 09/28/2013  . LBP (low back pain) 09/24/2012  . [place] 11/06[place]  . [place] 11/06[place]  . URI, acute 03/02/2011  . Flank pain 03/02/2011  . Hyperglycemia  03/02/2011  . Edema 10/30/2010  . Dyspnea and respiratory abnormalities 10/30/2010  . Cramp in limb 10/30/2010  . Bronchitis 10/06/2010  . NAUSEA ALONE 03/23/2010  . RENAL CYST, LEFT 02/23/2010  . Chest pain 02/23/2010  . Dyslipidemia 07/26/2009  . ABDOMINAL PAIN-EPIGASTRIC 03/24/2009  . HIP PAIN 07/20/2008  . BACK PAIN 07/20/2008  . FEVER, NOS 07/20/2008  . PARESTHESIA 04/06/2008  . Dysuria 04/06/2008  . Dysphagia, pharyngoesophageal phase 02/05/2008  . ABDOMINAL PAIN, GENERALIZED 02/05/2008  . HYPOKALEMIA 11/25/2007  . Depression 11/25/2007  . COLONIC POLYPS, ADENOMATOUS, HX OF 10/01/2007  . 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 KELOID 07/25/2007  . MURMUR 07/25/2007  . HEMORRHOIDS, INTERNAL 05/10/2007  . CONSTIPATION, CHRONIC 05/10/2007  . BREAST CANCER, HX OF 04/16/2007  . ACUTE BRONCHITIS 02/15/2007  . Hypothyroidism 12/26/2006  . INSOMNIA, PERSISTENT 12/26/2006  . Essential hypertension 12/26/2006  . Chronic fatigue 12/26/2006  . GERD 11/19/2006  . IBS 11/19/2006  . FATTY LIVER DISEASE 11/19/2006  . [place] 11/19/2006  . Fibromyalgia 11/19/2006    JOretha Caprice PT, MPT 02/10/2020, 10:03 AM  [facility] [address] [address]Phone: [phone]  Fax:  [phone] Name: ETALIYAH WATROUSMRN: [id number] of Birth: [birth year]

## 2020-02-11 MED ORDER — PREGABALIN 25 MG PO CAPS: 25.0000 mg | ORAL_CAPSULE | Freq: Two times a day (BID) | ORAL | 1 refills | Status: DC | PRN

## 2020-02-11 NOTE — Telephone Encounter (Signed)
Is there a different option other than Gabapentin?

## 2020-02-11 NOTE — Telephone Encounter (Signed)
Called pt and relayed Dr. Clovis Riley info regarding stopping the Gabapentin and switching to Lyrica.  Pt verbalizes understanding.

## 2020-02-11 NOTE — Telephone Encounter (Signed)
STOP gabapentin.   Lets try low dose Lyrica.  Take 1-2 pills up to 2x daily as needed for pain.   It can also make you sleepy just like Gabapentin.

## 2020-02-12 ENCOUNTER — Other Ambulatory Visit: Payer: Self-pay

## 2020-02-15 ENCOUNTER — Ambulatory Visit: Payer: PPO

## 2020-02-15 ENCOUNTER — Ambulatory Visit: Payer: PPO | Admitting: Internal Medicine

## 2020-02-16 ENCOUNTER — Encounter: Payer: PPO | Admitting: Physical Therapy

## 2020-02-17 ENCOUNTER — Ambulatory Visit: Payer: PPO

## 2020-02-22 ENCOUNTER — Encounter: Payer: Self-pay | Admitting: Physical Therapy

## 2020-02-22 ENCOUNTER — Ambulatory Visit (INDEPENDENT_AMBULATORY_CARE_PROVIDER_SITE_OTHER): Payer: PPO | Admitting: Physical Therapy

## 2020-02-22 ENCOUNTER — Other Ambulatory Visit: Payer: Self-pay

## 2020-02-22 DIAGNOSIS — G8929 Other chronic pain: Secondary | ICD-10-CM

## 2020-02-22 DIAGNOSIS — M25612 Stiffness of left shoulder, not elsewhere classified: Secondary | ICD-10-CM

## 2020-02-22 DIAGNOSIS — R600 Localized edema: Secondary | ICD-10-CM | POA: Diagnosis not present

## 2020-02-22 DIAGNOSIS — M25512 Pain in left shoulder: Secondary | ICD-10-CM | POA: Diagnosis not present

## 2020-02-22 NOTE — Therapy (Signed)
[facility] [place], [place], [address] Phone: [phone]   Fax:  [phone]  Physical Therapy Treatment  Patient Details  Name: Deanna Salazar MRN: [id number] Date of Birth: Oct 10, 1940 Referring Provider (PT): Lynne Leader MD   Encounter Date: 02/22/2020   PT End of Session - 02/22/20 0957    Visit Number 4    Number of Visits 12    Date for PT Re-Evaluation 03/18/20    Progress Note Due on Visit 10    PT Start Time 0930    PT Stop Time 1015    PT Time Calculation (min) 45 min    Activity Tolerance Patient tolerated treatment well    Behavior During Therapy [facility] for tasks assessed/performed           Past Medical History:  Diagnosis Date  . Abdominal pain, generalized 02/05/2008  . ABDOMINAL PAIN-EPIGASTRIC 03/24/2009  . Acute bronchitis 02/15/2007  . Adenomatous colon polyp 04/2000  . Arthritis   . ASCUS favor benign 10/2011   negative high risk HPV screen  . BACK PAIN 07/20/2008  . Breast cancer ([place]) 2009   l, hx of XRT Dr Benay Spice, Left side  . BREAST CANCER, HX OF 04/16/2007  . CHEST PAIN 02/23/2010  . COLONIC POLYPS, ADENOMATOUS, HX OF 10/01/2007  . CONSTIPATION, CHRONIC 05/10/2007  . Dysuria 04/06/2008  . FATIGUE 12/26/2006  . Fatty liver   . FATTY LIVER DISEASE 11/19/2006  . FIBROMYALGIA 11/19/2006  . GERD 11/19/2006   Dr Fuller Plan  . Glaucoma 07/2017   left eye  . HEMATOCHEZIA 05/10/2007  . HEMORRHOIDS, INTERNAL 05/10/2007  . HIP PAIN 07/20/2008  . HYPERLIPIDEMIA 07/26/2009  . HYPERTENSION 12/26/2006  . HYPOKALEMIA 11/25/2007  . HYPOTHYROIDISM 12/26/2006  . IBS 11/19/2006  . INSOMNIA, PERSISTENT 12/26/2006  . KELOID 07/25/2007  . MURMUR 07/25/2007  . Nausea alone 03/23/2010  . PARESTHESIA 04/06/2008  . Personal history of radiation therapy   . RENAL CYST, LEFT 02/23/2010  . Rheumatoid arthritis(714.0) 11/19/2006   Dr Vianne Bulls  . RUQ PAIN 11/07/2009  . Tubular adenoma   . VISION IMPAIRMENT, LOW VISION, ONE  EYE-LEFT 07/25/2007    Past Surgical History:  Procedure Laterality Date  . BREAST LUMPECTOMY Left 2009  . BREAST SURGERY  2009   Left lumpectomy  . COLONOSCOPY  06/12/2012  . Cyst excision from chest    . knee surgery Left   . Left thyroidectomy    . OS Cataract extraction  2009  . OVARIAN CYST REMOVAL  age 89  . SHOULDER SURGERY     both shoulders  . TOE SURGERY    . TUBAL LIGATION      There were no vitals filed for this visit.   Subjective Assessment - 02/22/20 0927    Subjective Pt arriving to therapy reprorting 4/10 pain in left shoulder. Pt pointing to anterior shoulder to pin point pain.    Pertinent History DM, RA, L shoulder surgery rotator cuff repair, R shoulder surgery, tyroid surgery, left knee surgery, cataracts bilateral surgery, tubes tied, cyst removal    Diagnostic tests X-ray    Patient Stated Goals Stop hurting, be able to move my arm without pain    Currently in Pain? Yes    Pain Score 4     Pain Location Shoulder    Pain Orientation Left    Pain Descriptors / Indicators Aching    Pain Type Acute pain    Pain Onset More than a month ago  Pain Frequency Constant              [facility] PT Assessment - 02/22/20 0001      Assessment   Medical Diagnosis M25.512, L shoulder pain, M54.12 cervical radiculitis    Referring Provider (PT) Lynne Leader MD    Hand Dominance Right      Precautions   Precautions None      AROM   Left Shoulder Extension 54 Degrees    Left Shoulder Flexion 140 Degrees    Left Shoulder ABduction 142 Degrees    Left Shoulder External Rotation 68 Degrees      Strength   Left Shoulder Flexion 3+/5    Left Shoulder Extension 3+/5    Left Shoulder ABduction 3+/5    Left Shoulder Internal Rotation 3+/5    Left Shoulder External Rotation 3+/5                         [facility] Adult PT Treatment/Exercise - 02/22/20 0001      Shoulder Exercises: Supine   Protraction Left;10 reps    External Rotation AAROM;Left;20  reps    Flexion AAROM;Both;20 reps    Flexion Limitations 2# bar    Other Supine Exercises serratus punches with L UE 2x10   using 1# bar with bilateral UE's     Shoulder Exercises: Seated   Other Seated Exercises UE ranger flexion x 20    Other Seated Exercises shoulder depression holding 3 seconds x 5      Shoulder Exercises: Pulleys   Flexion 2 minutes    Scaption 2 minutes      Shoulder Exercises: Isometric Strengthening   Other Isometric Exercises ball squeezes with bilateral UE's x 10 holding 5 seconds      Modalities   Modalities Cryotherapy      Cryotherapy   Number Minutes Cryotherapy 5 Minutes    Cryotherapy Location Shoulder    Type of Cryotherapy Ice pack      Manual Therapy   Manual therapy comments PROM L shoulder                    PT Short Term Goals - 02/22/20 0954      PT SHORT TERM GOAL #1   Title Pt will be independent in her HEP.    Status On-going             PT Long Term Goals - 02/22/20 0954      PT LONG TERM GOAL #1   Title Pt will improve her FOTO score to >/= 59 % function.    Status On-going      PT LONG TERM GOAL #2   Title Pt will be able to lift her arm to performing feeding without pain.    Status On-going      PT LONG TERM GOAL #3   Title Pt will be albe lift her L UE  to fix her hair without assistance from the R UE with pain </= 2/10.    Status On-going      PT LONG TERM GOAL #4   Title Pt will improve her left grip strengh to >/= 60ppsi in order to help with functional mobility and ADL's.    Status On-going                 Plan - 02/22/20 0949    Clinical Impression Statement Pt arriving today reporting 4/10 pain in her L anterior shoulder. Pt  reporting compliance with her HEP. Pt with mild fatigue reported and incresaed pain with end range flexion , ER and abduction. Pt reported that she called Dr. Georgina Snell about getting a new medication to help with her pain and she feels it has helped and she is not  having the nighmares like with the gabapentin. Continue skilled PT to progress toward goals set.    Personal Factors and Comorbidities Age;Comorbidity 3+    Comorbidities DM, RA, L shoulder surgery rotator cuff repair, R shoulder surgery, tyroid surgery, left knee surgery, cataracts bilateral surgery, tubes tied, cyst removal    Examination-Activity Limitations Carry;Dressing;Hygiene/Grooming;Reach Overhead;Sleep    Clinical Decision Making Low    PT Frequency 2x / week    PT Duration 6 weeks    PT Treatment/Interventions ADLs/Self Care Home Management;Cryotherapy;Electrical Stimulation;Iontophoresis 4mg /ml Dexamethasone;Moist Heat;Traction;Ultrasound;Functional mobility training;Therapeutic activities;Therapeutic exercise;Neuromuscular re-education;Patient/family education;Manual techniques;Passive range of motion;Dry needling;Vasopneumatic Device;Taping    PT Next Visit Plan Further access cervical mobility/ROM, shoulder ROM, gentle strengthening    PT Home Exercise Plan Access Code: [email or web address]    Consulted and Agree with Plan of Care Patient           Patient will benefit from skilled therapeutic intervention in order to improve the following deficits and impairments:  Pain,Increased edema,Decreased range of motion,Decreased strength,Impaired UE functional use,Postural dysfunction  Visit Diagnosis: Chronic left shoulder pain  Stiffness of left shoulder, not elsewhere classified  Localized edema     Problem List Patient Active Problem List   Diagnosis Date Noted  . Nightmares 04/22/2019  . Exposure to COVID-19 virus 10/09/2018  . Cough 10/09/2018  . Grief 11/23/2016  . Unilateral primary osteoarthritis, right knee 08/23/2016  . Hypokalemia 05/16/2016  . Rhonchi 02/09/2016  . Intertrigo 10/04/2015  . Allergic rhinitis 04/05/2015  . Well adult exam 06/28/2014  . Tinea pedis 10/13/2013  . Near syncope 09/28/2013  . LBP (low back pain) 09/24/2012  . Diabetes type 2, controlled  ([place]) 11/06/2011  . Breast cancer ([place]) 11/06/2011  . URI, acute 03/02/2011  . Flank pain 03/02/2011  . Hyperglycemia 03/02/2011  . Edema 10/30/2010  . Dyspnea and respiratory abnormalities 10/30/2010  . Cramp in limb 10/30/2010  . Bronchitis 10/06/2010  . NAUSEA ALONE 03/23/2010  . RENAL CYST, LEFT 02/23/2010  . Chest pain 02/23/2010  . Dyslipidemia 07/26/2009  . ABDOMINAL PAIN-EPIGASTRIC 03/24/2009  . HIP PAIN 07/20/2008  . BACK PAIN 07/20/2008  . FEVER, NOS 07/20/2008  . PARESTHESIA 04/06/2008  . Dysuria 04/06/2008  . Dysphagia, pharyngoesophageal phase 02/05/2008  . ABDOMINAL PAIN, GENERALIZED 02/05/2008  . HYPOKALEMIA 11/25/2007  . Depression 11/25/2007  . COLONIC POLYPS, ADENOMATOUS, HX OF 10/01/2007  . 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 KELOID 07/25/2007  . MURMUR 07/25/2007  . HEMORRHOIDS, INTERNAL 05/10/2007  . CONSTIPATION, CHRONIC 05/10/2007  . BREAST CANCER, HX OF 04/16/2007  . ACUTE BRONCHITIS 02/15/2007  . Hypothyroidism 12/26/2006  . INSOMNIA, PERSISTENT 12/26/2006  . Essential hypertension 12/26/2006  . Chronic fatigue 12/26/2006  . GERD 11/19/2006  . IBS 11/19/2006  . FATTY LIVER DISEASE 11/19/2006  . Rheumatoid arthritis ([place]) 11/19/2006  . Fibromyalgia 11/19/2006    Oretha Caprice, PT, MPT 02/22/2020, 10:10 AM  [facility] Physical Therapy [address] [place], [place], [address] Phone: [phone]   Fax:  [phone]  Name: Deanna Salazar MRN: [id number] Date of Birth: 10-10-1940

## 2020-02-23 ENCOUNTER — Ambulatory Visit (INDEPENDENT_AMBULATORY_CARE_PROVIDER_SITE_OTHER): Payer: PPO

## 2020-02-23 ENCOUNTER — Encounter: Payer: Self-pay | Admitting: Internal Medicine

## 2020-02-23 ENCOUNTER — Ambulatory Visit (INDEPENDENT_AMBULATORY_CARE_PROVIDER_SITE_OTHER): Payer: PPO | Admitting: Internal Medicine

## 2020-02-23 VITALS — BP 128/70 | HR 64 | Temp 98.2°F | Ht 63.0 in | Wt 191.6 lb

## 2020-02-23 VITALS — BP 128/70 | HR 64 | Temp 98.2°F | Resp 18 | Ht 63.0 in | Wt 191.6 lb

## 2020-02-23 DIAGNOSIS — E876 Hypokalemia: Secondary | ICD-10-CM

## 2020-02-23 DIAGNOSIS — M797 Fibromyalgia: Secondary | ICD-10-CM

## 2020-02-23 DIAGNOSIS — Z0000 Encounter for general adult medical examination without abnormal findings: Secondary | ICD-10-CM

## 2020-02-23 DIAGNOSIS — E119 Type 2 diabetes mellitus without complications: Secondary | ICD-10-CM | POA: Diagnosis not present

## 2020-02-23 DIAGNOSIS — E785 Hyperlipidemia, unspecified: Secondary | ICD-10-CM

## 2020-02-23 DIAGNOSIS — F329 Major depressive disorder, single episode, unspecified: Secondary | ICD-10-CM

## 2020-02-23 DIAGNOSIS — I10 Essential (primary) hypertension: Secondary | ICD-10-CM | POA: Diagnosis not present

## 2020-02-23 LAB — COMPREHENSIVE METABOLIC PANEL
ALT: 9 U/L (ref 0–35)
AST: 15 U/L (ref 0–37)
Albumin: 4.1 g/dL (ref 3.5–5.2)
Alkaline Phosphatase: 82 U/L (ref 39–117)
BUN: 22 mg/dL (ref 6–23)
CO2: 31 mEq/L (ref 19–32)
Calcium: 10.4 mg/dL (ref 8.4–10.5)
Chloride: 103 mEq/L (ref 96–112)
Creatinine, Ser: 1.13 mg/dL (ref 0.40–1.20)
GFR: 45.95 mL/min — ABNORMAL LOW (ref >60.00)
Glucose, Bld: 92 mg/dL (ref 70–99)
Potassium: 4.1 mEq/L (ref 3.5–5.1)
Sodium: 140 mEq/L (ref 135–145)
Total Bilirubin: 0.3 mg/dL (ref 0.2–1.2)
Total Protein: 7.1 g/dL (ref 6.0–8.3)

## 2020-02-23 LAB — CBC WITH DIFFERENTIAL/PLATELET
Basophils Absolute: 0.1 10*3/uL (ref 0.0–0.1)
Basophils Relative: 0.8 % (ref 0.0–3.0)
Eosinophils Absolute: 0.2 10*3/uL (ref 0.0–0.7)
Eosinophils Relative: 3.6 % (ref 0.0–5.0)
HCT: 37.1 % (ref 36.0–46.0)
Hemoglobin: 12.2 g/dL (ref 12.0–15.0)
Lymphocytes Relative: 29.7 % (ref 12.0–46.0)
Lymphs Abs: 1.9 10*3/uL (ref 0.7–4.0)
MCHC: 32.9 g/dL (ref 30.0–36.0)
MCV: 93 fl (ref 78.0–100.0)
Monocytes Absolute: 0.7 10*3/uL (ref 0.1–1.0)
Monocytes Relative: 10.4 % (ref 3.0–12.0)
Neutro Abs: 3.5 10*3/uL (ref 1.4–7.7)
Neutrophils Relative %: 55.5 % (ref 43.0–77.0)
Platelets: 234 10*3/uL (ref 150.0–400.0)
RBC: 3.99 Mil/uL (ref 3.87–5.11)
RDW: 14 % (ref 11.5–15.5)
WBC: 6.3 10*3/uL (ref 4.0–10.5)

## 2020-02-23 LAB — MICROALBUMIN / CREATININE URINE RATIO
Creatinine,U: 51 mg/dL
Microalb Creat Ratio: 1.4 mg/g (ref 0.0–30.0)
Microalb, Ur: <0.7 mg/dL (ref 0.0–1.9)

## 2020-02-23 LAB — URINALYSIS
Bilirubin Urine: NEGATIVE
Hgb urine dipstick: NEGATIVE
Ketones, ur: NEGATIVE
Leukocytes,Ua: NEGATIVE
Nitrite: NEGATIVE
Specific Gravity, Urine: <=1.005 — AB (ref 1.000–1.030)
Total Protein, Urine: NEGATIVE
Urine Glucose: NEGATIVE
Urobilinogen, UA: 0.2 (ref 0.0–1.0)
pH: 5.5 (ref 5.0–8.0)

## 2020-02-23 LAB — LIPID PANEL
Cholesterol: 202 mg/dL — ABNORMAL HIGH (ref 0–200)
HDL: 45.3 mg/dL (ref >39.00)
LDL Cholesterol: 143 mg/dL — ABNORMAL HIGH (ref 0–99)
NonHDL: 156.35
Total CHOL/HDL Ratio: 4
Triglycerides: 68 mg/dL (ref 0.0–149.0)
VLDL: 13.6 mg/dL (ref 0.0–40.0)

## 2020-02-23 LAB — HEMOGLOBIN A1C: Hgb A1c MFr Bld: 6 % (ref 4.6–6.5)

## 2020-02-23 LAB — TSH: TSH: 2.09 u[IU]/mL (ref 0.35–4.50)

## 2020-02-23 MED ORDER — POTASSIUM CHLORIDE ER 10 MEQ PO CPCR: 10.0000 meq | ORAL_CAPSULE | Freq: Two times a day (BID) | ORAL | 11 refills | Status: DC

## 2020-02-23 MED ORDER — ZOLPIDEM TARTRATE 5 MG PO TABS: ORAL_TABLET | ORAL | 3 refills | Status: DC

## 2020-02-23 NOTE — Assessment & Plan Note (Addendum)
On amlodipine, Coreg, Lasix,  Kdur - can't take

## 2020-02-23 NOTE — Assessment & Plan Note (Addendum)
Pt is not depressed now  Pt is waking up at 2-3 am after 4 hrs on Zolpidem

## 2020-02-23 NOTE — Patient Instructions (Addendum)
Ms. Dethlefs , Thank you for taking time to come for your Medicare Wellness Visit. I appreciate your ongoing commitment to your health goals. Please review the following plan we discussed and let me know if I can assist you in the future.   Screening recommendations/referrals: Colonoscopy: last done 10/07/2017; no repeat due to age 80: scheduled for 04/01/2020 per patient Bone Density: last done 11/10/2013; results : normal Recommended yearly ophthalmology/optometry visit for glaucoma screening and checkup Recommended yearly dental visit for hygiene and checkup  Vaccinations: Influenza vaccine: 11/05/2019 Pneumococcal vaccine: 04/05/2014, 12/06/2014 Tdap vaccine: 04/05/2014; due every 10 years Shingles vaccine: never done; pamphlet given to patient; can check with local pharmacies Covid-19: 3/11/021, 04/13/2019, 10/20/2019  Advanced directives: Advance directive discussed with you today. I have provided a copy for you to complete at home and have notarized. Once this is complete please bring a copy in to our office so we can scan it into your chart. (Patient has the paperwork)  Conditions/risks identified: Yes; Reviewed health maintenance screenings with patient today and relevant education, vaccines, and/or referrals were provided. Please continue to do your personal lifestyle choices by: daily care of teeth and gums, regular physical activity (goal should be 5 days a week for 30 minutes), eat a healthy diet, avoid tobacco and drug use, limiting any alcohol intake, taking a low-dose aspirin (if not allergic or have been advised by your provider otherwise) and taking vitamins and minerals as recommended by your provider. Continue doing brain stimulating activities (puzzles, reading, adult coloring books, staying active) to keep memory sharp. Continue to eat heart healthy diet (full of fruits, vegetables, whole grains, lean protein, water--limit salt, fat, and sugar intake) and increase physical  activity as tolerated.  Next appointment: Please schedule your next Medicare Wellness Visit with your Nurse Health Advisor in 1 year by calling [phone].   Preventive Care 81 Years and Older, Female Preventive care refers to lifestyle choices and visits with your health care provider that can promote health and wellness. What does preventive care include?  A yearly physical exam. This is also called an annual well check.  Dental exams once or twice a year.  Routine eye exams. Ask your health care provider how often you should have your eyes checked.  Personal lifestyle choices, including:  Daily care of your teeth and gums.  Regular physical activity.  Eating a healthy diet.  Avoiding tobacco and drug use.  Limiting alcohol use.  Practicing safe sex.  Taking low-dose aspirin every day.  Taking vitamin and mineral supplements as recommended by your health care provider. What happens during an annual well check? The services and screenings done by your health care provider during your annual well check will depend on your age, overall health, lifestyle risk factors, and family history of disease. Counseling  Your health care provider may ask you questions about your:  Alcohol use.  Tobacco use.  Drug use.  Emotional well-being.  Home and relationship well-being.  Sexual activity.  Eating habits.  History of falls.  Memory and ability to understand (cognition).  Work and work Statistician.  Reproductive health. Screening  You may have the following tests or measurements:  Height, weight, and BMI.  Blood pressure.  Lipid and cholesterol levels. These may be checked every 5 years, or more frequently if you are over 101 years old.  Skin check.  Lung cancer screening. You may have this screening every year starting at age 45 if you have a 30-pack-year history of smoking and  currently smoke or have quit within the past 15 years.  Fecal occult blood  test (FOBT) of the stool. You may have this test every year starting at age 47.  Flexible sigmoidoscopy or colonoscopy. You may have a sigmoidoscopy every 5 years or a colonoscopy every 10 years starting at age 41.  Hepatitis C blood test.  Hepatitis B blood test.  Sexually transmitted disease (STD) testing.  Diabetes screening. This is done by checking your blood sugar (glucose) after you have not eaten for a while (fasting). You may have this done every 1-3 years.  Bone density scan. This is done to screen for osteoporosis. You may have this done starting at age 8.  Mammogram. This may be done every 1-2 years. Talk to your health care provider about how often you should have regular mammograms. Talk with your health care provider about your test results, treatment options, and if necessary, the need for more tests. Vaccines  Your health care provider may recommend certain vaccines, such as:  Influenza vaccine. This is recommended every year.  Tetanus, diphtheria, and acellular pertussis (Tdap, Td) vaccine. You may need a Td booster every 10 years.  Zoster vaccine. You may need this after age 69.  Pneumococcal 13-valent conjugate (PCV13) vaccine. One dose is recommended after age 63.  Pneumococcal polysaccharide (PPSV23) vaccine. One dose is recommended after age 64. Talk to your health care provider about which screenings and vaccines you need and how often you need them. This information is not intended to replace advice given to you by your health care provider. Make sure you discuss any questions you have with your health care provider. Document Released: 01/21/2015 Document Revised: 09/14/2015 Document Reviewed: 10/26/2014 Elsevier Interactive Patient Education  2017 [place] Prevention in the Home Falls can cause injuries. They can happen to people of all ages. There are many things you can do to make your home safe and to help prevent falls. What can I do on  the outside of my home?  Regularly fix the edges of walkways and driveways and fix any cracks.  Remove anything that might make you trip as you walk through a door, such as a raised step or threshold.  Trim any bushes or trees on the path to your home.  Use bright outdoor lighting.  Clear any walking paths of anything that might make someone trip, such as rocks or tools.  Regularly check to see if handrails are loose or broken. Make sure that both sides of any steps have handrails.  Any raised decks and porches should have guardrails on the edges.  Have any leaves, snow, or ice cleared regularly.  Use sand or salt on walking paths during winter.  Clean up any spills in your garage right away. This includes oil or grease spills. What can I do in the bathroom?  Use night lights.  Install grab bars by the toilet and in the tub and shower. Do not use towel bars as grab bars.  Use non-skid mats or decals in the tub or shower.  If you need to sit down in the shower, use a plastic, non-slip stool.  Keep the floor dry. Clean up any water that spills on the floor as soon as it happens.  Remove soap buildup in the tub or shower regularly.  Attach bath mats securely with double-sided non-slip rug tape.  Do not have throw rugs and other things on the floor that can make you trip. What can I do  in the bedroom?  Use night lights.  Make sure that you have a light by your bed that is easy to reach.  Do not use any sheets or blankets that are too big for your bed. They should not hang down onto the floor.  Have a firm chair that has side arms. You can use this for support while you get dressed.  Do not have throw rugs and other things on the floor that can make you trip. What can I do in the kitchen?  Clean up any spills right away.  Avoid walking on wet floors.  Keep items that you use a lot in easy-to-reach places.  If you need to reach something above you, use a strong step  stool that has a grab bar.  Keep electrical cords out of the way.  Do not use floor polish or wax that makes floors slippery. If you must use wax, use non-skid floor wax.  Do not have throw rugs and other things on the floor that can make you trip. What can I do with my stairs?  Do not leave any items on the stairs.  Make sure that there are handrails on both sides of the stairs and use them. Fix handrails that are broken or loose. Make sure that handrails are as long as the stairways.  Check any carpeting to make sure that it is firmly attached to the stairs. Fix any carpet that is loose or worn.  Avoid having throw rugs at the top or bottom of the stairs. If you do have throw rugs, attach them to the floor with carpet tape.  Make sure that you have a light switch at the top of the stairs and the bottom of the stairs. If you do not have them, ask someone to add them for you. What else can I do to help prevent falls?  Wear shoes that:  Do not have high heels.  Have rubber bottoms.  Are comfortable and fit you well.  Are closed at the toe. Do not wear sandals.  If you use a stepladder:  Make sure that it is fully opened. Do not climb a closed stepladder.  Make sure that both sides of the stepladder are locked into place.  Ask someone to hold it for you, if possible.  Clearly mark and make sure that you can see:  Any grab bars or handrails.  First and last steps.  Where the edge of each step is.  Use tools that help you move around (mobility aids) if they are needed. These include:  Canes.  Walkers.  Scooters.  Crutches.  Turn on the lights when you go into a dark area. Replace any light bulbs as soon as they burn out.  Set up your furniture so you have a clear path. Avoid moving your furniture around.  If any of your floors are uneven, fix them.  If there are any pets around you, be aware of where they are.  Review your medicines with your doctor. Some  medicines can make you feel dizzy. This can increase your chance of falling. Ask your doctor what other things that you can do to help prevent falls. This information is not intended to replace advice given to you by your health care provider. Make sure you discuss any questions you have with your health care provider. Document Released: 10/21/2008 Document Revised: 06/02/2015 Document Reviewed: 01/29/2014 Elsevier Interactive Patient Education  2017 [employer]. -

## 2020-02-23 NOTE — Progress Notes (Addendum)
Subjective:   Deanna Salazar is a 80 y.o. female who presents for Medicare Annual (Subsequent) preventive examination.  Review of Systems    No ROS. Medicare Wellness Visit. Additional risk factors are reflected in social history. Cardiac Risk Factors include: advanced age (>72mn, >>107women);diabetes mellitus;dyslipidemia;hypertension;obesity (BMI >30kg/m2)     Objective:    Today's Vitals   02/23/20 0816  BP: 128/70  Pulse: 64  Resp: 18  Temp: 98.2 F (36.8 C)  SpO2: 97%  Weight: 191 lb 9.6 oz (86.9 kg)  Height: 5' 3"  (1.6 m)  PainSc: 4   PainLoc: Shoulder   Body mass index is 33.94 kg/m.  Advanced Directives 02/23/2020 02/01/2020 09/08/2018 08/23/2017 08/22/2016 08/11/2016 02/28/2016  Does Patient Have a Medical Advance Directive? No No No No No No No  Would patient like information on creating a medical advance directive? No - Patient declined - No - Patient declined Yes (ED - Information included in AVS) Yes (ED - Information included in AVS) No - Patient declined -    Current Medications (verified) Outpatient Encounter Medications as of 02/23/2020  Medication Sig   acetaminophen (TYLENOL) 500 MG tablet Take 500-1,000 mg by mouth every 6 (six) hours as needed for mild pain, moderate pain, fever or headache.   amLODipine (NORVASC) 10 MG tablet Take 1 tablet (10 mg total) by mouth daily.   aspirin (ASPIRIN CHILDRENS) 81 MG chewable tablet Chew 1 tablet (81 mg total) by mouth daily.   Blood Glucose Monitoring Suppl (ONE TOUCH ULTRA 2) w/Device KIT Use to check blood sugars daily   brimonidine (ALPHAGAN) 0.15 % ophthalmic solution    carvedilol (COREG) 25 MG tablet TAKE 1 TABLET(25 MG) BY MOUTH TWICE DAILY WITH A MEAL   cholecalciferol (VITAMIN D) 1000 units tablet Take 1,000 Units by mouth daily.   folic acid (FOLVITE) 1 MG tablet Take 1 mg by mouth daily.   furosemide (LASIX) 20 MG tablet Take 1-2 tablets (20-40 mg total) by mouth daily as needed for edema.   glucose  blood (ONETOUCH ULTRA) test strip Use to check blood sugars daily   Lancets (ONETOUCH ULTRASOFT) lancets Use to check blood sugars daily   latanoprost (XALATAN) 0.005 % ophthalmic solution INT 1 GTT IN OU Q NIGHT   levothyroxine (SYNTHROID) 25 MCG tablet TAKE 1 TABLET BY MOUTH ONCE DAILY BEFORE BREAKFAST   meclizine (ANTIVERT) 25 MG tablet TAKE 1 TABLET BY MOUTH THREE TIMES DAILY AS NEEDED FOR DIZZINESS OR NAUSEA   metFORMIN (GLUCOPHAGE) 500 MG tablet Take 1 tablet (500 mg total) by mouth 2 (two) times daily with a meal.   methotrexate 2.5 MG tablet Take 15 mg by mouth once a week.   Multiple Vitamins-Minerals (PRESERVISION AREDS 2+MULTI VIT PO) Take by mouth.   Polyethyl Glycol-Propyl Glycol (SYSTANE OP) Apply to eye.   Polyethylene Glycol 3350 (MIRALAX PO) Take by mouth.   potassium chloride (KLOR-CON) 10 MEQ tablet TAKE 1 TABLET BY MOUTH EVERY DAY   prednisoLONE acetate (PRED FORTE) 1 % ophthalmic suspension Place 1 drop into the left eye 4 (four) times daily. (Patient not taking: Reported on 12/22/2019)   predniSONE (STERAPRED UNI-PAK 48 TAB) 5 MG (48) TBPK tablet 12 day dosepack po   pregabalin (LYRICA) 25 MG capsule Take 1-2 capsules (25-50 mg total) by mouth 2 (two) times daily as needed (pain).   RABEprazole (ACIPHEX) 20 MG tablet Take 1 tablet (20 mg total) by mouth daily.   traMADol (ULTRAM) 50 MG tablet Take 50 mg by  mouth daily as needed.   TRAVATAN Z 0.004 % SOLN ophthalmic solution INT 1 GTT IN OU HS   XIIDRA 5 % SOLN 1 drop Both Eyes 2 times a day   zolpidem (AMBIEN) 5 MG tablet Take 1 tablet (5 mg total) by mouth at bedtime as needed for sleep.   No facility-administered encounter medications on file as of 02/23/2020.    Allergies (verified) Codeine, Hydrocodone, Lovastatin, Pantoprazole, Pepcid [famotidine], Telmisartan, Tramadol hcl, and Trazodone and nefazodone   History: Past Medical History:  Diagnosis Date   Abdominal pain, generalized 02/05/2008   ABDOMINAL  PAIN-EPIGASTRIC 03/24/2009   Acute bronchitis 02/15/2007   Adenomatous colon polyp 04/2000   Arthritis    ASCUS favor benign 10/2011   negative high risk HPV screen   BACK PAIN 07/20/2008   Breast cancer ([place]) 2009   l, hx of XRT Dr Benay Spice, Left side   BREAST CANCER, HX OF 04/16/2007   CHEST PAIN 02/23/2010   COLONIC POLYPS, ADENOMATOUS, HX OF 10/01/2007   CONSTIPATION, CHRONIC 05/10/2007   Dysuria 04/06/2008   FATIGUE 12/26/2006   Fatty liver    FATTY LIVER DISEASE 11/19/2006   FIBROMYALGIA 11/19/2006   GERD 11/19/2006   Dr Fuller Plan   Glaucoma 07/2017   left eye   HEMATOCHEZIA 05/10/2007   HEMORRHOIDS, INTERNAL 05/10/2007   HIP PAIN 07/20/2008   HYPERLIPIDEMIA 07/26/2009   HYPERTENSION 12/26/2006   HYPOKALEMIA 11/25/2007   HYPOTHYROIDISM 12/26/2006   IBS 11/19/2006   INSOMNIA, PERSISTENT 12/26/2006   KELOID 07/25/2007   MURMUR 07/25/2007   Nausea alone 03/23/2010   PARESTHESIA 04/06/2008   Personal history of radiation therapy    RENAL CYST, LEFT 02/23/2010   Rheumatoid arthritis(714.0) 11/19/2006   Dr Vianne Bulls   RUQ PAIN 11/07/2009   Tubular adenoma    VISION IMPAIRMENT, LOW VISION, ONE EYE-LEFT 07/25/2007   Past Surgical History:  Procedure Laterality Date   BREAST LUMPECTOMY Left 2009   BREAST SURGERY  2009   Left lumpectomy   COLONOSCOPY  06/12/2012   Cyst excision from chest     knee surgery Left    Left thyroidectomy     OS Cataract extraction  2009   OVARIAN CYST REMOVAL  age 83   SHOULDER SURGERY     both shoulders   TOE SURGERY     TUBAL LIGATION     Family History  Problem Relation Age of Onset   Lung cancer Brother    Colon cancer Brother 46       50's   Diabetes Father    Brain cancer Brother    Stroke Sister    Breast cancer Neg Hx    Social History   Socioeconomic History   Marital status: Widowed    Spouse name: Not on file   Number of children: 5   Years of education: Not on file   Highest education level: Not on file  Occupational History    Occupation: retired    Fish farm manager: RETIRED  Tobacco Use   Smoking status: Never Smoker   Smokeless tobacco: Never Used  Scientific laboratory technician Use: Never used  Substance and Sexual Activity   Alcohol use: No    Alcohol/week: 0.0 standard drinks   Drug use: No   Sexual activity: Never    Comment: 1st intercourse 80 yo-Fewer than 5 partners  Other Topics Concern   Not on file  Social History Narrative   Patient does not get regular exercise   Social Determinants of Health  Financial Resource Strain: Low Risk    Difficulty of Paying Living Expenses: Not hard at all  Food Insecurity: No Food Insecurity   Worried About Charity fundraiser in the Last Year: Never true   Ran Out of Food in the Last Year: Never true  Transportation Needs: No Transportation Needs   Lack of Transportation (Medical): No   Lack of Transportation (Non-Medical): No  Physical Activity: Insufficiently Active   Days of Exercise per Week: 3 days   Minutes of Exercise per Session: 40 min  Stress: No Stress Concern Present   Feeling of Stress : Only a little  Social Connections: Socially Isolated   Frequency of Communication with Friends and Family: More than three times a week   Frequency of Social Gatherings with Friends and Family: Never   Attends Religious Services: Never   Marine scientist or Organizations: No   Attends Archivist Meetings: Never   Marital Status: Widowed    Tobacco Counseling Counseling given: Not Answered   Clinical Intake:  Pre-visit preparation completed: Yes  Pain : 0-10 Pain Score: 4  Pain Type: Acute pain Pain Location: Shoulder Pain Orientation: Left Pain Radiating Towards: n/a Pain Descriptors / Indicators: Constant,Aching Pain Onset: More than a month ago Pain Frequency: Constant Pain Relieving Factors: Pregablin Effect of Pain on Daily Activities: Pain can diminish job performance, lower motivation to exercise, and prevent you from completing daily  tasks. Pain produces disability and affects the quality of life.  Pain Relieving Factors: Pregablin  BMI - recorded: 33.94 Nutritional Status: BMI > 30  Obese Nutritional Risks: None Diabetes: Yes CBG done?: Yes (125 fasting) CBG resulted in Enter/ Edit results?: Yes Did pt. bring in CBG monitor from home?: No  How often do you need to have someone help you when you read instructions, pamphlets, or other written materials from your doctor or pharmacy?: 1 - Never What is the last grade level you completed in school?: 9th grade  Diabetic? yes  Interpreter Needed?: No  Information entered by :: Lisette Abu, LPN.   Activities of Daily Living In your present state of health, do you have any difficulty performing the following activities: 02/23/2020 12/22/2019  Hearing? N N  Vision? N N  Difficulty concentrating or making decisions? N N  Walking or climbing stairs? N N  Dressing or bathing? N N  Doing errands, shopping? N N  Preparing Food and eating ? N -  Using the Toilet? N -  In the past six months, have you accidently leaked urine? N -  Do you have problems with loss of bowel control? N -  Managing your Medications? N -  Managing your Finances? N -  Housekeeping or managing your Housekeeping? N -  Some recent data might be hidden    Patient Care Team: Plotnikov, Evie Lacks, MD as PCP - General Hennie Duos, MD (Rheumatology) Newt Minion, MD (Orthopedic Surgery) Rosita Kea, PA-C (Rheumatology)  Indicate any recent Medical Services you may have received from other than [facility] providers in the past year (date may be approximate).     Assessment:   This is a routine wellness examination for Deanna Salazar.  Hearing/Vision screen No exam data present  Dietary issues and exercise activities discussed: Current Exercise Habits: Home exercise routine, Type of exercise: walking;Other - see comments (stationary bike), Time (Minutes): 40, Frequency (Times/Week): 3,  Weekly Exercise (Minutes/Week): 120, Intensity: Moderate, Exercise limited by: None identified  Goals  lose weight     Continue to exercise several times weekly, read food labels to decrease carbohydrates and sugar, monitor my portions.  Enjoy life, family and church.     Patient Stated     Stay as healthy and as independent as possible by eating healthy, stay physically and socially active, love family, God and enjoy life.      Patient Stated     I want to lose weight by increasing physical activity and monitoring my diet closely for fattening foods.     Weight < 200 lb (90.719 kg)     Will continue the journey;  Will start to monitor sweets;  And keep exercising Low weight with some toning of muscles as tolerated       Depression Screen PHQ 2/9 Scores 02/23/2020 12/22/2019 09/08/2018 08/23/2017 08/22/2016 02/09/2016 01/09/2015  PHQ - 2 Score 0 0 1 0 1 0 0  PHQ- 9 Score - - - - 6 - -    Fall Risk Fall Risk  02/23/2020 12/22/2019 11/10/2018 09/08/2018 08/23/2017  Falls in the past year? 0 0 0 0 No  Number falls in past yr: 0 0 - 0 -  Injury with Fall? 0 0 - 0 -  Risk for fall due to : No Fall Risks - - - Impaired balance/gait;Impaired mobility  Follow up - - Falls evaluation completed - -    FALL RISK PREVENTION PERTAINING TO THE HOME:  Any stairs in or around the home? No  If so, are there any without handrails? No  Home free of loose throw rugs in walkways, pet beds, electrical cords, etc? Yes  Adequate lighting in your home to reduce risk of falls? Yes   ASSISTIVE DEVICES UTILIZED TO PREVENT FALLS:  Life alert? No  Use of a cane, walker or w/c? No  Grab bars in the bathroom? Yes  Shower chair or bench in shower? No  Elevated toilet seat or a handicapped toilet? Yes   TIMED UP AND GO:  Was the test performed? No .  Length of time to ambulate 10 feet: 0 sec.   Gait steady and fast without use of assistive device  Cognitive Function: Normal cognitive status assessed  by direct observation by this Nurse Health Advisor. No abnormalities found.   MMSE - Mini Mental State Exam 08/23/2017 08/22/2016  Orientation to time 5 5  Orientation to Place 5 5  Registration 3 3  Attention/ Calculation 5 4  Recall 3 2  Language- name 2 objects 2 2  Language- repeat 1 1  Language- follow 3 step command 3 3  Language- read & follow direction 1 1  Write a sentence 1 1  Copy design 1 1  Total score 30 28     6CIT Screen 09/08/2018  What Year? 0 points  What month? 0 points  What time? 0 points  Count back from 20 0 points  Months in reverse 0 points  Repeat phrase 0 points  Total Score 0    Immunizations Immunization History  Administered Date(s) Administered   Fluad Quad(high Dose 65+) 09/08/2018, 11/05/2019   Influenza Split 10/30/2010, 01/16/2012   Influenza Whole 03/03/2009   Influenza, High Dose Seasonal PF 12/06/2014, 10/04/2015, 11/23/2016, 10/11/2017   Influenza,inj,Quad PF,6+ Mos 09/24/2012, 01/04/2014   PFIZER(Purple Top)SARS-COV-2 Vaccination 03/19/2019, 04/13/2019, 10/20/2019   Pneumococcal Conjugate-13 04/05/2014   Pneumococcal Polysaccharide-23 12/06/2014   Td 04/05/2014    TDAP status: Up to date  Flu Vaccine status: Up to date  Pneumococcal vaccine  status: Up to date  Covid-19 vaccine status: Completed vaccines  Qualifies for Shingles Vaccine? Yes   Zostavax completed No   Shingrix Completed?: No.    Education has been provided regarding the importance of this vaccine. Patient has been advised to call insurance company to determine out of pocket expense if they have not yet received this vaccine. Advised may also receive vaccine at local pharmacy or Health Dept. Verbalized acceptance and understanding.  Screening Tests Health Maintenance  Topic Date Due   OPHTHALMOLOGY EXAM  04/09/2018   FOOT EXAM  09/08/2019   URINE MICROALBUMIN  09/08/2019   COVID-19 Vaccine (4 - Booster for Pfizer series) 04/19/2020   TETANUS/TDAP   04/04/2024   INFLUENZA VACCINE  Completed   DEXA SCAN  Completed   PNA vac Low Risk Adult  Completed    Health Maintenance  Health Maintenance Due  Topic Date Due   OPHTHALMOLOGY EXAM  04/09/2018   FOOT EXAM  09/08/2019   URINE MICROALBUMIN  09/08/2019    Colorectal cancer screening: No longer required.   Mammogram status: Completed 02/09/2019. Repeat every year  Bone Density status: Completed 11/10/2013. Results reflect: Bone density results: NORMAL. Repeat every 0 years.  Lung Cancer Screening: (Low Dose CT Chest recommended if Age 51-80 years, 30 pack-year currently smoking OR have quit w/in 15years.) does not qualify.   Lung Cancer Screening Referral: no  Additional Screening:  Hepatitis C Screening: does not qualify; Completed no  Vision Screening: Recommended annual ophthalmology exams for early detection of glaucoma and other disorders of the eye. Is the patient up to date with their annual eye exam?  Yes  Who is the provider or what is the name of the office in which the patient attends annual eye exams? Thurston Hole, MD. If pt is not established with a provider, would they like to be referred to a provider to establish care? No .   Dental Screening: Recommended annual dental exams for proper oral hygiene  Community Resource Referral / Chronic Care Management: CRR required this visit?  No   CCM required this visit?  No      Plan:     I have personally reviewed and noted the following in the patient's chart:   Medical and social history Use of alcohol, tobacco or illicit drugs  Current medications and supplements Functional ability and status Nutritional status Physical activity Advanced directives List of other physicians Hospitalizations, surgeries, and ER visits in previous 12 months Vitals Screenings to include cognitive, depression, and falls Referrals and appointments  In addition, I have reviewed and discussed with patient certain preventive  protocols, quality metrics, and best practice recommendations. A written personalized care plan for preventive services as well as general preventive health recommendations were provided to patient.     Sheral Flow, LPN   [id number]   Nurse Notes: n/a   Medical screening examination/treatment/procedure(s) were performed by non-physician practitioner and as supervising physician I was immediately available for consultation/collaboration.  I agree with above. Lew Dawes, MD

## 2020-02-23 NOTE — Assessment & Plan Note (Signed)
A1c

## 2020-02-23 NOTE — Progress Notes (Signed)
Subjective:  Patient ID: Deanna Salazar, female    DOB: May 29, 1940  Age: 80 y.o. MRN: [id number]  CC: Follow-up (6 month f/u- Pt states she can not take the Potassium pills.. she states every time she takes it makes her sick on her stomach. Also want to discuss Metformin)   HPI [employer] presents for HTN, hypokalemia - unable to take KCl ER due to nausea C/o insomnia - waking up at 2-3 am after 4 hrs on Zolpidem   Outpatient Medications Prior to Visit  Medication Sig Dispense Refill  . acetaminophen (TYLENOL) 500 MG tablet Take 500-1,000 mg by mouth every 6 (six) hours as needed for mild pain, moderate pain, fever or headache.    Marland Kitchen amLODipine (NORVASC) 10 MG tablet Take 1 tablet (10 mg total) by mouth daily. 90 tablet 3  . aspirin (ASPIRIN CHILDRENS) 81 MG chewable tablet Chew 1 tablet (81 mg total) by mouth daily. 36 tablet 11  . Blood Glucose Monitoring Suppl (ONE TOUCH ULTRA 2) w/Device KIT Use to check blood sugars daily 1 kit 0  . brimonidine (ALPHAGAN) 0.15 % ophthalmic solution     . carvedilol (COREG) 25 MG tablet TAKE 1 TABLET(25 MG) BY MOUTH TWICE DAILY WITH A MEAL 180 tablet 3  . cholecalciferol (VITAMIN D) 1000 units tablet Take 1,000 Units by mouth daily.    . folic acid (FOLVITE) 1 MG tablet Take 1 mg by mouth daily.    . furosemide (LASIX) 20 MG tablet Take 1-2 tablets (20-40 mg total) by mouth daily as needed for edema. 60 tablet 5  . glucose blood (ONETOUCH ULTRA) test strip Use to check blood sugars daily 100 each 3  . Lancets (ONETOUCH ULTRASOFT) lancets Use to check blood sugars daily 100 each 3  . latanoprost (XALATAN) 0.005 % ophthalmic solution INT 1 GTT IN OU Q NIGHT  3  . levothyroxine (SYNTHROID) 25 MCG tablet TAKE 1 TABLET BY MOUTH ONCE DAILY BEFORE BREAKFAST 90 tablet 3  . meclizine (ANTIVERT) 25 MG tablet TAKE 1 TABLET BY MOUTH THREE TIMES DAILY AS NEEDED FOR DIZZINESS OR NAUSEA 30 tablet 3  . metFORMIN (GLUCOPHAGE) 500 MG tablet Take 1 tablet (500 mg  total) by mouth 2 (two) times daily with a meal. 180 tablet 3  . methotrexate 2.5 MG tablet Take 15 mg by mouth once a week.    . Multiple Vitamins-Minerals (PRESERVISION AREDS 2+MULTI VIT PO) Take by mouth.    Vladimir Faster Glycol-Propyl Glycol (SYSTANE OP) Apply to eye.    . Polyethylene Glycol 3350 (MIRALAX PO) Take by mouth.    . pregabalin (LYRICA) 25 MG capsule Take 1-2 capsules (25-50 mg total) by mouth 2 (two) times daily as needed (pain). 60 capsule 1  . RABEprazole (ACIPHEX) 20 MG tablet Take 1 tablet (20 mg total) by mouth daily. 90 tablet 3  . TRAVATAN Z 0.004 % SOLN ophthalmic solution INT 1 GTT IN OU HS  11  . XIIDRA 5 % SOLN 1 drop Both Eyes 2 times a day    . zolpidem (AMBIEN) 5 MG tablet Take 1 tablet (5 mg total) by mouth at bedtime as needed for sleep. 30 tablet 2  . predniSONE (STERAPRED UNI-PAK 48 TAB) 5 MG (48) TBPK tablet 12 day dosepack po 48 tablet 0  . potassium chloride (KLOR-CON) 10 MEQ tablet TAKE 1 TABLET BY MOUTH EVERY DAY (Patient not taking: Reported on 02/23/2020) 90 tablet 1  . prednisoLONE acetate (PRED FORTE) 1 % ophthalmic suspension Place  1 drop into the left eye 4 (four) times daily. (Patient not taking: No sig reported)    . traMADol (ULTRAM) 50 MG tablet Take 50 mg by mouth daily as needed. (Patient not taking: Reported on 02/23/2020)     No facility-administered medications prior to visit.    ROS: Review of Systems  Constitutional: Negative for activity change, appetite change, chills, fatigue and unexpected weight change.  HENT: Negative for congestion, mouth sores and sinus pressure.   Eyes: Negative for visual disturbance.  Respiratory: Negative for cough and chest tightness.   Gastrointestinal: Negative for abdominal pain and nausea.  Genitourinary: Negative for difficulty urinating, frequency and vaginal pain.  Musculoskeletal: Negative for back pain and gait problem.  Skin: Negative for pallor and rash.  Neurological: Negative for dizziness,  tremors, weakness, numbness and headaches.  Psychiatric/Behavioral: Positive for sleep disturbance. Negative for confusion and suicidal ideas.    Objective:  BP 128/70 (BP Location: Right Arm)   Pulse 64   Temp 98.2 F (36.8 C) (Oral)   Ht 5' 3"  (1.6 m)   Wt 191 lb 9.6 oz (86.9 kg)   SpO2 97%   BMI 33.94 kg/m   BP Readings from Last 3 Encounters:  02/23/20 128/70  02/23/20 128/70  12/30/19 132/86    Wt Readings from Last 3 Encounters:  02/23/20 191 lb 9.6 oz (86.9 kg)  02/23/20 191 lb 9.6 oz (86.9 kg)  12/30/19 196 lb 6.4 oz (89.1 kg)    Physical Exam Constitutional:      General: She is not in acute distress.    Appearance: She is well-developed. She is obese.  HENT:     Head: Normocephalic.     Right Ear: External ear normal.     Left Ear: External ear normal.     Nose: Nose normal.     Mouth/Throat:     Mouth: Oropharynx is clear and moist.  Eyes:     General:        Right eye: No discharge.        Left eye: No discharge.     Conjunctiva/sclera: Conjunctivae normal.     Pupils: Pupils are equal, round, and reactive to light.  Neck:     Thyroid: No thyromegaly.     Vascular: No JVD.     Trachea: No tracheal deviation.  Cardiovascular:     Rate and Rhythm: Normal rate and regular rhythm.     Heart sounds: Normal heart sounds.  Pulmonary:     Effort: No respiratory distress.     Breath sounds: No stridor. No wheezing.  Abdominal:     General: Bowel sounds are normal. There is no distension.     Palpations: Abdomen is soft. There is no mass.     Tenderness: There is no abdominal tenderness. There is no guarding or rebound.  Musculoskeletal:        General: Tenderness present. No edema.     Cervical back: Normal range of motion and neck supple.  Lymphadenopathy:     Cervical: No cervical adenopathy.  Skin:    Findings: No erythema or rash.  Neurological:     Cranial Nerves: No cranial nerve deficit.     Motor: No abnormal muscle tone.      Coordination: Coordination normal.     Deep Tendon Reflexes: Reflexes normal.  Psychiatric:        Mood and Affect: Mood and affect normal.        Behavior: Behavior normal.  Thought Content: Thought content normal.        Judgment: Judgment normal.     Lab Results  Component Value Date   WBC 8.2 04/22/2019   HGB 12.5 04/22/2019   HCT 37.8 04/22/2019   PLT 203.0 04/22/2019   GLUCOSE 137 (H) 04/22/2019   CHOL 191 09/08/2018   TRIG 120.0 09/08/2018   HDL 44.30 09/08/2018   LDLDIRECT 180.9 07/26/2009   LDLCALC 123 (H) 09/08/2018   ALT 24 04/22/2019   AST 18 04/22/2019   NA 136 04/22/2019   K 4.9 04/22/2019   CL 100 04/22/2019   CREATININE 0.97 04/22/2019   BUN 17 04/22/2019   CO2 30 04/22/2019   TSH 1.03 04/22/2019   HGBA1C 7.0 (H) 04/22/2019   MICROALBUR 1.8 09/08/2018    DG Chest 2 View  Result Date: 05/31/2019 CLINICAL DATA:  Bilateral leg swelling x1 week, chronic prednisone use EXAM: CHEST - 2 VIEW COMPARISON:  02/09/2016 FINDINGS: Lungs are essentially clear. No frank interstitial edema. No pleural effusion or pneumothorax. The heart is top-normal in size. Mild degenerative changes of the visualized thoracolumbar spine. IMPRESSION: Normal chest radiographs. No frank interstitial edema. Electronically Signed   By: Julian Hy M.D.   On: 05/31/2019 11:40    Assessment & Plan:      Walker Kehr, MD

## 2020-02-23 NOTE — Assessment & Plan Note (Addendum)
Kdur - can't take Try regular KCl - dissolve in juce

## 2020-02-23 NOTE — Assessment & Plan Note (Signed)
Lipids 

## 2020-02-23 NOTE — Assessment & Plan Note (Signed)
Pt needs a good sleep Zolpidem prn  Potential benefits of a long term benzodiazepines  use as well as potential risks  and complications were explained to the patient and were aknowledged.

## 2020-02-24 ENCOUNTER — Encounter: Payer: Self-pay | Admitting: Physical Therapy

## 2020-02-24 ENCOUNTER — Ambulatory Visit: Payer: PPO | Admitting: Physical Therapy

## 2020-02-24 ENCOUNTER — Other Ambulatory Visit: Payer: Self-pay

## 2020-02-24 DIAGNOSIS — M25512 Pain in left shoulder: Secondary | ICD-10-CM | POA: Diagnosis not present

## 2020-02-24 DIAGNOSIS — G8929 Other chronic pain: Secondary | ICD-10-CM | POA: Diagnosis not present

## 2020-02-24 DIAGNOSIS — R600 Localized edema: Secondary | ICD-10-CM | POA: Diagnosis not present

## 2020-02-24 DIAGNOSIS — M25612 Stiffness of left shoulder, not elsewhere classified: Secondary | ICD-10-CM | POA: Diagnosis not present

## 2020-02-24 NOTE — Therapy (Signed)
[address] [place], [place], [address] Phone: [phone]   Fax:  [phone]  Physical Therapy Treatment  Patient Details  Name: Deanna Salazar MRN: [id number] Date of Birth: [birth year] Referring Provider (PT): Lynne Leader MD   Encounter Date: 02/24/2020   PT End of Session - 02/24/20 0936    Visit Number 5    Number of Visits 12    Progress Note Due on Visit 10    PT Start Time 0930    PT Stop Time 1012    PT Time Calculation (min) 42 min    Activity Tolerance Patient tolerated treatment well    Behavior During Therapy [facility] for tasks assessed/performed           Past Medical History:  Diagnosis Date  . Abdominal pain, generalized 02/05/2008  . ABDOMINAL PAIN-EPIGASTRIC 03/24/2009  . Acute bronchitis 02/15/2007  . Adenomatous colon polyp 04/2000  . Arthritis   . ASCUS favor benign 10/2011   negative high risk HPV screen  . BACK PAIN 07/20/2008  . Breast cancer ([place]) 2009   l, hx of XRT Dr Benay Spice, Left side  . BREAST CANCER, HX OF 04/16/2007  . CHEST PAIN 02/23/2010  . COLONIC POLYPS, ADENOMATOUS, HX OF 10/01/2007  . CONSTIPATION, CHRONIC 05/10/2007  . Dysuria 04/06/2008  . FATIGUE 12/26/2006  . Fatty liver   . FATTY LIVER DISEASE 11/19/2006  . FIBROMYALGIA 11/19/2006  . GERD 11/19/2006   Dr Fuller Plan  . Glaucoma 07/2017   left eye  . HEMATOCHEZIA 05/10/2007  . HEMORRHOIDS, INTERNAL 05/10/2007  . HIP PAIN 07/20/2008  . HYPERLIPIDEMIA 07/26/2009  . HYPERTENSION 12/26/2006  . HYPOKALEMIA 11/25/2007  . HYPOTHYROIDISM 12/26/2006  . IBS 11/19/2006  . INSOMNIA, PERSISTENT 12/26/2006  . KELOID 07/25/2007  . MURMUR 07/25/2007  . Nausea alone 03/23/2010  . PARESTHESIA 04/06/2008  . Personal history of radiation therapy   . RENAL CYST, LEFT 02/23/2010  . Rheumatoid arthritis(714.0) 11/19/2006   Dr Vianne Bulls  . RUQ PAIN 11/07/2009  . Tubular adenoma   . VISION IMPAIRMENT, LOW VISION, ONE EYE-LEFT 07/25/2007    Past Surgical  History:  Procedure Laterality Date  . BREAST LUMPECTOMY Left 2009  . BREAST SURGERY  2009   Left lumpectomy  . COLONOSCOPY  06/12/2012  . Cyst excision from chest    . knee surgery Left   . Left thyroidectomy    . OS Cataract extraction  2009  . OVARIAN CYST REMOVAL  age 42  . SHOULDER SURGERY     both shoulders  . TOE SURGERY    . TUBAL LIGATION      There were no vitals filed for this visit.   Subjective Assessment - 02/24/20 0935    Subjective Pt arriving to therapy reporting 2/10 pain in left shoulder.    Pertinent History DM, RA, L shoulder surgery rotator cuff repair, R shoulder surgery, tyroid surgery, left knee surgery, cataracts bilateral surgery, tubes tied, cyst removal    Patient Stated Goals Stop hurting, be able to move my arm without pain    Currently in Pain? Yes    Pain Score 2     Pain Location Shoulder    Pain Orientation Left    Pain Descriptors / Indicators Aching    Pain Type Acute pain    Pain Onset More than a month ago    Pain Frequency Constant  [facility] Adult PT Treatment/Exercise - 02/24/20 0001      Shoulder Exercises: Supine   External Rotation AAROM;Left;20 reps    Flexion AAROM;Both;20 reps    Flexion Limitations 2# bar    Other Supine Exercises serratus punches with L UE 2x10   using 2# bar with bilateral UE's   Other Supine Exercises bicep curls x 20 using 1# weight, #chest press 1# weight x 10 L UE only      Shoulder Exercises: Standing   Other Standing Exercises UE ranger flexion/ circles    Other Standing Exercises wall slides L UE x 15 reps      Shoulder Exercises: Pulleys   Flexion 2 minutes    Scaption 2 minutes      Cryotherapy   Number Minutes Cryotherapy --   Pt stated she could ice at home     Manual Therapy   Manual therapy comments PROM L shoulder                    PT Short Term Goals - 02/24/20 0946      PT SHORT TERM GOAL #1   Title Pt will be independent  in her HEP.    Status Achieved             PT Long Term Goals - 02/24/20 0946      PT LONG TERM GOAL #1   Title Pt will improve her FOTO score to >/= 59 % function.    Status On-going      PT LONG TERM GOAL #2   Title Pt will be able to lift her arm to performing feeding without pain.    Baseline pt reporting pain with weighted objects when lifting (for example a glass of water causes pain)    Status On-going      PT LONG TERM GOAL #3   Title Pt will be albe lift her L UE  to fix her hair without assistance from the R UE with pain </= 2/10.      PT LONG TERM GOAL #4   Title Pt will improve her left grip strengh to >/= 60ppsi in order to help with functional mobility and ADL's.    Status On-going                 Plan - 02/24/20 0941    Clinical Impression Statement Pt arriving today reporting 2/10 pain in L anterior shoulder. Pt reporting HEP compliance. Pt with shoulder fatigue noted during standing shoulder activities today. Pt also with mild discomfort with end range of flexion and ER. Pt reporting she is only having to take her pain meds 1-2 times each day usually in the evening.  Recommneded continue skilled PT to progress toward goals set.    Personal Factors and Comorbidities Age;Comorbidity 3+    Comorbidities DM, RA, L shoulder surgery rotator cuff repair, R shoulder surgery, tyroid surgery, left knee surgery, cataracts bilateral surgery, tubes tied, cyst removal    Examination-Activity Limitations Carry;Dressing;Hygiene/Grooming;Reach Overhead;Sleep    Examination-Participation Restrictions Community Activity;Other;Laundry    Rehab Potential Good    PT Frequency 2x / week    PT Duration 6 weeks    PT Treatment/Interventions ADLs/Self Care Home Management;Cryotherapy;Electrical Stimulation;Iontophoresis 4mg /ml Dexamethasone;Moist Heat;Traction;Ultrasound;Functional mobility training;Therapeutic activities;Therapeutic exercise;Neuromuscular  re-education;Patient/family education;Manual techniques;Passive range of motion;Dry needling;Vasopneumatic Device;Taping    PT Next Visit Plan ROM, shoulder ROM, gentle strengthening    PT Home Exercise Plan Access Code: [email or web address]  Patient will benefit from skilled therapeutic intervention in order to improve the following deficits and impairments:  Pain,Increased edema,Decreased range of motion,Decreased strength,Impaired UE functional use,Postural dysfunction  Visit Diagnosis: Chronic left shoulder pain  Stiffness of left shoulder, not elsewhere classified  Localized edema     Problem List Patient Active Problem List   Diagnosis Date Noted  . Nightmares 04/22/2019  . Exposure to COVID-19 virus 10/09/2018  . Cough 10/09/2018  . Grief 11/23/2016  . Unilateral primary osteoarthritis, right knee 08/23/2016  . Hypokalemia 05/16/2016  . Rhonchi 02/09/2016  . Intertrigo 10/04/2015  . Allergic rhinitis 04/05/2015  . Well adult exam 06/28/2014  . Tinea pedis 10/13/2013  . Near syncope 09/28/2013  . LBP (low back pain) 09/24/2012  . Diabetes type 2, controlled ([place]) 11/06/2011  . Breast cancer ([place]) 11/06/2011  . URI, acute 03/02/2011  . Flank pain 03/02/2011  . Hyperglycemia 03/02/2011  . Edema 10/30/2010  . Dyspnea and respiratory abnormalities 10/30/2010  . Cramp in limb 10/30/2010  . Bronchitis 10/06/2010  . NAUSEA ALONE 03/23/2010  . RENAL CYST, LEFT 02/23/2010  . Chest pain 02/23/2010  . Dyslipidemia 07/26/2009  . ABDOMINAL PAIN-EPIGASTRIC 03/24/2009  . HIP PAIN 07/20/2008  . BACK PAIN 07/20/2008  . FEVER, NOS 07/20/2008  . PARESTHESIA 04/06/2008  . Dysuria 04/06/2008  . Dysphagia, pharyngoesophageal phase 02/05/2008  . ABDOMINAL PAIN, GENERALIZED 02/05/2008  . HYPOKALEMIA 11/25/2007  . Depression 11/25/2007  . COLONIC POLYPS, ADENOMATOUS, HX OF 10/01/2007  . 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 KELOID 07/25/2007  . MURMUR  07/25/2007  . HEMORRHOIDS, INTERNAL 05/10/2007  . CONSTIPATION, CHRONIC 05/10/2007  . BREAST CANCER, HX OF 04/16/2007  . ACUTE BRONCHITIS 02/15/2007  . Hypothyroidism 12/26/2006  . INSOMNIA, PERSISTENT 12/26/2006  . Essential hypertension 12/26/2006  . Chronic fatigue 12/26/2006  . GERD 11/19/2006  . IBS 11/19/2006  . FATTY LIVER DISEASE 11/19/2006  . Rheumatoid arthritis ([place]) 11/19/2006  . Fibromyalgia 11/19/2006    Oretha Caprice, PT, MPT 02/24/2020, 10:14 AM  [facility] Physical Therapy [address] [place], [place], [address] Phone: [phone]   Fax:  [phone]  Name: Deanna Salazar MRN: [id number] Date of Birth: 11-26-1940

## 2020-02-25 ENCOUNTER — Encounter: Payer: Self-pay | Admitting: Internal Medicine

## 2020-02-25 DIAGNOSIS — N183 Chronic kidney disease, stage 3 unspecified: Secondary | ICD-10-CM | POA: Insufficient documentation

## 2020-02-29 ENCOUNTER — Ambulatory Visit (INDEPENDENT_AMBULATORY_CARE_PROVIDER_SITE_OTHER): Payer: PPO | Admitting: Physical Therapy

## 2020-02-29 ENCOUNTER — Other Ambulatory Visit: Payer: Self-pay

## 2020-02-29 ENCOUNTER — Encounter: Payer: Self-pay | Admitting: Physical Therapy

## 2020-02-29 DIAGNOSIS — G8929 Other chronic pain: Secondary | ICD-10-CM

## 2020-02-29 DIAGNOSIS — M25512 Pain in left shoulder: Secondary | ICD-10-CM | POA: Diagnosis not present

## 2020-02-29 DIAGNOSIS — R600 Localized edema: Secondary | ICD-10-CM | POA: Diagnosis not present

## 2020-02-29 DIAGNOSIS — M25612 Stiffness of left shoulder, not elsewhere classified: Secondary | ICD-10-CM | POA: Diagnosis not present

## 2020-02-29 NOTE — Therapy (Signed)
[place], [place], [address] Phone: [phone]   Fax:  [phone]  Physical Therapy Treatment  Patient Details  Name: Deanna Salazar MRN: [id number] Date of Birth: 15-Aug-1940 Referring Provider (PT): Lynne Leader MD   Encounter Date: 02/29/2020   PT End of Session - 02/29/20 1008    Visit Number 6    Number of Visits 12    Date for PT Re-Evaluation 03/18/20    Progress Note Due on Visit 10    PT Start Time 0935    PT Stop Time 1014    PT Time Calculation (min) 39 min    Activity Tolerance Patient tolerated treatment well    Behavior During Therapy [facility] for tasks assessed/performed           Past Medical History:  Diagnosis Date  . Abdominal pain, generalized 02/05/2008  . ABDOMINAL PAIN-EPIGASTRIC 03/24/2009  . Acute bronchitis 02/15/2007  . Adenomatous colon polyp 04/2000  . Arthritis   . ASCUS favor benign 10/2011   negative high risk HPV screen  . BACK PAIN 07/20/2008  . Breast cancer ([place]) 2009   l, hx of XRT Dr Benay Spice, Left side  . BREAST CANCER, HX OF 04/16/2007  . CHEST PAIN 02/23/2010  . COLONIC POLYPS, ADENOMATOUS, HX OF 10/01/2007  . CONSTIPATION, CHRONIC 05/10/2007  . Dysuria 04/06/2008  . FATIGUE 12/26/2006  . Fatty liver   . FATTY LIVER DISEASE 11/19/2006  . FIBROMYALGIA 11/19/2006  . GERD 11/19/2006   Dr Fuller Plan  . Glaucoma 07/2017   left eye  . HEMATOCHEZIA 05/10/2007  . HEMORRHOIDS, INTERNAL 05/10/2007  . HIP PAIN 07/20/2008  . HYPERLIPIDEMIA 07/26/2009  . HYPERTENSION 12/26/2006  . HYPOKALEMIA 11/25/2007  . HYPOTHYROIDISM 12/26/2006  . IBS 11/19/2006  . INSOMNIA, PERSISTENT 12/26/2006  . KELOID 07/25/2007  . MURMUR 07/25/2007  . Nausea alone 03/23/2010  . PARESTHESIA 04/06/2008  . Personal history of radiation therapy   . RENAL CYST, LEFT 02/23/2010  . Rheumatoid arthritis(714.0) 11/19/2006   Dr Vianne Bulls  . RUQ PAIN 11/07/2009  . Tubular adenoma   . VISION IMPAIRMENT, LOW VISION, ONE  EYE-LEFT 07/25/2007    Past Surgical History:  Procedure Laterality Date  . BREAST LUMPECTOMY Left 2009  . BREAST SURGERY  2009   Left lumpectomy  . COLONOSCOPY  06/12/2012  . Cyst excision from chest    . knee surgery Left   . Left thyroidectomy    . OS Cataract extraction  2009  . OVARIAN CYST REMOVAL  age 86  . SHOULDER SURGERY     both shoulders  . TOE SURGERY    . TUBAL LIGATION      There were no vitals filed for this visit.   Subjective Assessment - 02/29/20 1003    Subjective [name] arrived today reporting 5/10 pain in L shoulder. Pt reported lifting a case of bottle water and her pain increased her to the point of tears that night.    Pertinent History DM, RA, L shoulder surgery rotator cuff repair, R shoulder surgery, tyroid surgery, left knee surgery, cataracts bilateral surgery, tubes tied, cyst removal    Patient Stated Goals Stop hurting, be able to move my arm without pain    Currently in Pain? Yes    Pain Score 5     Pain Location Shoulder    Pain Descriptors / Indicators Aching;Sore    Pain Type Acute pain    Pain Onset More than a month ago    Pain Frequency  Constant                             [facility] Adult PT Treatment/Exercise - 02/29/20 0001      Shoulder Exercises: Supine   External Rotation AAROM;Left;20 reps    Flexion AAROM;Both;20 reps    Flexion Limitations 3#    Other Supine Exercises serratus punches with L UE 2x10   using 2# bar with bilateral UE's     Shoulder Exercises: Standing   Other Standing Exercises UE ranger flexion/ circles    Other Standing Exercises wall slides L UE x 15 reps      Shoulder Exercises: Pulleys   Flexion 2 minutes    Scaption 2 minutes      Shoulder Exercises: Isometric Strengthening   Flexion 5X5";Limitations    Flexion Limitations standing    External Rotation 5X5";Limitations    External Rotation Limitations standing    Internal Rotation 5X5"    Internal Rotation Limitations standing     ABduction 5X5"    ABduction Limitations standing      Manual Therapy   Manual therapy comments PROM L shoulder                    PT Short Term Goals - 02/24/20 0946      PT SHORT TERM GOAL #1   Title Pt will be independent in her HEP.    Status Achieved             PT Long Term Goals - 02/24/20 0946      PT LONG TERM GOAL #1   Title Pt will improve her FOTO score to >/= 59 % function.    Status On-going      PT LONG TERM GOAL #2   Title Pt will be able to lift her arm to performing feeding without pain.    Baseline pt reporting pain with weighted objects when lifting (for example a glass of water causes pain)    Status On-going      PT LONG TERM GOAL #3   Title Pt will be albe lift her L UE  to fix her hair without assistance from the R UE with pain </= 2/10.      PT LONG TERM GOAL #4   Title Pt will improve her left grip strengh to >/= 60ppsi in order to help with functional mobility and ADL's.    Status On-going                 Plan - 02/29/20 1008    Clinical Impression Statement Pt arriving today reporting increased pain over the weekend after lifting a case of bottle water. Pt reporting 5/10 pain today in L shoulder. Pt tolerating exercises well with no reports of increased pain. Good response to standing isometrics with some soreness with ER. Continue to progress pt as tolerated.    Personal Factors and Comorbidities Age;Comorbidity 3+    Comorbidities DM, RA, L shoulder surgery rotator cuff repair, R shoulder surgery, tyroid surgery, left knee surgery, cataracts bilateral surgery, tubes tied, cyst removal    Examination-Activity Limitations Carry;Dressing;Hygiene/Grooming;Reach Overhead;Sleep    Examination-Participation Restrictions Community Activity;Other;Laundry    Stability/Clinical Decision Making Stable/Uncomplicated    Rehab Potential Good    PT Frequency 2x / week    PT Duration 6 weeks    PT Treatment/Interventions ADLs/Self Care  Home Management;Cryotherapy;Electrical Stimulation;Iontophoresis 4mg /ml Dexamethasone;Moist Heat;Traction;Ultrasound;Functional mobility training;Therapeutic activities;Therapeutic exercise;Neuromuscular re-education;Patient/family education;Manual techniques;Passive  range of motion;Dry needling;Vasopneumatic Device;Taping    PT Next Visit Plan ROM, shoulder ROM, gentle strengthening    PT Home Exercise Plan Access Code: [email or web address]    Consulted and Agree with Plan of Care Patient           Patient will benefit from skilled therapeutic intervention in order to improve the following deficits and impairments:  Pain,Increased edema,Decreased range of motion,Decreased strength,Impaired UE functional use,Postural dysfunction  Visit Diagnosis: Chronic left shoulder pain  Stiffness of left shoulder, not elsewhere classified  Localized edema     Problem List Patient Active Problem List   Diagnosis Date Noted  . CRF (chronic renal failure), stage 3 (moderate) ([place]) 02/25/2020  . Nightmares 04/22/2019  . Exposure to COVID-19 virus 10/09/2018  . Cough 10/09/2018  . Grief 11/23/2016  . Unilateral primary osteoarthritis, right knee 08/23/2016  . Hypokalemia 05/16/2016  . Rhonchi 02/09/2016  . Intertrigo 10/04/2015  . Allergic rhinitis 04/05/2015  . Well adult exam 06/28/2014  . Tinea pedis 10/13/2013  . Near syncope 09/28/2013  . LBP (low back pain) 09/24/2012  . Diabetes type 2, controlled ([place]) 11/06/2011  . Breast cancer ([place]) 11/06/2011  . URI, acute 03/02/2011  . Flank pain 03/02/2011  . Hyperglycemia 03/02/2011  . Edema 10/30/2010  . Dyspnea and respiratory abnormalities 10/30/2010  . Cramp in limb 10/30/2010  . Bronchitis 10/06/2010  . NAUSEA ALONE 03/23/2010  . RENAL CYST, LEFT 02/23/2010  . Chest pain 02/23/2010  . Dyslipidemia 07/26/2009  . ABDOMINAL PAIN-EPIGASTRIC 03/24/2009  . HIP PAIN 07/20/2008  . BACK PAIN 07/20/2008  . FEVER, NOS 07/20/2008  . PARESTHESIA  04/06/2008  . Dysuria 04/06/2008  . Dysphagia, pharyngoesophageal phase 02/05/2008  . ABDOMINAL PAIN, GENERALIZED 02/05/2008  . HYPOKALEMIA 11/25/2007  . Depression 11/25/2007  . COLONIC POLYPS, ADENOMATOUS, HX OF 10/01/2007  . 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 KELOID 07/25/2007  . MURMUR 07/25/2007  . HEMORRHOIDS, INTERNAL 05/10/2007  . CONSTIPATION, CHRONIC 05/10/2007  . BREAST CANCER, HX OF 04/16/2007  . ACUTE BRONCHITIS 02/15/2007  . Hypothyroidism 12/26/2006  . INSOMNIA, PERSISTENT 12/26/2006  . Essential hypertension 12/26/2006  . Chronic fatigue 12/26/2006  . GERD 11/19/2006  . IBS 11/19/2006  . FATTY LIVER DISEASE 11/19/2006  . Rheumatoid arthritis ([place]) 11/19/2006  . Fibromyalgia 11/19/2006    Oretha Caprice, PT, MPT 02/29/2020, 10:20 AM  [facility] Physical Therapy [address] [place], [place], [address] Phone: [phone]   Fax:  [phone]  Name: Deanna Salazar MRN: [id number] Date of Birth: 12-02-1940

## 2020-03-02 ENCOUNTER — Ambulatory Visit: Payer: PPO | Admitting: Physical Therapy

## 2020-03-02 ENCOUNTER — Other Ambulatory Visit: Payer: Self-pay

## 2020-03-02 ENCOUNTER — Encounter: Payer: Self-pay | Admitting: Physical Therapy

## 2020-03-02 DIAGNOSIS — M25512 Pain in left shoulder: Secondary | ICD-10-CM

## 2020-03-02 DIAGNOSIS — R600 Localized edema: Secondary | ICD-10-CM | POA: Diagnosis not present

## 2020-03-02 DIAGNOSIS — G8929 Other chronic pain: Secondary | ICD-10-CM

## 2020-03-02 DIAGNOSIS — M25612 Stiffness of left shoulder, not elsewhere classified: Secondary | ICD-10-CM | POA: Diagnosis not present

## 2020-03-02 NOTE — Therapy (Signed)
[place], [place], [address] Phone: [phone]   Fax:  [phone]  Physical Therapy Treatment  Patient Details  Name: Deanna Salazar MRN: [id number] Date of Birth: 12-May-1940 Referring Provider (PT): Lynne Leader MD   Encounter Date: 03/02/2020   PT End of Session - 03/02/20 0936    Visit Number 7    Number of Visits 12    Date for PT Re-Evaluation 03/18/20    Progress Note Due on Visit 10    PT Start Time 0930    PT Stop Time 1010    PT Time Calculation (min) 40 min    Activity Tolerance Patient tolerated treatment well    Behavior During Therapy [facility] for tasks assessed/performed           Past Medical History:  Diagnosis Date  . Abdominal pain, generalized 02/05/2008  . ABDOMINAL PAIN-EPIGASTRIC 03/24/2009  . Acute bronchitis 02/15/2007  . Adenomatous colon polyp 04/2000  . Arthritis   . ASCUS favor benign 10/2011   negative high risk HPV screen  . BACK PAIN 07/20/2008  . Breast cancer ([place]) 2009   l, hx of XRT Dr Benay Spice, Left side  . BREAST CANCER, HX OF 04/16/2007  . CHEST PAIN 02/23/2010  . COLONIC POLYPS, ADENOMATOUS, HX OF 10/01/2007  . CONSTIPATION, CHRONIC 05/10/2007  . Dysuria 04/06/2008  . FATIGUE 12/26/2006  . Fatty liver   . FATTY LIVER DISEASE 11/19/2006  . FIBROMYALGIA 11/19/2006  . GERD 11/19/2006   Dr Fuller Plan  . Glaucoma 07/2017   left eye  . HEMATOCHEZIA 05/10/2007  . HEMORRHOIDS, INTERNAL 05/10/2007  . HIP PAIN 07/20/2008  . HYPERLIPIDEMIA 07/26/2009  . HYPERTENSION 12/26/2006  . HYPOKALEMIA 11/25/2007  . HYPOTHYROIDISM 12/26/2006  . IBS 11/19/2006  . INSOMNIA, PERSISTENT 12/26/2006  . KELOID 07/25/2007  . MURMUR 07/25/2007  . Nausea alone 03/23/2010  . PARESTHESIA 04/06/2008  . Personal history of radiation therapy   . RENAL CYST, LEFT 02/23/2010  . Rheumatoid arthritis(714.0) 11/19/2006   Dr Vianne Bulls  . RUQ PAIN 11/07/2009  . Tubular adenoma   . VISION IMPAIRMENT, LOW VISION, ONE  EYE-LEFT 07/25/2007    Past Surgical History:  Procedure Laterality Date  . BREAST LUMPECTOMY Left 2009  . BREAST SURGERY  2009   Left lumpectomy  . COLONOSCOPY  06/12/2012  . Cyst excision from chest    . knee surgery Left   . Left thyroidectomy    . OS Cataract extraction  2009  . OVARIAN CYST REMOVAL  age 80  . SHOULDER SURGERY     both shoulders  . TOE SURGERY    . TUBAL LIGATION      There were no vitals filed for this visit.   Subjective Assessment - 03/02/20 0935    Subjective Pt arriving today reporting 4/10 pain in her left shoulder. Pt reporting sleeping has improved.    Pertinent History DM, RA, L shoulder surgery rotator cuff repair, R shoulder surgery, tyroid surgery, left knee surgery, cataracts bilateral surgery, tubes tied, cyst removal    Diagnostic tests X-ray    Patient Stated Goals Stop hurting, be able to move my arm without pain    Currently in Pain? Yes    Pain Score 4     Pain Location Shoulder    Pain Orientation Left    Pain Descriptors / Indicators Aching;Sore    Pain Type Acute pain    Pain Onset More than a month ago  [facility] PT Assessment - 03/02/20 0001      Assessment   Medical Diagnosis M25.512, L shoulder pain, M54.12 cervical radiculitis    Referring Provider (PT) Lynne Leader MD      AROM   Overall AROM Comments performed in spine    AROM Assessment Site Shoulder    Right/Left Shoulder Left    Left Shoulder Extension 55 Degrees    Left Shoulder Flexion 160 Degrees    Left Shoulder ABduction 152 Degrees    Left Shoulder External Rotation 65 Degrees   elbow abducted 45 degrees     Strength   Overall Strength Comments L grip: 61 ppsi                         [facility] Adult PT Treatment/Exercise - 03/02/20 0001      Shoulder Exercises: Supine   External Rotation AAROM;Left;20 reps    Flexion AAROM;Both;20 reps      Shoulder Exercises: Sidelying   External Rotation AROM;Strengthening;Left;15 reps     External Rotation Weight (lbs) 2#      Shoulder Exercises: Standing   External Rotation Strengthening;AROM;Left;20 reps;Theraband    Theraband Level (Shoulder External Rotation) Level 2 (Red)    Flexion Strengthening;AROM;Both;20 reps    Shoulder Flexion Weight (lbs) 2# bar    Row Strengthening;Both;20 reps;Theraband    Theraband Level (Shoulder Row) Level 3 (Green)    Other Standing Exercises walking ball up wall x 10 using bilateral UE's, wall ladder x 5,                  PT Education - 03/02/20 0940    Education Details PT POC and visits    Person(s) Educated Patient    Methods Explanation    Comprehension Verbalized understanding            PT Short Term Goals - 03/02/20 0944      PT SHORT TERM GOAL #1   Title Pt will be independent in her HEP.    Status Achieved             PT Long Term Goals - 03/02/20 0944      PT LONG TERM GOAL #1   Title Pt will improve her FOTO score to >/= 59 % function.    Status On-going      PT LONG TERM GOAL #2   Title Pt will be able to lift her arm to performing feeding without pain.    Baseline pt reporting pain with weighted objects when lifting (for example a glass of water causes pain)    Status On-going      PT LONG TERM GOAL #3   Title Pt will be albe lift her L UE  to fix her hair without assistance from the R UE with pain </= 2/10.    Status On-going      PT LONG TERM GOAL #4   Title Pt will improve her left grip strengh to >/= 60ppsi in order to help with functional mobility and ADL's.    Baseline left grip: 61 ppsi on 03/02/2020    Time 6    Period Weeks    Status Achieved                 Plan - 03/02/20 0940    Clinical Impression Statement Pt was explained that she has 5 visits left of her original plan and pt reporting she will schedule today. Pt reporting 4/10 pain in  her left shoulder. Pt is still progressing with rotational movmements and overall strengthening. Continue to progress as tolerated  with skilled PT.    Personal Factors and Comorbidities Age;Comorbidity 3+    Comorbidities DM, RA, L shoulder surgery rotator cuff repair, R shoulder surgery, tyroid surgery, left knee surgery, cataracts bilateral surgery, tubes tied, cyst removal    Examination-Activity Limitations Carry;Dressing;Hygiene/Grooming;Reach Overhead;Sleep    Examination-Participation Restrictions Community Activity;Other;Laundry    Stability/Clinical Decision Making Stable/Uncomplicated    Rehab Potential Good    PT Frequency 2x / week    PT Duration 6 weeks    PT Treatment/Interventions ADLs/Self Care Home Management;Cryotherapy;Electrical Stimulation;Iontophoresis 4mg /ml Dexamethasone;Moist Heat;Traction;Ultrasound;Functional mobility training;Therapeutic activities;Therapeutic exercise;Neuromuscular re-education;Patient/family education;Manual techniques;Passive range of motion;Dry needling;Vasopneumatic Device;Taping    PT Next Visit Plan ROM, shoulder ROM, gentle strengthening    Consulted and Agree with Plan of Care Patient           Patient will benefit from skilled therapeutic intervention in order to improve the following deficits and impairments:  Pain,Increased edema,Decreased range of motion,Decreased strength,Impaired UE functional use,Postural dysfunction  Visit Diagnosis: Chronic left shoulder pain  Stiffness of left shoulder, not elsewhere classified  Localized edema     Problem List Patient Active Problem List   Diagnosis Date Noted  . CRF (chronic renal failure), stage 3 (moderate) ([place]) 02/25/2020  . Nightmares 04/22/2019  . Exposure to COVID-19 virus 10/09/2018  . Cough 10/09/2018  . Grief 11/23/2016  . Unilateral primary osteoarthritis, right knee 08/23/2016  . Hypokalemia 05/16/2016  . Rhonchi 02/09/2016  . Intertrigo 10/04/2015  . Allergic rhinitis 04/05/2015  . Well adult exam 06/28/2014  . Tinea pedis 10/13/2013  . Near syncope 09/28/2013  . LBP (low back pain)  09/24/2012  . Diabetes type 2, controlled ([place]) 11/06/2011  . Breast cancer ([place]) 11/06/2011  . URI, acute 03/02/2011  . Flank pain 03/02/2011  . Hyperglycemia 03/02/2011  . Edema 10/30/2010  . Dyspnea and respiratory abnormalities 10/30/2010  . Cramp in limb 10/30/2010  . Bronchitis 10/06/2010  . NAUSEA ALONE 03/23/2010  . RENAL CYST, LEFT 02/23/2010  . Chest pain 02/23/2010  . Dyslipidemia 07/26/2009  . ABDOMINAL PAIN-EPIGASTRIC 03/24/2009  . HIP PAIN 07/20/2008  . BACK PAIN 07/20/2008  . FEVER, NOS 07/20/2008  . PARESTHESIA 04/06/2008  . Dysuria 04/06/2008  . Dysphagia, pharyngoesophageal phase 02/05/2008  . ABDOMINAL PAIN, GENERALIZED 02/05/2008  . HYPOKALEMIA 11/25/2007  . Depression 11/25/2007  . COLONIC POLYPS, ADENOMATOUS, HX OF 10/01/2007  . 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 KELOID 07/25/2007  . MURMUR 07/25/2007  . HEMORRHOIDS, INTERNAL 05/10/2007  . CONSTIPATION, CHRONIC 05/10/2007  . BREAST CANCER, HX OF 04/16/2007  . ACUTE BRONCHITIS 02/15/2007  . Hypothyroidism 12/26/2006  . INSOMNIA, PERSISTENT 12/26/2006  . Essential hypertension 12/26/2006  . Chronic fatigue 12/26/2006  . GERD 11/19/2006  . IBS 11/19/2006  . FATTY LIVER DISEASE 11/19/2006  . Rheumatoid arthritis ([place]) 11/19/2006  . Fibromyalgia 11/19/2006    Oretha Caprice, PT, MPT 03/02/2020, 10:07 AM  [facility] Physical Therapy [address] [place], [place], [address] Phone: [phone]   Fax:  [phone]  Name: TERALYN MULLINS MRN: [id number] Date of Birth: [birth year]

## 2020-03-11 ENCOUNTER — Telehealth: Payer: Self-pay | Admitting: *Deleted

## 2020-03-11 NOTE — Telephone Encounter (Signed)
Rec'd PA for Zolpidem 5 mg. Completed via cover-my-,med w/ (Key: [id number]). Rec'd msg stating " This request is still being processed. Please check by 48-72 hours for decision making"...Deanna Salazar

## 2020-03-14 ENCOUNTER — Encounter: Payer: PPO | Admitting: Physical Therapy

## 2020-03-14 NOTE — Telephone Encounter (Signed)
Rec'd coverage determination med was approved through 01/07/21. Faxed approval to [email or web address] KitchenLind Guest

## 2020-03-16 ENCOUNTER — Ambulatory Visit (INDEPENDENT_AMBULATORY_CARE_PROVIDER_SITE_OTHER): Payer: PPO | Admitting: Physical Therapy

## 2020-03-16 ENCOUNTER — Other Ambulatory Visit: Payer: Self-pay

## 2020-03-16 ENCOUNTER — Encounter: Payer: Self-pay | Admitting: Physical Therapy

## 2020-03-16 DIAGNOSIS — M25512 Pain in left shoulder: Secondary | ICD-10-CM | POA: Diagnosis not present

## 2020-03-16 DIAGNOSIS — M25612 Stiffness of left shoulder, not elsewhere classified: Secondary | ICD-10-CM

## 2020-03-16 DIAGNOSIS — G8929 Other chronic pain: Secondary | ICD-10-CM | POA: Diagnosis not present

## 2020-03-16 DIAGNOSIS — R600 Localized edema: Secondary | ICD-10-CM

## 2020-03-16 NOTE — Therapy (Signed)
[place], [place], [address] Phone: [phone]   Fax:  [phone]  Physical Therapy Treatment  Patient Details  Name: Deanna Salazar MRN: [id number] Date of Birth: 15-Oct-1940 Referring Provider (PT): Lynne Leader MD   Encounter Date: 03/16/2020   PT End of Session - 03/16/20 0811    Visit Number 8    Number of Visits 12    Date for PT Re-Evaluation 03/18/20    Progress Note Due on Visit 10    PT Start Time 0801    PT Stop Time 0842    PT Time Calculation (min) 41 min    Activity Tolerance Patient tolerated treatment well    Behavior During Therapy [facility] for tasks assessed/performed           Past Medical History:  Diagnosis Date  . Abdominal pain, generalized 02/05/2008  . ABDOMINAL PAIN-EPIGASTRIC 03/24/2009  . Acute bronchitis 02/15/2007  . Adenomatous colon polyp 04/2000  . Arthritis   . ASCUS favor benign 10/2011   negative high risk HPV screen  . BACK PAIN 07/20/2008  . Breast cancer ([place]) 2009   l, hx of XRT Dr Benay Spice, Left side  . BREAST CANCER, HX OF 04/16/2007  . CHEST PAIN 02/23/2010  . COLONIC POLYPS, ADENOMATOUS, HX OF 10/01/2007  . CONSTIPATION, CHRONIC 05/10/2007  . Dysuria 04/06/2008  . FATIGUE 12/26/2006  . Fatty liver   . FATTY LIVER DISEASE 11/19/2006  . FIBROMYALGIA 11/19/2006  . GERD 11/19/2006   Dr Fuller Plan  . Glaucoma 07/2017   left eye  . HEMATOCHEZIA 05/10/2007  . HEMORRHOIDS, INTERNAL 05/10/2007  . HIP PAIN 07/20/2008  . HYPERLIPIDEMIA 07/26/2009  . HYPERTENSION 12/26/2006  . HYPOKALEMIA 11/25/2007  . HYPOTHYROIDISM 12/26/2006  . IBS 11/19/2006  . INSOMNIA, PERSISTENT 12/26/2006  . KELOID 07/25/2007  . MURMUR 07/25/2007  . Nausea alone 03/23/2010  . PARESTHESIA 04/06/2008  . Personal history of radiation therapy   . RENAL CYST, LEFT 02/23/2010  . Rheumatoid arthritis(714.0) 11/19/2006   Dr Vianne Bulls  . RUQ PAIN 11/07/2009  . Tubular adenoma   . VISION IMPAIRMENT, LOW VISION, ONE EYE-LEFT  07/25/2007    Past Surgical History:  Procedure Laterality Date  . BREAST LUMPECTOMY Left 2009  . BREAST SURGERY  2009   Left lumpectomy  . COLONOSCOPY  06/12/2012  . Cyst excision from chest    . knee surgery Left   . Left thyroidectomy    . OS Cataract extraction  2009  . OVARIAN CYST REMOVAL  age 62  . SHOULDER SURGERY     both shoulders  . TOE SURGERY    . TUBAL LIGATION      There were no vitals filed for this visit.   Subjective Assessment - 03/16/20 0808    Subjective Pt arriving reporting no sleep last night due to shoulder pain. Pt stating her pain upon arrival was 6/10.    Pertinent History DM, RA, L shoulder surgery rotator cuff repair, R shoulder surgery, tyroid surgery, left knee surgery, cataracts bilateral surgery, tubes tied, cyst removal    Diagnostic tests X-ray    Patient Stated Goals Stop hurting, be able to move my arm without pain    Currently in Pain? Yes    Pain Score 6     Pain Location Shoulder    Pain Orientation Left    Pain Descriptors / Indicators Aching;Sore    Pain Onset More than a month ago  [facility] PT Assessment - 03/16/20 0001      Assessment   Medical Diagnosis M25.512, L shoulder pain, M54.12 cervical radiculitis    Referring Provider (PT) Lynne Leader MD      AROM   Overall AROM Comments supine    AROM Assessment Site Shoulder    Right/Left Shoulder Left    Left Shoulder Flexion 160 Degrees    Left Shoulder External Rotation 62 Degrees   supine elbow at 90 degrees                        [facility] Adult PT Treatment/Exercise - 03/16/20 0001      Shoulder Exercises: Sidelying   External Rotation AROM;Strengthening;Left;15 reps    External Rotation Weight (lbs) 2#      Shoulder Exercises: Standing   External Rotation Strengthening;AROM;Left;20 reps;Theraband    Theraband Level (Shoulder External Rotation) Level 2 (Red)    Flexion Strengthening;AROM;Both;20 reps    Shoulder Flexion Weight (lbs) 2# bar     Row Strengthening;Both;20 reps;Theraband    Theraband Level (Shoulder Row) Level 3 (Green)      Shoulder Exercises: Pulleys   Scaption 3 minutes      Shoulder Exercises: ROM/Strengthening   UBE (Upper Arm Bike) L1 2 minutes forward and 2 minutes back alternating each minute      Manual Therapy   Manual therapy comments PROM: grade 2-3 shoulder mobs, percussion to left shoulder                    PT Short Term Goals - 03/02/20 0944      PT SHORT TERM GOAL #1   Title Pt will be independent in her HEP.    Status Achieved             PT Long Term Goals - 03/16/20 0817      PT LONG TERM GOAL #1   Title Pt will improve her FOTO score to >/= 59 % function.    Status On-going      PT LONG TERM GOAL #2   Title Pt will be able to lift her arm to performing feeding without pain.    Baseline pt reporting pain with weighted objects when lifting (for example a glass of water causes pain)    Status On-going      PT LONG TERM GOAL #3   Title Pt will be albe lift her L UE  to fix her hair without assistance from the R UE with pain </= 2/10.    Status On-going      PT LONG TERM GOAL #4   Title Pt will improve her left grip strengh to >/= 60ppsi in order to help with functional mobility and ADL's.    Status Achieved                 Plan - 03/16/20 0813    Clinical Impression Statement Pt arriving reporting lack of sleep last night due to pain in left shoulder. Pt reporting 6/10 pain upon arrival. Pt feels it may have been from trying ER of her shoulder with 1# weight at home. Pt still stuggling with rotational components ER>IR.  Pt needs focusing on active ROM in seated agaiinst gravity. Pt tolerating exercises today ending with PROM and percussion to left shoulder. Pt reporting less pain at end of session. Pt reporting she is having to drop her visits to 1x/ week due to financial reasons. HEP reviewed. Pt reporting she has  a set of pulleys and bands for home  exercies. Continue skilled PT.    Personal Factors and Comorbidities Age;Comorbidity 3+    Comorbidities DM, RA, L shoulder surgery rotator cuff repair, R shoulder surgery, tyroid surgery, left knee surgery, cataracts bilateral surgery, tubes tied, cyst removal    Examination-Activity Limitations Carry;Dressing;Hygiene/Grooming;Reach Overhead;Sleep    Examination-Participation Restrictions Community Activity;Other;Laundry    Stability/Clinical Decision Making Stable/Uncomplicated    Rehab Potential Good    PT Frequency 2x / week    PT Duration 6 weeks    PT Treatment/Interventions ADLs/Self Care Home Management;Cryotherapy;Electrical Stimulation;Iontophoresis 4mg /ml Dexamethasone;Moist Heat;Traction;Ultrasound;Functional mobility training;Therapeutic activities;Therapeutic exercise;Neuromuscular re-education;Patient/family education;Manual techniques;Passive range of motion;Dry needling;Vasopneumatic Device;Taping    PT Next Visit Plan ROM, shoulder ROM, gentle strengthening    PT Home Exercise Plan Access Code: [email or web address]    Consulted and Agree with Plan of Care Patient           Patient will benefit from skilled therapeutic intervention in order to improve the following deficits and impairments:  Pain,Increased edema,Decreased range of motion,Decreased strength,Impaired UE functional use,Postural dysfunction  Visit Diagnosis: Chronic left shoulder pain  Stiffness of left shoulder, not elsewhere classified  Localized edema     Problem List Patient Active Problem List   Diagnosis Date Noted  . CRF (chronic renal failure), stage 3 (moderate) ([place]) 02/25/2020  . Nightmares 04/22/2019  . Exposure to COVID-19 virus 10/09/2018  . Cough 10/09/2018  . Grief 11/23/2016  . Unilateral primary osteoarthritis, right knee 08/23/2016  . Hypokalemia 05/16/2016  . Rhonchi 02/09/2016  . Intertrigo 10/04/2015  . Allergic rhinitis 04/05/2015  . Well adult exam 06/28/2014  . Tinea pedis  10/13/2013  . Near syncope 09/28/2013  . LBP (low back pain) 09/24/2012  . Diabetes type 2, controlled ([place]) 11/06/2011  . Breast cancer ([place]) 11/06/2011  . URI, acute 03/02/2011  . Flank pain 03/02/2011  . Hyperglycemia 03/02/2011  . Edema 10/30/2010  . Dyspnea and respiratory abnormalities 10/30/2010  . Cramp in limb 10/30/2010  . Bronchitis 10/06/2010  . NAUSEA ALONE 03/23/2010  . RENAL CYST, LEFT 02/23/2010  . Chest pain 02/23/2010  . Dyslipidemia 07/26/2009  . ABDOMINAL PAIN-EPIGASTRIC 03/24/2009  . HIP PAIN 07/20/2008  . BACK PAIN 07/20/2008  . FEVER, NOS 07/20/2008  . PARESTHESIA 04/06/2008  . Dysuria 04/06/2008  . Dysphagia, pharyngoesophageal phase 02/05/2008  . ABDOMINAL PAIN, GENERALIZED 02/05/2008  . HYPOKALEMIA 11/25/2007  . Depression 11/25/2007  . COLONIC POLYPS, ADENOMATOUS, HX OF 10/01/2007  . 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 KELOID 07/25/2007  . MURMUR 07/25/2007  . HEMORRHOIDS, INTERNAL 05/10/2007  . CONSTIPATION, CHRONIC 05/10/2007  . BREAST CANCER, HX OF 04/16/2007  . ACUTE BRONCHITIS 02/15/2007  . Hypothyroidism 12/26/2006  . INSOMNIA, PERSISTENT 12/26/2006  . Essential hypertension 12/26/2006  . Chronic fatigue 12/26/2006  . GERD 11/19/2006  . IBS 11/19/2006  . FATTY LIVER DISEASE 11/19/2006  . Rheumatoid arthritis ([place]) 11/19/2006  . Fibromyalgia 11/19/2006    Oretha Caprice, PT , MPT 03/16/2020, 8:37 AM  [facility] Physical Therapy [address] [place], [place], [address] Phone: [phone]   Fax:  [phone]  Name: Deanna Salazar MRN: [id number] Date of Birth: [birth year]

## 2020-03-17 DIAGNOSIS — M25561 Pain in right knee: Secondary | ICD-10-CM | POA: Diagnosis not present

## 2020-03-17 DIAGNOSIS — E669 Obesity, unspecified: Secondary | ICD-10-CM | POA: Diagnosis not present

## 2020-03-17 DIAGNOSIS — M1711 Unilateral primary osteoarthritis, right knee: Secondary | ICD-10-CM | POA: Diagnosis not present

## 2020-03-17 DIAGNOSIS — M0609 Rheumatoid arthritis without rheumatoid factor, multiple sites: Secondary | ICD-10-CM | POA: Diagnosis not present

## 2020-03-17 DIAGNOSIS — Z6834 Body mass index (BMI) 34.0-34.9, adult: Secondary | ICD-10-CM | POA: Diagnosis not present

## 2020-03-17 DIAGNOSIS — R600 Localized edema: Secondary | ICD-10-CM | POA: Diagnosis not present

## 2020-03-21 ENCOUNTER — Encounter: Payer: PPO | Admitting: Physical Therapy

## 2020-03-23 ENCOUNTER — Encounter: Payer: Self-pay | Admitting: Physical Therapy

## 2020-03-23 ENCOUNTER — Ambulatory Visit: Payer: PPO | Admitting: Physical Therapy

## 2020-03-23 ENCOUNTER — Other Ambulatory Visit: Payer: Self-pay

## 2020-03-23 DIAGNOSIS — G8929 Other chronic pain: Secondary | ICD-10-CM

## 2020-03-23 DIAGNOSIS — R600 Localized edema: Secondary | ICD-10-CM

## 2020-03-23 DIAGNOSIS — M25612 Stiffness of left shoulder, not elsewhere classified: Secondary | ICD-10-CM

## 2020-03-23 DIAGNOSIS — M25512 Pain in left shoulder: Secondary | ICD-10-CM | POA: Diagnosis not present

## 2020-03-23 NOTE — Therapy (Signed)
[facility] [place], [place], [address] Phone: [phone]   Fax:  [phone]  Physical Therapy Treatment  Patient Details  Name: Deanna Salazar MRN: [id number] Date of Birth: 03/06/1940 Referring Provider (PT): Lynne Leader MD   Encounter Date: 03/23/2020   PT End of Session - 03/23/20 1021    Visit Number 9    Number of Visits 12    Date for PT Re-Evaluation 03/18/20    Progress Note Due on Visit 10    PT Start Time 1015    PT Stop Time 1055    PT Time Calculation (min) 40 min    Activity Tolerance Patient tolerated treatment well    Behavior During Therapy [facility] for tasks assessed/performed           Past Medical History:  Diagnosis Date  . Abdominal pain, generalized 02/05/2008  . ABDOMINAL PAIN-EPIGASTRIC 03/24/2009  . Acute bronchitis 02/15/2007  . Adenomatous colon polyp 04/2000  . Arthritis   . ASCUS favor benign 10/2011   negative high risk HPV screen  . BACK PAIN 07/20/2008  . Breast cancer ([place]) 2009   l, hx of XRT Dr Benay Spice, Left side  . BREAST CANCER, HX OF 04/16/2007  . CHEST PAIN 02/23/2010  . COLONIC POLYPS, ADENOMATOUS, HX OF 10/01/2007  . CONSTIPATION, CHRONIC 05/10/2007  . Dysuria 04/06/2008  . FATIGUE 12/26/2006  . Fatty liver   . FATTY LIVER DISEASE 11/19/2006  . FIBROMYALGIA 11/19/2006  . GERD 11/19/2006   Dr Fuller Plan  . Glaucoma 07/2017   left eye  . HEMATOCHEZIA 05/10/2007  . HEMORRHOIDS, INTERNAL 05/10/2007  . HIP PAIN 07/20/2008  . HYPERLIPIDEMIA 07/26/2009  . HYPERTENSION 12/26/2006  . HYPOKALEMIA 11/25/2007  . HYPOTHYROIDISM 12/26/2006  . IBS 11/19/2006  . INSOMNIA, PERSISTENT 12/26/2006  . KELOID 07/25/2007  . MURMUR 07/25/2007  . Nausea alone 03/23/2010  . PARESTHESIA 04/06/2008  . Personal history of radiation therapy   . RENAL CYST, LEFT 02/23/2010  . Rheumatoid arthritis(714.0) 11/19/2006   Dr Vianne Bulls  . RUQ PAIN 11/07/2009  . Tubular adenoma   . VISION IMPAIRMENT, LOW VISION, ONE  EYE-LEFT 07/25/2007    Past Surgical History:  Procedure Laterality Date  . BREAST LUMPECTOMY Left 2009  . BREAST SURGERY  2009   Left lumpectomy  . COLONOSCOPY  06/12/2012  . Cyst excision from chest    . knee surgery Left   . Left thyroidectomy    . OS Cataract extraction  2009  . OVARIAN CYST REMOVAL  age 19  . SHOULDER SURGERY     both shoulders  . TOE SURGERY    . TUBAL LIGATION      There were no vitals filed for this visit.   Subjective Assessment - 03/23/20 1019    Subjective Pt arrivig today reproting 2/10 pain in her left shoulder. Pt reporting not sleeping well last night and she feels fatigued.    Pertinent History DM, RA, L shoulder surgery rotator cuff repair, R shoulder surgery, tyroid surgery, left knee surgery, cataracts bilateral surgery, tubes tied, cyst removal    Patient Stated Goals Stop hurting, be able to move my arm without pain    Currently in Pain? Yes    Pain Score 2     Pain Location Shoulder    Pain Orientation Left    Pain Descriptors / Indicators Aching    Pain Type Acute pain    Pain Onset More than a month ago  [facility] Adult PT Treatment/Exercise - 03/23/20 0001      Shoulder Exercises: Sidelying   External Rotation AROM;Strengthening;Left;15 reps    External Rotation Weight (lbs) 2#      Shoulder Exercises: Standing   External Rotation Strengthening;AROM;Left;20 reps;Theraband    Theraband Level (Shoulder External Rotation) Level 2 (Red)    Internal Rotation Strengthening;Left;20 reps;Theraband    Theraband Level (Shoulder Internal Rotation) Level 2 (Red)    Flexion Strengthening;AROM;Both;20 reps    Shoulder Flexion Weight (lbs) 2# bar    Row Strengthening;Both;20 reps;Theraband    Theraband Level (Shoulder Row) Level 3 (Green)      Shoulder Exercises: ROM/Strengthening   UBE (Upper Arm Bike) L1.5 3 minutes foward and back      Shoulder Exercises: Stretch   Table Stretch - External  Rotation 5 reps;10 seconds    Other Shoulder Stretches Standing ER stretch using #2 bar holding 5 seconds each x 15 reps      Manual Therapy   Manual therapy comments PROM: grade 2-3 shoulder mobs, percussion to left shoulder                    PT Short Term Goals - 03/02/20 0944      PT SHORT TERM GOAL #1   Title Pt will be independent in her HEP.    Status Achieved             PT Long Term Goals - 03/23/20 1037      PT LONG TERM GOAL #1   Title Pt will improve her FOTO score to >/= 59 % function.    Status On-going      PT LONG TERM GOAL #2   Title Pt will be able to lift her arm to performing feeding without pain.    Status On-going      PT LONG TERM GOAL #3   Title Pt will be albe lift her L UE  to fix her hair without assistance from the R UE with pain </= 2/10.    Baseline Pt able to lift her arm to reach the back of her head but still is reporting pain.    Status Partially Met      PT LONG TERM GOAL #4   Title Pt will improve her left grip strengh to >/= 60ppsi in order to help with functional mobility and ADL's.    Status On-going                 Plan - 03/23/20 1021    Clinical Impression Statement Pt arriving today reporting fatigue from not sleeping well last night. Pt reporting 2/10 pain in her left shoulder today. Focusing today on overall AROM, strengthening and rotational components. Pt still with obvious weakness in ER.  Better range noted following joint mobs. Pt compliant with her HEP. Continue skilled PT.    Personal Factors and Comorbidities Age;Comorbidity 3+    Comorbidities DM, RA, L shoulder surgery rotator cuff repair, R shoulder surgery, tyroid surgery, left knee surgery, cataracts bilateral surgery, tubes tied, cyst removal    Examination-Activity Limitations Carry;Dressing;Hygiene/Grooming;Reach Overhead;Sleep    Examination-Participation Restrictions Community Activity;Other;Laundry    Stability/Clinical Decision Making  Stable/Uncomplicated    Rehab Potential Good    PT Frequency 2x / week    PT Duration 6 weeks    PT Treatment/Interventions ADLs/Self Care Home Management;Cryotherapy;Electrical Stimulation;Iontophoresis 53m/ml Dexamethasone;Moist Heat;Traction;Ultrasound;Functional mobility training;Therapeutic activities;Therapeutic exercise;Neuromuscular re-education;Patient/family education;Manual techniques;Passive range of motion;Dry needling;Vasopneumatic Device;Taping    PT Next Visit  Plan ROM, shoulder ROM, gentle strengthening    PT Home Exercise Plan Access Code: [email or web address]    Consulted and Agree with Plan of Care Patient           Patient will benefit from skilled therapeutic intervention in order to improve the following deficits and impairments:  Pain,Increased edema,Decreased range of motion,Decreased strength,Impaired UE functional use,Postural dysfunction  Visit Diagnosis: Chronic left shoulder pain  Stiffness of left shoulder, not elsewhere classified  Localized edema     Problem List Patient Active Problem List   Diagnosis Date Noted  . CRF (chronic renal failure), stage 3 (moderate) ([place]) 02/25/2020  . Nightmares 04/22/2019  . Exposure to COVID-19 virus 10/09/2018  . Cough 10/09/2018  . Grief 11/23/2016  . Unilateral primary osteoarthritis, right knee 08/23/2016  . Hypokalemia 05/16/2016  . Rhonchi 02/09/2016  . Intertrigo 10/04/2015  . Allergic rhinitis 04/05/2015  . Well adult exam 06/28/2014  . Tinea pedis 10/13/2013  . Near syncope 09/28/2013  . LBP (low back pain) 09/24/2012  . Diabetes type 2, controlled ([place]) 11/06/2011  . Breast cancer ([place]) 11/06/2011  . URI, acute 03/02/2011  . Flank pain 03/02/2011  . Hyperglycemia 03/02/2011  . Edema 10/30/2010  . Dyspnea and respiratory abnormalities 10/30/2010  . Cramp in limb 10/30/2010  . Bronchitis 10/06/2010  . NAUSEA ALONE 03/23/2010  . RENAL CYST, LEFT 02/23/2010  . Chest pain 02/23/2010  . Dyslipidemia  07/26/2009  . ABDOMINAL PAIN-EPIGASTRIC 03/24/2009  . HIP PAIN 07/20/2008  . BACK PAIN 07/20/2008  . FEVER, NOS 07/20/2008  . PARESTHESIA 04/06/2008  . Dysuria 04/06/2008  . Dysphagia, pharyngoesophageal phase 02/05/2008  . ABDOMINAL PAIN, GENERALIZED 02/05/2008  . HYPOKALEMIA 11/25/2007  . Depression 11/25/2007  . COLONIC POLYPS, ADENOMATOUS, HX OF 10/01/2007  . 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 KELOID 07/25/2007  . MURMUR 07/25/2007  . HEMORRHOIDS, INTERNAL 05/10/2007  . CONSTIPATION, CHRONIC 05/10/2007  . BREAST CANCER, HX OF 04/16/2007  . ACUTE BRONCHITIS 02/15/2007  . Hypothyroidism 12/26/2006  . INSOMNIA, PERSISTENT 12/26/2006  . Essential hypertension 12/26/2006  . Chronic fatigue 12/26/2006  . GERD 11/19/2006  . IBS 11/19/2006  . FATTY LIVER DISEASE 11/19/2006  . Rheumatoid arthritis ([place]) 11/19/2006  . Fibromyalgia 11/19/2006    Oretha Caprice, PT, MPT 03/23/2020, 10:40 AM  [facility] [address] [place], [place], [address] Phone: [phone]   Fax:  [phone]  Name: Deanna Salazar MRN: [id number] Date of Birth: Feb 01, 1940

## 2020-03-28 ENCOUNTER — Telehealth: Payer: Self-pay | Admitting: Internal Medicine

## 2020-03-28 NOTE — Telephone Encounter (Signed)
1.Medication Requested: zolpidem (AMBIEN) 5 MG tablet    2. Pharmacy (Name, Street, [place]): [facility] [phone] - [place], [place] - [address] AT [place]  3. On Med List: yes   4. Last Visit with PCP: 2.15.22  5. Next visit date with PCP: n/a   Patient states she sometimes have to take another half tablet at night for waking up and she will run out of the medication faster. She was wondering if it could be written a different way so she does not end up running out. Please advise    Agent: Please be advised that RX refills may take up to 3 business days. We ask that you follow-up with your pharmacy.

## 2020-03-29 ENCOUNTER — Ambulatory Visit: Payer: PPO | Admitting: Physical Therapy

## 2020-03-29 ENCOUNTER — Encounter: Payer: Self-pay | Admitting: Physical Therapy

## 2020-03-29 ENCOUNTER — Other Ambulatory Visit: Payer: Self-pay

## 2020-03-29 DIAGNOSIS — R600 Localized edema: Secondary | ICD-10-CM

## 2020-03-29 DIAGNOSIS — G8929 Other chronic pain: Secondary | ICD-10-CM | POA: Diagnosis not present

## 2020-03-29 DIAGNOSIS — M25612 Stiffness of left shoulder, not elsewhere classified: Secondary | ICD-10-CM

## 2020-03-29 DIAGNOSIS — M25512 Pain in left shoulder: Secondary | ICD-10-CM

## 2020-03-29 MED ORDER — ZOLPIDEM TARTRATE 5 MG PO TABS
ORAL_TABLET | ORAL | 1 refills | Status: DC
Start: 2020-03-29 — End: 2020-09-22

## 2020-03-29 NOTE — Therapy (Signed)
[place], [place], [address] Phone: [phone]   Fax:  [phone]  Physical Therapy Treatment Discharge   Patient Details  Name: Deanna Salazar MRN: [id number] Date of Birth: [birth year] Referring Provider (PT): Lynne Leader MD   Encounter Date: 03/29/2020   PT End of Session - 03/29/20 1007    Visit Number 10    Number of Visits 12    Date for PT Re-Evaluation 03/18/20    Progress Note Due on Visit 10    PT Start Time 7322    PT Stop Time 1100    PT Time Calculation (min) 45 min    Activity Tolerance Patient tolerated treatment well    Behavior During Therapy [facility] for tasks assessed/performed           Past Medical History:  Diagnosis Date  . Abdominal pain, generalized 02/05/2008  . ABDOMINAL PAIN-EPIGASTRIC 03/24/2009  . Acute bronchitis 02/15/2007  . Adenomatous colon polyp 04/2000  . Arthritis   . ASCUS favor benign 10/2011   negative high risk HPV screen  . BACK PAIN 07/20/2008  . Breast cancer ([place]) 2009   l, hx of XRT Dr Benay Spice, Left side  . BREAST CANCER, HX OF 04/16/2007  . CHEST PAIN 02/23/2010  . COLONIC POLYPS, ADENOMATOUS, HX OF 10/01/2007  . CONSTIPATION, CHRONIC 05/10/2007  . Dysuria 04/06/2008  . FATIGUE 12/26/2006  . Fatty liver   . FATTY LIVER DISEASE 11/19/2006  . FIBROMYALGIA 11/19/2006  . GERD 11/19/2006   Dr Fuller Plan  . Glaucoma 07/2017   left eye  . HEMATOCHEZIA 05/10/2007  . HEMORRHOIDS, INTERNAL 05/10/2007  . HIP PAIN 07/20/2008  . HYPERLIPIDEMIA 07/26/2009  . HYPERTENSION 12/26/2006  . HYPOKALEMIA 11/25/2007  . HYPOTHYROIDISM 12/26/2006  . IBS 11/19/2006  . INSOMNIA, PERSISTENT 12/26/2006  . KELOID 07/25/2007  . MURMUR 07/25/2007  . Nausea alone 03/23/2010  . PARESTHESIA 04/06/2008  . Personal history of radiation therapy   . RENAL CYST, LEFT 02/23/2010  . Rheumatoid arthritis(714.0) 11/19/2006   Dr Vianne Bulls  . RUQ PAIN 11/07/2009  . Tubular adenoma   . VISION IMPAIRMENT, LOW  VISION, ONE EYE-LEFT 07/25/2007    Past Surgical History:  Procedure Laterality Date  . BREAST LUMPECTOMY Left 2009  . BREAST SURGERY  2009   Left lumpectomy  . COLONOSCOPY  06/12/2012  . Cyst excision from chest    . knee surgery Left   . Left thyroidectomy    . OS Cataract extraction  2009  . OVARIAN CYST REMOVAL  age 78  . SHOULDER SURGERY     both shoulders  . TOE SURGERY    . TUBAL LIGATION      There were no vitals filed for this visit.   Subjective Assessment - 03/29/20 1007    Subjective Pt arriving today reporting 1-2/10 in her left shoulder. Pt reprorting she is still having trouble sleeping becuase her MD is trying to get her ambien dose regulated. Currently pt is out of her medication. Pt stating that her doctor is calling her in a new prescription just waiting on insurance approval.    Pertinent History DM, RA, L shoulder surgery rotator cuff repair, R shoulder surgery, tyroid surgery, left knee surgery, cataracts bilateral surgery, tubes tied, cyst removal    Diagnostic tests X-ray    Patient Stated Goals Stop hurting, be able to move my arm without pain    Currently in Pain? Yes    Pain Score 2     Pain Location  Shoulder    Pain Orientation Left    Pain Descriptors / Indicators Sore    Pain Type Acute pain    Pain Onset More than a month ago    Pain Frequency Intermittent    Aggravating Factors  laying down at night rolling over on her left side    Pain Relieving Factors relaxing in the afternoon, pain meds as needed              [facility] PT Assessment - 03/29/20 0001      Assessment   Medical Diagnosis M25.512, L shoulder pain, M54.12 cervical radiculitis    Referring Provider (PT) Lynne Leader MD      AROM   Overall AROM Comments measured in supine    AROM Assessment Site Shoulder    Right/Left Shoulder Left    Left Shoulder Extension 68 Degrees    Left Shoulder Flexion 170 Degrees    Left Shoulder ABduction 165 Degrees    Left Shoulder Internal  Rotation 80 Degrees   shoulder abducted to 90 degrees   Left Shoulder External Rotation 75 Degrees   shoulder 45degrees abducted, 80 shoulder 90 degrees abducted     Strength   Overall Strength Comments L grip: 62 ppsi    Left Shoulder Flexion 5/5    Left Shoulder Extension 5/5    Left Shoulder ABduction 5/5    Left Shoulder Internal Rotation 5/5    Left Shoulder External Rotation 5/5                         [facility] Adult PT Treatment/Exercise - 03/29/20 0001      Shoulder Exercises: Sidelying   External Rotation AROM;Strengthening;Left;15 reps    External Rotation Weight (lbs) 2#      Shoulder Exercises: Standing   External Rotation Strengthening;AROM;Left;20 reps;Theraband    Theraband Level (Shoulder External Rotation) Level 2 (Red)    Internal Rotation Strengthening;Left;20 reps;Theraband    Theraband Level (Shoulder Internal Rotation) Level 2 (Red)    Flexion Strengthening;AROM;Both;20 reps    Shoulder Flexion Weight (lbs) 2# bar    Row Strengthening;Both;20 reps;Theraband    Theraband Level (Shoulder Row) Level 3 (Green)      Shoulder Exercises: ROM/Strengthening   UBE (Upper Arm Bike) L1.5 3 minutes foward and back      Shoulder Exercises: Stretch   Corner Stretch 3 reps;10 seconds    Table Stretch - External Rotation 5 reps;10 seconds    Other Shoulder Stretches Standing ER stretch using #2 bar holding 5 seconds each x 15 reps      Manual Therapy   Manual therapy comments PROM: grade 2-3 shoulder mobs, percussion to left shoulder                  PT Education - 03/29/20 1021    Education Details updated HEP    Person(s) Educated Patient    Methods Explanation;Demonstration;Handout    Comprehension Verbalized understanding;Returned demonstration            PT Short Term Goals - 03/02/20 0944      PT SHORT TERM GOAL #1   Title Pt will be independent in her HEP.    Status Achieved             PT Long Term Goals - 03/29/20 1101       PT LONG TERM GOAL #1   Title Pt will improve her FOTO score to >/= 59 % function.  Baseline 71      PT LONG TERM GOAL #2   Title Pt will be able to lift her arm to performing feeding without pain.    Status Achieved      PT LONG TERM GOAL #3   Title Pt will be albe lift her L UE  to fix her hair without assistance from the R UE with pain </= 2/10.    Status Achieved      PT LONG TERM GOAL #4   Title Pt will improve her left grip strengh to >/= 60ppsi in order to help with functional mobility and ADL's.    Baseline left grip:  62 ppsi    Status Achieved                 Plan - 03/29/20 1034    Clinical Impression Statement Pt arriving today reporting due to finances that she will need to make today her last visit. Pt still reporting intermittent pain of 2/10 in her left shoulder but definitely reports improvements since beginning therpay. Pt's ROM and strength were tested with excellent improvments since her last visit. Pt reporting she has been doing her HEP double to amount suggested and stretching more throughout the day. Pt's L shoulder is grossly 5/5 in strength. AROM measures as follows: flexion 170 degrees, abduction: 165degrees, extension 68 degrees, ER 75 degrees, and IR 80 degrees. Pt has made excellent progress with therapy and will be discharged today.    Personal Factors and Comorbidities Age;Comorbidity 3+    Comorbidities DM, RA, L shoulder surgery rotator cuff repair, R shoulder surgery, tyroid surgery, left knee surgery, cataracts bilateral surgery, tubes tied, cyst removal    Examination-Activity Limitations Carry;Dressing;Hygiene/Grooming;Reach Overhead;Sleep    Examination-Participation Restrictions Community Activity;Other;Laundry    Stability/Clinical Decision Making Stable/Uncomplicated    Rehab Potential Good    PT Frequency 2x / week    PT Duration 6 weeks    PT Treatment/Interventions ADLs/Self Care Home Management;Cryotherapy;Electrical  Stimulation;Iontophoresis 56m/ml Dexamethasone;Moist Heat;Traction;Ultrasound;Functional mobility training;Therapeutic activities;Therapeutic exercise;Neuromuscular re-education;Patient/family education;Manual techniques;Passive range of motion;Dry needling;Vasopneumatic Device;Taping    PT Next Visit Plan Pt discharged on 03/29/2020    PT Home Exercise Plan Access Code: [email or web address]    Consulted and Agree with Plan of Care Patient           Patient will benefit from skilled therapeutic intervention in order to improve the following deficits and impairments:  Pain,Increased edema,Decreased range of motion,Decreased strength,Impaired UE functional use,Postural dysfunction  Visit Diagnosis: Chronic left shoulder pain  Stiffness of left shoulder, not elsewhere classified  Localized edema  PHYSICAL THERAPY DISCHARGE SUMMARY  Visits from Start of Care: 10  Current functional level related to goals / functional outcomes: See above   Remaining deficits: see above    Education / Equipment: see above  Plan: Patient agrees to discharge.  Patient goals were met. Patient is being discharged due to meeting the stated rehab goals.  ?????        Problem List Patient Active Problem List   Diagnosis Date Noted  . CRF (chronic renal failure), stage 3 (moderate) ([place] 02/25/2020  . Nightmares 04/22/2019  . Exposure to COVID-19 virus 10/09/2018  . Cough 10/09/2018  . Grief 11/23/2016  . Unilateral primary osteoarthritis, right knee 08/23/2016  . Hypokalemia 05/16/2016  . Rhonchi 02/09/2016  . Intertrigo 10/04/2015  . Allergic rhinitis 04/05/2015  . Well adult exam 06/28/2014  . Tinea pedis 10/13/2013  . Near syncope 09/28/2013  . LBP (low back pain) 09/24/2012  .  Diabetes type 2, controlled ([place]) 11/06/2011  . Breast cancer ([place]) 11/06/2011  . URI, acute 03/02/2011  . Flank pain 03/02/2011  . Hyperglycemia 03/02/2011  . Edema 10/30/2010  . Dyspnea and respiratory abnormalities  10/30/2010  . Cramp in limb 10/30/2010  . Bronchitis 10/06/2010  . NAUSEA ALONE 03/23/2010  . RENAL CYST, LEFT 02/23/2010  . Chest pain 02/23/2010  . Dyslipidemia 07/26/2009  . ABDOMINAL PAIN-EPIGASTRIC 03/24/2009  . HIP PAIN 07/20/2008  . BACK PAIN 07/20/2008  . FEVER, NOS 07/20/2008  . PARESTHESIA 04/06/2008  . Dysuria 04/06/2008  . Dysphagia, pharyngoesophageal phase 02/05/2008  . ABDOMINAL PAIN, GENERALIZED 02/05/2008  . HYPOKALEMIA 11/25/2007  . Depression 11/25/2007  . COLONIC POLYPS, ADENOMATOUS, HX OF 10/01/2007  . 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 KELOID 07/25/2007  . MURMUR 07/25/2007  . HEMORRHOIDS, INTERNAL 05/10/2007  . CONSTIPATION, CHRONIC 05/10/2007  . BREAST CANCER, HX OF 04/16/2007  . ACUTE BRONCHITIS 02/15/2007  . Hypothyroidism 12/26/2006  . INSOMNIA, PERSISTENT 12/26/2006  . Essential hypertension 12/26/2006  . Chronic fatigue 12/26/2006  . GERD 11/19/2006  . IBS 11/19/2006  . FATTY LIVER DISEASE 11/19/2006  . Rheumatoid arthritis ([place]) 11/19/2006  . Fibromyalgia 11/19/2006    Oretha Caprice, PT, MPT 03/29/2020, 11:08 AM  [facility] [address] [place], [place], [address] Phone: [phone]   Fax:  [phone]  Name: Deanna Salazar MRN: [id number] Date of Birth: 01/05/1941

## 2020-03-29 NOTE — Addendum Note (Signed)
Addended by: Kearney Hard R on: 03/29/2020 12:15 PM   Modules accepted: Orders

## 2020-03-29 NOTE — Telephone Encounter (Signed)
Done. Thanks.

## 2020-04-01 ENCOUNTER — Other Ambulatory Visit: Payer: Self-pay

## 2020-04-01 ENCOUNTER — Ambulatory Visit
Admission: RE | Admit: 2020-04-01 | Discharge: 2020-04-01 | Disposition: A | Discharge status: Home / Self Care | Payer: PPO | Source: Ambulatory Visit | Attending: Internal Medicine | Admitting: Internal Medicine

## 2020-04-01 DIAGNOSIS — Z1231 Encounter for screening mammogram for malignant neoplasm of breast: Secondary | ICD-10-CM

## 2020-04-18 DIAGNOSIS — H1045 Other chronic allergic conjunctivitis: Secondary | ICD-10-CM | POA: Diagnosis not present

## 2020-04-18 DIAGNOSIS — H401122 Primary open-angle glaucoma, left eye, moderate stage: Secondary | ICD-10-CM | POA: Diagnosis not present

## 2020-04-18 DIAGNOSIS — H40011 Open angle with borderline findings, low risk, right eye: Secondary | ICD-10-CM | POA: Diagnosis not present

## 2020-05-10 DIAGNOSIS — M25561 Pain in right knee: Secondary | ICD-10-CM | POA: Diagnosis not present

## 2020-05-10 DIAGNOSIS — M1711 Unilateral primary osteoarthritis, right knee: Secondary | ICD-10-CM | POA: Diagnosis not present

## 2020-05-10 DIAGNOSIS — E663 Overweight: Secondary | ICD-10-CM | POA: Diagnosis not present

## 2020-05-10 DIAGNOSIS — R600 Localized edema: Secondary | ICD-10-CM | POA: Diagnosis not present

## 2020-05-10 DIAGNOSIS — Z6833 Body mass index (BMI) 33.0-33.9, adult: Secondary | ICD-10-CM | POA: Diagnosis not present

## 2020-05-10 DIAGNOSIS — M0609 Rheumatoid arthritis without rheumatoid factor, multiple sites: Secondary | ICD-10-CM | POA: Diagnosis not present

## 2020-06-01 ENCOUNTER — Other Ambulatory Visit: Payer: Self-pay | Admitting: Internal Medicine

## 2020-07-05 ENCOUNTER — Other Ambulatory Visit: Payer: Self-pay | Admitting: Family Medicine

## 2020-07-14 ENCOUNTER — Other Ambulatory Visit: Payer: Self-pay | Admitting: Internal Medicine

## 2020-08-16 ENCOUNTER — Other Ambulatory Visit: Payer: Self-pay | Admitting: Internal Medicine

## 2020-08-17 ENCOUNTER — Other Ambulatory Visit: Payer: Self-pay

## 2020-08-17 ENCOUNTER — Encounter: Payer: Self-pay | Admitting: Internal Medicine

## 2020-08-17 ENCOUNTER — Ambulatory Visit (INDEPENDENT_AMBULATORY_CARE_PROVIDER_SITE_OTHER): Payer: PPO | Admitting: Internal Medicine

## 2020-08-17 VITALS — BP 138/70 | Temp 98.4°F | Ht 63.0 in | Wt 187.2 lb

## 2020-08-17 DIAGNOSIS — E785 Hyperlipidemia, unspecified: Secondary | ICD-10-CM

## 2020-08-17 DIAGNOSIS — I10 Essential (primary) hypertension: Secondary | ICD-10-CM | POA: Diagnosis not present

## 2020-08-17 DIAGNOSIS — N183 Chronic kidney disease, stage 3 unspecified: Secondary | ICD-10-CM | POA: Diagnosis not present

## 2020-08-17 DIAGNOSIS — M06032 Rheumatoid arthritis without rheumatoid factor, left wrist: Secondary | ICD-10-CM | POA: Diagnosis not present

## 2020-08-17 DIAGNOSIS — E119 Type 2 diabetes mellitus without complications: Secondary | ICD-10-CM | POA: Diagnosis not present

## 2020-08-17 DIAGNOSIS — Z23 Encounter for immunization: Secondary | ICD-10-CM

## 2020-08-17 DIAGNOSIS — M06031 Rheumatoid arthritis without rheumatoid factor, right wrist: Secondary | ICD-10-CM

## 2020-08-17 LAB — CBC WITH DIFFERENTIAL/PLATELET
Basophils Absolute: 0.1 10*3/uL (ref 0.0–0.1)
Basophils Relative: 1 % (ref 0.0–3.0)
Eosinophils Absolute: 0.3 10*3/uL (ref 0.0–0.7)
Eosinophils Relative: 4.5 % (ref 0.0–5.0)
HCT: 35.4 % — ABNORMAL LOW (ref 36.0–46.0)
Hemoglobin: 11.4 g/dL — ABNORMAL LOW (ref 12.0–15.0)
Lymphocytes Relative: 25.4 % (ref 12.0–46.0)
Lymphs Abs: 1.5 10*3/uL (ref 0.7–4.0)
MCHC: 32.3 g/dL (ref 30.0–36.0)
MCV: 93.6 fl (ref 78.0–100.0)
Monocytes Absolute: 0.8 10*3/uL (ref 0.1–1.0)
Monocytes Relative: 13.5 % — ABNORMAL HIGH (ref 3.0–12.0)
Neutro Abs: 3.4 10*3/uL (ref 1.4–7.7)
Neutrophils Relative %: 55.6 % (ref 43.0–77.0)
Platelets: 208 10*3/uL (ref 150.0–400.0)
RBC: 3.78 Mil/uL — ABNORMAL LOW (ref 3.87–5.11)
RDW: 14.1 % (ref 11.5–15.5)
WBC: 6 10*3/uL (ref 4.0–10.5)

## 2020-08-17 LAB — COMPREHENSIVE METABOLIC PANEL
ALT: 8 U/L (ref 0–35)
AST: 17 U/L (ref 0–37)
Albumin: 4 g/dL (ref 3.5–5.2)
Alkaline Phosphatase: 80 U/L (ref 39–117)
BUN: 19 mg/dL (ref 6–23)
CO2: 28 mEq/L (ref 19–32)
Calcium: 10.1 mg/dL (ref 8.4–10.5)
Chloride: 103 mEq/L (ref 96–112)
Creatinine, Ser: 1.01 mg/dL (ref 0.40–1.20)
GFR: 52.4 mL/min — ABNORMAL LOW (ref >60.00)
Glucose, Bld: 91 mg/dL (ref 70–99)
Potassium: 3.8 mEq/L (ref 3.5–5.1)
Sodium: 141 mEq/L (ref 135–145)
Total Bilirubin: 0.4 mg/dL (ref 0.2–1.2)
Total Protein: 6.8 g/dL (ref 6.0–8.3)

## 2020-08-17 LAB — LIPID PANEL
Cholesterol: 187 mg/dL (ref 0–200)
HDL: 46 mg/dL (ref >39.00)
LDL Cholesterol: 114 mg/dL — ABNORMAL HIGH (ref 0–99)
NonHDL: 140.71
Total CHOL/HDL Ratio: 4
Triglycerides: 132 mg/dL (ref 0.0–149.0)
VLDL: 26.4 mg/dL (ref 0.0–40.0)

## 2020-08-17 LAB — HEMOGLOBIN A1C: Hgb A1c MFr Bld: 5.9 % (ref 4.6–6.5)

## 2020-08-17 LAB — TSH: TSH: 1.64 u[IU]/mL (ref 0.35–5.50)

## 2020-08-17 LAB — T4, FREE: Free T4: 0.75 ng/dL (ref 0.60–1.60)

## 2020-08-17 NOTE — Assessment & Plan Note (Signed)
We can reduce Metformin to 1 qd

## 2020-08-17 NOTE — Assessment & Plan Note (Addendum)
On Metformin bid. Pt wants to stop Metformin - she is worried. Discussed.  Check A1c

## 2020-08-17 NOTE — Assessment & Plan Note (Signed)
On MTX F/u [facility]

## 2020-08-17 NOTE — Assessment & Plan Note (Signed)
Chronic H/o statin intolerance On diet

## 2020-08-17 NOTE — Assessment & Plan Note (Signed)
On amlodipine, Coreg, Lasix prn

## 2020-08-17 NOTE — Progress Notes (Signed)
Subjective:  Patient ID: Deanna Salazar, female    DOB: 05/04/1940  Age: 80 y.o. MRN: [id number]  CC: Follow-up (6 month f/u)   HPI [facility] [employer] presents for hypokalemia, DM, HTN C/o KCl intolerance Pt wants to stop Metformin - she is worried F/u insomnia - on Zolpidem  Outpatient Medications Prior to Visit  Medication Sig Dispense Refill   acetaminophen (TYLENOL) 500 MG tablet Take 500-1,000 mg by mouth every 6 (six) hours as needed for mild pain, moderate pain, fever or headache.     amLODipine (NORVASC) 10 MG tablet TAKE 1 TABLET(10 MG) BY MOUTH DAILY 90 tablet 3   aspirin (ASPIRIN CHILDRENS) 81 MG chewable tablet Chew 1 tablet (81 mg total) by mouth daily. 36 tablet 11   Blood Glucose Monitoring Suppl (ONE TOUCH ULTRA 2) w/Device KIT Use to check blood sugars daily 1 kit 0   brimonidine (ALPHAGAN) 0.15 % ophthalmic solution      carvedilol (COREG) 25 MG tablet TAKE 1 TABLET(25 MG) BY MOUTH TWICE DAILY WITH A MEAL 180 tablet 1   cholecalciferol (VITAMIN D) 1000 units tablet Take 1,000 Units by mouth daily.     folic acid (FOLVITE) 1 MG tablet Take 1 mg by mouth daily.     furosemide (LASIX) 20 MG tablet TAKE 1 TO 2 TABLETS(20 TO 40 MG) BY MOUTH DAILY AS NEEDED FOR SWELLING 60 tablet 5   glucose blood (ONETOUCH ULTRA) test strip Use to check blood sugars daily 100 each 3   Lancets (ONETOUCH ULTRASOFT) lancets Use to check blood sugars daily 100 each 3   latanoprost (XALATAN) 0.005 % ophthalmic solution INT 1 GTT IN OU Q NIGHT  3   levothyroxine (SYNTHROID) 25 MCG tablet TAKE 1 TABLET BY MOUTH ONCE DAILY BEFORE BREAKFAST 90 tablet 3   meclizine (ANTIVERT) 25 MG tablet TAKE 1 TABLET BY MOUTH THREE TIMES DAILY AS NEEDED FOR DIZZINESS OR NAUSEA 30 tablet 3   metFORMIN (GLUCOPHAGE) 500 MG tablet Take 1 tablet (500 mg total) by mouth 2 (two) times daily with a meal. 180 tablet 3   methotrexate 2.5 MG tablet Take 15 mg by mouth once a week.     Multiple Vitamins-Minerals (PRESERVISION  AREDS 2+MULTI VIT PO) Take by mouth.     Polyethyl Glycol-Propyl Glycol (SYSTANE OP) Apply to eye.     Polyethylene Glycol 3350 (MIRALAX PO) Take by mouth.     pregabalin (LYRICA) 25 MG capsule TAKE 1 TO 2 CAPSULES(25 TO 50 MG) BY MOUTH TWICE DAILY AS NEEDED FOR PAIN 60 capsule 1   TRAVATAN Z 0.004 % SOLN ophthalmic solution INT 1 GTT IN OU HS  11   XIIDRA 5 % SOLN 1 drop Both Eyes 2 times a day     zolpidem (AMBIEN) 5 MG tablet Take 0.5-1 tablet at hs. Repeat 0.5-1 tab in 4 hrs if needed. 90 tablet 1   potassium chloride (MICRO-K) 10 MEQ CR capsule Take 1 capsule (10 mEq total) by mouth 2 (two) times daily. (Patient not taking: No sig reported) 30 capsule 11   RABEprazole (ACIPHEX) 20 MG tablet Take 1 tablet (20 mg total) by mouth daily. (Patient not taking: Reported on 08/17/2020) 90 tablet 3   No facility-administered medications prior to visit.    ROS: Review of Systems  Constitutional:  Negative for activity change, appetite change, chills, fatigue and unexpected weight change.  HENT:  Negative for congestion, mouth sores and sinus pressure.   Eyes:  Negative for visual disturbance.  Respiratory:  Negative for cough and chest tightness.   Gastrointestinal:  Negative for abdominal pain and nausea.  Genitourinary:  Negative for difficulty urinating, frequency and vaginal pain.  Musculoskeletal:  Positive for arthralgias. Negative for back pain and gait problem.  Skin:  Negative for pallor and rash.  Neurological:  Negative for dizziness, tremors, weakness, numbness and headaches.  Psychiatric/Behavioral:  Positive for sleep disturbance. Negative for confusion.    Objective:  BP 138/70 (BP Location: Left Arm)   Temp 98.4 F (36.9 C) (Oral)   Ht 5' 3" (1.6 m)   Wt 187 lb 3.2 oz (84.9 kg)   SpO2 97%   BMI 33.16 kg/m   BP Readings from Last 3 Encounters:  08/17/20 138/70  02/23/20 128/70  02/23/20 128/70    Wt Readings from Last 3 Encounters:  08/17/20 187 lb 3.2 oz (84.9 kg)   02/23/20 191 lb 9.6 oz (86.9 kg)  02/23/20 191 lb 9.6 oz (86.9 kg)    Physical Exam Constitutional:      General: She is not in acute distress.    Appearance: She is well-developed.  HENT:     Head: Normocephalic.     Right Ear: External ear normal.     Left Ear: External ear normal.     Nose: Nose normal.  Eyes:     General:        Right eye: No discharge.        Left eye: No discharge.     Conjunctiva/sclera: Conjunctivae normal.     Pupils: Pupils are equal, round, and reactive to light.  Neck:     Thyroid: No thyromegaly.     Vascular: No JVD.     Trachea: No tracheal deviation.  Cardiovascular:     Rate and Rhythm: Normal rate and regular rhythm.     Heart sounds: Normal heart sounds.  Pulmonary:     Effort: No respiratory distress.     Breath sounds: No stridor. No wheezing.  Abdominal:     General: Bowel sounds are normal. There is no distension.     Palpations: Abdomen is soft. There is no mass.     Tenderness: There is no abdominal tenderness. There is no guarding or rebound.  Musculoskeletal:        General: No tenderness.     Cervical back: Normal range of motion and neck supple. No rigidity.  Lymphadenopathy:     Cervical: No cervical adenopathy.  Skin:    Findings: No erythema or rash.  Neurological:     Cranial Nerves: No cranial nerve deficit.     Motor: No abnormal muscle tone.     Coordination: Coordination normal.     Deep Tendon Reflexes: Reflexes normal.  Psychiatric:        Behavior: Behavior normal.        Thought Content: Thought content normal.        Judgment: Judgment normal.    Lab Results  Component Value Date   WBC 6.3 02/23/2020   HGB 12.2 02/23/2020   HCT 37.1 02/23/2020   PLT 234.0 02/23/2020   GLUCOSE 92 02/23/2020   CHOL 202 (H) 02/23/2020   TRIG 68.0 02/23/2020   HDL 45.30 02/23/2020   LDLDIRECT 180.9 07/26/2009   LDLCALC 143 (H) 02/23/2020   ALT 9 02/23/2020   AST 15 02/23/2020   NA 140 02/23/2020   K 4.1  02/23/2020   CL 103 02/23/2020   CREATININE 1.13 02/23/2020   BUN 22 02/23/2020  CO2 31 02/23/2020   TSH 2.09 02/23/2020   HGBA1C 6.0 02/23/2020   MICROALBUR <0.7 02/23/2020    MM 3D SCREEN BREAST BILATERAL  Result Date: 04/05/2020 CLINICAL DATA:  Screening. EXAM: DIGITAL SCREENING BILATERAL MAMMOGRAM WITH TOMOSYNTHESIS AND CAD TECHNIQUE: Bilateral screening digital craniocaudal and mediolateral oblique mammograms were obtained. Bilateral screening digital breast tomosynthesis was performed. The images were evaluated with computer-aided detection. COMPARISON:  Previous exam(s). ACR Breast Density Category b: There are scattered areas of fibroglandular density. FINDINGS: There are no findings suspicious for malignancy. The images were evaluated with computer-aided detection. IMPRESSION: No mammographic evidence of malignancy. A result letter of this screening mammogram will be mailed directly to the patient. RECOMMENDATION: Screening mammogram in one year. (Code:[id number]-B-01Y) BI-RADS CATEGORY  1: Negative. Electronically Signed   By: Kristopher Oppenheim M.D.   On: 04/05/2020 10:06    Assessment & Plan:

## 2020-08-18 ENCOUNTER — Telehealth: Payer: Self-pay | Admitting: Internal Medicine

## 2020-08-18 NOTE — Telephone Encounter (Signed)
Patient viewed results of lab test via MyChart  Would like a call back bc she has some questions & there are certain things on the lab test that she doesn't understand  Callback [phone]

## 2020-08-18 NOTE — Telephone Encounter (Signed)
MD has not had a chance to review. Will call soon as labs are review by MD../lmb

## 2020-08-19 NOTE — Telephone Encounter (Signed)
I will review labs.  Thanks

## 2020-08-22 NOTE — Telephone Encounter (Signed)
MD sent results vis [email or web address]

## 2020-08-23 DIAGNOSIS — E119 Type 2 diabetes mellitus without complications: Secondary | ICD-10-CM | POA: Diagnosis not present

## 2020-08-23 DIAGNOSIS — H209 Unspecified iridocyclitis: Secondary | ICD-10-CM | POA: Diagnosis not present

## 2020-08-23 DIAGNOSIS — H0288B Meibomian gland dysfunction left eye, upper and lower eyelids: Secondary | ICD-10-CM | POA: Diagnosis not present

## 2020-08-23 DIAGNOSIS — H0288A Meibomian gland dysfunction right eye, upper and lower eyelids: Secondary | ICD-10-CM | POA: Diagnosis not present

## 2020-08-23 DIAGNOSIS — H401122 Primary open-angle glaucoma, left eye, moderate stage: Secondary | ICD-10-CM | POA: Diagnosis not present

## 2020-08-23 DIAGNOSIS — Z961 Presence of intraocular lens: Secondary | ICD-10-CM | POA: Diagnosis not present

## 2020-08-23 DIAGNOSIS — H16223 Keratoconjunctivitis sicca, not specified as Sjogren's, bilateral: Secondary | ICD-10-CM | POA: Diagnosis not present

## 2020-08-23 DIAGNOSIS — H401111 Primary open-angle glaucoma, right eye, mild stage: Secondary | ICD-10-CM | POA: Diagnosis not present

## 2020-08-23 DIAGNOSIS — H1045 Other chronic allergic conjunctivitis: Secondary | ICD-10-CM | POA: Diagnosis not present

## 2020-08-23 LAB — HM DIABETES EYE EXAM

## 2020-08-24 ENCOUNTER — Telehealth: Payer: Self-pay | Admitting: Lab

## 2020-08-24 NOTE — Progress Notes (Signed)
  Chronic Care Management   Outreach Note  08/24/2020 Name: Deanna Salazar MRN: [id number] DOB: 13-Mar-1940  Referred by: Cassandria Anger, MD Reason for referral : Medication Management   An unsuccessful telephone outreach was attempted today. The patient was referred to the pharmacist for assistance with care management and care coordination.   Follow Up Plan:   [place]

## 2020-08-30 ENCOUNTER — Telehealth: Payer: Self-pay | Admitting: Lab

## 2020-08-30 NOTE — Chronic Care Management (AMB) (Signed)
  Chronic Care Management   Note  08/30/2020 Name: [name] MRN: [id number] DOB: 01-May-1940  Deanna Salazar is a 80 y.o. year old female who is a primary care patient of Plotnikov, Evie Lacks, MD. I reached out to Deanna Salazar by phone today in response to a referral sent by [name] PCP, Plotnikov, Evie Lacks, MD.   [name] was given information about Chronic Care Management services today including:  CCM service includes personalized support from designated clinical staff supervised by her physician, including individualized plan of care and coordination with other care providers 24/7 contact phone numbers for assistance for urgent and routine care needs. Service will only be billed when office clinical staff spend 20 minutes or more in a month to coordinate care. Only one practitioner may furnish and bill the service in a calendar month. The patient may stop CCM services at any time (effective at the end of the month) by phone call to the office staff.   Patient agreed to services and verbal consent obtained.   Follow up plan:   [place]

## 2020-09-01 ENCOUNTER — Telehealth: Payer: Self-pay | Admitting: *Deleted

## 2020-09-01 ENCOUNTER — Encounter: Payer: Self-pay | Admitting: Internal Medicine

## 2020-09-01 NOTE — Telephone Encounter (Signed)
Abstracted diabetic eye test../lmb

## 2020-09-20 DIAGNOSIS — M1711 Unilateral primary osteoarthritis, right knee: Secondary | ICD-10-CM | POA: Diagnosis not present

## 2020-09-20 DIAGNOSIS — M0609 Rheumatoid arthritis without rheumatoid factor, multiple sites: Secondary | ICD-10-CM | POA: Diagnosis not present

## 2020-09-20 DIAGNOSIS — E669 Obesity, unspecified: Secondary | ICD-10-CM | POA: Diagnosis not present

## 2020-09-20 DIAGNOSIS — Z6833 Body mass index (BMI) 33.0-33.9, adult: Secondary | ICD-10-CM | POA: Diagnosis not present

## 2020-09-20 DIAGNOSIS — M25561 Pain in right knee: Secondary | ICD-10-CM | POA: Diagnosis not present

## 2020-09-21 ENCOUNTER — Other Ambulatory Visit: Payer: Self-pay | Admitting: Internal Medicine

## 2020-09-22 ENCOUNTER — Other Ambulatory Visit: Payer: Self-pay | Admitting: Internal Medicine

## 2020-10-10 ENCOUNTER — Telehealth: Payer: Self-pay

## 2020-10-10 NOTE — Chronic Care Management (AMB) (Signed)
Chronic Care Management Pharmacy Assistant   Name: Deanna Salazar  MRN: [id number] DOB: [birth year]  Deanna Salazar is an 80 y.o. year old female who presents for his initial CCM visit with the clinical pharmacist.  Recent office visits:  08/17/20-Aleksei V. Plotnikov,MD (PCP) 6 month follow up visit. Labs ordered. Follow up in 3 months. 05/10/20-Erin Richardean Canal (Family Medicine) Notes not available.  Recent consult visits:  04/18/20-Christompher Groat, (Ophthalmology) Notes not available.  Hospital visits:  None in previous 6 months  Medications: Outpatient Encounter Medications as of 10/10/2020  Medication Sig   acetaminophen (TYLENOL) 500 MG tablet Take 500-1,000 mg by mouth every 6 (six) hours as needed for mild pain, moderate pain, fever or headache.   amLODipine (NORVASC) 10 MG tablet TAKE 1 TABLET(10 MG) BY MOUTH DAILY   aspirin (ASPIRIN CHILDRENS) 81 MG chewable tablet Chew 1 tablet (81 mg total) by mouth daily.   Blood Glucose Monitoring Suppl (ONE TOUCH ULTRA 2) w/Device KIT Use to check blood sugars daily   brimonidine (ALPHAGAN) 0.15 % ophthalmic solution    carvedilol (COREG) 25 MG tablet TAKE 1 TABLET(25 MG) BY MOUTH TWICE DAILY WITH A MEAL   cholecalciferol (VITAMIN D) 1000 units tablet Take 1,000 Units by mouth daily.   folic acid (FOLVITE) 1 MG tablet Take 1 mg by mouth daily.   furosemide (LASIX) 20 MG tablet TAKE 1 TO 2 TABLETS(20 TO 40 MG) BY MOUTH DAILY AS NEEDED FOR SWELLING   glucose blood (ONETOUCH ULTRA) test strip Use to check blood sugars daily   Lancets (ONETOUCH DELICA PLUS [id number]) MISC USE TO TEST BLOOD SUGAR LEVELS   latanoprost (XALATAN) 0.005 % ophthalmic solution INT 1 GTT IN OU Q NIGHT   levothyroxine (SYNTHROID) 25 MCG tablet TAKE 1 TABLET BY MOUTH ONCE DAILY BEFORE BREAKFAST   meclizine (ANTIVERT) 25 MG tablet TAKE 1 TABLET BY MOUTH THREE TIMES DAILY AS NEEDED FOR DIZZINESS OR NAUSEA   metFORMIN (GLUCOPHAGE) 500 MG tablet Take 1 tablet (500  mg total) by mouth 2 (two) times daily with a meal.   methotrexate 2.5 MG tablet Take 15 mg by mouth once a week.   Multiple Vitamins-Minerals (PRESERVISION AREDS 2+MULTI VIT PO) Take by mouth.   Polyethyl Glycol-Propyl Glycol (SYSTANE OP) Apply to eye.   Polyethylene Glycol 3350 (MIRALAX PO) Take by mouth.   pregabalin (LYRICA) 25 MG capsule TAKE 1 TO 2 CAPSULES(25 TO 50 MG) BY MOUTH TWICE DAILY AS NEEDED FOR PAIN   TRAVATAN Z 0.004 % SOLN ophthalmic solution INT 1 GTT IN OU HS   XIIDRA 5 % SOLN 1 drop Both Eyes 2 times a day   zolpidem (AMBIEN) 5 MG tablet TAKE 1/2 TO 1 TABLET BY MOUTH AT BEDTIME. REPEAT 0.5-1 TABLET IN 4 HOURS AS NEEDED   No facility-administered encounter medications on file as of 10/10/2020.   Acetaminophen (TYLENOL) 500 MG tablet Last filled:None noted/ AmLODipine (NORVASC) 10 MG tablet Last filled:09/03/20 90 DS Aspirin (ASPIRIN CHILDRENS) 81 MG chewable tablet Last filled:None noted  Brimonidine (ALPHAGAN) 0.15 % ophthalmic solution Last filled:10/08/20 75 DS Carvedilol (COREG) 25 MG tablet Last filled:08/16/20 90 DS Cholecalciferol (VITAMIN D) 1000 units tablet Last filled:None noted Folic acid (FOLVITE) 1 MG tablet Last filled:08/17/20 90 DS Furosemide (LASIX) 20 MG tablet Last filled:07/14/20 90 DS Latanoprost (XALATAN) 0.005 % ophthalmic solution Last filled:07/22/20 75 DS Levothyroxine (SYNTHROID) 25 MCG tablet Last filled:07/12/20 90 DS Meclizine (ANTIVERT) 25 MG tablet Last filled:09/09/19 10 DS MetFORMIN (GLUCOPHAGE) 500 MG tablet Last  filled:06/22/20 90 DS Methotrexate 2.5 MG tablet Last filled:09/21/20 28 DS Polyethylene Glycol 3350 (MIRALAX PO) Last filled:None noted  Pregabalin (LYRICA) 25 MG capsule Last filled:08/27/20 15 DS TRAVATAN Z 0.004 % SOLN ophthalmic solution Last filled:None noted XIIDRA 5 % SOLN Last filled:03/12/19 2 DS Zolpidem (AMBIEN) 5 MG tablet Last filled:09/26/20 30 DS   Care Gaps: Zoster Vaccines- Shingrix:Never done COVID-19  Vaccine:Last completed: Oct 20, 2019 INFLUENZA VACCINE:Last completed: Nov 05, 2019  Star Rating Drugs: MetFORMIN (GLUCOPHAGE) 500 MG tablet Last filled:06/22/20 90 DS  Corrie Mckusick, [place]

## 2020-10-11 ENCOUNTER — Ambulatory Visit (INDEPENDENT_AMBULATORY_CARE_PROVIDER_SITE_OTHER): Payer: PPO

## 2020-10-11 ENCOUNTER — Other Ambulatory Visit: Payer: Self-pay

## 2020-10-11 DIAGNOSIS — Z23 Encounter for immunization: Secondary | ICD-10-CM | POA: Diagnosis not present

## 2020-10-11 DIAGNOSIS — M06032 Rheumatoid arthritis without rheumatoid factor, left wrist: Secondary | ICD-10-CM

## 2020-10-11 DIAGNOSIS — M06031 Rheumatoid arthritis without rheumatoid factor, right wrist: Secondary | ICD-10-CM

## 2020-10-11 DIAGNOSIS — I10 Essential (primary) hypertension: Secondary | ICD-10-CM

## 2020-10-11 DIAGNOSIS — E785 Hyperlipidemia, unspecified: Secondary | ICD-10-CM

## 2020-10-11 DIAGNOSIS — E119 Type 2 diabetes mellitus without complications: Secondary | ICD-10-CM

## 2020-10-11 DIAGNOSIS — E039 Hypothyroidism, unspecified: Secondary | ICD-10-CM

## 2020-10-11 DIAGNOSIS — N183 Chronic kidney disease, stage 3 unspecified: Secondary | ICD-10-CM

## 2020-10-11 NOTE — Progress Notes (Addendum)
Chronic Care Management Pharmacy Note  10/11/2020 Name:  Deanna Salazar MRN:  [id number] DOB:  1940/04/12  Summary: -Patient reports that blood pressures have been well controlled, averaging 120's/70's - reports to one occurrence of hypotension where her blood pressure was ~98/66 - but subsided after a few moments - no issues since  -Notes that blood sugars have been well controlled averaging 100-110 - lowest blood sugar was 88, highest sugar was 148 -Patient reports that pain has been controlled for the most part, occasionally will have acute left shoulder pain which she takes APAP for and is helpful, tries taking lyrica when pain occurs but does not find to be effective -Continues taking zolpidem at bedtime for her insomnia, having issues staying asleep - can wake up ~6 times nightly - having nightmares which she notices can raise her blood pressure in the AM  Recommendations/Changes made from today's visit: -Recommending for patient to trial lidocaine 4% patches - 1 patch daily on shoulder or back - 12 hours on / 12 hours off - also advised that patient could trial voltaren gel 1% 2g up to 4 times daily applied to shoulder / back if not using lidocaine patch -Patient to continue to monitor blood pressure and blood sugar 3 times weekly, to reach out should BG >140/90 or BG >150 / if she is having any issues with hypotension / hypoglycemia  -Recommending referral to sleep study for evaluation of her insomnia, has trialed numerous medications in the past without success - has never seen sleep medicine  Subjective: Deanna Salazar is an 80 y.o. year old female who is a primary patient of Plotnikov, Evie Lacks, MD.  The CCM team was consulted for assistance with disease management and care coordination needs.    Engaged with patient face to face for initial visit in response to provider referral for pharmacy case management and/or care coordination services.   Consent to Services:  The patient  was given the following information about Chronic Care Management services today, agreed to services, and gave verbal consent: 1. CCM service includes personalized support from designated clinical staff supervised by the primary care provider, including individualized plan of care and coordination with other care providers 2. 24/7 contact phone numbers for assistance for urgent and routine care needs. 3. Service will only be billed when office clinical staff spend 20 minutes or more in a month to coordinate care. 4. Only one practitioner may furnish and bill the service in a calendar month. 5.The patient may stop CCM services at any time (effective at the end of the month) by phone call to the office staff. 6. The patient will be responsible for cost sharing (co-pay) of up to 20% of the service fee (after annual deductible is met). Patient agreed to services and consent obtained.  Patient Care Team: Plotnikov, Evie Lacks, MD as PCP - General Hennie Duos, MD (Rheumatology) Newt Minion, MD (Orthopedic Surgery) Rosita Kea, PA-C (Inactive) (Rheumatology) Delice Bison Darnelle Maffucci, [facility] as Pharmacist (Pharmacist)  Recent office visits:  08/17/20-Aleksei V. Plotnikov,MD (PCP) 6 month follow up visit. Achipex and potassium discontinued  05/10/20-Erin Richardean Canal Notes not available.   Recent consult visits:  05/10/20-Erin Richardean Canal Notes (Rheumatology) not available 04/18/20-Christompher Katy Fitch, (Ophthalmology) Notes not available.   Hospital visits:  None in previous 6 months  Objective:  Lab Results  Component Value Date   CREATININE 1.01 08/17/2020   BUN 19 08/17/2020   GFR 52.40 (L) 08/17/2020   GFRNONAA 64 (  L) 09/28/2013   GFRAA 74 (L) 09/28/2013   NA 141 08/17/2020   K 3.8 08/17/2020   CALCIUM 10.1 08/17/2020   CO2 28 08/17/2020   GLUCOSE 91 08/17/2020    Lab Results  Component Value Date/Time   HGBA1C 5.9 08/17/2020 11:20 AM   HGBA1C 6.0 02/23/2020 09:58 AM   GFR 52.40 (L) 08/17/2020  11:20 AM   GFR 45.95 (L) 02/23/2020 09:58 AM   MICROALBUR <0.7 02/23/2020 09:58 AM   MICROALBUR 1.8 09/08/2018 10:14 AM    Last diabetic Eye exam:  Lab Results  Component Value Date/Time   HMDIABEYEEXA No Retinopathy 08/23/2020 12:00 AM    Last diabetic Foot exam:  No results found for: HMDIABFOOTEX   Lab Results  Component Value Date   CHOL 187 08/17/2020   HDL 46.00 08/17/2020   LDLCALC 114 (H) 08/17/2020   LDLDIRECT 180.9 07/26/2009   TRIG 132.0 08/17/2020   CHOLHDL 4 08/17/2020    Hepatic Function Latest Ref Rng & Units 08/17/2020 02/23/2020 04/22/2019  Total Protein 6.0 - 8.3 g/dL 6.8 7.1 7.1  Albumin 3.5 - 5.2 g/dL 4.0 4.1 4.1  AST 0 - 37 U/L 17 15 18   ALT 0 - 35 U/L 8 9 24   Alk Phosphatase 39 - 117 U/L 80 82 77  Total Bilirubin 0.2 - 1.2 mg/dL 0.4 0.3 0.4  Bilirubin, Direct 0.0 - 0.3 mg/dL - - 0.1    Lab Results  Component Value Date/Time   TSH 1.64 08/17/2020 11:20 AM   TSH 2.09 02/23/2020 09:58 AM   FREET4 0.75 08/17/2020 11:20 AM   FREET4 0.67 04/22/2019 04:04 PM    CBC Latest Ref Rng & Units 08/17/2020 02/23/2020 04/22/2019  WBC 4.0 - 10.5 K/uL 6.0 6.3 8.2  Hemoglobin 12.0 - 15.0 g/dL 11.4(L) 12.2 12.5  Hematocrit 36.0 - 46.0 % 35.4(L) 37.1 37.8  Platelets 150.0 - 400.0 K/uL 208.0 234.0 203.0    Lab Results  Component Value Date/Time   VD25OH 60.91 04/22/2019 04:04 PM   VD25OH 52 06/24/2012 08:43 AM   VD25OH 57 07/26/2009 10:42 PM    Clinical ASCVD: No  The ASCVD Risk score (Arnett DK, et al., 2019) failed to calculate for the following reasons:   The 2019 ASCVD risk score is only valid for ages 45 to 6    Depression screen PHQ 2/9 08/17/2020 02/23/2020 12/22/2019  Decreased Interest 0 0 0  Down, Depressed, Hopeless 0 0 0  PHQ - 2 Score 0 0 0  Altered sleeping 0 - -  Tired, decreased energy 0 - -  Change in appetite 0 - -  Feeling bad or failure about yourself  0 - -  Trouble concentrating 0 - -  Moving slowly or fidgety/restless 0 - -   Suicidal thoughts 0 - -  PHQ-9 Score 0 - -  Difficult doing work/chores - - -  Some recent data might be hidden    Social History   Tobacco Use  Smoking Status Never  Smokeless Tobacco Never   BP Readings from Last 3 Encounters:  08/17/20 138/70  02/23/20 128/70  02/23/20 128/70   Pulse Readings from Last 3 Encounters:  02/23/20 64  02/23/20 64  12/30/19 60   Wt Readings from Last 3 Encounters:  08/17/20 187 lb 3.2 oz (84.9 kg)  02/23/20 191 lb 9.6 oz (86.9 kg)  02/23/20 191 lb 9.6 oz (86.9 kg)   BMI Readings from Last 3 Encounters:  08/17/20 33.16 kg/m  02/23/20 33.94 kg/m  02/23/20 33.94 kg/m  Assessment/Interventions: Review of patient past medical history, allergies, medications, health status, including review of consultants reports, laboratory and other test data, was performed as part of comprehensive evaluation and provision of chronic care management services.   SDOH:  (Social Determinants of Health) assessments and interventions performed: Yes  SDOH Screenings   Alcohol Screen: Low Risk    Last Alcohol Screening Score (AUDIT): 0  Depression (PHQ2-9): Low Risk    PHQ-2 Score: 0  Financial Resource Strain: Low Risk    Difficulty of Paying Living Expenses: Not hard at all  Food Insecurity: No Food Insecurity   Worried About Charity fundraiser in the Last Year: Never true   Ran Out of Food in the Last Year: Never true  Housing: Low Risk    Last Housing Risk Score: 0  Physical Activity: Insufficiently Active   Days of Exercise per Week: 3 days   Minutes of Exercise per Session: 40 min  Social Connections: Socially Isolated   Frequency of Communication with Friends and Family: More than three times a week   Frequency of Social Gatherings with Friends and Family: Never   Attends Religious Services: Never   Marine scientist or Organizations: No   Attends Archivist Meetings: Never   Marital Status: Widowed  Stress: No Stress Concern  Present   Feeling of Stress : Only a little  Tobacco Use: Low Risk    Smoking Tobacco Use: Never   Smokeless Tobacco Use: Never  Transportation Needs: No Transportation Needs   Lack of Transportation (Medical): No   Lack of Transportation (Non-Medical): No    CCM Care Plan  Allergies  Allergen Reactions   Codeine Nausea And Vomiting and Other (See Comments)    Pt states that she is able to take on a short term basis.     Hydrocodone Nausea And Vomiting and Other (See Comments)    Pt states that she is able to take on a short term basis.     Hydrocodone-Acetaminophen     Other reaction(s): Unknown   Lovastatin Itching and Other (See Comments)    Reaction:  Leg cramps    Pantoprazole     ?abd pain   Pepcid [Famotidine]     ?abd pain   Telmisartan     The patient thinks that telmisartan is making her sneeze   Tramadol Hcl Itching   Trazodone And Nefazodone     Nightmares    Medications Reviewed Today     Reviewed by Tomasa Blase, [facility] (Pharmacist) on 10/11/20 at 1130  Med List Status: <None>   Medication Order Taking? Sig Documenting Provider Last Dose Status Informant  acetaminophen (TYLENOL) 500 MG tablet [id number] Yes Take 500-1,000 mg by mouth every 6 (six) hours as needed for mild pain, moderate pain, fever or headache. [provider] Taking Active Self  amLODipine (NORVASC) 10 MG tablet [id number] Yes TAKE 1 TABLET(10 MG) BY MOUTH DAILY Plotnikov, Evie Lacks, MD Taking Active   aspirin (ASPIRIN CHILDRENS) 81 MG chewable tablet [id number] Yes Chew 1 tablet (81 mg total) by mouth daily. Plotnikov, Evie Lacks, MD Taking Active Self  Blood Glucose Monitoring Suppl (ONE TOUCH ULTRA 2) w/Device KIT [id number] Yes Use to check blood sugars daily Plotnikov, Evie Lacks, MD Taking Active   brimonidine (ALPHAGAN) 0.2 % ophthalmic solution [id number] Yes Place 1 drop into both eyes in the morning and at bedtime. [provider] Taking Active   carvedilol (COREG) 25 MG  tablet [id number] Yes TAKE  1 TABLET(25 MG) BY MOUTH TWICE DAILY WITH A MEAL Plotnikov, Evie Lacks, MD Taking Active   cholecalciferol (VITAMIN D) 1000 units tablet [id number] Yes Take 1,000 Units by mouth daily. [provider] Taking Active Self  diclofenac Sodium (VOLTAREN) 1 % GEL [id number] Yes Apply 2 g topically 4 (four) times daily. [provider]  Active   folic acid (FOLVITE) 1 MG tablet [id number] Yes Take 1 mg by mouth daily. [provider] Taking Active   furosemide (LASIX) 20 MG tablet [id number] Yes TAKE 1 TO 2 TABLETS(20 TO 40 MG) BY MOUTH DAILY AS NEEDED FOR SWELLING  Patient taking differently: Take 20 mg by mouth daily as needed for edema or fluid. Takes at least twice weekly   Plotnikov, Evie Lacks, MD Taking Active   glucose blood (ONETOUCH ULTRA) test strip [id number] Yes Use to check blood sugars daily Plotnikov, Evie Lacks, MD Taking Active   ketotifen (ZADITOR) 0.025 % ophthalmic solution [id number] Yes Place 1 drop into both eyes 2 (two) times daily. [provider] Taking Active   Lancets Glory Rosebush DELICA PLUS [id number]) [place] [id number] Yes USE TO TEST BLOOD SUGAR LEVELS Plotnikov, Evie Lacks, MD Taking Active   latanoprost (XALATAN) 0.005 % ophthalmic solution [id number] Yes Place 1 drop into both eyes at bedtime. [provider] Taking Active   levothyroxine (SYNTHROID) 25 MCG tablet [id number] Yes TAKE 1 TABLET BY MOUTH ONCE DAILY BEFORE BREAKFAST Plotnikov, Evie Lacks, MD Taking Active   Lidocaine (ASPERCREME LIDOCAINE) 4 % PTCH [id number] Yes Apply 1 patch topically daily. 12 hours on and 12 hours off [provider]  Active   meclizine (ANTIVERT) 25 MG tablet [id number] Yes TAKE 1 TABLET BY MOUTH THREE TIMES DAILY AS NEEDED FOR DIZZINESS OR NAUSEA Plotnikov, Evie Lacks, MD Taking Active   metFORMIN (GLUCOPHAGE) 500 MG tablet [id number] Yes Take 1 tablet (500 mg total) by mouth 2 (two) times daily with a meal. Plotnikov, Evie Lacks, MD  Taking Active   methotrexate 2.5 MG tablet [id number] Yes Take 20 mg by mouth once a week. [provider] Taking Active   Multiple Vitamins-Minerals (PRESERVISION AREDS 2+MULTI VIT PO) [id number] Yes Take 1 capsule by mouth in the morning and at bedtime. [provider] Taking Active   Polyethyl Glycol-Propyl Glycol (SYSTANE OP) [id number] Yes Apply 1 drop to eye as needed. [provider] Taking Active   Polyethylene Glycol 3350 (MIRALAX PO) [id number] Yes Take by mouth. [provider] Taking Active   pregabalin (LYRICA) 25 MG capsule [id number] Yes TAKE 1 TO 2 CAPSULES(25 TO 50 MG) BY MOUTH TWICE DAILY AS NEEDED FOR PAIN Gregor Hams, MD Taking Active   zolpidem (AMBIEN) 5 MG tablet [id number] Yes TAKE 1/2 TO 1 TABLET BY MOUTH AT BEDTIME. REPEAT 0.5-1 TABLET IN 4 HOURS AS NEEDED Plotnikov, Evie Lacks, MD Taking Active             Patient Active Problem List   Diagnosis Date Noted   CRF (chronic renal failure), stage 3 (moderate) ([place]) 02/25/2020   Nightmares 04/22/2019   Exposure to COVID-19 virus 10/09/2018   Cough 10/09/2018   Grief 11/23/2016   Unilateral primary osteoarthritis, right knee 08/23/2016   Hypokalemia 05/16/2016   Rhonchi 02/09/2016   Intertrigo 10/04/2015   Allergic rhinitis 04/05/2015   Well adult exam 06/28/2014   Tinea pedis 10/13/2013   Near syncope 09/28/2013   LBP (low back pain) 09/24/2012   Diabetes type 2, controlled ([place]) 11/06/2011   Breast cancer ([place]) 11/06/2011  URI, acute 03/02/2011   Flank pain 03/02/2011   Hyperglycemia 03/02/2011   Edema 10/30/2010   Dyspnea and respiratory abnormalities 10/30/2010   Cramp in limb 10/30/2010   Bronchitis 10/06/2010   NAUSEA ALONE 03/23/2010   RENAL CYST, LEFT 02/23/2010   Chest pain 02/23/2010   Dyslipidemia 07/26/2009   ABDOMINAL PAIN-EPIGASTRIC 03/24/2009   HIP PAIN 07/20/2008   BACK PAIN 07/20/2008   FEVER, NOS 07/20/2008   PARESTHESIA 04/06/2008   Dysuria  04/06/2008   Dysphagia, pharyngoesophageal phase 02/05/2008   ABDOMINAL PAIN, GENERALIZED 02/05/2008   HYPOKALEMIA 11/25/2007   Depression 11/25/2007   COLONIC POLYPS, ADENOMATOUS, HX OF 10/01/2007   VISION IMPAIRMENT, LOW VISION, ONE EYE-LEFT 07/25/2007   KELOID 07/25/2007   MURMUR 07/25/2007   HEMORRHOIDS, INTERNAL 05/10/2007   CONSTIPATION, CHRONIC 05/10/2007   BREAST CANCER, HX OF 04/16/2007   ACUTE BRONCHITIS 02/15/2007   Hypothyroidism 12/26/2006   INSOMNIA, PERSISTENT 12/26/2006   Essential hypertension 12/26/2006   Chronic fatigue 12/26/2006   GERD 11/19/2006   IBS 11/19/2006   FATTY LIVER DISEASE 11/19/2006   Rheumatoid arthritis ([place]) 11/19/2006   Fibromyalgia 11/19/2006    Immunization History  Administered Date(s) Administered   Fluad Quad(high Dose 65+) 09/08/2018, 11/05/2019, 10/11/2020   Influenza Split 10/30/2010, 01/16/2012   Influenza Whole 03/03/2009   Influenza, High Dose Seasonal PF 12/06/2014, 10/04/2015, 11/23/2016, 10/11/2017   Influenza,inj,Quad PF,6+ Mos 09/24/2012, 01/04/2014   PFIZER(Purple Top)SARS-COV-2 Vaccination 03/19/2019, 04/13/2019, 10/20/2019   PNEUMOCOCCAL CONJUGATE-20 08/17/2020   Pneumococcal Conjugate-13 04/05/2014   Pneumococcal Polysaccharide-23 12/06/2014   Td 04/05/2014    Conditions to be addressed/monitored:  Hypertension, Hyperlipidemia, Diabetes, Chronic Kidney Disease, Hypothyroidism, Rheumatoid Arthritis and Insomnia  Care Plan : CCM Care Plan  Updates made by Tomasa Blase, RPH since 10/11/2020 12:00 AM     Problem: Hypertension, Hyperlipidemia, Diabetes, Chronic Kidney Disease, Hypothyroidism, Rheumatoid Arthritis and Insomnia   Priority: High  Onset Date: 10/11/2020     Long-Range Goal: Disease Management   Start Date: 10/11/2020  Expected End Date: 04/11/2021  This Visit's Progress: On track  Priority: High  Note:    Current Barriers:  Unable to independently monitor therapeutic efficacy  Pharmacist  Clinical Goal(s):  Patient will achieve adherence to monitoring guidelines and medication adherence to achieve therapeutic efficacy maintain control of blood pressure and blood sugar as evidenced by BP and BG logs  through collaboration with PharmD and provider.   Interventions: 1:1 collaboration with Plotnikov, Evie Lacks, MD regarding development and update of comprehensive plan of care as evidenced by provider attestation and co-signature Inter-disciplinary care team collaboration (see longitudinal plan of care) Comprehensive medication review performed; medication list updated in electronic medical record  Hypertension (BP goal <140/90) -Controlled -Current treatment: Carvedilol 65m - 1 tablet twice daily  Amlodipine 183m- 1 tablet daily  Furosemide 2090m 1-2 tablets daily as needed - taking 1 tablet daily as needed - usually twice weekly  -Medications previously tried: losartan, telmisartan, valsartan,   -Current home readings: 123/69, 120's/70's  -Current dietary habits: reports that she eats a low sodium, usually drinking 2-3 cups of coffee weekly -Current exercise habits: 3 times weekly - exercise bike 30 minutes -Denies hypotensive/hypertensive symptoms -Educated on BP goals and benefits of medications for prevention of heart attack, stroke and kidney damage; Daily salt intake goal < 2300 mg; Exercise goal of 150 minutes per week; Importance of home blood pressure monitoring; Proper BP monitoring technique; Symptoms of hypotension and importance of maintaining adequate hydration; -Counseled to monitor BP at  home 3 times weekly, document, and provide log at future appointments -Counseled on diet and exercise extensively Recommended to continue current medication  Hyperlipidemia: (LDL goal < 70) -Uncontrolled Lab Results  Component Value Date   LDLCALC 114 (H) 08/17/2020  -Current treatment: N/a - patient noted statin intolerance -Medications previously tried: lovastatin   -Current dietary patterns: reports that her diet can be elevated in fried foods at times -Current exercise habits: using exercise bike 3 times weekly for 30 minutes at a time  -Educated on Cholesterol goals;  Benefits of statin for ASCVD risk reduction; Importance of limiting foods high in cholesterol; Exercise goal of 150 minutes per week; -Counseled on diet and exercise extensively Recommended trial of non-statin medication (zetia, PCSK9, nexlizet) - patient was not interested in starting a non-statin medication for her cholesterol, preferred to trial dietary changes first   Diabetes (A1c goal <7%) -Controlled Lab Results  Component Value Date   HGBA1C 5.9 08/17/2020  -Current medications: Metformin 572m - 1 tablet twice daily  -Medications previously tried: n/a  -Current home glucose readings fasting glucose: 102-112 - highest was 148, lowest was 88 -Denies hypoglycemic/hyperglycemic symptoms -Current meal patterns:  breakfast: almond milk with cereal, oatmeal w/ raisin + cinnamon  lunch: sandwich, ham/chicken/turkey cheese, salad on occasion   dinner: protein, rice, vegetables snacks: fruit drinks: water, tea with a small amount of sugar -Current exercise: using exercise bike 3 times weekly for 30 minutes at a time  -Educated on A1c and blood sugar goals; Complications of diabetes including kidney damage, retinal damage, and cardiovascular disease; Exercise goal of 150 minutes per week; Prevention and management of hypoglycemic episodes; Benefits of routine self-monitoring of blood sugar; Carbohydrate counting and/or plate method -Counseled to check feet daily and get yearly eye exams -Counseled on diet and exercise extensively Recommended to continue current medication  Hypothyroidism (Goal: Maintenance of euthyroid levels) -Controlled Lab Results  Component Value Date   TSH 1.64 08/17/2020  -Current treatment  Levothyroxine 284m - 1 tablet daily  -Medications  previously tried: n/a  -Counseled on diet and exercise extensively Recommended to continue current medication  Rheumatoid Arthritis/ Fibromyalgia (Goal: Prevention of disease progression / pain control) -Not ideally controlled -Current treatment  Methotrexate 2.31m77m 41m28NOce a week Folic acid 1mg331m1 tablet daily Acetaminophen 500mg62m-2 tablets every 6 hours as needed  Lyrica 231mg 41m2 capsules twice daily as needed  - taking only when shoulder pain flares  -Medications previously tried: hydrocodone, naproxen, tramadol, percocet, cyclobenzaprine, methocarbamol,  -Recommended for patient to trial lidocaine 4% patches - 1 patch daily to affected area - 12 hours on / 12 hours off / trial of voltaren 1% gel 2g to affected area 4 times daily, patient is not using lyrica currently, only taking as needed - does not find it is very effective, likely as she is taking prn levels are unable to become therapeutic, patient is going to discus with pain management provider with next visit  Glaucoma (Goal: Control of Occular pressure) - Follows with Dr. Groat Katy Fitchrolled -Current treatment  Brimonidine 0.2% ophthalmic solution - 1 drop into both eyes twice daily Latanoprost 0.005% - 1 drop into both eyes at bedtime  Systane eye drops - 1 drop into both eyes as needed  Ketotifen 0.025% ophthalmic solution - 1 drop into both eyes twice daily  -Medications previously tried: n/a  -Recommended to continue current medication  Insomnia (Goal: Promotion of quality sleep) -Not ideally controlled -Current treatment  Zolpidem 31mg -69m  1/2-1 tablet at bedtime - repeat 1/2-1 tablet 4 hours later if needed -reports to having issues staying asleep throughout the night - can wake up ~6 times a night  -Medications previously tried: temazepam, trazodone,   -Recommended referral to sleep medicine for further evaluation  Chronic Kidney Disease (Goal: Prevention of disease progression) -Controlled -Last eGFR - 52.40  mL/min -Last Scr - 1.01 mg/dL -Current treatment  Avoidance of nephrotoxic agents / adequate blood pressure and blood sugar control to prevent kidney damage -Medications previously tried: n/a  -Recommended to continue current medication  Health Maintenance -Vaccine gaps: advised for patient to receiving shingles vaccine / COVID booster- flu shot completed today -Current therapy:  Meclizine 81m - 1 tablet 3 times daily as needed  Miralax - 17g daily as needed  Vitamin D3 1000 units - 1 tablet daily  Preservision AERDS 2 - 1 capsule twice daily  -Educated on Cost vs benefit of each product must be carefully weighed by individual consumer -Patient is satisfied with current therapy and denies issues -Recommended to continue current medication  Patient Goals/Self-Care Activities Patient will:  - take medications as prescribed check glucose 3 times weekly, document, and provide at future appointments check blood pressure 3 times weekly, document, and provide at future appointments target a minimum of 150 minutes of moderate intensity exercise weekly engage in dietary modifications by reducing high cholesterol food intake / moderation of carbs   Follow Up Plan: Telephone follow up appointment with care management team member scheduled for: 3 months  The patient has been provided with contact information for the care management team and has been advised to call with any health related questions or concerns.        Medication Assistance: None required.  Patient affirms current coverage meets needs.  Patient's preferred pharmacy is:  [id number] [id number]: [phone]: [phone]  Uses pill box? No - feels she is able to manage without use of pill box at this time Pt endorses 90-100% compliance  Care Plan and Follow Up Patient Decision:   Patient agrees to Care Plan and Follow-up.  Plan: Telephone follow up appointment with care management team member scheduled for:  3 months and The patient has been provided with contact information for the care management team and has been advised to call with any health related questions or concerns.   DTomasa Blase PharmD Clinical Pharmacist, LPiquascreening examination/treatment/procedure(s) were performed by non-physician practitioner and as supervising physician I was immediately available for consultation/collaboration.  I agree with above. ALew Dawes MD

## 2020-10-11 NOTE — Patient Instructions (Signed)
Deanna Salazar,  It was great to talk to you today!  Please call me with any questions or concerns.  Visit Information   PATIENT GOALS:   Goals Addressed             This Visit's Progress    Monitor and Manage My Blood Sugar-Diabetes Type 2       Timeframe:  Long-Range Goal Priority:  High Start Date:  10/11/2020                          Expected End Date:  04/11/2021                      Follow Up Date 01/11/2021   - check blood sugar at prescribed times - check blood sugar if I feel it is too high or too low - enter blood sugar readings and medication or insulin into daily log - take the blood sugar log to all doctor visits - take the blood sugar meter to all doctor visits    Why is this important?   Checking your blood sugar at home helps to keep it from getting very high or very low.  Writing the results in a diary or log helps the doctor know how to care for you.  Your blood sugar log should have the time, date and the results.  Also, write down the amount of insulin or other medicine that you take.  Other information, like what you ate, exercise done and how you were feeling, will also be helpful.       Track and Manage My Blood Pressure-Hypertension       Timeframe:  Long-Range Goal   - check blood pressure 3 times per week - choose a place to take my blood pressure (home, clinic or office, retail store) - write blood pressure results in a log or diary    Why is this important?   You won't feel high blood pressure, but it can still hurt your blood vessels.  High blood pressure can cause heart or kidney problems. It can also cause a stroke.  Making lifestyle changes like losing a little weight or eating less salt will help.  Checking your blood pressure at home and at different times of the day can help to control blood pressure.  If the doctor prescribes medicine remember to take it the way the doctor ordered.  Call the office if you cannot afford the medicine or  if there are questions about it.          Consent to CCM Services: Ms. Bolon was given information about Chronic Care Management services including:  CCM service includes personalized support from designated clinical staff supervised by her physician, including individualized plan of care and coordination with other care providers 24/7 contact phone numbers for assistance for urgent and routine care needs. Service will only be billed when office clinical staff spend 20 minutes or more in a month to coordinate care. Only one practitioner may furnish and bill the service in a calendar month. The patient may stop CCM services at any time (effective at the end of the month) by phone call to the office staff. The patient will be responsible for cost sharing (co-pay) of up to 20% of the service fee (after annual deductible is met).  Patient agreed to services and verbal consent obtained.   Patient verbalizes understanding of instructions provided today and agrees to view in   MyChart.   Telephone follow up appointment with care management team member scheduled for: 3 months The patient has been provided with contact information for the care management team and has been advised to call with any health related questions or concerns.   Tomasa Blase, PharmD Clinical Pharmacist, [facility]   CLINICAL CARE PLAN: Patient Care Plan: CCM Care Plan     Problem Identified: Hypertension, Hyperlipidemia, Diabetes, Chronic Kidney Disease, Hypothyroidism, Rheumatoid Arthritis and Insomnia   Priority: High  Onset Date: 10/11/2020     Long-Range Goal: Disease Management   Start Date: 10/11/2020  Expected End Date: 04/11/2021  This Visit's Progress: On track  Priority: High  Note:    Current Barriers:  Unable to independently monitor therapeutic efficacy  Pharmacist Clinical Goal(s):  Patient will achieve adherence to monitoring guidelines and medication adherence to achieve therapeutic  efficacy maintain control of blood pressure and blood sugar as evidenced by BP and BG logs  through collaboration with PharmD and provider.   Interventions: 1:1 collaboration with Plotnikov, Evie Lacks, MD regarding development and update of comprehensive plan of care as evidenced by provider attestation and co-signature Inter-disciplinary care team collaboration (see longitudinal plan of care) Comprehensive medication review performed; medication list updated in electronic medical record  Hypertension (BP goal <140/90) -Controlled -Current treatment: Carvedilol 20m - 1 tablet twice daily  Amlodipine 172m- 1 tablet daily  Furosemide 2053m 1-2 tablets daily as needed - taking 1 tablet daily as needed - usually twice weekly  -Medications previously tried: losartan, telmisartan, valsartan,   -Current home readings: 123/69, 120's/70's  -Current dietary habits: reports that she eats a low sodium, usually drinking 2-3 cups of coffee weekly -Current exercise habits: 3 times weekly - exercise bike 30 minutes -Denies hypotensive/hypertensive symptoms -Educated on BP goals and benefits of medications for prevention of heart attack, stroke and kidney damage; Daily salt intake goal < 2300 mg; Exercise goal of 150 minutes per week; Importance of home blood pressure monitoring; Proper BP monitoring technique; Symptoms of hypotension and importance of maintaining adequate hydration; -Counseled to monitor BP at home 3 times weekly, document, and provide log at future appointments -Counseled on diet and exercise extensively Recommended to continue current medication  Hyperlipidemia: (LDL goal < 70) -Uncontrolled Lab Results  Component Value Date   LDLCALC 114 (H) 08/17/2020  -Current treatment: N/a - patient noted statin intolerance -Medications previously tried: lovastatin  -Current dietary patterns: reports that her diet can be elevated in fried foods at times -Current exercise habits: using  exercise bike 3 times weekly for 30 minutes at a time  -Educated on Cholesterol goals;  Benefits of statin for ASCVD risk reduction; Importance of limiting foods high in cholesterol; Exercise goal of 150 minutes per week; -Counseled on diet and exercise extensively Recommended trial of non-statin medication (zetia, PCSK9, nexlizet) - patient was not interested in starting a non-statin medication for her cholesterol, preferred to trial dietary changes first   Diabetes (A1c goal <7%) -Controlled Lab Results  Component Value Date   HGBA1C 5.9 08/17/2020  -Current medications: Metformin 500m24m1 tablet twice daily  -Medications previously tried: n/a  -Current home glucose readings fasting glucose: 102-112 - highest was 148, lowest was 88 -Denies hypoglycemic/hyperglycemic symptoms -Current meal patterns:  breakfast: almond milk with cereal, oatmeal w/ raisin + cinnamon  lunch: sandwich, ham/chicken/turkey cheese, salad on occasion   dinner: protein, rice, vegetables snacks: fruit drinks: water, tea with a small amount of sugar -Current exercise: using  exercise bike 3 times weekly for 30 minutes at a time  -Educated on A1c and blood sugar goals; Complications of diabetes including kidney damage, retinal damage, and cardiovascular disease; Exercise goal of 150 minutes per week; Prevention and management of hypoglycemic episodes; Benefits of routine self-monitoring of blood sugar; Carbohydrate counting and/or plate method -Counseled to check feet daily and get yearly eye exams -Counseled on diet and exercise extensively Recommended to continue current medication  Hypothyroidism (Goal: Maintenance of euthyroid levels) -Controlled Lab Results  Component Value Date   TSH 1.64 08/17/2020  -Current treatment  Levothyroxine 25mcg - 1 tablet daily  -Medications previously tried: n/a  -Counseled on diet and exercise extensively Recommended to continue current medication  Rheumatoid  Arthritis/ Fibromyalgia (Goal: Prevention of disease progression / pain control) -Not ideally controlled -Current treatment  Methotrexate 2.5mg - 20mg once a week Folic acid 1mg - 1 tablet daily Acetaminophen 500mg - 1-2 tablets every 6 hours as needed  Lyrica 25mg - 1-2 capsules twice daily as needed  - taking only when shoulder pain flares  -Medications previously tried: hydrocodone, naproxen, tramadol, percocet, cyclobenzaprine, methocarbamol,  -Recommended for patient to trial lidocaine 4% patches - 1 patch daily to affected area - 12 hours on / 12 hours off / trial of voltaren 1% gel 2g to affected area 4 times daily, patient is not using lyrica currently, only taking as needed - does not find it is very effective, likely as she is taking prn levels are unable to become therapeutic, patient is going to discus with pain management provider with next visit  Glaucoma (Goal: Control of Occular pressure) - Follows with Dr. Groat -Controlled -Current treatment  Brimonidine 0.2% ophthalmic solution - 1 drop into both eyes twice daily Latanoprost 0.005% - 1 drop into both eyes at bedtime  Systane eye drops - 1 drop into both eyes as needed  Ketotifen 0.025% ophthalmic solution - 1 drop into both eyes twice daily  -Medications previously tried: n/a  -Recommended to continue current medication  Insomnia (Goal: Promotion of quality sleep) -Not ideally controlled -Current treatment  Zolpidem 5mg - 1/2-1 tablet at bedtime - repeat 1/2-1 tablet 4 hours later if needed -reports to having issues staying asleep throughout the night - can wake up ~6 times a night  -Medications previously tried: temazepam, trazodone,   -Recommended referral to sleep medicine for further evaluation  Chronic Kidney Disease (Goal: Prevention of disease progression) -Controlled -Last eGFR - 52.40 mL/min -Last Scr - 1.01 mg/dL -Current treatment  Avoidance of nephrotoxic agents / adequate blood pressure and blood sugar  control to prevent kidney damage -Medications previously tried: n/a  -Recommended to continue current medication  Health Maintenance -Vaccine gaps: advised for patient to receiving shingles vaccine / COVID booster- flu shot completed today -Current therapy:  Meclizine 25mg - 1 tablet 3 times daily as needed  Miralax - 17g daily as needed  Vitamin D3 1000 units - 1 tablet daily  Preservision AERDS 2 - 1 capsule twice daily  -Educated on Cost vs benefit of each product must be carefully weighed by individual consumer -Patient is satisfied with current therapy and denies issues -Recommended to continue current medication  Patient Goals/Self-Care Activities Patient will:  - take medications as prescribed check glucose 3 times weekly, document, and provide at future appointments check blood pressure 3 times weekly, document, and provide at future appointments target a minimum of 150 minutes of moderate intensity exercise weekly engage in dietary modifications by reducing high cholesterol food   intake / moderation of carbs   Follow Up Plan: Telephone follow up appointment with care management team member scheduled for: 3 months  The patient has been provided with contact information for the care management team and has been advised to call with any health related questions or concerns.

## 2020-10-27 ENCOUNTER — Other Ambulatory Visit: Payer: Self-pay | Admitting: Internal Medicine

## 2020-11-07 DIAGNOSIS — E785 Hyperlipidemia, unspecified: Secondary | ICD-10-CM | POA: Diagnosis not present

## 2020-11-07 DIAGNOSIS — M06032 Rheumatoid arthritis without rheumatoid factor, left wrist: Secondary | ICD-10-CM

## 2020-11-07 DIAGNOSIS — I10 Essential (primary) hypertension: Secondary | ICD-10-CM

## 2020-11-07 DIAGNOSIS — N183 Chronic kidney disease, stage 3 unspecified: Secondary | ICD-10-CM | POA: Diagnosis not present

## 2020-11-07 DIAGNOSIS — M06031 Rheumatoid arthritis without rheumatoid factor, right wrist: Secondary | ICD-10-CM

## 2020-11-07 DIAGNOSIS — E119 Type 2 diabetes mellitus without complications: Secondary | ICD-10-CM | POA: Diagnosis not present

## 2020-11-07 DIAGNOSIS — E039 Hypothyroidism, unspecified: Secondary | ICD-10-CM | POA: Diagnosis not present

## 2020-11-12 ENCOUNTER — Other Ambulatory Visit: Payer: Self-pay | Admitting: Internal Medicine

## 2020-11-17 ENCOUNTER — Encounter: Payer: Self-pay | Admitting: Internal Medicine

## 2020-11-17 ENCOUNTER — Other Ambulatory Visit: Payer: Self-pay

## 2020-11-17 ENCOUNTER — Ambulatory Visit (INDEPENDENT_AMBULATORY_CARE_PROVIDER_SITE_OTHER): Payer: PPO | Admitting: Internal Medicine

## 2020-11-17 DIAGNOSIS — N183 Chronic kidney disease, stage 3 unspecified: Secondary | ICD-10-CM

## 2020-11-17 DIAGNOSIS — I10 Essential (primary) hypertension: Secondary | ICD-10-CM | POA: Diagnosis not present

## 2020-11-17 DIAGNOSIS — G4700 Insomnia, unspecified: Secondary | ICD-10-CM | POA: Diagnosis not present

## 2020-11-17 DIAGNOSIS — E119 Type 2 diabetes mellitus without complications: Secondary | ICD-10-CM

## 2020-11-17 LAB — COMPREHENSIVE METABOLIC PANEL
ALT: 11 U/L (ref 0–35)
AST: 19 U/L (ref 0–37)
Albumin: 4.3 g/dL (ref 3.5–5.2)
Alkaline Phosphatase: 77 U/L (ref 39–117)
BUN: 15 mg/dL (ref 6–23)
CO2: 32 mEq/L (ref 19–32)
Calcium: 10.7 mg/dL — ABNORMAL HIGH (ref 8.4–10.5)
Chloride: 103 mEq/L (ref 96–112)
Creatinine, Ser: 0.98 mg/dL (ref 0.40–1.20)
GFR: 54.24 mL/min — ABNORMAL LOW (ref >60.00)
Glucose, Bld: 100 mg/dL — ABNORMAL HIGH (ref 70–99)
Potassium: 4.2 mEq/L (ref 3.5–5.1)
Sodium: 140 mEq/L (ref 135–145)
Total Bilirubin: 0.5 mg/dL (ref 0.2–1.2)
Total Protein: 7 g/dL (ref 6.0–8.3)

## 2020-11-17 LAB — HEMOGLOBIN A1C: Hgb A1c MFr Bld: 6 % (ref 4.6–6.5)

## 2020-11-17 MED ORDER — MECLIZINE HCL 25 MG PO TABS: ORAL_TABLET | ORAL | 3 refills | Status: DC

## 2020-11-17 MED ORDER — ZOLPIDEM TARTRATE 10 MG PO TABS: ORAL_TABLET | ORAL | 5 refills | Status: DC

## 2020-11-17 NOTE — Assessment & Plan Note (Signed)
Worse Increase Zolpidem prn

## 2020-11-17 NOTE — Assessment & Plan Note (Signed)
On Metformin bid Check A1c

## 2020-11-17 NOTE — Assessment & Plan Note (Signed)
Nl BP at home Cont on amlodipine, Coreg  Lasix prn - c/o cramps - use 1/2 tab

## 2020-11-17 NOTE — Progress Notes (Signed)
Subjective:  Patient ID: Deanna Salazar, female    DOB: [birth year]  Age: 80 y.o. MRN: [id number]  CC: Follow-up (3 month f/u)   HPI Deanna Salazar presents for insomnia - sleeps 3-5 h/night F/u HTN, RA, DM  Outpatient Medications Prior to Visit  Medication Sig Dispense Refill   acetaminophen (TYLENOL) 500 MG tablet Take 500-1,000 mg by mouth every 6 (six) hours as needed for mild pain, moderate pain, fever or headache.     amLODipine (NORVASC) 10 MG tablet TAKE 1 TABLET(10 MG) BY MOUTH DAILY 90 tablet 3   aspirin (ASPIRIN CHILDRENS) 81 MG chewable tablet Chew 1 tablet (81 mg total) by mouth daily. 36 tablet 11   Blood Glucose Monitoring Suppl (ONE TOUCH ULTRA 2) w/Device KIT Use to check blood sugars daily 1 kit 0   brimonidine (ALPHAGAN) 0.2 % ophthalmic solution Place 1 drop into both eyes in the morning and at bedtime.     carvedilol (COREG) 25 MG tablet TAKE 1 TABLET(25 MG) BY MOUTH TWICE DAILY WITH A MEAL 180 tablet 1   cholecalciferol (VITAMIN D) 1000 units tablet Take 1,000 Units by mouth daily.     diclofenac Sodium (VOLTAREN) 1 % GEL Apply 2 g topically 4 (four) times daily.     folic acid (FOLVITE) 1 MG tablet Take 1 mg by mouth daily.     furosemide (LASIX) 20 MG tablet TAKE 1 TO 2 TABLETS(20 TO 40 MG) BY MOUTH DAILY AS NEEDED FOR SWELLING (Patient taking differently: Take 20 mg by mouth daily as needed for edema or fluid. Takes at least twice weekly) 60 tablet 5   glucose blood (ONETOUCH ULTRA) test strip TEST BLOOD SUGAR LEVELS DAILY 100 strip 3   ketotifen (ZADITOR) 0.025 % ophthalmic solution Place 1 drop into both eyes 2 (two) times daily.     Lancets (ONETOUCH DELICA PLUS [id number]) MISC USE TO TEST BLOOD SUGAR LEVELS 100 each 3   latanoprost (XALATAN) 0.005 % ophthalmic solution Place 1 drop into both eyes at bedtime.  3   levothyroxine (SYNTHROID) 25 MCG tablet TAKE 1 TABLET BY MOUTH ONCE DAILY BEFORE BREAKFAST 90 tablet 2   Lidocaine 4 % PTCH Apply 1 patch topically  daily. 12 hours on and 12 hours off     metFORMIN (GLUCOPHAGE) 500 MG tablet Take 1 tablet (500 mg total) by mouth 2 (two) times daily with a meal. 180 tablet 3   methotrexate 2.5 MG tablet Take 20 mg by mouth once a week.     Multiple Vitamins-Minerals (PRESERVISION AREDS 2+MULTI VIT PO) Take 1 capsule by mouth in the morning and at bedtime.     Polyethyl Glycol-Propyl Glycol (SYSTANE OP) Apply 1 drop to eye as needed.     Polyethylene Glycol 3350 (MIRALAX PO) Take by mouth.     pregabalin (LYRICA) 25 MG capsule TAKE 1 TO 2 CAPSULES(25 TO 50 MG) BY MOUTH TWICE DAILY AS NEEDED FOR PAIN 60 capsule 1   meclizine (ANTIVERT) 25 MG tablet TAKE 1 TABLET BY MOUTH THREE TIMES DAILY AS NEEDED FOR DIZZINESS OR NAUSEA 30 tablet 3   zolpidem (AMBIEN) 5 MG tablet TAKE 1/2 TO 1 TABLET BY MOUTH AT BEDTIME. REPEAT 0.5-1 TABLET IN 4 HOURS AS NEEDED 60 tablet 3   No facility-administered medications prior to visit.    ROS: Review of Systems  Constitutional:  Negative for activity change, appetite change, chills, fatigue and unexpected weight change.  HENT:  Negative for congestion, mouth sores and sinus pressure.  Eyes:  Negative for visual disturbance.  Respiratory:  Negative for cough and chest tightness.   Gastrointestinal:  Negative for abdominal pain and nausea.  Genitourinary:  Negative for difficulty urinating, frequency and vaginal pain.  Musculoskeletal:  Positive for arthralgias. Negative for back pain and gait problem.  Skin:  Negative for pallor and rash.  Neurological:  Negative for dizziness, tremors, weakness, numbness and headaches.  Psychiatric/Behavioral:  Positive for decreased concentration and sleep disturbance. Negative for confusion.    Objective:  BP 130/62 (BP Location: Left Arm)   Pulse 63   Temp 98.6 F (37 C) (Oral)   Ht 5' 3"  (1.6 m)   Wt 186 lb 12.8 oz (84.7 kg)   SpO2 97%   BMI 33.09 kg/m   BP Readings from Last 3 Encounters:  11/17/20 130/62  08/17/20 138/70   02/23/20 128/70    Wt Readings from Last 3 Encounters:  11/17/20 186 lb 12.8 oz (84.7 kg)  08/17/20 187 lb 3.2 oz (84.9 kg)  02/23/20 191 lb 9.6 oz (86.9 kg)    Physical Exam Constitutional:      General: She is not in acute distress.    Appearance: She is well-developed. She is obese.  HENT:     Head: Normocephalic.     Right Ear: External ear normal.     Left Ear: External ear normal.     Nose: Nose normal.  Eyes:     General:        Right eye: No discharge.        Left eye: No discharge.     Conjunctiva/sclera: Conjunctivae normal.     Pupils: Pupils are equal, round, and reactive to light.  Neck:     Thyroid: No thyromegaly.     Vascular: No JVD.     Trachea: No tracheal deviation.  Cardiovascular:     Rate and Rhythm: Normal rate and regular rhythm.     Heart sounds: Normal heart sounds.  Pulmonary:     Effort: No respiratory distress.     Breath sounds: No stridor. No wheezing.  Abdominal:     General: Bowel sounds are normal. There is no distension.     Palpations: Abdomen is soft. There is no mass.     Tenderness: There is no abdominal tenderness. There is no guarding or rebound.  Musculoskeletal:        General: No tenderness.     Cervical back: Normal range of motion and neck supple. No rigidity.  Lymphadenopathy:     Cervical: No cervical adenopathy.  Skin:    Findings: No erythema or rash.  Neurological:     Mental Status: She is oriented to person, place, and time.     Cranial Nerves: No cranial nerve deficit.     Motor: No abnormal muscle tone.     Coordination: Coordination normal.     Deep Tendon Reflexes: Reflexes normal.  Psychiatric:        Behavior: Behavior normal.        Thought Content: Thought content normal.        Judgment: Judgment normal.    Lab Results  Component Value Date   WBC 6.0 08/17/2020   HGB 11.4 (L) 08/17/2020   HCT 35.4 (L) 08/17/2020   PLT 208.0 08/17/2020   GLUCOSE 91 08/17/2020   CHOL 187 08/17/2020   TRIG  132.0 08/17/2020   HDL 46.00 08/17/2020   LDLDIRECT 180.9 07/26/2009   LDLCALC 114 (H) 08/17/2020   ALT 8 08/17/2020  AST 17 08/17/2020   NA 141 08/17/2020   K 3.8 08/17/2020   CL 103 08/17/2020   CREATININE 1.01 08/17/2020   BUN 19 08/17/2020   CO2 28 08/17/2020   TSH 1.64 08/17/2020   HGBA1C 5.9 08/17/2020   MICROALBUR <0.7 02/23/2020    MM 3D SCREEN BREAST BILATERAL  Result Date: 04/05/2020 CLINICAL DATA:  Screening. EXAM: DIGITAL SCREENING BILATERAL MAMMOGRAM WITH TOMOSYNTHESIS AND CAD TECHNIQUE: Bilateral screening digital craniocaudal and mediolateral oblique mammograms were obtained. Bilateral screening digital breast tomosynthesis was performed. The images were evaluated with computer-aided detection. COMPARISON:  Previous exam(s). ACR Breast Density Category b: There are scattered areas of fibroglandular density. FINDINGS: There are no findings suspicious for malignancy. The images were evaluated with computer-aided detection. IMPRESSION: No mammographic evidence of malignancy. A result letter of this screening mammogram will be mailed directly to the patient. RECOMMENDATION: Screening mammogram in one year. (Code:SM-B-01Y) BI-RADS CATEGORY  1: Negative. Electronically Signed   By: Kristopher Oppenheim M.D.   On: 04/05/2020 10:06    Assessment & Plan:   Problem List Items Addressed This Visit     CRF (chronic renal failure), stage 3 (moderate) (HCC)    Monitoring GFR      Relevant Orders   Hemoglobin A1c   Comprehensive metabolic panel   Diabetes type 2, controlled (HCC)    On Metformin bid Check A1c      Relevant Orders   Hemoglobin A1c   Essential hypertension    Nl BP at home Cont on amlodipine, Coreg  Lasix prn - c/o cramps - use 1/2 tab      INSOMNIA, PERSISTENT    Worse Increase Zolpidem prn         Meds ordered this encounter  Medications   zolpidem (AMBIEN) 10 MG tablet    Sig: Take 10 mg at hs prn. Can repeat 5-10 mg in 4 hrs if needed.     Dispense:  60 tablet    Refill:  5   meclizine (ANTIVERT) 25 MG tablet    Sig: TAKE 1 TABLET BY MOUTH THREE TIMES DAILY AS NEEDED FOR DIZZINESS OR NAUSEA    Dispense:  30 tablet    Refill:  3      Follow-up: Return in about 3 months (around 02/17/2021) for a follow-up visit.  Walker Kehr, MD

## 2020-11-17 NOTE — Assessment & Plan Note (Signed)
Monitoring GFR 

## 2020-11-17 NOTE — Addendum Note (Signed)
Addended by: Jacobo Forest on: 11/17/2020 10:19 AM   Modules accepted: Orders

## 2020-12-06 ENCOUNTER — Telehealth: Payer: Self-pay | Admitting: Internal Medicine

## 2020-12-06 NOTE — Telephone Encounter (Signed)
Called pt inform her since she has [employer] [facility]) she will have to get hers from the pharmacy.Deanna KitchenJohny Chess

## 2020-12-06 NOTE — Telephone Encounter (Signed)
Pt. Called and is requesting a shingles vaccine. Has never had one before. Wanting to know if it can be done at pharmacy, or should she come in office.   Please advise.    Callback #- [phone]

## 2020-12-27 ENCOUNTER — Telehealth (INDEPENDENT_AMBULATORY_CARE_PROVIDER_SITE_OTHER): Payer: PPO | Admitting: Family Medicine

## 2020-12-27 ENCOUNTER — Encounter: Payer: Self-pay | Admitting: Family Medicine

## 2020-12-27 VITALS — Temp 98.7°F

## 2020-12-27 DIAGNOSIS — R0981 Nasal congestion: Secondary | ICD-10-CM | POA: Diagnosis not present

## 2020-12-27 DIAGNOSIS — R059 Cough, unspecified: Secondary | ICD-10-CM | POA: Diagnosis not present

## 2020-12-27 MED ORDER — BENZONATATE 100 MG PO CAPS: ORAL_CAPSULE | ORAL | 0 refills | Status: DC

## 2020-12-27 NOTE — Progress Notes (Signed)
Virtual Visit via Video Note  I connected with Deanna Salazar  on 12/27/20 at 10:20 AM EST by a video enabled telemedicine application and verified that I am speaking with the correct person using two identifiers.  Location patient: home, [place] Location provider:work or home office Persons participating in the virtual visit: patient, provider  I discussed the limitations of evaluation and management by telemedicine and the availability of in person appointments. The patient expressed understanding and agreed to proceed.   HPI:  Acute telemedicine visit for Covid19: -Onset: about 1 week ago; tested positive for covid about 6 days ago -daughter was sick too -Symptoms include: ran fevers and had body aches initially, sneezing, sinus congestion, cough -feels a lot better today -Denies:SOB, CP, NVD, inability to eat/drink/get  -Has tried:tylenol, mucinex -Pertinent past medical history:see below -Pertinent medication allergies: Allergies  Allergen Reactions   Codeine Nausea And Vomiting and Other (See Comments)    Pt states that she is able to take on a short term basis.     Hydrocodone Nausea And Vomiting and Other (See Comments)    Pt states that she is able to take on a short term basis.     Hydrocodone-Acetaminophen     Other reaction(s): Unknown   Lovastatin Itching and Other (See Comments)    Reaction:  Leg cramps    Pantoprazole     ?abd pain   Pepcid [Famotidine]     ?abd pain   Telmisartan     The patient thinks that telmisartan is making her sneeze   Tramadol Hcl Itching   Trazodone And Nefazodone     Nightmares  -COVID-19 vaccine status:  Immunization History  Administered Date(s) Administered   Fluad Quad(high Dose 65+) 09/08/2018, 11/05/2019, 10/11/2020   Influenza Split 10/30/2010, 01/16/2012   Influenza Whole 03/03/2009   Influenza, High Dose Seasonal PF 12/06/2014, 10/04/2015, 11/23/2016, 10/11/2017   Influenza,inj,Quad PF,6+ Mos 09/24/2012, 01/04/2014    PFIZER(Purple Top)SARS-COV-2 Vaccination 03/19/2019, 04/13/2019, 10/20/2019   PNEUMOCOCCAL CONJUGATE-20 08/17/2020   Pneumococcal Conjugate-13 04/05/2014   Pneumococcal Polysaccharide-23 12/06/2014   Td 04/05/2014     ROS: See pertinent positives and negatives per HPI.  Past Medical History:  Diagnosis Date   Abdominal pain, generalized 02/05/2008   ABDOMINAL PAIN-EPIGASTRIC 03/24/2009   Acute bronchitis 02/15/2007   Adenomatous colon polyp 04/2000   Arthritis    ASCUS favor benign 10/2011   negative high risk HPV screen   BACK PAIN 07/20/2008   Breast cancer ([place]) 2009   l, hx of XRT Dr Benay Spice, Left side   BREAST CANCER, HX OF 04/16/2007   CHEST PAIN 02/23/2010   COLONIC POLYPS, ADENOMATOUS, HX OF 10/01/2007   CONSTIPATION, CHRONIC 05/10/2007   Dysuria 04/06/2008   FATIGUE 12/26/2006   Fatty liver    FATTY LIVER DISEASE 11/19/2006   FIBROMYALGIA 11/19/2006   GERD 11/19/2006   Dr Fuller Plan   Glaucoma 07/2017   left eye   HEMATOCHEZIA 05/10/2007   HEMORRHOIDS, INTERNAL 05/10/2007   HIP PAIN 07/20/2008   HYPERLIPIDEMIA 07/26/2009   HYPERTENSION 12/26/2006   HYPOKALEMIA 11/25/2007   HYPOTHYROIDISM 12/26/2006   IBS 11/19/2006   INSOMNIA, PERSISTENT 12/26/2006   KELOID 07/25/2007   MURMUR 07/25/2007   Nausea alone 03/23/2010   PARESTHESIA 04/06/2008   Personal history of radiation therapy    RENAL CYST, LEFT 02/23/2010   Rheumatoid arthritis(714.0) 11/19/2006   Dr Vianne Bulls   RUQ PAIN 11/07/2009   Tubular adenoma    VISION IMPAIRMENT, LOW VISION, ONE EYE-LEFT 07/25/2007    Past  Surgical History:  Procedure Laterality Date   BREAST LUMPECTOMY Left 2009   BREAST SURGERY  2009   Left lumpectomy   COLONOSCOPY  06/12/2012   Cyst excision from chest     knee surgery Left    Left thyroidectomy     OS Cataract extraction  2009   OVARIAN CYST REMOVAL  age 19   SHOULDER SURGERY     both shoulders   TOE SURGERY     TUBAL LIGATION       Current Outpatient Medications:    acetaminophen  (TYLENOL) 500 MG tablet, Take 500-1,000 mg by mouth every 6 (six) hours as needed for mild pain, moderate pain, fever or headache., Disp: , Rfl:    amLODipine (NORVASC) 10 MG tablet, TAKE 1 TABLET(10 MG) BY MOUTH DAILY, Disp: 90 tablet, Rfl: 3   aspirin (ASPIRIN CHILDRENS) 81 MG chewable tablet, Chew 1 tablet (81 mg total) by mouth daily., Disp: 36 tablet, Rfl: 11   Blood Glucose Monitoring Suppl (ONE TOUCH ULTRA 2) w/Device KIT, Use to check blood sugars daily, Disp: 1 kit, Rfl: 0   brimonidine (ALPHAGAN) 0.2 % ophthalmic solution, Place 1 drop into both eyes in the morning and at bedtime., Disp: , Rfl:    carvedilol (COREG) 25 MG tablet, TAKE 1 TABLET(25 MG) BY MOUTH TWICE DAILY WITH A MEAL, Disp: 180 tablet, Rfl: 1   cholecalciferol (VITAMIN D) 1000 units tablet, Take 1,000 Units by mouth daily., Disp: , Rfl:    diclofenac Sodium (VOLTAREN) 1 % GEL, Apply 2 g topically 4 (four) times daily., Disp: , Rfl:    folic acid (FOLVITE) 1 MG tablet, Take 1 mg by mouth daily., Disp: , Rfl:    furosemide (LASIX) 20 MG tablet, TAKE 1 TO 2 TABLETS(20 TO 40 MG) BY MOUTH DAILY AS NEEDED FOR SWELLING (Patient taking differently: Take 20 mg by mouth daily as needed for edema or fluid. Takes at least twice weekly), Disp: 60 tablet, Rfl: 5   glucose blood (ONETOUCH ULTRA) test strip, TEST BLOOD SUGAR LEVELS DAILY, Disp: 100 strip, Rfl: 3   ketotifen (ZADITOR) 0.025 % ophthalmic solution, Place 1 drop into both eyes 2 (two) times daily., Disp: , Rfl:    Lancets (ONETOUCH DELICA PLUS [id number]) MISC, USE TO TEST BLOOD SUGAR LEVELS, Disp: 100 each, Rfl: 3   latanoprost (XALATAN) 0.005 % ophthalmic solution, Place 1 drop into both eyes at bedtime., Disp: , Rfl: 3   levothyroxine (SYNTHROID) 25 MCG tablet, TAKE 1 TABLET BY MOUTH ONCE DAILY BEFORE BREAKFAST, Disp: 90 tablet, Rfl: 2   Lidocaine 4 % PTCH, Apply 1 patch topically daily. 12 hours on and 12 hours off, Disp: , Rfl:    meclizine (ANTIVERT) 25 MG tablet, TAKE 1  TABLET BY MOUTH THREE TIMES DAILY AS NEEDED FOR DIZZINESS OR NAUSEA, Disp: 30 tablet, Rfl: 3   metFORMIN (GLUCOPHAGE) 500 MG tablet, Take 1 tablet (500 mg total) by mouth 2 (two) times daily with a meal., Disp: 180 tablet, Rfl: 3   methotrexate 2.5 MG tablet, Take 20 mg by mouth once a week., Disp: , Rfl:    Multiple Vitamins-Minerals (PRESERVISION AREDS 2+MULTI VIT PO), Take 1 capsule by mouth in the morning and at bedtime., Disp: , Rfl:    Polyethyl Glycol-Propyl Glycol (SYSTANE OP), Apply 1 drop to eye as needed., Disp: , Rfl:    Polyethylene Glycol 3350 (MIRALAX PO), Take by mouth., Disp: , Rfl:    zolpidem (AMBIEN) 10 MG tablet, Take 10 mg at hs  prn. Can repeat 5-10 mg in 4 hrs if needed., Disp: 60 tablet, Rfl: 5  EXAM:  VITALS per patient if applicable:  GENERAL: alert, oriented, appears well and in no acute distress  HEENT: atraumatic, conjunttiva clear, no obvious abnormalities on inspection of external nose and ears  NECK: normal movements of the head and neck  LUNGS: on inspection no signs of respiratory distress, breathing rate appears normal, no obvious gross SOB, gasping or wheezing  CV: no obvious cyanosis  MS: moves all visible extremities without noticeable abnormality  PSYCH/NEURO: pleasant and cooperative, no obvious depression or anxiety, speech and thought processing grossly intact  ASSESSMENT AND PLAN:  Discussed the following assessment and plan:  Cough, unspecified type  Nasal congestion  -we discussed possible serious and likely etiologies, options for evaluation and workup, limitations of telemedicine visit vs in person visit, treatment, treatment risks and precautions. Pt is agreeable to treatment via telemedicine at this moment. She is still testing positive for covid and has a cough and sinus congestion, but overall is improving. Is out of the treatment window for antiviral med. Opted for tessalon rx for cough. Other symptomatic care measures per patient  instructions.  Advised to seek prompt virtual visit follow up or in person care if worsening, new symptoms arise, or if is not improving with treatment. Discussed options for inperson care if PCP office not available. Did let this patient know that I only do telemedicine on Tuesdays and Thursdays for [place]. Advised to schedule follow up visit with PCP or [facility] if any further questions or concerns to avoid delays in care.   I discussed the assessment and treatment plan with the patient. The patient was provided an opportunity to ask questions and all were answered. The patient agreed with the plan and demonstrated an understanding of the instructions.     Deanna Kern, DO

## 2020-12-27 NOTE — Patient Instructions (Addendum)
-  I sent the medication(s) we discussed to your pharmacy: Meds ordered this encounter  Medications   benzonatate (TESSALON PERLES) 100 MG capsule    Sig: 1-2 capsules up to twice daily as needed for cough    Dispense:  30 capsule    Refill:  0    Nasal saline twice daily.  I hope you are feeling better soon!  Seek in person care promptly if your symptoms worsen, new concerns arise or you are not improving with treatment.  It was nice to meet you today. I help Van out with telemedicine visits on Tuesdays and Thursdays and am happy to help if you need a virtual follow up visit on those days. Otherwise, if you have any concerns or questions following this visit please schedule a follow up visit with your Primary Care office or seek care at a local urgent care clinic to avoid delays in care

## 2021-01-03 ENCOUNTER — Telehealth: Payer: Self-pay

## 2021-01-03 NOTE — Telephone Encounter (Signed)
Patient calling back in  Wanted to make sure provider got her message left earlier this morning  Advised patient message was sent to provider & is awaiting his response

## 2021-01-03 NOTE — Telephone Encounter (Signed)
Pt tested POS COVID 12/15 had virtual visit with Colin Benton the meds that she Rx benzonatate 100mg , mucinex, and Delsym isn't working. Pt is requesting something different possibly the antiviral.  Pt update patient further instructions.

## 2021-01-04 ENCOUNTER — Telehealth (INDEPENDENT_AMBULATORY_CARE_PROVIDER_SITE_OTHER): Payer: PPO | Admitting: Family Medicine

## 2021-01-04 VITALS — Temp 98.3°F | Ht 63.0 in | Wt 186.0 lb

## 2021-01-04 DIAGNOSIS — U071 COVID-19: Secondary | ICD-10-CM | POA: Diagnosis not present

## 2021-01-04 DIAGNOSIS — R059 Cough, unspecified: Secondary | ICD-10-CM | POA: Diagnosis not present

## 2021-01-04 MED ORDER — PROMETHAZINE-DM 6.25-15 MG/5ML PO SYRP: 5.0000 mL | ORAL_SOLUTION | Freq: Four times a day (QID) | ORAL | 0 refills | Status: DC | PRN

## 2021-01-04 NOTE — Telephone Encounter (Signed)
Called pt sat up VV today 12/28. I advised pt of Dr. Alain Marion recommendations and told pt about Rx for cough.   Pt expressed understand and agreed.

## 2021-01-04 NOTE — Telephone Encounter (Signed)
It is too late for an antiviral.  I will send a cough medicine prescription.  Please schedule virtual or regular office visit with any provider. Thanks

## 2021-01-04 NOTE — Progress Notes (Signed)
Patient ID: Deanna Salazar, female   DOB: [birth year], 80 y.o.   MRN: [id number]   This visit type was conducted due to national recommendations for restrictions regarding the COVID-19 pandemic in an effort to limit this patient's exposure and mitigate transmission in our community.   Virtual Visit via Video Note  I connected with Deanna Salazar on 01/04/21 at  5:15 PM EST by a video enabled telemedicine application and verified that I am speaking with the correct person using two identifiers.  Location patient: home Location provider:work or home office Persons participating in the virtual visit: patient, provider  I discussed the limitations of evaluation and management by telemedicine and the availability of in person appointments. The patient expressed understanding and agreed to proceed.   HPI: Deanna Salazar had COVID 19 diagnosis December 15.  She was not prescribed any antivirals.  She is mostly concerned now because of some persistent dry cough.  No fever.  No dyspnea.  Occasional frontal sinus headaches.  Has little bit of clear nasal mucus.  No bloody or purulent nasal secretions.  For cough she was initially taking Mucinex DM without relief.  She then tried Tessalon without much relief.  She had prescription of Promethazine DM sent in earlier today and that does seem to have helped.   ROS: See pertinent positives and negatives per HPI.  Past Medical History:  Diagnosis Date   Abdominal pain, generalized 02/05/2008   ABDOMINAL PAIN-EPIGASTRIC 03/24/2009   Acute bronchitis 02/15/2007   Adenomatous colon polyp 04/2000   Arthritis    ASCUS favor benign 10/2011   negative high risk HPV screen   BACK PAIN 07/20/2008   Breast cancer ([place]) 2009   l, hx of XRT Dr Benay Spice, Left side   BREAST CANCER, HX OF 04/16/2007   CHEST PAIN 02/23/2010   COLONIC POLYPS, ADENOMATOUS, HX OF 10/01/2007   CONSTIPATION, CHRONIC 05/10/2007   Dysuria 04/06/2008   FATIGUE 12/26/2006   Fatty liver    FATTY LIVER  DISEASE 11/19/2006   FIBROMYALGIA 11/19/2006   GERD 11/19/2006   Dr Fuller Plan   Glaucoma 07/2017   left eye   HEMATOCHEZIA 05/10/2007   HEMORRHOIDS, INTERNAL 05/10/2007   HIP PAIN 07/20/2008   HYPERLIPIDEMIA 07/26/2009   HYPERTENSION 12/26/2006   HYPOKALEMIA 11/25/2007   HYPOTHYROIDISM 12/26/2006   IBS 11/19/2006   INSOMNIA, PERSISTENT 12/26/2006   KELOID 07/25/2007   MURMUR 07/25/2007   Nausea alone 03/23/2010   PARESTHESIA 04/06/2008   Personal history of radiation therapy    RENAL CYST, LEFT 02/23/2010   Rheumatoid arthritis(714.0) 11/19/2006   Dr Vianne Bulls   RUQ PAIN 11/07/2009   Tubular adenoma    VISION IMPAIRMENT, LOW VISION, ONE EYE-LEFT 07/25/2007    Past Surgical History:  Procedure Laterality Date   BREAST LUMPECTOMY Left 2009   BREAST SURGERY  2009   Left lumpectomy   COLONOSCOPY  06/12/2012   Cyst excision from chest     knee surgery Left    Left thyroidectomy     OS Cataract extraction  2009   OVARIAN CYST REMOVAL  age 44   SHOULDER SURGERY     both shoulders   TOE SURGERY     TUBAL LIGATION      Family History  Problem Relation Age of Onset   Lung cancer Brother    Colon cancer Brother 74       50's   Diabetes Father    Brain cancer Brother    Stroke Sister    Breast cancer  Neg Hx     SOCIAL HX: Non-smoker   Current Outpatient Medications:    acetaminophen (TYLENOL) 500 MG tablet, Take 500-1,000 mg by mouth every 6 (six) hours as needed for mild pain, moderate pain, fever or headache., Disp: , Rfl:    amLODipine (NORVASC) 10 MG tablet, TAKE 1 TABLET(10 MG) BY MOUTH DAILY, Disp: 90 tablet, Rfl: 3   aspirin (ASPIRIN CHILDRENS) 81 MG chewable tablet, Chew 1 tablet (81 mg total) by mouth daily., Disp: 36 tablet, Rfl: 11   benzonatate (TESSALON PERLES) 100 MG capsule, 1-2 capsules up to twice daily as needed for cough, Disp: 30 capsule, Rfl: 0   Blood Glucose Monitoring Suppl (ONE TOUCH ULTRA 2) w/Device KIT, Use to check blood sugars daily, Disp: 1 kit, Rfl:  0   brimonidine (ALPHAGAN) 0.2 % ophthalmic solution, Place 1 drop into both eyes in the morning and at bedtime., Disp: , Rfl:    carvedilol (COREG) 25 MG tablet, TAKE 1 TABLET(25 MG) BY MOUTH TWICE DAILY WITH A MEAL, Disp: 180 tablet, Rfl: 1   cholecalciferol (VITAMIN D) 1000 units tablet, Take 1,000 Units by mouth daily., Disp: , Rfl:    diclofenac Sodium (VOLTAREN) 1 % GEL, Apply 2 g topically 4 (four) times daily., Disp: , Rfl:    folic acid (FOLVITE) 1 MG tablet, Take 1 mg by mouth daily., Disp: , Rfl:    furosemide (LASIX) 20 MG tablet, TAKE 1 TO 2 TABLETS(20 TO 40 MG) BY MOUTH DAILY AS NEEDED FOR SWELLING (Patient taking differently: Take 20 mg by mouth daily as needed for edema or fluid. Takes at least twice weekly), Disp: 60 tablet, Rfl: 5   glucose blood (ONETOUCH ULTRA) test strip, TEST BLOOD SUGAR LEVELS DAILY, Disp: 100 strip, Rfl: 3   ketotifen (ZADITOR) 0.025 % ophthalmic solution, Place 1 drop into both eyes 2 (two) times daily., Disp: , Rfl:    Lancets (ONETOUCH DELICA PLUS [id number]) MISC, USE TO TEST BLOOD SUGAR LEVELS, Disp: 100 each, Rfl: 3   latanoprost (XALATAN) 0.005 % ophthalmic solution, Place 1 drop into both eyes at bedtime., Disp: , Rfl: 3   levothyroxine (SYNTHROID) 25 MCG tablet, TAKE 1 TABLET BY MOUTH ONCE DAILY BEFORE BREAKFAST, Disp: 90 tablet, Rfl: 2   Lidocaine 4 % PTCH, Apply 1 patch topically daily. 12 hours on and 12 hours off, Disp: , Rfl:    meclizine (ANTIVERT) 25 MG tablet, TAKE 1 TABLET BY MOUTH THREE TIMES DAILY AS NEEDED FOR DIZZINESS OR NAUSEA, Disp: 30 tablet, Rfl: 3   metFORMIN (GLUCOPHAGE) 500 MG tablet, Take 1 tablet (500 mg total) by mouth 2 (two) times daily with a meal., Disp: 180 tablet, Rfl: 3   methotrexate 2.5 MG tablet, Take 20 mg by mouth once a week., Disp: , Rfl:    Multiple Vitamins-Minerals (PRESERVISION AREDS 2+MULTI VIT PO), Take 1 capsule by mouth in the morning and at bedtime., Disp: , Rfl:    Polyethyl Glycol-Propyl Glycol (SYSTANE  OP), Apply 1 drop to eye as needed., Disp: , Rfl:    Polyethylene Glycol 3350 (MIRALAX PO), Take by mouth., Disp: , Rfl:    promethazine-dextromethorphan (PROMETHAZINE-DM) 6.25-15 MG/5ML syrup, Take 5 mLs by mouth 4 (four) times daily as needed for cough., Disp: 240 mL, Rfl: 0   zolpidem (AMBIEN) 10 MG tablet, Take 10 mg at hs prn. Can repeat 5-10 mg in 4 hrs if needed., Disp: 60 tablet, Rfl: 5  EXAM:  VITALS per patient if applicable:  GENERAL: alert, oriented, appears well  and in no acute distress  HEENT: atraumatic, conjunttiva clear, no obvious abnormalities on inspection of external nose and ears  NECK: normal movements of the head and neck  LUNGS: on inspection no signs of respiratory distress, breathing rate appears normal, no obvious gross SOB, gasping or wheezing  CV: no obvious cyanosis  MS: moves all visible extremities without noticeable abnormality  PSYCH/NEURO: pleasant and cooperative, no obvious depression or anxiety, speech and thought processing grossly intact  ASSESSMENT AND PLAN:  Discussed the following assessment and plan:   Recent COVID-19 infection couple weeks ago.  Patient has some persistent cough now but no fever or dyspnea.  We explained that cough of a few weeks duration is not unusual post COVID.  She was sent in prescription already today earlier of promethazine DM which seems to be helping her cough.  She cannot tolerate codeine or hydrocodone.  -Follow-up promptly for any fever or dyspnea. -Follow-up with primary in 2 to 3 weeks if cough not resolving    I discussed the assessment and treatment plan with the patient. The patient was provided an opportunity to ask questions and all were answered. The patient agreed with the plan and demonstrated an understanding of the instructions.   The patient was advised to call back or seek an in-person evaluation if the symptoms worsen or if the condition fails to improve as anticipated.     Carolann Littler, MD

## 2021-01-11 ENCOUNTER — Ambulatory Visit (INDEPENDENT_AMBULATORY_CARE_PROVIDER_SITE_OTHER): Payer: PPO

## 2021-01-11 DIAGNOSIS — E785 Hyperlipidemia, unspecified: Secondary | ICD-10-CM

## 2021-01-11 DIAGNOSIS — I10 Essential (primary) hypertension: Secondary | ICD-10-CM

## 2021-01-11 DIAGNOSIS — E119 Type 2 diabetes mellitus without complications: Secondary | ICD-10-CM

## 2021-01-11 DIAGNOSIS — E039 Hypothyroidism, unspecified: Secondary | ICD-10-CM

## 2021-01-11 NOTE — Progress Notes (Addendum)
Chronic Care Management Pharmacy Note  01/11/2021 Name:  Deanna Salazar MRN:  [id number] DOB:  Mar 09, 1940  Summary: -Patient continues to recover from recent COVID infection - tested positive ~12/22/20 - notes that she has not had a fever recently, denies any SOB, reports to a non productive cough / sore throat / hoarseness - has been using promethazine DM  -Patient continues to check BP at home - averaging 116/60's - denies any issues with low blood pressures  -Checking BG at home, averaging 107-116 - seldom will elevate >120 -Has not been seen by pain management provider, no changes to medications, has not trialed lidocaine patches / restart voltaren gel as discussed with last appointment   Recommendations/Changes made from today's visit: -Patient to continue use of promethazine DM for cough, encouraged adequate hydration as well, patient to continue to monitor for fever / SOB / worsening cough - reach out to PCP / proceed to urgent care for any issues  -No changes to medications, patient to continue monitoring BG and BP at least 3 times weekly, reach out should either become uncontrolled   Subjective: Deanna Salazar is an 81 y.o. year old female who is a primary patient of Plotnikov, Evie Lacks, MD.  The CCM team was consulted for assistance with disease management and care coordination needs.    Engaged with patient by telephone for follow up visit in response to provider referral for pharmacy case management and/or care coordination services.   Consent to Services:  The patient was given the following information about Chronic Care Management services today, agreed to services, and gave verbal consent: 1. CCM service includes personalized support from designated clinical staff supervised by the primary care provider, including individualized plan of care and coordination with other care providers 2. 24/7 contact phone numbers for assistance for urgent and routine care needs. 3. Service  will only be billed when office clinical staff spend 20 minutes or more in a month to coordinate care. 4. Only one practitioner may furnish and bill the service in a calendar month. 5.The patient may stop CCM services at any time (effective at the end of the month) by phone call to the office staff. 6. The patient will be responsible for cost sharing (co-pay) of up to 20% of the service fee (after annual deductible is met). Patient agreed to services and consent obtained.  Patient Care Team: Plotnikov, Evie Lacks, MD as PCP - General Amil Amen Irven Easterly, MD (Rheumatology) Newt Minion, MD (Orthopedic Surgery) Rosita Kea, PA-C (Inactive) (Rheumatology) Delice Bison Darnelle Maffucci, [facility] as Pharmacist (Pharmacist)  Recent office visits:  01/04/2021 - Dr. Elease Hashimoto - [facility] - televideo visit - COVID 19 - covid positive 12/15 - had not been on antiviral - continue with promethazine DM 12/27/2020 - Dr. Maudie Mercury - [facility] - COVID 19 - tested positive about 6 days ago - tessalon for cough  11/17/2020 - Dr. Alain Marion - evaluation of insomnia - zolpidem increased to 44m prn    Recent consult visits:  05/10/20-Erin JRichardean CanalNotes (Rheumatology) not available 04/18/20-Christompher GKaty Fitch (Ophthalmology) Notes not available.   Hospital visits:  None in previous 6 months  Objective:  Lab Results  Component Value Date   CREATININE 0.98 11/17/2020   BUN 15 11/17/2020   GFR 54.24 (L) 11/17/2020   GFRNONAA 64 (L) 09/28/2013   GFRAA 74 (L) 09/28/2013   NA 140 11/17/2020   K 4.2 11/17/2020   CALCIUM 10.7 (H) 11/17/2020   CO2  32 11/17/2020   GLUCOSE 100 (H) 11/17/2020    Lab Results  Component Value Date/Time   HGBA1C 6.0 11/17/2020 10:19 AM   HGBA1C 5.9 08/17/2020 11:20 AM   GFR 54.24 (L) 11/17/2020 10:19 AM   GFR 52.40 (L) 08/17/2020 11:20 AM   MICROALBUR <0.7 02/23/2020 09:58 AM   MICROALBUR 1.8 09/08/2018 10:14 AM    Last diabetic Eye exam:  Lab Results  Component  Value Date/Time   HMDIABEYEEXA No Retinopathy 08/23/2020 12:00 AM    Last diabetic Foot exam:  No results found for: HMDIABFOOTEX   Lab Results  Component Value Date   CHOL 187 08/17/2020   HDL 46.00 08/17/2020   LDLCALC 114 (H) 08/17/2020   LDLDIRECT 180.9 07/26/2009   TRIG 132.0 08/17/2020   CHOLHDL 4 08/17/2020    Hepatic Function Latest Ref Rng & Units 11/17/2020 08/17/2020 02/23/2020  Total Protein 6.0 - 8.3 g/dL 7.0 6.8 7.1  Albumin 3.5 - 5.2 g/dL 4.3 4.0 4.1  AST 0 - 37 U/L 19 17 15   ALT 0 - 35 U/L 11 8 9   Alk Phosphatase 39 - 117 U/L 77 80 82  Total Bilirubin 0.2 - 1.2 mg/dL 0.5 0.4 0.3  Bilirubin, Direct 0.0 - 0.3 mg/dL - - -    Lab Results  Component Value Date/Time   TSH 1.64 08/17/2020 11:20 AM   TSH 2.09 02/23/2020 09:58 AM   FREET4 0.75 08/17/2020 11:20 AM   FREET4 0.67 04/22/2019 04:04 PM    CBC Latest Ref Rng & Units 08/17/2020 02/23/2020 04/22/2019  WBC 4.0 - 10.5 K/uL 6.0 6.3 8.2  Hemoglobin 12.0 - 15.0 g/dL 11.4(L) 12.2 12.5  Hematocrit 36.0 - 46.0 % 35.4(L) 37.1 37.8  Platelets 150.0 - 400.0 K/uL 208.0 234.0 203.0    Lab Results  Component Value Date/Time   VD25OH 60.91 04/22/2019 04:04 PM   VD25OH 52 06/24/2012 08:43 AM   VD25OH 57 07/26/2009 10:42 PM    Clinical ASCVD: No  The ASCVD Risk score (Arnett DK, et al., 2019) failed to calculate for the following reasons:   The 2019 ASCVD risk score is only valid for ages 33 to 49    Depression screen PHQ 2/9 11/17/2020 08/17/2020 02/23/2020  Decreased Interest 0 0 0  Down, Depressed, Hopeless 0 0 0  PHQ - 2 Score 0 0 0  Altered sleeping 0 0 -  Tired, decreased energy 0 0 -  Change in appetite 0 0 -  Feeling bad or failure about yourself  0 0 -  Trouble concentrating 0 0 -  Moving slowly or fidgety/restless 0 0 -  Suicidal thoughts 0 0 -  PHQ-9 Score 0 0 -  Difficult doing work/chores - - -  Some recent data might be hidden    Social History   Tobacco Use  Smoking Status Never  Smokeless  Tobacco Never   BP Readings from Last 3 Encounters:  11/17/20 130/62  08/17/20 138/70  02/23/20 128/70   Pulse Readings from Last 3 Encounters:  11/17/20 63  02/23/20 64  02/23/20 64   Wt Readings from Last 3 Encounters:  01/04/21 186 lb (84.4 kg)  11/17/20 186 lb 12.8 oz (84.7 kg)  08/17/20 187 lb 3.2 oz (84.9 kg)   BMI Readings from Last 3 Encounters:  01/04/21 32.95 kg/m  11/17/20 33.09 kg/m  08/17/20 33.16 kg/m    Assessment/Interventions: Review of patient past medical history, allergies, medications, health status, including review of consultants reports, laboratory and other test data, was performed as  part of comprehensive evaluation and provision of chronic care management services.   SDOH:  (Social Determinants of Health) assessments and interventions performed: Yes  SDOH Screenings   Alcohol Screen: Low Risk    Last Alcohol Screening Score (AUDIT): 0  Depression (PHQ2-9): Low Risk    PHQ-2 Score: 0  Financial Resource Strain: Low Risk    Difficulty of Paying Living Expenses: Not hard at all  Food Insecurity: No Food Insecurity   Worried About Charity fundraiser in the Last Year: Never true   Ran Out of Food in the Last Year: Never true  Housing: Low Risk    Last Housing Risk Score: 0  Physical Activity: Insufficiently Active   Days of Exercise per Week: 3 days   Minutes of Exercise per Session: 40 min  Social Connections: Socially Isolated   Frequency of Communication with Friends and Family: More than three times a week   Frequency of Social Gatherings with Friends and Family: Never   Attends Religious Services: Never   Marine scientist or Organizations: No   Attends Archivist Meetings: Never   Marital Status: Widowed  Stress: No Stress Concern Present   Feeling of Stress : Only a little  Tobacco Use: Low Risk    Smoking Tobacco Use: Never   Smokeless Tobacco Use: Never   Passive Exposure: Not on file  Transportation Needs: No  Transportation Needs   Lack of Transportation (Medical): No   Lack of Transportation (Non-Medical): No    CCM Care Plan  Allergies  Allergen Reactions   Codeine Nausea And Vomiting and Other (See Comments)    Pt states that she is able to take on a short term basis.     Hydrocodone Nausea And Vomiting and Other (See Comments)    Pt states that she is able to take on a short term basis.     Hydrocodone-Acetaminophen     Other reaction(s): Unknown   Lovastatin Itching and Other (See Comments)    Reaction:  Leg cramps    Pantoprazole     ?abd pain   Pepcid [Famotidine]     ?abd pain   Telmisartan     The patient thinks that telmisartan is making her sneeze   Tramadol Hcl Itching   Trazodone And Nefazodone     Nightmares    Medications Reviewed Today     Reviewed by Agnes Lawrence, CMA (Certified Medical Assistant) on 12/27/20 at [phone]  Med List Status: <None>   Medication Order Taking? Sig Documenting Provider Last Dose Status Informant  acetaminophen (TYLENOL) 500 MG tablet [id number] Yes Take 500-1,000 mg by mouth every 6 (six) hours as needed for mild pain, moderate pain, fever or headache. [provider] Taking Active Self  amLODipine (NORVASC) 10 MG tablet [id number] Yes TAKE 1 TABLET(10 MG) BY MOUTH DAILY Plotnikov, Evie Lacks, MD Taking Active   aspirin (ASPIRIN CHILDRENS) 81 MG chewable tablet [id number] Yes Chew 1 tablet (81 mg total) by mouth daily. Plotnikov, Evie Lacks, MD Taking Active Self  Blood Glucose Monitoring Suppl (ONE TOUCH ULTRA 2) w/Device KIT [id number] Yes Use to check blood sugars daily Plotnikov, Evie Lacks, MD Taking Active   brimonidine (ALPHAGAN) 0.2 % ophthalmic solution [id number] Yes Place 1 drop into both eyes in the morning and at bedtime. [provider] Taking Active   carvedilol (COREG) 25 MG tablet [id number] Yes TAKE 1 TABLET(25 MG) BY MOUTH TWICE DAILY WITH A MEAL Plotnikov, Evie Lacks, MD Taking Active  cholecalciferol (VITAMIN  D) 1000 units tablet [id number] Yes Take 1,000 Units by mouth daily. [provider] Taking Active Self  diclofenac Sodium (VOLTAREN) 1 % GEL [id number] Yes Apply 2 g topically 4 (four) times daily. [provider] Taking Active   folic acid (FOLVITE) 1 MG tablet [id number] Yes Take 1 mg by mouth daily. [provider] Taking Active   furosemide (LASIX) 20 MG tablet [id number] Yes TAKE 1 TO 2 TABLETS(20 TO 40 MG) BY MOUTH DAILY AS NEEDED FOR SWELLING  Patient taking differently: Take 20 mg by mouth daily as needed for edema or fluid. Takes at least twice weekly   Plotnikov, Evie Lacks, MD Taking Active   glucose blood (ONETOUCH ULTRA) test strip [id number] Yes TEST BLOOD SUGAR LEVELS DAILY Plotnikov, Evie Lacks, MD Taking Active   ketotifen (ZADITOR) 0.025 % ophthalmic solution [id number] Yes Place 1 drop into both eyes 2 (two) times daily. [provider] Taking Active   Lancets Glory Rosebush DELICA PLUS PZWCHE52D) [place] [id number] Yes USE TO TEST BLOOD SUGAR LEVELS Plotnikov, Evie Lacks, MD Taking Active   latanoprost (XALATAN) 0.005 % ophthalmic solution [id number] Yes Place 1 drop into both eyes at bedtime. [provider] Taking Active   levothyroxine (SYNTHROID) 25 MCG tablet [id number] Yes TAKE 1 TABLET BY MOUTH ONCE DAILY BEFORE BREAKFAST Plotnikov, Evie Lacks, MD Taking Active   Lidocaine 4 % PTCH [id number] Yes Apply 1 patch topically daily. 12 hours on and 12 hours off [provider] Taking Active   meclizine (ANTIVERT) 25 MG tablet [id number] Yes TAKE 1 TABLET BY MOUTH THREE TIMES DAILY AS NEEDED FOR DIZZINESS OR NAUSEA Plotnikov, Evie Lacks, MD Taking Active   metFORMIN (GLUCOPHAGE) 500 MG tablet [id number] Yes Take 1 tablet (500 mg total) by mouth 2 (two) times daily with a meal. Plotnikov, Evie Lacks, MD Taking Active   methotrexate 2.5 MG tablet [id number] Yes Take 20 mg by mouth once a week. [provider] Taking Active   Multiple  Vitamins-Minerals (PRESERVISION AREDS 2+MULTI VIT PO) [id number] Yes Take 1 capsule by mouth in the morning and at bedtime. [provider] Taking Active   Polyethyl Glycol-Propyl Glycol (SYSTANE OP) [id number] Yes Apply 1 drop to eye as needed. [provider] Taking Active   Polyethylene Glycol 3350 (MIRALAX PO) [id number] Yes Take by mouth. [provider] Taking Active   zolpidem (AMBIEN) 10 MG tablet [id number] Yes Take 10 mg at hs prn. Can repeat 5-10 mg in 4 hrs if needed. Plotnikov, Evie Lacks, MD Taking Active             Patient Active Problem List   Diagnosis Date Noted   CRF (chronic renal failure), stage 3 (moderate) ([employer]) 02/25/2020   Nightmares 04/22/2019   Exposure to COVID-19 virus 10/09/2018   Cough 10/09/2018   Grief 11/23/2016   Unilateral primary osteoarthritis, right knee 08/23/2016   Hypokalemia 05/16/2016   Rhonchi 02/09/2016   Intertrigo 10/04/2015   Allergic rhinitis 04/05/2015   Well adult exam 06/28/2014   Tinea pedis 10/13/2013   Near syncope 09/28/2013   LBP (low back pain) 09/24/2012   Diabetes type 2, controlled ([place]) 11/06/2011   Breast cancer ([place]) 11/06/2011   URI, acute 03/02/2011   Flank pain 03/02/2011   Hyperglycemia 03/02/2011   Edema 10/30/2010   Dyspnea and respiratory abnormalities 10/30/2010   Cramp in limb 10/30/2010   Bronchitis 10/06/2010   NAUSEA ALONE 03/23/2010   RENAL CYST, LEFT 02/23/2010   Chest pain 02/23/2010   Dyslipidemia  07/26/2009   ABDOMINAL PAIN-EPIGASTRIC 03/24/2009   HIP PAIN 07/20/2008   BACK PAIN 07/20/2008   FEVER, NOS 07/20/2008   PARESTHESIA 04/06/2008   Dysuria 04/06/2008   Dysphagia, pharyngoesophageal phase 02/05/2008   ABDOMINAL PAIN, GENERALIZED 02/05/2008   HYPOKALEMIA 11/25/2007   Depression 11/25/2007   COLONIC POLYPS, ADENOMATOUS, HX OF 10/01/2007   VISION IMPAIRMENT, LOW VISION, ONE EYE-LEFT 07/25/2007   KELOID 07/25/2007   MURMUR 07/25/2007   HEMORRHOIDS, INTERNAL  05/10/2007   CONSTIPATION, CHRONIC 05/10/2007   BREAST CANCER, HX OF 04/16/2007   ACUTE BRONCHITIS 02/15/2007   Hypothyroidism 12/26/2006   INSOMNIA, PERSISTENT 12/26/2006   Essential hypertension 12/26/2006   Chronic fatigue 12/26/2006   GERD 11/19/2006   IBS 11/19/2006   FATTY LIVER DISEASE 11/19/2006   Rheumatoid arthritis ([place]) 11/19/2006   Fibromyalgia 11/19/2006    Immunization History  Administered Date(s) Administered   Fluad Quad(high Dose 65+) 09/08/2018, 11/05/2019, 10/11/2020   Influenza Split 10/30/2010, 01/16/2012   Influenza Whole 03/03/2009   Influenza, High Dose Seasonal PF 12/06/2014, 10/04/2015, 11/23/2016, 10/11/2017   Influenza,inj,Quad PF,6+ Mos 09/24/2012, 01/04/2014   PFIZER(Purple Top)SARS-COV-2 Vaccination 03/19/2019, 04/13/2019, 10/20/2019   PNEUMOCOCCAL CONJUGATE-20 08/17/2020   Pneumococcal Conjugate-13 04/05/2014   Pneumococcal Polysaccharide-23 12/06/2014   Td 04/05/2014    Conditions to be addressed/monitored:  Hypertension, Hyperlipidemia, Diabetes, Chronic Kidney Disease, Hypothyroidism, Rheumatoid Arthritis and Insomnia  Care Plan : CCM Care Plan  Updates made by Tomasa Blase, RPH since 01/11/2021 12:00 AM     Problem: Hypertension, Hyperlipidemia, Diabetes, Chronic Kidney Disease, Hypothyroidism, Rheumatoid Arthritis and Insomnia   Priority: High  Onset Date: 10/11/2020     Long-Range Goal: Disease Management   Start Date: 10/11/2020  Expected End Date: 04/11/2021  This Visit's Progress: On track  Recent Progress: On track  Priority: High  Note:    Current Barriers:  Unable to independently monitor therapeutic efficacy  Pharmacist Clinical Goal(s):  Patient will achieve adherence to monitoring guidelines and medication adherence to achieve therapeutic efficacy maintain control of blood pressure and blood sugar as evidenced by BP and BG logs  through collaboration with PharmD and provider.   Interventions: 1:1 collaboration  with Plotnikov, Evie Lacks, MD regarding development and update of comprehensive plan of care as evidenced by provider attestation and co-signature Inter-disciplinary care team collaboration (see longitudinal plan of care) Comprehensive medication review performed; medication list updated in electronic medical record  Hypertension (BP goal <140/90) -Controlled -Current treatment: Carvedilol 74m - 1 tablet twice daily  Amlodipine 183m- 1 tablet daily  Furosemide 2057m 1-2 tablets daily as needed - taking 1 tablet daily as needed - usually twice weekly  -Medications previously tried: losartan, telmisartan, valsartan,   -Current home readings: 116/60's -Current dietary habits: reports that she eats a low sodium, usually drinking 2-3 cups of coffee weekly -Current exercise habits: 3 times weekly - exercise bike 30 minutes -Denies hypotensive/hypertensive symptoms -Educated on BP goals and benefits of medications for prevention of heart attack, stroke and kidney damage; Daily salt intake goal < 2300 mg; Exercise goal of 150 minutes per week; Importance of home blood pressure monitoring; Proper BP monitoring technique; Symptoms of hypotension and importance of maintaining adequate hydration; -Counseled to monitor BP at home 3 times weekly, document, and provide log at future appointments -Counseled on diet and exercise extensively Recommended to continue current medication  Hyperlipidemia: (LDL goal < 70) -Uncontrolled Lab Results  Component Value Date   LDLCALC 114 (H) 08/17/2020  -Current treatment: N/a - patient noted statin  intolerance -Medications previously tried: lovastatin  -Current dietary patterns: reports that her diet can be elevated in fried foods at times -Current exercise habits: using exercise bike 3 times weekly for 30 minutes at a time  -Educated on Cholesterol goals;  Benefits of statin for ASCVD risk reduction; Importance of limiting foods high in  cholesterol; Exercise goal of 150 minutes per week; -Counseled on diet and exercise extensively Recommended trial of non-statin medication (zetia, PCSK9, nexlizet) - patient was not interested in starting a non-statin medication for her cholesterol, preferred to trial dietary changes first  - LDL has not been rechecked since patient requested trial of dietary changes   Diabetes (A1c goal <7%) -Controlled Lab Results  Component Value Date   HGBA1C 6.0 11/17/2020  -Current medications: Metformin 562m - 1 tablet twice daily  -Medications previously tried: n/a  -Current home glucose readings fasting glucose: 107-116 - seldom >120 -Denies hypoglycemic/hyperglycemic symptoms -Current meal patterns:  breakfast: almond milk with cereal, oatmeal w/ raisin + cinnamon  lunch: sandwich, ham/chicken/turkey cheese, salad on occasion   dinner: protein, rice, vegetables snacks: fruit drinks: water, tea with a small amount of sugar -Current exercise: using exercise bike 3 times weekly for 30 minutes at a time  -Educated on A1c and blood sugar goals; Complications of diabetes including kidney damage, retinal damage, and cardiovascular disease; Exercise goal of 150 minutes per week; Prevention and management of hypoglycemic episodes; Benefits of routine self-monitoring of blood sugar; Carbohydrate counting and/or plate method -Counseled to check feet daily and get yearly eye exams -Counseled on diet and exercise extensively Recommended to continue current medication  Hypothyroidism (Goal: Maintenance of euthyroid levels) -Controlled Lab Results  Component Value Date   TSH 1.64 08/17/2020  -Current treatment  Levothyroxine 260m - 1 tablet daily  -Medications previously tried: n/a  -Counseled on diet and exercise extensively Recommended to continue current medication  Rheumatoid Arthritis/ Fibromyalgia (Goal: Prevention of disease progression / pain control) -Not ideally  controlled -Current treatment  Methotrexate 2.65m44m 53m62VOce a week Folic acid 1mg665m1 tablet daily Acetaminophen 500mg36m-2 tablets every 6 hours as needed  Lyrica 265mg 865m2 capsules twice daily as needed  - taking only when shoulder pain flares  -Medications previously tried: hydrocodone, naproxen, tramadol, percocet, cyclobenzaprine, methocarbamol,  Patient has been unable to trial lidocaine patches / restart voltaren gel - plans to in near future - no additional recommendations / changes - patient to follow up with pain management provider prior to next appointment   Glaucoma (Goal: Control of Occular pressure) - Follows with Dr. Groat Katy Fitchrolled -Current treatment  Brimonidine 0.2% ophthalmic solution - 1 drop into both eyes twice daily Latanoprost 0.005% - 1 drop into both eyes at bedtime  Systane eye drops - 1 drop into both eyes as needed  Ketotifen 0.025% ophthalmic solution - 1 drop into both eyes twice daily  -Medications previously tried: n/a  -Recommended to continue current medication  Insomnia (Goal: Promotion of quality sleep) - Controlled - Improved  -Current treatment  Zolpidem 10mg -19m-1 tablet at bedtime - repeat 1/2-1 tablet 4 hours later if needed -Reports to taking 1/2 tablet prior to bedtime, if she wakes up takes an additional 1/2 tablet  - sleeping has improved since last visit  -Medications previously tried: temazepam, trazodone,   -Recommended for patient to continue current medication  Chronic Kidney Disease (Goal: Prevention of disease progression) -Controlled - stable  -Last eGFR - 54.24 mL/min -Last Scr - 0.98 mg/dL -  Current treatment  Avoidance of nephrotoxic agents / adequate blood pressure and blood sugar control to prevent kidney damage -Medications previously tried: n/a  -Recommended to continue current medication  Health Maintenance -Vaccine gaps: advised for patient to receiving shingles vaccine / COVID booster- flu shot completed  today -Current therapy:  Meclizine 80m - 1 tablet 3 times daily as needed  Miralax - 17g daily as needed  Vitamin D3 1000 units - 1 tablet daily  Preservision AERDS 2 - 1 capsule twice daily  -Educated on Cost vs benefit of each product must be carefully weighed by individual consumer -Patient is satisfied with current therapy and denies issues -Recommended to continue current medication  Patient Goals/Self-Care Activities Patient will:  - take medications as prescribed check glucose 3 times weekly, document, and provide at future appointments check blood pressure 3 times weekly, document, and provide at future appointments target a minimum of 150 minutes of moderate intensity exercise weekly engage in dietary modifications by reducing high cholesterol food intake / moderation of carbs   Follow Up Plan: Telephone follow up appointment with care management team member scheduled for: 3 months  The patient has been provided with contact information for the care management team and has been advised to call with any health related questions or concerns.      Medication Assistance: None required.  Patient affirms current coverage meets needs.  Patient's preferred pharmacy is:  [phone] [id number]: [phone]: [phone] Uses pill box? No - feels she is able to manage without use of pill box at this time Pt endorses 90-100% compliance  Care Plan and Follow Up Patient Decision:  Patient agrees to Care Plan and Follow-up.  Plan: Telephone follow up appointment with care management team member scheduled for:  3 months and The patient has been provided with contact information for the care management team and has been advised to call with any health related questions or concerns.   DTomasa Blase PharmD Clinical Pharmacist, LDobbinsscreening examination/treatment/procedure(s) were performed by non-physician practitioner and as supervising physician I was immediately available for consultation/collaboration.  I agree with above. ALew Dawes MD

## 2021-01-11 NOTE — Patient Instructions (Signed)
Visit Information  Following are the goals we discussed today:    Track and Manage My Blood Pressure   Timeframe:  Long-Range Goal   - check blood pressure 3 times per week - choose a place to take my blood pressure (home, clinic or office, retail store) - write blood pressure results in a log or diary    Why is this important?   You won't feel high blood pressure, but it can still hurt your blood vessels.  High blood pressure can cause heart or kidney problems. It can also cause a stroke.  Making lifestyle changes like losing a little weight or eating less salt will help.  Checking your blood pressure at home and at different times of the day can help to control blood pressure.  If the doctor prescribes medicine remember to take it the way the doctor ordered.  Call the office if you cannot afford the medicine or if there are questions about it.   Track and Manage My Blood Sugars   Timeframe:  Long-Range Goal Priority:  High Start Date:  10/11/2020                          Expected End Date:  01/11/2022                      Follow Up Date 04/11/2021   - check blood sugar at prescribed times - check blood sugar if I feel it is too high or too low - enter blood sugar readings and medication or insulin into daily log - take the blood sugar log to all doctor visits - take the blood sugar meter to all doctor visits    Why is this important?   Checking your blood sugar at home helps to keep it from getting very high or very low.  Writing the results in a diary or log helps the doctor know how to care for you.  Your blood sugar log should have the time, date and the results.  Also, write down the amount of insulin or other medicine that you take.  Other information, like what you ate, exercise done and how you were feeling, will also be helpful.    Plan: Telephone follow up appointment with care management team member scheduled for:  3 months  The patient has been provided with  contact information for the care management team and has been advised to call with any health related questions or concerns.   Tomasa Blase, PharmD Clinical Pharmacist, [facility]   Please call the care guide team at [phone] if you need to cancel or reschedule your appointment.   Patient verbalizes understanding of instructions provided today and agrees to view in [place].

## 2021-01-13 ENCOUNTER — Ambulatory Visit: Payer: PPO | Admitting: Nurse Practitioner

## 2021-01-27 ENCOUNTER — Other Ambulatory Visit: Payer: Self-pay | Admitting: Internal Medicine

## 2021-01-27 DIAGNOSIS — Z1231 Encounter for screening mammogram for malignant neoplasm of breast: Secondary | ICD-10-CM

## 2021-02-01 DIAGNOSIS — H1045 Other chronic allergic conjunctivitis: Secondary | ICD-10-CM | POA: Diagnosis not present

## 2021-02-01 DIAGNOSIS — H16223 Keratoconjunctivitis sicca, not specified as Sjogren's, bilateral: Secondary | ICD-10-CM | POA: Diagnosis not present

## 2021-02-01 DIAGNOSIS — H209 Unspecified iridocyclitis: Secondary | ICD-10-CM | POA: Diagnosis not present

## 2021-02-01 DIAGNOSIS — H0288A Meibomian gland dysfunction right eye, upper and lower eyelids: Secondary | ICD-10-CM | POA: Diagnosis not present

## 2021-02-01 DIAGNOSIS — H0288B Meibomian gland dysfunction left eye, upper and lower eyelids: Secondary | ICD-10-CM | POA: Diagnosis not present

## 2021-02-07 DIAGNOSIS — E119 Type 2 diabetes mellitus without complications: Secondary | ICD-10-CM

## 2021-02-07 DIAGNOSIS — I10 Essential (primary) hypertension: Secondary | ICD-10-CM

## 2021-02-07 DIAGNOSIS — E785 Hyperlipidemia, unspecified: Secondary | ICD-10-CM

## 2021-02-07 DIAGNOSIS — E039 Hypothyroidism, unspecified: Secondary | ICD-10-CM

## 2021-02-19 ENCOUNTER — Other Ambulatory Visit: Payer: Self-pay | Admitting: Internal Medicine

## 2021-02-20 ENCOUNTER — Ambulatory Visit: Payer: PPO | Admitting: Internal Medicine

## 2021-02-23 ENCOUNTER — Ambulatory Visit (INDEPENDENT_AMBULATORY_CARE_PROVIDER_SITE_OTHER): Payer: PPO | Admitting: Internal Medicine

## 2021-02-23 ENCOUNTER — Other Ambulatory Visit: Payer: Self-pay

## 2021-02-23 ENCOUNTER — Ambulatory Visit: Payer: PPO

## 2021-02-23 ENCOUNTER — Ambulatory Visit (INDEPENDENT_AMBULATORY_CARE_PROVIDER_SITE_OTHER): Payer: PPO

## 2021-02-23 ENCOUNTER — Encounter: Payer: Self-pay | Admitting: Internal Medicine

## 2021-02-23 VITALS — BP 140/80 | HR 60 | Temp 98.2°F | Ht 63.0 in | Wt 179.0 lb

## 2021-02-23 DIAGNOSIS — K219 Gastro-esophageal reflux disease without esophagitis: Secondary | ICD-10-CM

## 2021-02-23 DIAGNOSIS — F4321 Adjustment disorder with depressed mood: Secondary | ICD-10-CM | POA: Diagnosis not present

## 2021-02-23 DIAGNOSIS — R053 Chronic cough: Secondary | ICD-10-CM

## 2021-02-23 DIAGNOSIS — G4700 Insomnia, unspecified: Secondary | ICD-10-CM | POA: Diagnosis not present

## 2021-02-23 DIAGNOSIS — F515 Nightmare disorder: Secondary | ICD-10-CM

## 2021-02-23 DIAGNOSIS — R634 Abnormal weight loss: Secondary | ICD-10-CM

## 2021-02-23 DIAGNOSIS — R059 Cough, unspecified: Secondary | ICD-10-CM | POA: Diagnosis not present

## 2021-02-23 LAB — CBC WITH DIFFERENTIAL/PLATELET
Basophils Absolute: 0.1 10*3/uL (ref 0.0–0.1)
Basophils Relative: 0.9 % (ref 0.0–3.0)
Eosinophils Absolute: 0.2 10*3/uL (ref 0.0–0.7)
Eosinophils Relative: 3.9 % (ref 0.0–5.0)
HCT: 36.1 % (ref 36.0–46.0)
Hemoglobin: 11.6 g/dL — ABNORMAL LOW (ref 12.0–15.0)
Lymphocytes Relative: 28.9 % (ref 12.0–46.0)
Lymphs Abs: 1.7 10*3/uL (ref 0.7–4.0)
MCHC: 32.2 g/dL (ref 30.0–36.0)
MCV: 93.9 fl (ref 78.0–100.0)
Monocytes Absolute: 0.7 10*3/uL (ref 0.1–1.0)
Monocytes Relative: 11 % (ref 3.0–12.0)
Neutro Abs: 3.3 10*3/uL (ref 1.4–7.7)
Neutrophils Relative %: 55.3 % (ref 43.0–77.0)
Platelets: 214 10*3/uL (ref 150.0–400.0)
RBC: 3.84 Mil/uL — ABNORMAL LOW (ref 3.87–5.11)
RDW: 14.7 % (ref 11.5–15.5)
WBC: 6 10*3/uL (ref 4.0–10.5)

## 2021-02-23 LAB — COMPREHENSIVE METABOLIC PANEL
ALT: 12 U/L (ref 0–35)
AST: 21 U/L (ref 0–37)
Albumin: 4.4 g/dL (ref 3.5–5.2)
Alkaline Phosphatase: 76 U/L (ref 39–117)
BUN: 13 mg/dL (ref 6–23)
CO2: 34 mEq/L — ABNORMAL HIGH (ref 19–32)
Calcium: 10.2 mg/dL (ref 8.4–10.5)
Chloride: 101 mEq/L (ref 96–112)
Creatinine, Ser: 0.85 mg/dL (ref 0.40–1.20)
GFR: 64.22 mL/min (ref >60.00)
Glucose, Bld: 83 mg/dL (ref 70–99)
Potassium: 3.3 mEq/L — ABNORMAL LOW (ref 3.5–5.1)
Sodium: 139 mEq/L (ref 135–145)
Total Bilirubin: 0.5 mg/dL (ref 0.2–1.2)
Total Protein: 7.1 g/dL (ref 6.0–8.3)

## 2021-02-23 LAB — TSH: TSH: 1.81 u[IU]/mL (ref 0.35–5.50)

## 2021-02-23 MED ORDER — TEMAZEPAM 15 MG PO CAPS
15.0000 mg | ORAL_CAPSULE | Freq: Every evening | ORAL | 3 refills | Status: DC | PRN
Start: 2021-02-23 — End: 2021-04-15

## 2021-02-23 MED ORDER — FAMOTIDINE 40 MG PO TABS: 40.0000 mg | ORAL_TABLET | Freq: Every day | ORAL | 3 refills | Status: DC

## 2021-02-23 NOTE — Assessment & Plan Note (Signed)
Worse Start Pepcid po 

## 2021-02-23 NOTE — Progress Notes (Signed)
Subjective:  Patient ID: Deanna Salazar, female    DOB: 10/30/1940  Age: 81 y.o. MRN: [id number]  CC: No chief complaint on file.   HPI Deanna Salazar presents for post-COVID 19 burning in the chest and cough (pt was sick in Nov 2022). F/u on HTN,hypothyroidism C/o insomnia - Zolpidem works for 4 hrs only C/o wt loss  Outpatient Medications Prior to Visit  Medication Sig Dispense Refill   acetaminophen (TYLENOL) 500 MG tablet Take 500-1,000 mg by mouth every 6 (six) hours as needed for mild pain, moderate pain, fever or headache.     amLODipine (NORVASC) 10 MG tablet TAKE 1 TABLET(10 MG) BY MOUTH DAILY 90 tablet 3   aspirin (ASPIRIN CHILDRENS) 81 MG chewable tablet Chew 1 tablet (81 mg total) by mouth daily. 36 tablet 11   Blood Glucose Monitoring Suppl (ONE TOUCH ULTRA 2) w/Device KIT Use to check blood sugars daily 1 kit 0   brimonidine (ALPHAGAN) 0.2 % ophthalmic solution Place 1 drop into both eyes in the morning and at bedtime.     carvedilol (COREG) 25 MG tablet TAKE 1 TABLET(25 MG) BY MOUTH TWICE DAILY WITH A MEAL 180 tablet 1   cholecalciferol (VITAMIN D) 1000 units tablet Take 1,000 Units by mouth daily.     diclofenac Sodium (VOLTAREN) 1 % GEL Apply 2 g topically 4 (four) times daily.     folic acid (FOLVITE) 1 MG tablet Take 1 mg by mouth daily.     furosemide (LASIX) 20 MG tablet TAKE 1 TO 2 TABLETS(20 TO 40 MG) BY MOUTH DAILY AS NEEDED FOR SWELLING (Patient taking differently: Take 20 mg by mouth daily as needed for edema or fluid. Takes at least twice weekly) 60 tablet 5   glucose blood (ONETOUCH ULTRA) test strip TEST BLOOD SUGAR LEVELS DAILY 100 strip 3   ketotifen (ZADITOR) 0.025 % ophthalmic solution Place 1 drop into both eyes 2 (two) times daily.     Lancets (ONETOUCH DELICA PLUS [id number]) MISC USE TO TEST BLOOD SUGAR LEVELS 100 each 3   latanoprost (XALATAN) 0.005 % ophthalmic solution Place 1 drop into both eyes at bedtime.  3   levothyroxine  (SYNTHROID) 25 MCG tablet TAKE 1 TABLET BY MOUTH ONCE DAILY BEFORE BREAKFAST 90 tablet 2   Lidocaine 4 % PTCH Apply 1 patch topically daily. 12 hours on and 12 hours off     meclizine (ANTIVERT) 25 MG tablet TAKE 1 TABLET BY MOUTH THREE TIMES DAILY AS NEEDED FOR DIZZINESS OR NAUSEA 30 tablet 3   metFORMIN (GLUCOPHAGE) 500 MG tablet TAKE 1 TABLET(500 MG) BY MOUTH TWICE DAILY WITH A MEAL 180 tablet 3   methotrexate 2.5 MG tablet Take 20 mg by mouth once a week.     Multiple Vitamins-Minerals (PRESERVISION AREDS 2+MULTI VIT PO) Take 1 capsule by mouth in the morning and at bedtime.     Polyethyl Glycol-Propyl Glycol (SYSTANE OP) Apply 1 drop to eye as needed.     Polyethylene Glycol 3350 (MIRALAX PO) Take by mouth.     promethazine-dextromethorphan (PROMETHAZINE-DM) 6.25-15 MG/5ML syrup Take 5 mLs by mouth 4 (four) times daily as needed for cough. 240 mL 0   zolpidem (AMBIEN) 10 MG tablet Take 10 mg at hs prn. Can repeat 5-10 mg in 4 hrs if needed. 60 tablet 5   No facility-administered medications prior to visit.    ROS: Review of Systems  Constitutional:  Positive for fatigue. Negative for activity change, appetite change, chills and unexpected  weight change.  HENT:  Negative for congestion, mouth sores and sinus pressure.   Eyes:  Negative for visual disturbance.  Respiratory:  Positive for cough. Negative for chest tightness and wheezing.   Cardiovascular:  Negative for chest pain and palpitations.  Gastrointestinal:  Negative for abdominal pain and nausea.  Genitourinary:  Negative for difficulty urinating, frequency and vaginal pain.  Musculoskeletal:  Negative for back pain and gait problem.  Skin:  Negative for pallor and rash.  Neurological:  Negative for dizziness, tremors, weakness, numbness and headaches.  Psychiatric/Behavioral:  Positive for sleep disturbance. Negative for confusion. The patient is not nervous/anxious.    Objective:  BP 140/80 (BP Location: Right Arm,  Patient Position: Sitting, Cuff Size: Large)    Pulse 60    Temp 98.2 F (36.8 C) (Oral)    Ht 5' 3"  (1.6 m)    Wt 179 lb (81.2 kg)    SpO2 97%    BMI 31.71 kg/m   BP Readings from Last 3 Encounters:  02/23/21 140/80  11/17/20 130/62  08/17/20 138/70    Wt Readings from Last 3 Encounters:  02/23/21 179 lb (81.2 kg)  01/04/21 186 lb (84.4 kg)  11/17/20 186 lb 12.8 oz (84.7 kg)    Physical Exam Constitutional:      General: She is not in acute distress.    Appearance: She is well-developed.  HENT:     Head: Normocephalic.     Right Ear: External ear normal.     Left Ear: External ear normal.     Nose: Nose normal.  Eyes:     General:        Right eye: No discharge.        Left eye: No discharge.     Conjunctiva/sclera: Conjunctivae normal.     Pupils: Pupils are equal, round, and reactive to light.  Neck:     Thyroid: No thyromegaly.     Vascular: No JVD.     Trachea: No tracheal deviation.  Cardiovascular:     Rate and Rhythm: Normal rate and regular rhythm.     Heart sounds: Normal heart sounds.  Pulmonary:     Effort: No respiratory distress.     Breath sounds: No stridor. No wheezing.  Abdominal:     General: Bowel sounds are normal. There is no distension.     Palpations: Abdomen is soft. There is no mass.     Tenderness: There is no abdominal tenderness. There is no guarding or rebound.  Musculoskeletal:        General: No tenderness.     Cervical back: Normal range of motion and neck supple. No rigidity.  Lymphadenopathy:     Cervical: No cervical adenopathy.  Skin:    Findings: No erythema or rash.  Neurological:     Cranial Nerves: No cranial nerve deficit.     Motor: No abnormal muscle tone.     Coordination: Coordination normal.     Deep Tendon Reflexes: Reflexes normal.  Psychiatric:        Behavior: Behavior normal.        Thought Content: Thought content normal.        Judgment: Judgment normal.    Lab Results  Component Value Date   WBC  6.0 08/17/2020   HGB 11.4 (L) 08/17/2020   HCT 35.4 (L) 08/17/2020   PLT 208.0 08/17/2020   GLUCOSE 100 (H) 11/17/2020   CHOL 187 08/17/2020   TRIG 132.0 08/17/2020   HDL 46.00 08/17/2020  LDLDIRECT 180.9 07/26/2009   LDLCALC 114 (H) 08/17/2020   ALT 11 11/17/2020   AST 19 11/17/2020   NA 140 11/17/2020   K 4.2 11/17/2020   CL 103 11/17/2020   CREATININE 0.98 11/17/2020   BUN 15 11/17/2020   CO2 32 11/17/2020   TSH 1.64 08/17/2020   HGBA1C 6.0 11/17/2020   MICROALBUR <0.7 02/23/2020    MM 3D SCREEN BREAST BILATERAL  Result Date: 04/05/2020 CLINICAL DATA:  Screening. EXAM: DIGITAL SCREENING BILATERAL MAMMOGRAM WITH TOMOSYNTHESIS AND CAD TECHNIQUE: Bilateral screening digital craniocaudal and mediolateral oblique mammograms were obtained. Bilateral screening digital breast tomosynthesis was performed. The images were evaluated with computer-aided detection. COMPARISON:  Previous exam(s). ACR Breast Density Category b: There are scattered areas of fibroglandular density. FINDINGS: There are no findings suspicious for malignancy. The images were evaluated with computer-aided detection. IMPRESSION: No mammographic evidence of malignancy. A result letter of this screening mammogram will be mailed directly to the patient. RECOMMENDATION: Screening mammogram in one year. (Code:SM-B-01Y) BI-RADS CATEGORY  1: Negative. Electronically Signed   By: Kristopher Oppenheim M.D.   On: 04/05/2020 10:06    Assessment & Plan:   Problem List Items Addressed This Visit     Cough - Primary   Relevant Orders   DG Chest 2 View   TSH   CBC with Differential/Platelet   Comprehensive metabolic panel   GERD    Worse Start Pepcid po      Relevant Medications   famotidine (PEPCID) 40 MG tablet   Grief    husband died in 12-27-2016      INSOMNIA, PERSISTENT    D/c Zolpidem Start Temazepam prn 15-30 mg qhs  Potential benefits of a long term benzodiazepines  use as well as potential risks  and  complications were explained to the patient and were aknowledged.       Nightmares     PTSD. Re-start Temazepam.   Potential benefits of a long term benzodiazepines  use as well as potential risks  and complications were explained to the patient and were aknowledged.       Weight loss    New CXR TSH, CMET RTC 6 wks      Relevant Orders   TSH   CBC with Differential/Platelet   Comprehensive metabolic panel      Meds ordered this encounter  Medications   temazepam (RESTORIL) 15 MG capsule    Sig: Take 1-2 capsules (15-30 mg total) by mouth at bedtime as needed for sleep.    Dispense:  60 capsule    Refill:  3   famotidine (PEPCID) 40 MG tablet    Sig: Take 1 tablet (40 mg total) by mouth daily.    Dispense:  90 tablet    Refill:  3      Follow-up: Return in about 6 weeks (around 04/06/2021) for a follow-up visit.  Walker Kehr, MD

## 2021-02-23 NOTE — Assessment & Plan Note (Signed)
husband died in Nov 2018

## 2021-02-23 NOTE — Assessment & Plan Note (Signed)
New CXR TSH, CMET RTC 6 wks

## 2021-02-23 NOTE — Assessment & Plan Note (Signed)
PTSD. Re-start Temazepam.   Potential benefits of a long term benzodiazepines  use as well as potential risks  and complications were explained to the patient and were aknowledged.

## 2021-02-23 NOTE — Assessment & Plan Note (Signed)
D/c Zolpidem Start Temazepam prn 15-30 mg qhs  Potential benefits of a long term benzodiazepines  use as well as potential risks  and complications were explained to the patient and were aknowledged.

## 2021-02-26 ENCOUNTER — Other Ambulatory Visit: Payer: Self-pay | Admitting: Internal Medicine

## 2021-02-26 MED ORDER — POTASSIUM CHLORIDE CRYS ER 10 MEQ PO TBCR: 10.0000 meq | EXTENDED_RELEASE_TABLET | Freq: Every day | ORAL | 3 refills | Status: DC

## 2021-02-27 ENCOUNTER — Telehealth: Payer: Self-pay | Admitting: Internal Medicine

## 2021-02-27 NOTE — Telephone Encounter (Signed)
Patient calling in  Patient says she can not take the rx potassium chloride (KLOR-CON M) 10 MEQ tablet provider sent to pharmacy  Says she has tried taking it in tha past & it gives her an upset stomach  Wants to know what provider recommends   CB [phone]

## 2021-02-28 NOTE — Telephone Encounter (Signed)
There is no better alternatives.  She can put a tablet and orange juice and let it stay for 10-15 minutes.  It will flake up, then it can be mixed with juice.  It may help.  Thanks

## 2021-03-02 NOTE — Telephone Encounter (Signed)
Spke with pt and was able to inform her of Dr. Judeen Hammans instructions.

## 2021-03-07 ENCOUNTER — Ambulatory Visit (INDEPENDENT_AMBULATORY_CARE_PROVIDER_SITE_OTHER): Payer: PPO

## 2021-03-07 VITALS — BP 133/70

## 2021-03-07 DIAGNOSIS — Z0000 Encounter for general adult medical examination without abnormal findings: Secondary | ICD-10-CM

## 2021-03-07 NOTE — Progress Notes (Addendum)
I connected with Deanna Salazar today by telephone and verified that I am speaking with the correct person using two identifiers. Location patient: home Location provider: work Persons participating in the virtual visit: patient, provider.   I discussed the limitations, risks, security and privacy concerns of performing an evaluation and management service by telephone and the availability of in person appointments. I also discussed with the patient that there may be a patient responsible charge related to this service. The patient expressed understanding and verbally consented to this telephonic visit.    Interactive audio and video telecommunications were attempted between this provider and patient, however failed, due to patient having technical difficulties OR patient did not have access to video capability.  We continued and completed visit with audio only.  Some vital signs may be absent or patient reported.   Time Spent with patient on telephone encounter: 40 minutes  Subjective:   Deanna Salazar is a 81 y.o. female who presents for Medicare Annual (Subsequent) preventive examination.  Review of Systems     Cardiac Risk Factors include: advanced age (>33mn, >>53women);diabetes mellitus;dyslipidemia;hypertension;family history of premature cardiovascular disease     Objective:    Today's Vitals   03/07/21 1130  BP: 133/70   There is no height or weight on file to calculate BMI.  Advanced Directives 03/07/2021 02/23/2020 02/01/2020 09/08/2018 08/23/2017 08/22/2016 08/11/2016  Does Patient Have a Medical Advance Directive? _0  No No  Would patient like information on creating a medical advance directive? No - Patient declined No - Patient declined - No - Patient declined Yes (ED - Information included in AVS) Yes (ED - Information included in AVS) No - Patient declined    Current Medications (verified) Outpatient Encounter Medications as of 03/07/2021  Medication Sig    acetaminophen (TYLENOL) 500 MG tablet Take 500-1,000 mg by mouth every 6 (six) hours as needed for mild pain, moderate pain, fever or headache.   amLODipine (NORVASC) 10 MG tablet TAKE 1 TABLET(10 MG) BY MOUTH DAILY   aspirin (ASPIRIN CHILDRENS) 81 MG chewable tablet Chew 1 tablet (81 mg total) by mouth daily.   Blood Glucose Monitoring Suppl (ONE TOUCH ULTRA 2) w/Device KIT Use to check blood sugars daily   brimonidine (ALPHAGAN) 0.2 % ophthalmic solution Place 1 drop into both eyes in the morning and at bedtime.   carvedilol (COREG) 25 MG tablet TAKE 1 TABLET(25 MG) BY MOUTH TWICE DAILY WITH A MEAL   cholecalciferol (VITAMIN D) 1000 units tablet Take 1,000 Units by mouth daily.   diclofenac Sodium (VOLTAREN) 1 % GEL Apply 2 g topically 4 (four) times daily.   famotidine (PEPCID) 40 MG tablet Take 1 tablet (40 mg total) by mouth daily.   folic acid (FOLVITE) 1 MG tablet Take 1 mg by mouth daily.   furosemide (LASIX) 20 MG tablet TAKE 1 TO 2 TABLETS(20 TO 40 MG) BY MOUTH DAILY AS NEEDED FOR SWELLING (Patient taking differently: Take 20 mg by mouth daily as needed for edema or fluid. Takes at least twice weekly)   glucose blood (ONETOUCH ULTRA) test strip TEST BLOOD SUGAR LEVELS DAILY   ketotifen (ZADITOR) 0.025 % ophthalmic solution Place 1 drop into both eyes 2 (two) times daily.   Lancets (ONETOUCH DELICA PLUS [id number] MISC USE TO TEST BLOOD SUGAR LEVELS   latanoprost (XALATAN) 0.005 % ophthalmic solution Place 1 drop into both eyes at bedtime.   levothyroxine (SYNTHROID) 25 MCG tablet TAKE 1 TABLET BY MOUTH ONCE DAILY  BEFORE BREAKFAST   Lidocaine 4 % PTCH Apply 1 patch topically daily. 12 hours on and 12 hours off   meclizine (ANTIVERT) 25 MG tablet TAKE 1 TABLET BY MOUTH THREE TIMES DAILY AS NEEDED FOR DIZZINESS OR NAUSEA   metFORMIN (GLUCOPHAGE) 500 MG tablet TAKE 1 TABLET(500 MG) BY MOUTH TWICE DAILY WITH A MEAL   methotrexate 2.5 MG tablet Take 20 mg by mouth once a week.   Multiple  Vitamins-Minerals (PRESERVISION AREDS 2+MULTI VIT PO) Take 1 capsule by mouth in the morning and at bedtime.   Polyethyl Glycol-Propyl Glycol (SYSTANE OP) Apply 1 drop to eye as needed.   Polyethylene Glycol 3350 (MIRALAX PO) Take by mouth.   potassium chloride (KLOR-CON M) 10 MEQ tablet Take 1 tablet (10 mEq total) by mouth daily.   temazepam (RESTORIL) 15 MG capsule Take 1-2 capsules (15-30 mg total) by mouth at bedtime as needed for sleep.   No facility-administered encounter medications on file as of 03/07/2021.    Allergies (verified) Codeine, Hydrocodone, Hydrocodone-acetaminophen, Lovastatin, Pantoprazole, Pepcid [famotidine], Telmisartan, Tramadol hcl, and Trazodone and nefazodone   History: Past Medical History:  Diagnosis Date   Abdominal pain, generalized 02/05/2008   ABDOMINAL PAIN-EPIGASTRIC 03/24/2009   Acute bronchitis 02/15/2007   Adenomatous colon polyp 04/2000   Arthritis    ASCUS favor benign 10/2011   negative high risk HPV screen   BACK PAIN 07/20/2008   Breast cancer ([place]) 2009   l, hx of XRT Dr Benay Spice, Left side   BREAST CANCER, HX OF 04/16/2007   CHEST PAIN 02/23/2010   COLONIC POLYPS, ADENOMATOUS, HX OF 10/01/2007   CONSTIPATION, CHRONIC 05/10/2007   Dysuria 04/06/2008   FATIGUE 12/26/2006   Fatty liver    FATTY LIVER DISEASE 11/19/2006   FIBROMYALGIA 11/19/2006   GERD 11/19/2006   Dr Fuller Plan   Glaucoma 07/2017   left eye   HEMATOCHEZIA 05/10/2007   HEMORRHOIDS, INTERNAL 05/10/2007   HIP PAIN 07/20/2008   HYPERLIPIDEMIA 07/26/2009   HYPERTENSION 12/26/2006   HYPOKALEMIA 11/25/2007   HYPOTHYROIDISM 12/26/2006   IBS 11/19/2006   INSOMNIA, PERSISTENT 12/26/2006   KELOID 07/25/2007   MURMUR 07/25/2007   Nausea alone 03/23/2010   PARESTHESIA 04/06/2008   Personal history of radiation therapy    RENAL CYST, LEFT 02/23/2010   Rheumatoid arthritis(714.0) 11/19/2006   Dr Vianne Bulls   RUQ PAIN 11/07/2009   Tubular adenoma    VISION IMPAIRMENT, LOW VISION, ONE EYE-LEFT  07/25/2007   Past Surgical History:  Procedure Laterality Date   BREAST LUMPECTOMY Left 2009   BREAST SURGERY  2009   Left lumpectomy   COLONOSCOPY  06/12/2012   Cyst excision from chest     knee surgery Left    Left thyroidectomy     OS Cataract extraction  2009   OVARIAN CYST REMOVAL  age 61   SHOULDER SURGERY     both shoulders   TOE SURGERY     TUBAL LIGATION     Family History  Problem Relation Age of Onset   Lung cancer Brother    Colon cancer Brother 43       50's   Diabetes Father    Brain cancer Brother    Stroke Sister    Breast cancer Neg Hx    Social History   Socioeconomic History   Marital status: Widowed    Spouse name: Not on file   Number of children: 5   Years of education: Not on file   Highest education level: Not on  file  Occupational History   Occupation: retired    Fish farm manager: RETIRED  Tobacco Use   Smoking status: Never   Smokeless tobacco: Never  Vaping Use   Vaping Use: Never used  Substance and Sexual Activity   Alcohol use: No    Alcohol/week: 0.0 standard drinks   Drug use: No   Sexual activity: Never    Comment: 1st intercourse 81 yo-Fewer than 5 partners  Other Topics Concern   Not on file  Social History Narrative   Patient does not get regular exercise   Social Determinants of Health   Financial Resource Strain: Low Risk    Difficulty of Paying Living Expenses: Not hard at all  Food Insecurity: No Food Insecurity   Worried About Charity fundraiser in the Last Year: Never true   Ran Out of Food in the Last Year: Never true  Transportation Needs: No Transportation Needs   Lack of Transportation (Medical): No   Lack of Transportation (Non-Medical): No  Physical Activity: Sufficiently Active   Days of Exercise per Week: 5 days   Minutes of Exercise per Session: 40 min  Stress: No Stress Concern Present   Feeling of Stress : Not at all  Social Connections: Moderately Integrated   Frequency of Communication with Friends  and Family: More than three times a week   Frequency of Social Gatherings with Friends and Family: More than three times a week   Attends Religious Services: More than 4 times per year   Active Member of Clubs or Organizations: No   Attends Music therapist: More than 4 times per year   Marital Status: Widowed    Tobacco Counseling Counseling given: Not Answered   Clinical Intake:  Pre-visit preparation completed: Yes  Pain : No/denies pain     Nutritional Risks: None Diabetes: Yes CBG done?: No Did pt. bring in CBG monitor from home?: No  How often do you need to have someone help you when you read instructions, pamphlets, or other written materials from your doctor or pharmacy?: 1 - Never What is the last grade level you completed in school?: 9th grade  Diabetic? yes  Interpreter Needed?: No  Information entered by :: Lisette Abu, LPN   Activities of Daily Living In your present state of health, do you have any difficulty performing the following activities: 03/07/2021  Hearing? N  Vision? N  Difficulty concentrating or making decisions? N  Walking or climbing stairs? N  Dressing or bathing? N  Doing errands, shopping? N  Preparing Food and eating ? N  Using the Toilet? N  In the past six months, have you accidently leaked urine? N  Do you have problems with loss of bowel control? N  Managing your Medications? N  Managing your Finances? N  Housekeeping or managing your Housekeeping? N  Some recent data might be hidden    Patient Care Team: Plotnikov, Evie Lacks, MD as PCP - General Hennie Duos, MD (Rheumatology) Newt Minion, MD (Orthopedic Surgery) Rosita Kea, PA-C (Inactive) (Rheumatology) Szabat, Darnelle Maffucci, [facility] as Pharmacist (Pharmacist) [place]ne[place]so, P.A. as Consulting Physician (Ophthalmology)  Indicate any recent Medical Services you may have received from other than [facility] providers in the past year (date may be  approximate).     Assessment:   This is a routine wellness examination for Deanna Salazar.  Hearing/Vision screen Hearing Screening - Comments:: Patient denied any hearing difficulty.   No hearing aids.  Vision Screening - Comments::  Patient wears corrective glasses/contacts.   Eye exam done annually by: Warden Fillers, MD.  Dietary issues and exercise activities discussed: Current Exercise Habits: Home exercise routine, Type of exercise: walking;Other - see comments (stationary bike 4-5 times a week for 30 minutes), Time (Minutes): 30, Frequency (Times/Week): 5, Weekly Exercise (Minutes/Week): 150, Intensity: Moderate, Exercise limited by: orthopedic condition(s)   Goals Addressed             This Visit's Progress    Patient Stated       To continue to be independent, physically and socially active.      Depression Screen PHQ 2/9 Scores 03/07/2021 02/23/2021 11/17/2020 08/17/2020 02/23/2020 12/22/2019 09/08/2018  PHQ - 2 Score 0 0 0 0 0 0 1  PHQ- 9 Score 0 0 0 0 - - -    Fall Risk Fall Risk  03/07/2021 02/23/2021 02/23/2020 12/22/2019 11/10/2018  Falls in the past year? 0 0 0 0 0  Number falls in past yr: 0 - 0 0 -  Injury with Fall? 0 - 0 0 -  Risk for fall due to : No Fall Risks - No Fall Risks - -  Follow up Falls evaluation completed - - - Falls evaluation completed    FALL RISK PREVENTION PERTAINING TO THE HOME:  Any stairs in or around the home? No  If so, are there any without handrails? No  Home free of loose throw rugs in walkways, pet beds, electrical cords, etc? Yes  Adequate lighting in your home to reduce risk of falls? Yes   ASSISTIVE DEVICES UTILIZED TO PREVENT FALLS:  Life alert? No  Use of a cane, walker or w/c? No  Grab bars in the bathroom? Yes  Shower chair or bench in shower? No  Elevated toilet seat or a handicapped toilet? Yes   TIMED UP AND GO:  Was the test performed? No .  Length of time to ambulate 10 feet: n/a sec.   Gait steady and fast  without use of assistive device  Cognitive Function: Normal cognitive status assessed by direct observation by this Nurse Health Advisor. No abnormalities found.   MMSE - Mini Mental State Exam 08/23/2017 08/22/2016  Orientation to time 5 5  Orientation to Place 5 5  Registration 3 3  Attention/ Calculation 5 4  Recall 3 2  Language- name 2 objects 2 2  Language- repeat 1 1  Language- follow 3 step command 3 3  Language- read & follow direction 1 1  Write a sentence 1 1  Copy design 1 1  Total score 30 28     6CIT Screen 09/08/2018  What Year? 0 points  What month? 0 points  What time? 0 points  Count back from 20 0 points  Months in reverse 0 points  Repeat phrase 0 points  Total Score 0    Immunizations Immunization History  Administered Date(s) Administered   Fluad Quad(high Dose 65+) 09/08/2018, 11/05/2019, 10/11/2020   Influenza Split 10/30/2010, 01/16/2012   Influenza Whole 03/03/2009   Influenza, High Dose Seasonal PF 12/06/2014, 10/04/2015, 11/23/2016, 10/11/2017   Influenza,inj,Quad PF,6+ Mos 09/24/2012, 01/04/2014   PFIZER(Purple Top)SARS-COV-2 Vaccination 03/19/2019, 04/13/2019, 10/20/2019   PNEUMOCOCCAL CONJUGATE-20 08/17/2020   Pneumococcal Conjugate-13 04/05/2014   Pneumococcal Polysaccharide-23 12/06/2014   Td 04/05/2014    TDAP status: Up to date  Flu Vaccine status: Up to date  Pneumococcal vaccine status: Up to date  Covid-19 vaccine status: Completed vaccines  Qualifies for Shingles Vaccine? Yes   Zostavax completed  No   Shingrix Completed?: No.    Education has been provided regarding the importance of this vaccine. Patient has been advised to call insurance company to determine out of pocket expense if they have not yet received this vaccine. Advised may also receive vaccine at local pharmacy or Health Dept. Verbalized acceptance and understanding.  Screening Tests Health Maintenance  Topic Date Due   Zoster Vaccines- Shingrix (1 of 2)  Never done   COVID-19 Vaccine (4 - Booster for Pfizer series) 12/15/2019   FOOT EXAM  02/22/2021   URINE MICROALBUMIN  02/22/2021   OPHTHALMOLOGY EXAM  08/23/2021   TETANUS/TDAP  04/04/2024   Pneumonia Vaccine 85+ Years old  Completed   INFLUENZA VACCINE  Completed   DEXA SCAN  Completed   HPV VACCINES  Aged Out    Health Maintenance  Health Maintenance Due  Topic Date Due   Zoster Vaccines- Shingrix (1 of 2) Never done   COVID-19 Vaccine (4 - Booster for Pfizer series) 12/15/2019   FOOT EXAM  02/22/2021   URINE MICROALBUMIN  02/22/2021    Colorectal cancer screening: No longer required.   Mammogram status: Completed 04/01/2020. Repeat every year  Bone Density status: Completed 11/10/2013. Results reflect: Bone density results: NORMAL. Repeat every 5 years.  Lung Cancer Screening: (Low Dose CT Chest recommended if Age 62-80 years, 30 pack-year currently smoking OR have quit w/in 15years.) does not qualify.   Lung Cancer Screening Referral: no  Additional Screening:  Hepatitis C Screening: does not qualify; Completed no  Vision Screening: Recommended annual ophthalmology exams for early detection of glaucoma and other disorders of the eye. Is the patient up to date with their annual eye exam?  Yes  Who is the provider or what is the name of the office in which the patient attends annual eye exams? Warden Fillers, MD. If pt is not established with a provider, would they like to be referred to a provider to establish care? No .   Dental Screening: Recommended annual dental exams for proper oral hygiene  Community Resource Referral / Chronic Care Management: CRR required this visit?  No   CCM required this visit?  No      Plan:     I have personally reviewed and noted the following in the patients chart:   Medical and social history Use of alcohol, tobacco or illicit drugs  Current medications and supplements including opioid prescriptions.  Functional ability  and status Nutritional status Physical activity Advanced directives List of other physicians Hospitalizations, surgeries, and ER visits in previous 12 months Vitals Screenings to include cognitive, depression, and falls Referrals and appointments  In addition, I have reviewed and discussed with patient certain preventive protocols, quality metrics, and best practice recommendations. A written personalized care plan for preventive services as well as general preventive health recommendations were provided to patient.     Sheral Flow, LPN   [id number]/[id number]   Nurse Notes:  Patient is cogitatively intact. There were no vitals filed for this visit. There is no height or weight on file to calculate BMI. Patient stated that she has no issues with gait or balance; does not use any assistive devices. Medications reviewed with patient; no opioid use noted.   Medical screening examination/treatment/procedure(s) were performed by non-physician practitioner and as supervising physician I was immediately available for consultation/collaboration.  I agree with above. Lew Dawes, MD

## 2021-03-07 NOTE — Patient Instructions (Signed)
Deanna Salazar , Thank you for taking time to come for your Medicare Wellness Visit. I appreciate your ongoing commitment to your health goals. Please review the following plan we discussed and let me know if I can assist you in the future.   Screening recommendations/referrals: Colonoscopy: Not a candidate for screening due to age 81: 04/01/2020; due every year (scheduled for 3-27/2023) Bone Density: 11/10/2013; normal results Recommended yearly ophthalmology/optometry visit for glaucoma screening and checkup Recommended yearly dental visit for hygiene and checkup  Vaccinations: Influenza vaccine: 10/11/2020 Pneumococcal vaccine: 04/05/2014, 12/06/2014, 08/17/2020 Tdap vaccine: 04/05/2014; due every 10 years Shingles vaccine: never done   Covid-19: 03/19/2019, 04/13/2019, 10/20/2019  Advanced directives: Advance directive discussed with you today. Even though you declined this today please call our office should you change your mind and we can give you the proper paperwork for you to fill out.  Conditions/risks identified: Yes; Client understands the importance of follow-up appointments with providers by attending scheduled visits and discussed goals to eat healthier, increase physical activity 5 times a week for 30 minutes each, exercise the brain by doing stimulating brain exercises (reading, adult coloring, crafting, listening to music, puzzles, etc.), socialize and enjoy life more, get enough sleep at least 8-9 hours average per night and make time for laughter.  Next appointment: Please schedule your next Medicare Wellness Visit with your Nurse Health Advisor in 1 year by calling [phone].   Preventive Care 74 Years and Older, Female Preventive care refers to lifestyle choices and visits with your health care provider that can promote health and wellness. What does preventive care include? A yearly physical exam. This is also called an annual well check. Dental exams once or twice a  year. Routine eye exams. Ask your health care provider how often you should have your eyes checked. Personal lifestyle choices, including: Daily care of your teeth and gums. Regular physical activity. Eating a healthy diet. Avoiding tobacco and drug use. Limiting alcohol use. Practicing safe sex. Taking low-dose aspirin every day. Taking vitamin and mineral supplements as recommended by your health care provider. What happens during an annual well check? The services and screenings done by your health care provider during your annual well check will depend on your age, overall health, lifestyle risk factors, and family history of disease. Counseling  Your health care provider may ask you questions about your: Alcohol use. Tobacco use. Drug use. Emotional well-being. Home and relationship well-being. Sexual activity. Eating habits. History of falls. Memory and ability to understand (cognition). Work and work Statistician. Reproductive health. Screening  You may have the following tests or measurements: Height, weight, and BMI. Blood pressure. Lipid and cholesterol levels. These may be checked every 5 years, or more frequently if you are over 65 years old. Skin check. Lung cancer screening. You may have this screening every year starting at age 95 if you have a 30-pack-year history of smoking and currently smoke or have quit within the past 15 years. Fecal occult blood test (FOBT) of the stool. You may have this test every year starting at age 74. Flexible sigmoidoscopy or colonoscopy. You may have a sigmoidoscopy every 5 years or a colonoscopy every 10 years starting at age 47. Hepatitis C blood test. Hepatitis B blood test. Sexually transmitted disease (STD) testing. Diabetes screening. This is done by checking your blood sugar (glucose) after you have not eaten for a while (fasting). You may have this done every 1-3 years. Bone density scan. This is done to screen for  osteoporosis. You may have this done starting at age 107. Mammogram. This may be done every 1-2 years. Talk to your health care provider about how often you should have regular mammograms. Talk with your health care provider about your test results, treatment options, and if necessary, the need for more tests. Vaccines  Your health care provider may recommend certain vaccines, such as: Influenza vaccine. This is recommended every year. Tetanus, diphtheria, and acellular pertussis (Tdap, Td) vaccine. You may need a Td booster every 10 years. Zoster vaccine. You may need this after age 70. Pneumococcal 13-valent conjugate (PCV13) vaccine. One dose is recommended after age 55. Pneumococcal polysaccharide (PPSV23) vaccine. One dose is recommended after age 15. Talk to your health care provider about which screenings and vaccines you need and how often you need them. This information is not intended to replace advice given to you by your health care provider. Make sure you discuss any questions you have with your health care provider. Document Released: 01/21/2015 Document Revised: 09/14/2015 Document Reviewed: 10/26/2014 Elsevier Interactive Patient Education  2017 [place] Prevention in the Home Falls can cause injuries. They can happen to people of all ages. There are many things you can do to make your home safe and to help prevent falls. What can I do on the outside of my home? Regularly fix the edges of walkways and driveways and fix any cracks. Remove anything that might make you trip as you walk through a door, such as a raised step or threshold. Trim any bushes or trees on the path to your home. Use bright outdoor lighting. Clear any walking paths of anything that might make someone trip, such as rocks or tools. Regularly check to see if handrails are loose or broken. Make sure that both sides of any steps have handrails. Any raised decks and porches should have guardrails on  the edges. Have any leaves, snow, or ice cleared regularly. Use sand or salt on walking paths during winter. Clean up any spills in your garage right away. This includes oil or grease spills. What can I do in the bathroom? Use night lights. Install grab bars by the toilet and in the tub and shower. Do not use towel bars as grab bars. Use non-skid mats or decals in the tub or shower. If you need to sit down in the shower, use a plastic, non-slip stool. Keep the floor dry. Clean up any water that spills on the floor as soon as it happens. Remove soap buildup in the tub or shower regularly. Attach bath mats securely with double-sided non-slip rug tape. Do not have throw rugs and other things on the floor that can make you trip. What can I do in the bedroom? Use night lights. Make sure that you have a light by your bed that is easy to reach. Do not use any sheets or blankets that are too big for your bed. They should not hang down onto the floor. Have a firm chair that has side arms. You can use this for support while you get dressed. Do not have throw rugs and other things on the floor that can make you trip. What can I do in the kitchen? Clean up any spills right away. Avoid walking on wet floors. Keep items that you use a lot in easy-to-reach places. If you need to reach something above you, use a strong step stool that has a grab bar. Keep electrical cords out of the way. Do not use  floor polish or wax that makes floors slippery. If you must use wax, use non-skid floor wax. Do not have throw rugs and other things on the floor that can make you trip. What can I do with my stairs? Do not leave any items on the stairs. Make sure that there are handrails on both sides of the stairs and use them. Fix handrails that are broken or loose. Make sure that handrails are as long as the stairways. Check any carpeting to make sure that it is firmly attached to the stairs. Fix any carpet that is loose  or worn. Avoid having throw rugs at the top or bottom of the stairs. If you do have throw rugs, attach them to the floor with carpet tape. Make sure that you have a light switch at the top of the stairs and the bottom of the stairs. If you do not have them, ask someone to add them for you. What else can I do to help prevent falls? Wear shoes that: Do not have high heels. Have rubber bottoms. Are comfortable and fit you well. Are closed at the toe. Do not wear sandals. If you use a stepladder: Make sure that it is fully opened. Do not climb a closed stepladder. Make sure that both sides of the stepladder are locked into place. Ask someone to hold it for you, if possible. Clearly mark and make sure that you can see: Any grab bars or handrails. First and last steps. Where the edge of each step is. Use tools that help you move around (mobility aids) if they are needed. These include: Canes. Walkers. Scooters. Crutches. Turn on the lights when you go into a dark area. Replace any light bulbs as soon as they burn out. Set up your furniture so you have a clear path. Avoid moving your furniture around. If any of your floors are uneven, fix them. If there are any pets around you, be aware of where they are. Review your medicines with your doctor. Some medicines can make you feel dizzy. This can increase your chance of falling. Ask your doctor what other things that you can do to help prevent falls. This information is not intended to replace advice given to you by your health care provider. Make sure you discuss any questions you have with your health care provider. Document Released: 10/21/2008 Document Revised: 06/02/2015 Document Reviewed: 01/29/2014 Elsevier Interactive Patient Education  2017 [employer].

## 2021-03-17 DIAGNOSIS — M1711 Unilateral primary osteoarthritis, right knee: Secondary | ICD-10-CM | POA: Diagnosis not present

## 2021-03-17 DIAGNOSIS — E669 Obesity, unspecified: Secondary | ICD-10-CM | POA: Diagnosis not present

## 2021-03-17 DIAGNOSIS — M0609 Rheumatoid arthritis without rheumatoid factor, multiple sites: Secondary | ICD-10-CM | POA: Diagnosis not present

## 2021-03-17 DIAGNOSIS — Z6831 Body mass index (BMI) 31.0-31.9, adult: Secondary | ICD-10-CM | POA: Diagnosis not present

## 2021-03-17 DIAGNOSIS — M25561 Pain in right knee: Secondary | ICD-10-CM | POA: Diagnosis not present

## 2021-03-27 ENCOUNTER — Other Ambulatory Visit: Payer: Self-pay | Admitting: Internal Medicine

## 2021-04-03 ENCOUNTER — Ambulatory Visit
Admission: RE | Admit: 2021-04-03 | Discharge: 2021-04-03 | Disposition: A | Discharge status: Home / Self Care | Payer: PPO | Source: Ambulatory Visit | Attending: Internal Medicine | Admitting: Internal Medicine

## 2021-04-03 DIAGNOSIS — Z1231 Encounter for screening mammogram for malignant neoplasm of breast: Secondary | ICD-10-CM

## 2021-04-04 ENCOUNTER — Other Ambulatory Visit: Payer: Self-pay | Admitting: Internal Medicine

## 2021-04-04 DIAGNOSIS — R928 Other abnormal and inconclusive findings on diagnostic imaging of breast: Secondary | ICD-10-CM

## 2021-04-06 ENCOUNTER — Ambulatory Visit: Payer: PPO | Admitting: Internal Medicine

## 2021-04-11 ENCOUNTER — Ambulatory Visit (INDEPENDENT_AMBULATORY_CARE_PROVIDER_SITE_OTHER): Payer: PPO

## 2021-04-11 DIAGNOSIS — E119 Type 2 diabetes mellitus without complications: Secondary | ICD-10-CM

## 2021-04-11 DIAGNOSIS — E039 Hypothyroidism, unspecified: Secondary | ICD-10-CM

## 2021-04-11 DIAGNOSIS — G4700 Insomnia, unspecified: Secondary | ICD-10-CM

## 2021-04-11 DIAGNOSIS — I10 Essential (primary) hypertension: Secondary | ICD-10-CM

## 2021-04-11 DIAGNOSIS — E785 Hyperlipidemia, unspecified: Secondary | ICD-10-CM

## 2021-04-11 NOTE — Patient Instructions (Signed)
Visit Information ? ?Following are the goals we discussed today:  ? ?Track and Manage My Blood Pressure  ? ?Timeframe:  Long-Range Goal ?  ?- check blood pressure 3 times per week ?- choose a place to take my blood pressure (home, clinic or office, retail store) ?- write blood pressure results in a log or diary  ?  ?Why is this important?   ?You won't feel high blood pressure, but it can still hurt your blood vessels.  ?High blood pressure can cause heart or kidney problems. It can also cause a stroke.  ?Making lifestyle changes like losing a little weight or eating less salt will help.  ?Checking your blood pressure at home and at different times of the day can help to control blood pressure.  ?If the doctor prescribes medicine remember to take it the way the doctor ordered.  ?Call the office if you cannot afford the medicine or if there are questions about it.  ? ?Track and Manage My Blood Sugar  ? ?Timeframe:  Long-Range Goal ?Priority:  High ?Start Date:  10/11/2020                          ?Expected End Date:  01/11/2022                     ? ?Follow Up Date 10/2021 ?  ?- check blood sugar at prescribed times ?- check blood sugar if I feel it is too high or too low ?- enter blood sugar readings and medication or insulin into daily log ?- take the blood sugar log to all doctor visits ?- take the blood sugar meter to all doctor visits  ?  ?Why is this important?   ?Checking your blood sugar at home helps to keep it from getting very high or very low.  ?Writing the results in a diary or log helps the doctor know how to care for you.  ?Your blood sugar log should have the time, date and the results.  ?Also, write down the amount of insulin or other medicine that you take.  ?Other information, like what you ate, exercise done and how you were feeling, will also be helpful.   ? ?Plan: Telephone follow up appointment with care management team member scheduled for:  6 months ?The patient has been provided with contact  information for the care management team and has been advised to call with any health related questions or concerns.  ? ?Tomasa Blase, PharmD ?Clinical Pharmacist, [facility]  ? ?Please call the care guide team at [phone] if you need to cancel or reschedule your appointment.  ? ?Patient verbalizes understanding of instructions and care plan provided today and agrees to view in [facility]. Active MyChart status confirmed with patient.   ? ?

## 2021-04-11 NOTE — Progress Notes (Addendum)
? ?Chronic Care Management ?Pharmacy Note ? ?04/11/2021 ?Name:  Deanna Salazar MRN:  [id number] DOB:  01/05/1941 ? ?Summary: ?-Patient reports that since switching from zolpidem to temazepam, finds that she is getting the same amount of sleep per night, but is more fatigued throughout the day  ?-Reports that BP and BG are well controlled at home ?-Reports compliance to current medications ?-Notes that she has been having issues with constipation, unrelieved by use of miralax  ? ?Recommendations/Changes made from today's visit: ?-Patient to discuss concern about insomnia with next PCP visit  ?-Patient will trial use of metamucil daily to help with constipation, will hold miralax ?-Patient to continue to monitor BP and BG, will reach out should either become uncontrolled / if she has any issues with hypotension or hypoglycemia  ? ?Subjective: ?Deanna Salazar is an 81 y.o. year old female who is a primary patient of Plotnikov, Evie Lacks, MD.  The CCM team was consulted for assistance with disease management and care coordination needs.   ? ?Engaged with patient by telephone for follow up visit in response to provider referral for pharmacy case management and/or care coordination services.  ? ?Consent to Services:  ?The patient was given the following information about Chronic Care Management services today, agreed to services, and gave verbal consent: 1. CCM service includes personalized support from designated clinical staff supervised by the primary care provider, including individualized plan of care and coordination with other care providers 2. 24/7 contact phone numbers for assistance for urgent and routine care needs. 3. Service will only be billed when office clinical staff spend 20 minutes or more in a month to coordinate care. 4. Only one practitioner may furnish and bill the service in a calendar month. 5.The patient may stop CCM services at any time (effective at the end of the month) by phone call to the  office staff. 6. The patient will be responsible for cost sharing (co-pay) of up to 20% of the service fee (after annual deductible is met). Patient agreed to services and consent obtained. ? ?Patient Care Team: ?Plotnikov, Evie Lacks, MD as PCP - General ?Hennie Duos, MD (Rheumatology) ?Newt Minion, MD (Orthopedic Surgery) ?Rosita Kea, PA-C (Inactive) (Rheumatology) ?Tomasa Blase, [facility] as Pharmacist (Pharmacist) ?[facility] as Consulting Physician (Ophthalmology) ? ?Recent office visits:  ?02/23/2021 - Dr. Alain Marion - insomnia, d/c zolpidem, start temazepam qhs, start famotidine - f/u in 6 weeks  ?  ?Recent consult visits:  ?02/01/2021 - Dr. Katy Fitch - ophthalmology - note not available  ?  ?Hospital visits:  ?None in previous 6 months ? ?Objective: ? ?Lab Results  ?Component Value Date  ? CREATININE 0.85 02/23/2021  ? BUN 13 02/23/2021  ? GFR 64.22 02/23/2021  ? GFRNONAA 64 (L) 09/28/2013  ? GFRAA 74 (L) 09/28/2013  ? NA 139 02/23/2021  ? K 3.3 (L) 02/23/2021  ? CALCIUM 10.2 02/23/2021  ? CO2 34 (H) 02/23/2021  ? GLUCOSE 83 02/23/2021  ? ? ?Lab Results  ?Component Value Date/Time  ? HGBA1C 6.0 11/17/2020 10:19 AM  ? HGBA1C 5.9 08/17/2020 11:20 AM  ? GFR 64.22 02/23/2021 12:19 PM  ? GFR 54.24 (L) 11/17/2020 10:19 AM  ? MICROALBUR <0.7 02/23/2020 09:58 AM  ? MICROALBUR 1.8 09/08/2018 10:14 AM  ?  ?Last diabetic Eye exam:  ?Lab Results  ?Component Value Date/Time  ? HMDIABEYEEXA No Retinopathy 08/23/2020 12:00 AM  ?  ?Last diabetic Foot exam:  ?No results found for: HMDIABFOOTEX  ? ?  Lab Results  ?Component Value Date  ? CHOL 187 08/17/2020  ? HDL 46.00 08/17/2020  ? LDLCALC 114 (H) 08/17/2020  ? LDLDIRECT 180.9 07/26/2009  ? TRIG 132.0 08/17/2020  ? CHOLHDL 4 08/17/2020  ? ? ? ?  Latest Ref Rng & Units 02/23/2021  ? 12:19 PM 11/17/2020  ? 10:19 AM 08/17/2020  ? 11:20 AM  ?Hepatic Function  ?Total Protein 6.0 - 8.3 g/dL 7.1   7.0   6.8    ?Albumin 3.5 - 5.2 g/dL 4.4   4.3   4.0    ?AST 0 - 37  U/L _0 ?ALT 0 - 35 U/L _1 ?Alk Phosphatase 39 - 117 U/L 76   77   80    ?Total Bilirubin 0.2 - 1.2 mg/dL 0.5   0.5   0.4    ? ? ?Lab Results  ?Component Value Date/Time  ? TSH 1.81 02/23/2021 12:19 PM  ? TSH 1.64 08/17/2020 11:20 AM  ? FREET4 0.75 08/17/2020 11:20 AM  ? FREET4 0.67 04/22/2019 04:04 PM  ? ? ? ?  Latest Ref Rng & Units 02/23/2021  ? 12:19 PM 08/17/2020  ? 11:20 AM 02/23/2020  ?  9:58 AM  ?CBC  ?WBC 4.0 - 10.5 K/uL 6.0   6.0   6.3    ?Hemoglobin 12.0 - 15.0 g/dL 11.6   11.4   12.2    ?Hematocrit 36.0 - 46.0 % 36.1   35.4   37.1    ?Platelets 150.0 - 400.0 K/uL 214.0   208.0   234.0    ? ? ?Lab Results  ?Component Value Date/Time  ? VD25OH 60.91 04/22/2019 04:04 PM  ? VD25OH 52 06/24/2012 08:43 AM  ? VD25OH 57 07/26/2009 10:42 PM  ? ? ?Clinical ASCVD: No  ?The ASCVD Risk score (Arnett DK, et al., 2019) failed to calculate for the following reasons: ?  The 2019 ASCVD risk score is only valid for ages 25 to 67   ? ? ?  03/07/2021  ? 11:24 AM 02/23/2021  ? 11:26 AM 11/17/2020  ?  9:34 AM  ?Depression screen PHQ 2/9  ?Decreased Interest 0 0 0  ?Down, Depressed, Hopeless 0 0 0  ?PHQ - 2 Score 0 0 0  ?Altered sleeping 0 0 0  ?Tired, decreased energy 0 0 0  ?Change in appetite 0 0 0  ?Feeling bad or failure about yourself  0 0 0  ?Trouble concentrating 0 0 0  ?Moving slowly or fidgety/restless 0 0 0  ?Suicidal thoughts 0 0 0  ?PHQ-9 Score 0 0 0  ?Difficult doing work/chores Not difficult at all    ?  ?Social History  ? ?Tobacco Use  ?Smoking Status Never  ?Smokeless Tobacco Never  ? ?BP Readings from Last 3 Encounters:  ?03/07/21 133/70  ?02/23/21 140/80  ?11/17/20 130/62  ? ?Pulse Readings from Last 3 Encounters:  ?02/23/21 60  ?11/17/20 63  ?02/23/20 64  ? ?Wt Readings from Last 3 Encounters:  ?02/23/21 179 lb (81.2 kg)  ?01/04/21 186 lb (84.4 kg)  ?11/17/20 186 lb 12.8 oz (84.7 kg)  ? ?BMI Readings from Last 3 Encounters:  ?02/23/21 31.71 kg/m?  ?01/04/21 32.95 kg/m?  ?11/17/20 33.09  kg/m?  ? ? ?Assessment/Interventions: Review of patient past medical history, allergies, medications, health status, including review of consultants reports, laboratory and other test data, was performed as part of comprehensive evaluation  and provision of chronic care management services.  ? ?SDOH:  (Social Determinants of Health) assessments and interventions performed: Yes ? ?SDOH Screenings  ? ?Alcohol Screen: Low Risk   ? Last Alcohol Screening Score (AUDIT): 0  ?Depression (PHQ2-9): Low Risk   ? PHQ-2 Score: 0  ?Financial Resource Strain: Low Risk   ? Difficulty of Paying Living Expenses: Not hard at all  ?Food Insecurity: No Food Insecurity  ? Worried About Charity fundraiser in the Last Year: Never true  ? Ran Out of Food in the Last Year: Never true  ?Housing: Low Risk   ? Last Housing Risk Score: 0  ?Physical Activity: Sufficiently Active  ? Days of Exercise per Week: 5 days  ? Minutes of Exercise per Session: 40 min  ?Social Connections: Moderately Integrated  ? Frequency of Communication with Friends and Family: More than three times a week  ? Frequency of Social Gatherings with Friends and Family: More than three times a week  ? Attends Religious Services: More than 4 times per year  ? Active Member of Clubs or Organizations: No  ? Attends Archivist Meetings: More than 4 times per year  ? Marital Status: Widowed  ?Stress: No Stress Concern Present  ? Feeling of Stress : Not at all  ?Tobacco Use: Low Risk   ? Smoking Tobacco Use: Never  ? Smokeless Tobacco Use: Never  ? Passive Exposure: Not on file  ?Transportation Needs: No Transportation Needs  ? Lack of Transportation (Medical): No  ? Lack of Transportation (Non-Medical): No  ? ? ?CCM Care Plan ? ?Allergies  ?Allergen Reactions  ? Codeine Nausea And Vomiting and Other (See Comments)  ?  Pt states that she is able to take on a short term basis.    ? Hydrocodone Nausea And Vomiting and Other (See Comments)  ?  Pt states that she is able to  take on a short term basis.    ? Hydrocodone-Acetaminophen   ?  Other reaction(s): Unknown  ? Lovastatin Itching and Other (See Comments)  ?  Reaction:  Leg cramps   ? Pantoprazole   ?  ?abd pain  ? Pepcid

## 2021-04-13 ENCOUNTER — Telehealth: Payer: Self-pay | Admitting: Internal Medicine

## 2021-04-13 NOTE — Telephone Encounter (Signed)
Pt inquiring if provider will discontinue temazepam (RESTORIL) 15 MG capsule and re-prescribe zolpidem ? ?Pt states temazepam (RESTORIL) 15 MG capsule makes her drowsy the next day and she is not resting a night ? ?

## 2021-04-15 MED ORDER — ZOLPIDEM TARTRATE 10 MG PO TABS: 5.0000 mg | ORAL_TABLET | Freq: Every evening | ORAL | 1 refills | Status: DC | PRN

## 2021-04-15 NOTE — Telephone Encounter (Signed)
Okay.  Follow-up with me in 1-2 months.  Thank you ?

## 2021-04-18 ENCOUNTER — Telehealth: Payer: Self-pay | Admitting: Internal Medicine

## 2021-04-18 NOTE — Telephone Encounter (Signed)
Staff at [facility] called about needing a Dr. Alain Marion to give a final signature on PT referral for her imaging.  ? ?[phone] ?

## 2021-04-19 ENCOUNTER — Other Ambulatory Visit: Payer: PPO

## 2021-04-19 NOTE — Telephone Encounter (Signed)
Pt called and wanted to followup regarding this matter.  ? ?Pt states her appt was canceled this morning and wanted to know why paperwork cannot be signed.  ? ?Relayed message from MD to pt and she is requesting assistant to call [facility] to see exactly what is needed so that she gets it done.  ?

## 2021-04-19 NOTE — Telephone Encounter (Signed)
I do not understand the request.  Thank you ?

## 2021-04-20 ENCOUNTER — Encounter: Payer: Self-pay | Admitting: Internal Medicine

## 2021-04-20 ENCOUNTER — Other Ambulatory Visit: Payer: PPO

## 2021-04-20 NOTE — Telephone Encounter (Signed)
Margarita from [facility] called and states that she faxed a form yesterday that just needs to be signed and faxed back. Patient is scheduled for 10:30 this morning. Fax number is [phone] or [phone] ?

## 2021-04-20 NOTE — Telephone Encounter (Addendum)
Download form place on MD desk for signature.Marland KitchenJohny Chess ? ?MD signed.. [facility]ed [facility] to [facility]. Pt also notified via mychart.,./lmb ?

## 2021-04-21 NOTE — Telephone Encounter (Signed)
Order was faxed back yesterday and pt was notified MD has signed orders.Marland KitchenJohny Chess ?

## 2021-04-21 NOTE — Telephone Encounter (Signed)
Noted. Thanks.

## 2021-04-25 ENCOUNTER — Ambulatory Visit: Payer: PPO

## 2021-04-25 ENCOUNTER — Ambulatory Visit
Admission: RE | Admit: 2021-04-25 | Discharge: 2021-04-25 | Disposition: A | Discharge status: Home / Self Care | Payer: PPO | Source: Ambulatory Visit | Attending: Internal Medicine | Admitting: Internal Medicine

## 2021-04-25 DIAGNOSIS — R928 Other abnormal and inconclusive findings on diagnostic imaging of breast: Secondary | ICD-10-CM

## 2021-05-04 ENCOUNTER — Encounter: Payer: Self-pay | Admitting: Internal Medicine

## 2021-05-04 ENCOUNTER — Ambulatory Visit (INDEPENDENT_AMBULATORY_CARE_PROVIDER_SITE_OTHER): Payer: PPO | Admitting: Internal Medicine

## 2021-05-04 VITALS — BP 132/62 | HR 58 | Temp 98.6°F | Ht 63.0 in | Wt 179.4 lb

## 2021-05-04 DIAGNOSIS — G4700 Insomnia, unspecified: Secondary | ICD-10-CM

## 2021-05-04 DIAGNOSIS — R1013 Epigastric pain: Secondary | ICD-10-CM | POA: Diagnosis not present

## 2021-05-04 DIAGNOSIS — M06031 Rheumatoid arthritis without rheumatoid factor, right wrist: Secondary | ICD-10-CM

## 2021-05-04 DIAGNOSIS — M06032 Rheumatoid arthritis without rheumatoid factor, left wrist: Secondary | ICD-10-CM | POA: Diagnosis not present

## 2021-05-04 LAB — VITAMIN D 25 HYDROXY (VIT D DEFICIENCY, FRACTURES): VITD: 74.3 ng/mL (ref 30.00–100.00)

## 2021-05-04 LAB — COMPREHENSIVE METABOLIC PANEL
ALT: 9 U/L (ref 0–35)
AST: 18 U/L (ref 0–37)
Albumin: 4.2 g/dL (ref 3.5–5.2)
Alkaline Phosphatase: 75 U/L (ref 39–117)
BUN: 11 mg/dL (ref 6–23)
CO2: 27 mEq/L (ref 19–32)
Calcium: 9.9 mg/dL (ref 8.4–10.5)
Chloride: 102 mEq/L (ref 96–112)
Creatinine, Ser: 0.95 mg/dL (ref 0.40–1.20)
GFR: 56.12 mL/min — ABNORMAL LOW (ref >60.00)
Glucose, Bld: 101 mg/dL — ABNORMAL HIGH (ref 70–99)
Potassium: 3.8 mEq/L (ref 3.5–5.1)
Sodium: 138 mEq/L (ref 135–145)
Total Bilirubin: 0.5 mg/dL (ref 0.2–1.2)
Total Protein: 7 g/dL (ref 6.0–8.3)

## 2021-05-04 MED ORDER — TRULANCE 3 MG PO TABS: 3.0000 mg | ORAL_TABLET | Freq: Every day | ORAL | 11 refills | Status: DC

## 2021-05-04 NOTE — Assessment & Plan Note (Signed)
Sleep time tea ?Pt tried many things - not helping ?

## 2021-05-04 NOTE — Assessment & Plan Note (Signed)
Doing fair ?Blue-Emu cream was recommended to use 2-3 times a day ? ?

## 2021-05-04 NOTE — Progress Notes (Signed)
? ?Subjective:  ?Patient ID: Deanna Salazar, female    DOB: 07/21/1940  Age: 81 y.o. MRN: [id number] ? ?CC: Follow-up (2 month follow-up) ? ? ?HPI ?Lyndee Hensen presents for HTN, low K, IBS - c, sleep disorder ?C/o constipation, elevated calcium ? ?Outpatient Medications Prior to Visit  ?Medication Sig Dispense Refill  ? acetaminophen (TYLENOL) 500 MG tablet Take 500-1,000 mg by mouth every 6 (six) hours as needed for mild pain, moderate pain, fever or headache.    ? amLODipine (NORVASC) 10 MG tablet TAKE 1 TABLET(10 MG) BY MOUTH DAILY 90 tablet 3  ? aspirin (ASPIRIN CHILDRENS) 81 MG chewable tablet Chew 1 tablet (81 mg total) by mouth daily. 36 tablet 11  ? Blood Glucose Monitoring Suppl (ONE TOUCH ULTRA 2) w/Device KIT Use to check blood sugars daily 1 kit 0  ? brimonidine (ALPHAGAN) 0.2 % ophthalmic solution Place 1 drop into both eyes in the morning and at bedtime.    ? carvedilol (COREG) 25 MG tablet TAKE 1 TABLET(25 MG) BY MOUTH TWICE DAILY WITH A MEAL 180 tablet 1  ? cholecalciferol (VITAMIN D) 1000 units tablet Take 1,000 Units by mouth daily.    ? diclofenac Sodium (VOLTAREN) 1 % GEL Apply 2 g topically 4 (four) times daily.    ? famotidine (PEPCID) 40 MG tablet 1 tablet at bedtime. 1 tablet at bedtime.    ? folic acid (FOLVITE) 1 MG tablet Take 1 tablet daily    ? furosemide (LASIX) 20 MG tablet TAKE 1 TO 2 TABLETS(20 TO 40 MG) BY MOUTH DAILY AS NEEDED FOR SWELLING (Patient taking differently: Take 10 mg by mouth daily as needed for edema or fluid. Takes at least twice weekly) 60 tablet 5  ? glucose blood (ONETOUCH ULTRA) test strip TEST BLOOD SUGAR LEVELS DAILY 100 strip 3  ? ketotifen (ZADITOR) 0.025 % ophthalmic solution Place 1 drop into both eyes 2 (two) times daily.    ? Lancets (ONETOUCH DELICA PLUS [id number]) MISC USE TO TEST BLOOD SUGAR LEVELS 100 each 3  ? latanoprost (XALATAN) 0.005 % ophthalmic solution Place 1 drop into both eyes at bedtime.  3  ? levothyroxine (SYNTHROID) 25 MCG tablet  TAKE 1 TABLET BY MOUTH ONCE DAILY BEFORE BREAKFAST 90 tablet 2  ? Lidocaine 4 % PTCH Apply 1 patch topically daily. 12 hours on and 12 hours off    ? meclizine (ANTIVERT) 25 MG tablet TAKE 1 TABLET BY MOUTH THREE TIMES DAILY AS NEEDED FOR DIZZINESS OR NAUSEA 30 tablet 3  ? metFORMIN (GLUCOPHAGE) 500 MG tablet TAKE 1 TABLET(500 MG) BY MOUTH TWICE DAILY WITH A MEAL 180 tablet 3  ? methotrexate 2.5 MG tablet Take 20 mg by mouth once a week.    ? Multiple Vitamins-Minerals (PRESERVISION AREDS 2+MULTI VIT PO) Take 1 capsule by mouth in the morning and at bedtime.    ? Polyethyl Glycol-Propyl Glycol (SYSTANE OP) Apply 1 drop to eye as needed.    ? Polyethylene Glycol 3350 (MIRALAX PO) Take by mouth.    ? potassium chloride (KLOR-CON M) 10 MEQ tablet Take 1 tablet (10 mEq total) by mouth daily. 90 tablet 3  ? zolpidem (AMBIEN) 10 MG tablet Take 0.5-1 tablets (5-10 mg total) by mouth at bedtime as needed for sleep. 30 tablet 1  ? famotidine (PEPCID) 40 MG tablet Take 1 tablet (40 mg total) by mouth daily. 90 tablet 3  ? folic acid (FOLVITE) 1 MG tablet Take 1 mg by mouth daily.    ? ?  No facility-administered medications prior to visit.  ? ? ?ROS: ?Review of Systems  ?Constitutional:  Positive for fatigue. Negative for activity change, appetite change, chills and unexpected weight change.  ?HENT:  Negative for congestion, mouth sores and sinus pressure.   ?Eyes:  Negative for visual disturbance.  ?Respiratory:  Negative for cough and chest tightness.   ?Gastrointestinal:  Positive for abdominal distention, abdominal pain and constipation. Negative for nausea.  ?Genitourinary:  Negative for difficulty urinating, frequency and vaginal pain.  ?Musculoskeletal:  Negative for back pain and gait problem.  ?Skin:  Negative for pallor and rash.  ?Neurological:  Negative for dizziness, tremors, weakness, numbness and headaches.  ?Psychiatric/Behavioral:  Negative for confusion and sleep disturbance.   ? ?Objective:  ?BP 132/62 (BP  Location: Right Arm)   Pulse (!) 58   Temp 98.6 ?F (37 ?C) (Oral)   Ht 5' 3" (1.6 m)   Wt 179 lb 6.4 oz (81.4 kg)   SpO2 98%   BMI 31.78 kg/m?  ? ?BP Readings from Last 3 Encounters:  ?05/04/21 132/62  ?03/07/21 133/70  ?02/23/21 140/80  ? ? ?Wt Readings from Last 3 Encounters:  ?05/04/21 179 lb 6.4 oz (81.4 kg)  ?02/23/21 179 lb (81.2 kg)  ?01/04/21 186 lb (84.4 kg)  ? ? ?Physical Exam ?Constitutional:   ?   General: She is not in acute distress. ?   Appearance: She is well-developed. She is obese.  ?HENT:  ?   Head: Normocephalic.  ?   Right Ear: External ear normal.  ?   Left Ear: External ear normal.  ?   Nose: Nose normal.  ?Eyes:  ?   General:     ?   Right eye: No discharge.     ?   Left eye: No discharge.  ?   Conjunctiva/sclera: Conjunctivae normal.  ?   Pupils: Pupils are equal, round, and reactive to light.  ?Neck:  ?   Thyroid: No thyromegaly.  ?   Vascular: No JVD.  ?   Trachea: No tracheal deviation.  ?Cardiovascular:  ?   Rate and Rhythm: Normal rate and regular rhythm.  ?   Heart sounds: Normal heart sounds.  ?Pulmonary:  ?   Effort: No respiratory distress.  ?   Breath sounds: No stridor. No wheezing.  ?Abdominal:  ?   General: Bowel sounds are normal. There is no distension.  ?   Palpations: Abdomen is soft. There is no mass.  ?   Tenderness: There is no abdominal tenderness. There is no guarding or rebound.  ?Musculoskeletal:     ?   General: No tenderness.  ?   Cervical back: Normal range of motion and neck supple. No rigidity.  ?Lymphadenopathy:  ?   Cervical: No cervical adenopathy.  ?Skin: ?   Findings: No erythema or rash.  ?Neurological:  ?   Cranial Nerves: No cranial nerve deficit.  ?   Motor: No weakness or abnormal muscle tone.  ?   Coordination: Coordination normal.  ?   Deep Tendon Reflexes: Reflexes normal.  ?Psychiatric:     ?   Behavior: Behavior normal.     ?   Thought Content: Thought content normal.     ?   Judgment: Judgment normal.  ? ? ?Lab Results  ?Component Value Date   ? WBC 6.0 02/23/2021  ? HGB 11.6 (L) 02/23/2021  ? HCT 36.1 02/23/2021  ? PLT 214.0 02/23/2021  ? GLUCOSE 83 02/23/2021  ? CHOL 187 08/17/2020  ?  TRIG 132.0 08/17/2020  ? HDL 46.00 08/17/2020  ? LDLDIRECT 180.9 07/26/2009  ? LDLCALC 114 (H) 08/17/2020  ? ALT 12 02/23/2021  ? AST 21 02/23/2021  ? NA 139 02/23/2021  ? K 3.3 (L) 02/23/2021  ? CL 101 02/23/2021  ? CREATININE 0.85 02/23/2021  ? BUN 13 02/23/2021  ? CO2 34 (H) 02/23/2021  ? TSH 1.81 02/23/2021  ? HGBA1C 6.0 11/17/2020  ? MICROALBUR <0.7 02/23/2020  ? ? ?MM DIAG BREAST TOMO UNI RIGHT ? ?Result Date: 04/25/2021 ?CLINICAL DATA:  81 year old female recalled from screening mammogram dated 04/03/2021 for a possible right breast asymmetry. EXAM: DIGITAL DIAGNOSTIC UNILATERAL RIGHT MAMMOGRAM WITH TOMOSYNTHESIS AND CAD TECHNIQUE: Right digital diagnostic mammography and breast tomosynthesis was performed. The images were evaluated with computer-aided detection. COMPARISON:  Previous exam(s). ACR Breast Density Category b: There are scattered areas of fibroglandular density. FINDINGS: An asymmetry in the central subareolar right breast on the CC projection resolves into well dispersed fibroglandular tissue on today's additional views. No suspicious findings identified. IMPRESSION: No mammographic evidence of malignancy. RECOMMENDATION: Screening mammogram in one year.(Code:SM-B-01Y) I have discussed the findings and recommendations with the patient. If applicable, a reminder letter will be sent to the patient regarding the next appointment. BI-RADS CATEGORY  1: Negative. Electronically Signed   By: Kristopher Oppenheim M.D.   On: 04/25/2021 09:16 ? ? ?Assessment & Plan:  ? ?Problem List Items Addressed This Visit   ? ? INSOMNIA, PERSISTENT  ?  Sleep time tea ?Pt tried many things - not helping ? ?  ?  ? Rheumatoid arthritis ([place])  ?  Doing fair ?Blue-Emu cream was recommended to use 2-3 times a day ? ? ?  ?  ? Relevant Orders  ? Comprehensive metabolic panel  ? PTH,  intact and calcium  ? VITAMIN D 25 Hydroxy (Vit-D Deficiency, Fractures)  ? ABDOMINAL PAIN-EPIGASTRIC - Primary  ?  IBS-c - worse ?Treat constipation ?Miralax+laxative tea or Trulance if covered ?GI ref - Dr Fuller Plan

## 2021-05-04 NOTE — Patient Instructions (Signed)
Blue-Emu cream -- use 2-3 times a day ? ?

## 2021-05-04 NOTE — Assessment & Plan Note (Addendum)
IBS-c - worse ?Treat constipation ?Miralax+laxative tea or Trulance if covered ?GI ref - Dr Fuller Plan ?

## 2021-05-05 ENCOUNTER — Telehealth: Payer: Self-pay | Admitting: *Deleted

## 2021-05-05 NOTE — Telephone Encounter (Signed)
Rec;d PA for med Trulance '3mg'$  completed on cover-my-meds w/ (Key: [id number]). Rec;d msg stating Your information has been sent to [facility]. Will check for approval later..lmb ?

## 2021-05-06 LAB — PTH, INTACT AND CALCIUM
Calcium: 10 mg/dL (ref 8.6–10.4)
PTH: 44 pg/mL (ref 16–77)

## 2021-05-07 DIAGNOSIS — E119 Type 2 diabetes mellitus without complications: Secondary | ICD-10-CM | POA: Diagnosis not present

## 2021-05-07 DIAGNOSIS — E039 Hypothyroidism, unspecified: Secondary | ICD-10-CM | POA: Diagnosis not present

## 2021-05-07 DIAGNOSIS — I10 Essential (primary) hypertension: Secondary | ICD-10-CM | POA: Diagnosis not present

## 2021-05-07 DIAGNOSIS — E785 Hyperlipidemia, unspecified: Secondary | ICD-10-CM

## 2021-05-08 ENCOUNTER — Telehealth: Payer: Self-pay | Admitting: Internal Medicine

## 2021-05-08 NOTE — Telephone Encounter (Signed)
Needs to speak with some one on  trulance3 mg. Oral tablet ?

## 2021-05-09 NOTE — Telephone Encounter (Signed)
Trulance not covered by insurance. Pt aware to take miralax & Senokot.Marland KitchenJohny Chess ?

## 2021-05-09 NOTE — Telephone Encounter (Signed)
Rerc'd denial letter it states Medicare Part D did not approve due to not being on formulary. The formulary alternative os Linzess.Marland KitchenJohny Chess ?

## 2021-05-09 NOTE — Telephone Encounter (Signed)
Trulance was not covered.  Please see the other message.  Thanks ?

## 2021-05-09 NOTE — Telephone Encounter (Signed)
Noted.  The patient tried Linzess in the past.  It did not work.  She can continue with MiraLAX and Senokot.  Thanks ?

## 2021-05-09 NOTE — Telephone Encounter (Signed)
Notified pt w/MD response.../lmb 

## 2021-05-31 DIAGNOSIS — H401111 Primary open-angle glaucoma, right eye, mild stage: Secondary | ICD-10-CM | POA: Diagnosis not present

## 2021-05-31 DIAGNOSIS — H209 Unspecified iridocyclitis: Secondary | ICD-10-CM | POA: Diagnosis not present

## 2021-05-31 DIAGNOSIS — Z961 Presence of intraocular lens: Secondary | ICD-10-CM | POA: Diagnosis not present

## 2021-05-31 DIAGNOSIS — E119 Type 2 diabetes mellitus without complications: Secondary | ICD-10-CM | POA: Diagnosis not present

## 2021-05-31 DIAGNOSIS — H0288B Meibomian gland dysfunction left eye, upper and lower eyelids: Secondary | ICD-10-CM | POA: Diagnosis not present

## 2021-05-31 DIAGNOSIS — H1045 Other chronic allergic conjunctivitis: Secondary | ICD-10-CM | POA: Diagnosis not present

## 2021-05-31 DIAGNOSIS — H16223 Keratoconjunctivitis sicca, not specified as Sjogren's, bilateral: Secondary | ICD-10-CM | POA: Diagnosis not present

## 2021-05-31 DIAGNOSIS — H0288A Meibomian gland dysfunction right eye, upper and lower eyelids: Secondary | ICD-10-CM | POA: Diagnosis not present

## 2021-05-31 DIAGNOSIS — H401122 Primary open-angle glaucoma, left eye, moderate stage: Secondary | ICD-10-CM | POA: Diagnosis not present

## 2021-06-03 ENCOUNTER — Other Ambulatory Visit: Payer: Self-pay | Admitting: Internal Medicine

## 2021-06-28 ENCOUNTER — Other Ambulatory Visit: Payer: Self-pay | Admitting: Internal Medicine

## 2021-06-29 ENCOUNTER — Encounter: Payer: Self-pay | Admitting: Internal Medicine

## 2021-07-19 DIAGNOSIS — M0609 Rheumatoid arthritis without rheumatoid factor, multiple sites: Secondary | ICD-10-CM | POA: Diagnosis not present

## 2021-07-27 ENCOUNTER — Ambulatory Visit (INDEPENDENT_AMBULATORY_CARE_PROVIDER_SITE_OTHER): Payer: PPO | Admitting: Internal Medicine

## 2021-07-27 ENCOUNTER — Encounter: Payer: Self-pay | Admitting: Internal Medicine

## 2021-07-27 DIAGNOSIS — R252 Cramp and spasm: Secondary | ICD-10-CM

## 2021-07-27 DIAGNOSIS — M06031 Rheumatoid arthritis without rheumatoid factor, right wrist: Secondary | ICD-10-CM | POA: Diagnosis not present

## 2021-07-27 DIAGNOSIS — G4451 Hemicrania continua: Secondary | ICD-10-CM

## 2021-07-27 DIAGNOSIS — R634 Abnormal weight loss: Secondary | ICD-10-CM | POA: Diagnosis not present

## 2021-07-27 DIAGNOSIS — M06032 Rheumatoid arthritis without rheumatoid factor, left wrist: Secondary | ICD-10-CM | POA: Diagnosis not present

## 2021-07-27 DIAGNOSIS — E119 Type 2 diabetes mellitus without complications: Secondary | ICD-10-CM

## 2021-07-27 DIAGNOSIS — R519 Headache, unspecified: Secondary | ICD-10-CM | POA: Insufficient documentation

## 2021-07-27 DIAGNOSIS — G4700 Insomnia, unspecified: Secondary | ICD-10-CM

## 2021-07-27 LAB — COMPREHENSIVE METABOLIC PANEL
ALT: 10 U/L (ref 0–35)
AST: 19 U/L (ref 0–37)
Albumin: 4.1 g/dL (ref 3.5–5.2)
Alkaline Phosphatase: 74 U/L (ref 39–117)
BUN: 15 mg/dL (ref 6–23)
CO2: 28 mEq/L (ref 19–32)
Calcium: 9.8 mg/dL (ref 8.4–10.5)
Chloride: 104 mEq/L (ref 96–112)
Creatinine, Ser: 1.11 mg/dL (ref 0.40–1.20)
GFR: 46.48 mL/min — ABNORMAL LOW (ref >60.00)
Glucose, Bld: 103 mg/dL — ABNORMAL HIGH (ref 70–99)
Potassium: 4.2 mEq/L (ref 3.5–5.1)
Sodium: 140 mEq/L (ref 135–145)
Total Bilirubin: 0.4 mg/dL (ref 0.2–1.2)
Total Protein: 6.8 g/dL (ref 6.0–8.3)

## 2021-07-27 LAB — URINALYSIS
Bilirubin Urine: NEGATIVE
Hgb urine dipstick: NEGATIVE
Ketones, ur: NEGATIVE
Leukocytes,Ua: NEGATIVE
Nitrite: NEGATIVE
Specific Gravity, Urine: <=1.005 — AB (ref 1.000–1.030)
Total Protein, Urine: NEGATIVE
Urine Glucose: NEGATIVE
Urobilinogen, UA: 0.2 (ref 0.0–1.0)
pH: 6 (ref 5.0–8.0)

## 2021-07-27 LAB — HEMOGLOBIN A1C: Hgb A1c MFr Bld: 5.8 % (ref 4.6–6.5)

## 2021-07-27 MED ORDER — TRIAZOLAM 0.25 MG PO TABS: 0.2500 mg | ORAL_TABLET | Freq: Every evening | ORAL | 5 refills | Status: DC | PRN

## 2021-07-27 NOTE — Assessment & Plan Note (Signed)
Mild L Better on allergy meds

## 2021-07-27 NOTE — Assessment & Plan Note (Signed)
Off steroids On MTX Blue-Emu cream was recommended to use 2-3 times a day

## 2021-07-27 NOTE — Assessment & Plan Note (Signed)
Check A1c. 

## 2021-07-27 NOTE — Assessment & Plan Note (Signed)
Try Magnesium oil spray

## 2021-07-27 NOTE — Patient Instructions (Signed)
Try Magnesium oil spray for cramps

## 2021-07-27 NOTE — Assessment & Plan Note (Signed)
No wt loss lately

## 2021-07-27 NOTE — Progress Notes (Signed)
Subjective:  Patient ID: Deanna Salazar, female    DOB: 09/19/1940  Age: 81 y.o. MRN: [id number]  CC: Follow-up (Not sleeping well ) and Headache (Having a lot of headache in the left side of her head/It been going on for about three weeks )   HPI Deanna Salazar presents for insomnia, HTN, hypothyroidism C/o a mild L side HA and sinus congestion x 2 weeks  Outpatient Medications Prior to Visit  Medication Sig Dispense Refill   acetaminophen (TYLENOL) 500 MG tablet Take 500-1,000 mg by mouth every 6 (six) hours as needed for mild pain, moderate pain, fever or headache.     amLODipine (NORVASC) 10 MG tablet Take 1 tablet (10 mg total) by mouth daily. Annual appt w/labs due in Aug must see provider for future refills 90 tablet 0   aspirin (ASPIRIN CHILDRENS) 81 MG chewable tablet Chew 1 tablet (81 mg total) by mouth daily. 36 tablet 11   Blood Glucose Monitoring Suppl (ONE TOUCH ULTRA 2) w/Device KIT Use to check blood sugars daily 1 kit 0   brimonidine (ALPHAGAN) 0.2 % ophthalmic solution Place 1 drop into both eyes in the morning and at bedtime.     carvedilol (COREG) 25 MG tablet TAKE 1 TABLET(25 MG) BY MOUTH TWICE DAILY WITH A MEAL 180 tablet 1   cholecalciferol (VITAMIN D) 1000 units tablet Take 1,000 Units by mouth daily.     diclofenac Sodium (VOLTAREN) 1 % GEL Apply 2 g topically 4 (four) times daily.     famotidine (PEPCID) 40 MG tablet 1 tablet at bedtime. 1 tablet at bedtime.     folic acid (FOLVITE) 1 MG tablet Take 1 tablet daily     furosemide (LASIX) 20 MG tablet TAKE 1 TO 2 TABLETS(20 TO 40 MG) BY MOUTH DAILY AS NEEDED FOR SWELLING (Patient taking differently: Take 10 mg by mouth daily as needed for edema or fluid. Takes at least twice weekly) 60 tablet 5   glucose blood (ONETOUCH ULTRA) test strip TEST BLOOD SUGAR LEVELS DAILY 100 strip 3   ketotifen (ZADITOR) 0.025 % ophthalmic solution Place 1 drop into both eyes 2 (two) times daily.     Lancets (ONETOUCH DELICA PLUS  [id number]) MISC USE TO TEST BLOOD SUGAR LEVELS 100 each 3   latanoprost (XALATAN) 0.005 % ophthalmic solution Place 1 drop into both eyes at bedtime.  3   levothyroxine (SYNTHROID) 25 MCG tablet TAKE 1 TABLET BY MOUTH ONCE DAILY BEFORE BREAKFAST 90 tablet 2   Lidocaine 4 % PTCH Apply 1 patch topically daily. 12 hours on and 12 hours off     meclizine (ANTIVERT) 25 MG tablet TAKE 1 TABLET BY MOUTH THREE TIMES DAILY AS NEEDED FOR DIZZINESS OR NAUSEA 30 tablet 3   metFORMIN (GLUCOPHAGE) 500 MG tablet TAKE 1 TABLET(500 MG) BY MOUTH TWICE DAILY WITH A MEAL 180 tablet 3   methotrexate 2.5 MG tablet Take 20 mg by mouth once a week.     Multiple Vitamins-Minerals (PRESERVISION AREDS 2+MULTI VIT PO) Take 1 capsule by mouth in the morning and at bedtime.     Polyethyl Glycol-Propyl Glycol (SYSTANE OP) Apply 1 drop to eye as needed.     Polyethylene Glycol 3350 (MIRALAX PO) Take by mouth.     potassium chloride (KLOR-CON M) 10 MEQ tablet Take 1 tablet (10 mEq total) by mouth daily. 90 tablet 3   SHINGRIX injection      zolpidem (AMBIEN) 10 MG tablet TAKE 1/2 TO 1 TABLET(5 TO  10 MG) BY MOUTH AT BEDTIME AS NEEDED FOR SLEEP 30 tablet 3   No facility-administered medications prior to visit.    ROS: Review of Systems  Constitutional:  Positive for fatigue. Negative for activity change, appetite change, chills and unexpected weight change.  HENT:  Positive for sinus pressure. Negative for congestion and mouth sores.   Eyes:  Negative for visual disturbance.  Respiratory:  Negative for cough and chest tightness.   Gastrointestinal:  Negative for abdominal pain and nausea.  Genitourinary:  Negative for difficulty urinating, frequency and vaginal pain.  Musculoskeletal:  Negative for back pain and gait problem.  Skin:  Negative for pallor and rash.  Neurological:  Positive for headaches. Negative for dizziness, tremors, weakness and numbness.  Psychiatric/Behavioral:  Positive for sleep disturbance. Negative  for confusion.     Objective:  BP 120/64 (BP Location: Right Arm, Patient Position: Sitting, Cuff Size: Large)   Pulse (!) 58   Temp 98.3 F (36.8 C) (Oral)   Ht 5' 3"  (1.6 m)   Wt 179 lb 2 oz (81.3 kg)   SpO2 97%   BMI 31.73 kg/m   BP Readings from Last 3 Encounters:  07/27/21 120/64  05/04/21 132/62  03/07/21 133/70    Wt Readings from Last 3 Encounters:  07/27/21 179 lb 2 oz (81.3 kg)  05/04/21 179 lb 6.4 oz (81.4 kg)  02/23/21 179 lb (81.2 kg)    Physical Exam Constitutional:      General: She is not in acute distress.    Appearance: She is well-developed. She is obese.  HENT:     Head: Normocephalic.     Right Ear: Ear canal and external ear normal.     Left Ear: Ear canal and external ear normal.     Nose: Congestion present.     Mouth/Throat:     Pharynx: Oropharynx is clear.  Eyes:     General:        Right eye: No discharge.        Left eye: No discharge.     Conjunctiva/sclera: Conjunctivae normal.     Pupils: Pupils are equal, round, and reactive to light.  Neck:     Thyroid: No thyromegaly.     Vascular: No JVD.     Trachea: No tracheal deviation.  Cardiovascular:     Rate and Rhythm: Normal rate and regular rhythm.     Heart sounds: Normal heart sounds.  Pulmonary:     Effort: No respiratory distress.     Breath sounds: No stridor. No wheezing.  Abdominal:     General: Bowel sounds are normal. There is no distension.     Palpations: Abdomen is soft. There is no mass.     Tenderness: There is no abdominal tenderness. There is no guarding or rebound.  Musculoskeletal:        General: Tenderness present.     Cervical back: Normal range of motion and neck supple. No rigidity.  Lymphadenopathy:     Cervical: No cervical adenopathy.  Skin:    Findings: No erythema or rash.  Neurological:     Cranial Nerves: No cranial nerve deficit.     Motor: No abnormal muscle tone.     Coordination: Coordination normal.     Deep Tendon Reflexes: Reflexes  normal.  Psychiatric:        Behavior: Behavior normal.        Thought Content: Thought content normal.        Judgment: Judgment normal.  Lab Results  Component Value Date   WBC 6.0 02/23/2021   HGB 11.6 (L) 02/23/2021   HCT 36.1 02/23/2021   PLT 214.0 02/23/2021   GLUCOSE 101 (H) 05/04/2021   CHOL 187 08/17/2020   TRIG 132.0 08/17/2020   HDL 46.00 08/17/2020   LDLDIRECT 180.9 07/26/2009   LDLCALC 114 (H) 08/17/2020   ALT 9 05/04/2021   AST 18 05/04/2021   NA 138 05/04/2021   K 3.8 05/04/2021   CL 102 05/04/2021   CREATININE 0.95 05/04/2021   BUN 11 05/04/2021   CO2 27 05/04/2021   TSH 1.81 02/23/2021   HGBA1C 6.0 11/17/2020   MICROALBUR <0.7 02/23/2020    MM DIAG BREAST TOMO UNI RIGHT  Result Date: 04/25/2021 CLINICAL DATA:  81 year old female recalled from screening mammogram dated 04/03/2021 for a possible right breast asymmetry. EXAM: DIGITAL DIAGNOSTIC UNILATERAL RIGHT MAMMOGRAM WITH TOMOSYNTHESIS AND CAD TECHNIQUE: Right digital diagnostic mammography and breast tomosynthesis was performed. The images were evaluated with computer-aided detection. COMPARISON:  Previous exam(s). ACR Breast Density Category b: There are scattered areas of fibroglandular density. FINDINGS: An asymmetry in the central subareolar right breast on the CC projection resolves into well dispersed fibroglandular tissue on today's additional views. No suspicious findings identified. IMPRESSION: No mammographic evidence of malignancy. RECOMMENDATION: Screening mammogram in one year.(Code:SM-B-01Y) I have discussed the findings and recommendations with the patient. If applicable, a reminder letter will be sent to the patient regarding the next appointment. BI-RADS CATEGORY  1: Negative. Electronically Signed   By: Kristopher Oppenheim M.D.   On: 04/25/2021 09:16   Assessment & Plan:   Problem List Items Addressed This Visit     Cramp in limb    Try Magnesium oil spray      Diabetes type 2,  controlled ([place])    Check A1c      INSOMNIA, PERSISTENT    Not sleeping too well. D/c Zolpidem Start Halcion      Rheumatoid arthritis ([place])    Off steroids On MTX Blue-Emu cream was recommended to use 2-3 times a day      Weight loss    No wt loss lately         Meds ordered this encounter  Medications   triazolam (HALCION) 0.25 MG tablet    Sig: Take 1 tablet (0.25 mg total) by mouth at bedtime as needed for sleep.    Dispense:  30 tablet    Refill:  5      Follow-up: No follow-ups on file.  Walker Kehr, MD

## 2021-07-27 NOTE — Assessment & Plan Note (Signed)
Not sleeping too well. D/c Zolpidem Start Halcion

## 2021-07-31 ENCOUNTER — Telehealth: Payer: Self-pay | Admitting: Internal Medicine

## 2021-07-31 NOTE — Telephone Encounter (Signed)
Is she taking the halcion ?  I'm not sure if the muscle relaxer is the best option - they can cause a lotof side effects - it may be best for her to come in and be seen.

## 2021-07-31 NOTE — Telephone Encounter (Signed)
Pt called in and is requesting rx for muscle relaxer. States her body is uptight because she has not been able to sleep.   Requesting call back to follow up ASAP.

## 2021-08-01 MED ORDER — ZOLPIDEM TARTRATE 10 MG PO TABS: 5.0000 mg | ORAL_TABLET | Freq: Every evening | ORAL | 0 refills | Status: DC | PRN

## 2021-08-01 NOTE — Telephone Encounter (Signed)
I would recommend she come in to see someone to discuss options-again not sure if a muscle relaxer is the best option for her

## 2021-08-01 NOTE — Telephone Encounter (Signed)
Notified pt with MD response. Pt  states she just went back in her zolpidem 10 mg 1/2-1 tablet at night. Does not want triazolam. She states she been having this problem since her 30's, and then she is on so much medicine, Just need something to make her relax so she can sleep..Took off triazolam added back zolpidem...[name]

## 2021-08-01 NOTE — Telephone Encounter (Signed)
Yes she is taking for sleep... She actually need a prior authorization. I will proceed.Marland KitchenJohny Chess

## 2021-08-03 ENCOUNTER — Ambulatory Visit: Payer: PPO | Admitting: Internal Medicine

## 2021-09-02 ENCOUNTER — Other Ambulatory Visit: Payer: Self-pay | Admitting: Internal Medicine

## 2021-09-07 ENCOUNTER — Ambulatory Visit: Payer: PPO | Admitting: Emergency Medicine

## 2021-09-08 ENCOUNTER — Encounter: Payer: Self-pay | Admitting: Internal Medicine

## 2021-09-08 ENCOUNTER — Ambulatory Visit (INDEPENDENT_AMBULATORY_CARE_PROVIDER_SITE_OTHER): Payer: PPO | Admitting: Internal Medicine

## 2021-09-08 VITALS — BP 122/70 | HR 60 | Temp 98.0°F | Ht 63.0 in | Wt 179.0 lb

## 2021-09-08 DIAGNOSIS — G4700 Insomnia, unspecified: Secondary | ICD-10-CM | POA: Diagnosis not present

## 2021-09-08 DIAGNOSIS — R21 Rash and other nonspecific skin eruption: Secondary | ICD-10-CM

## 2021-09-08 DIAGNOSIS — Z111 Encounter for screening for respiratory tuberculosis: Secondary | ICD-10-CM | POA: Diagnosis not present

## 2021-09-08 MED ORDER — HYDROXYZINE PAMOATE 25 MG PO CAPS: 25.0000 mg | ORAL_CAPSULE | Freq: Four times a day (QID) | ORAL | 0 refills | Status: DC | PRN

## 2021-09-08 MED ORDER — METHYLPREDNISOLONE 4 MG PO TBPK: ORAL_TABLET | ORAL | 0 refills | Status: DC

## 2021-09-08 MED ORDER — TRIAMCINOLONE ACETONIDE 0.1 % EX CREA: 1.0000 | TOPICAL_CREAM | Freq: Four times a day (QID) | CUTANEOUS | 3 refills | Status: AC

## 2021-09-08 MED ORDER — METHYLPREDNISOLONE ACETATE 80 MG/ML IJ SUSP
80.0000 mg | Freq: Once | INTRAMUSCULAR | Status: AC
  Administered 2021-09-08: 80 mg via INTRAMUSCULAR

## 2021-09-08 MED ORDER — CETIRIZINE HCL 10 MG PO TABS: 10.0000 mg | ORAL_TABLET | Freq: Every day | ORAL | 3 refills | Status: DC

## 2021-09-08 NOTE — Assessment & Plan Note (Signed)
New hives/urticaria Depo-medrol IM 80 mg Medrol pack Rx Hydroxyzine po prn Zyrtec 10 mg/d Triamc cream

## 2021-09-08 NOTE — Addendum Note (Signed)
Addended by: Marijean Heath R on: 09/08/2021 08:58 AM   Modules accepted: Orders

## 2021-09-08 NOTE — Addendum Note (Signed)
Addended by: Marijean Heath R on: 09/08/2021 08:53 AM   Modules accepted: Orders

## 2021-09-08 NOTE — Progress Notes (Signed)
Subjective:  Patient ID: Deanna Salazar, female    DOB: [birth year]  Age: 81 y.o. MRN: [id number]  CC: Rash (Arms,stomach,face and mostly everywhere)   HPI [employer] presents for rash on the trunk, face, arms - very itchy. Can't sleep. It started 2 weeks ago. No new meds, foods, detergent etc  Outpatient Medications Prior to Visit  Medication Sig Dispense Refill   acetaminophen (TYLENOL) 500 MG tablet Take 500-1,000 mg by mouth every 6 (six) hours as needed for mild pain, moderate pain, fever or headache.     amLODipine (NORVASC) 10 MG tablet TAKE 1 TABLET BY MOUTH EVERY DAY 90 tablet 1   aspirin (ASPIRIN CHILDRENS) 81 MG chewable tablet Chew 1 tablet (81 mg total) by mouth daily. 36 tablet 11   Blood Glucose Monitoring Suppl (ONE TOUCH ULTRA 2) w/Device KIT Use to check blood sugars daily 1 kit 0   brimonidine (ALPHAGAN) 0.2 % ophthalmic solution Place 1 drop into both eyes in the morning and at bedtime.     carvedilol (COREG) 25 MG tablet TAKE 1 TABLET(25 MG) BY MOUTH TWICE DAILY WITH A MEAL 180 tablet 1   cholecalciferol (VITAMIN D) 1000 units tablet Take 1,000 Units by mouth daily.     diclofenac Sodium (VOLTAREN) 1 % GEL Apply 2 g topically 4 (four) times daily.     famotidine (PEPCID) 40 MG tablet 1 tablet at bedtime. 1 tablet at bedtime.     folic acid (FOLVITE) 1 MG tablet Take 1 tablet daily     furosemide (LASIX) 20 MG tablet TAKE 1 TO 2 TABLETS(20 TO 40 MG) BY MOUTH DAILY AS NEEDED FOR SWELLING (Patient taking differently: Take 10 mg by mouth daily as needed for edema or fluid. Takes at least twice weekly) 60 tablet 5   glucose blood (ONETOUCH ULTRA) test strip TEST BLOOD SUGAR LEVELS DAILY 100 strip 3   ketotifen (ZADITOR) 0.025 % ophthalmic solution Place 1 drop into both eyes 2 (two) times daily.     Lancets (ONETOUCH DELICA PLUS [id number]) MISC USE TO TEST BLOOD SUGAR LEVELS 100 each 3   latanoprost (XALATAN) 0.005 % ophthalmic solution Place 1 drop into both eyes at  bedtime.  3   levothyroxine (SYNTHROID) 25 MCG tablet TAKE 1 TABLET BY MOUTH ONCE DAILY BEFORE BREAKFAST 90 tablet 2   Lidocaine 4 % PTCH Apply 1 patch topically daily. 12 hours on and 12 hours off     meclizine (ANTIVERT) 25 MG tablet TAKE 1 TABLET BY MOUTH THREE TIMES DAILY AS NEEDED FOR DIZZINESS OR NAUSEA 30 tablet 3   metFORMIN (GLUCOPHAGE) 500 MG tablet TAKE 1 TABLET(500 MG) BY MOUTH TWICE DAILY WITH A MEAL 180 tablet 3   methotrexate 2.5 MG tablet Take 20 mg by mouth once a week.     Multiple Vitamins-Minerals (PRESERVISION AREDS 2+MULTI VIT PO) Take 1 capsule by mouth in the morning and at bedtime.     Polyethyl Glycol-Propyl Glycol (SYSTANE OP) Apply 1 drop to eye as needed.     Polyethylene Glycol 3350 (MIRALAX PO) Take by mouth.     potassium chloride (KLOR-CON M) 10 MEQ tablet Take 1 tablet (10 mEq total) by mouth daily. 90 tablet 3   SHINGRIX injection      zolpidem (AMBIEN) 10 MG tablet Take 0.5-1 tablets (5-10 mg total) by mouth at bedtime as needed for sleep. 30 tablet 0   No facility-administered medications prior to visit.    ROS: Review of Systems  Constitutional:  Negative for activity change, appetite change, chills, fatigue and unexpected weight change.  HENT:  Negative for congestion, mouth sores and sinus pressure.   Eyes:  Negative for visual disturbance.  Respiratory:  Negative for cough and chest tightness.   Gastrointestinal:  Negative for abdominal pain and nausea.  Genitourinary:  Negative for difficulty urinating, frequency and vaginal pain.  Musculoskeletal:  Negative for back pain and gait problem.  Skin:  Positive for rash. Negative for pallor.  Neurological:  Negative for dizziness, tremors, weakness, numbness and headaches.  Psychiatric/Behavioral:  Positive for sleep disturbance. Negative for confusion.     Objective:  BP 122/70 (BP Location: Right Arm, Patient Position: Sitting, Cuff Size: Large)   Pulse 60   Temp 98 F (36.7 C) (Oral)   Ht 5'  3" (1.6 m)   Wt 179 lb (81.2 kg)   SpO2 95%   BMI 31.71 kg/m   BP Readings from Last 3 Encounters:  09/08/21 122/70  07/27/21 120/64  05/04/21 132/62    Wt Readings from Last 3 Encounters:  09/08/21 179 lb (81.2 kg)  07/27/21 179 lb 2 oz (81.3 kg)  05/04/21 179 lb 6.4 oz (81.4 kg)    Physical Exam Constitutional:      General: She is not in acute distress.    Appearance: She is well-developed. She is obese.  HENT:     Head: Normocephalic.     Right Ear: External ear normal.     Left Ear: External ear normal.     Nose: Nose normal.  Eyes:     General:        Right eye: No discharge.        Left eye: No discharge.     Conjunctiva/sclera: Conjunctivae normal.     Pupils: Pupils are equal, round, and reactive to light.  Neck:     Thyroid: No thyromegaly.     Vascular: No JVD.     Trachea: No tracheal deviation.  Cardiovascular:     Rate and Rhythm: Normal rate and regular rhythm.     Heart sounds: Normal heart sounds.  Pulmonary:     Effort: No respiratory distress.     Breath sounds: No stridor. No wheezing.  Abdominal:     General: Bowel sounds are normal. There is no distension.     Palpations: Abdomen is soft. There is no mass.     Tenderness: There is no abdominal tenderness. There is no guarding or rebound.  Musculoskeletal:        General: No tenderness.     Cervical back: Normal range of motion and neck supple. No rigidity.  Lymphadenopathy:     Cervical: No cervical adenopathy.  Skin:    Findings: No erythema or rash.  Neurological:     Cranial Nerves: No cranial nerve deficit.     Motor: No abnormal muscle tone.     Coordination: Coordination normal.     Deep Tendon Reflexes: Reflexes normal.  Psychiatric:        Behavior: Behavior normal.        Thought Content: Thought content normal.        Judgment: Judgment normal.   Eryth papules/hives 1x2 cm scattered on trunk, arms  Lab Results  Component Value Date   WBC 6.0 02/23/2021   HGB 11.6 (L)  02/23/2021   HCT 36.1 02/23/2021   PLT 214.0 02/23/2021   GLUCOSE 103 (H) 07/27/2021   CHOL 187 08/17/2020   TRIG 132.0 08/17/2020   HDL 46.00  08/17/2020   LDLDIRECT 180.9 07/26/2009   LDLCALC 114 (H) 08/17/2020   ALT 10 07/27/2021   AST 19 07/27/2021   NA 140 07/27/2021   K 4.2 07/27/2021   CL 104 07/27/2021   CREATININE 1.11 07/27/2021   BUN 15 07/27/2021   CO2 28 07/27/2021   TSH 1.81 02/23/2021   HGBA1C 5.8 07/27/2021   MICROALBUR <0.7 02/23/2020    MM DIAG BREAST TOMO UNI RIGHT  Result Date: 04/25/2021 CLINICAL DATA:  81 year old female recalled from screening mammogram dated 04/03/2021 for a possible right breast asymmetry. EXAM: DIGITAL DIAGNOSTIC UNILATERAL RIGHT MAMMOGRAM WITH TOMOSYNTHESIS AND CAD TECHNIQUE: Right digital diagnostic mammography and breast tomosynthesis was performed. The images were evaluated with computer-aided detection. COMPARISON:  Previous exam(s). ACR Breast Density Category b: There are scattered areas of fibroglandular density. FINDINGS: An asymmetry in the central subareolar right breast on the CC projection resolves into well dispersed fibroglandular tissue on today's additional views. No suspicious findings identified. IMPRESSION: No mammographic evidence of malignancy. RECOMMENDATION: Screening mammogram in one year.(Code:SM-B-01Y) I have discussed the findings and recommendations with the patient. If applicable, a reminder letter will be sent to the patient regarding the next appointment. BI-RADS CATEGORY  1: Negative. Electronically Signed   By: Kristopher Oppenheim M.D.   On: 04/25/2021 09:16   Assessment & Plan:   Problem List Items Addressed This Visit     INSOMNIA, PERSISTENT    Worse due to hives Hydroxyzine should help      Rash in adult    New hives/urticaria Depo-medrol IM 80 mg Medrol pack Rx Hydroxyzine po prn Zyrtec 10 mg/d Triamc cream          Meds ordered this encounter  Medications   methylPREDNISolone (MEDROL  DOSEPAK) 4 MG TBPK tablet    Sig: As directed    Dispense:  21 tablet    Refill:  0   cetirizine (ZYRTEC ALLERGY) 10 MG tablet    Sig: Take 1 tablet (10 mg total) by mouth daily.    Dispense:  30 tablet    Refill:  3   hydrOXYzine (VISTARIL) 25 MG capsule    Sig: Take 1 capsule (25 mg total) by mouth every 6 (six) hours as needed for itching (insomnia, hives).    Dispense:  100 capsule    Refill:  0   triamcinolone cream (KENALOG) 0.1 %    Sig: Apply 1 Application topically 4 (four) times daily.    Dispense:  80 g    Refill:  3      Follow-up: Return in about 2 weeks (around 09/22/2021) for a follow-up visit.  Walker Kehr, MD

## 2021-09-08 NOTE — Assessment & Plan Note (Signed)
Worse due to hives Hydroxyzine should help

## 2021-09-14 LAB — QUANTIFERON-TB GOLD PLUS
Mitogen-NIL: 6.99 IU/mL
NIL: 0.03 IU/mL
QuantiFERON-TB Gold Plus: NEGATIVE
TB1-NIL: 0 IU/mL
TB2-NIL: 0 IU/mL

## 2021-09-18 DIAGNOSIS — Z6832 Body mass index (BMI) 32.0-32.9, adult: Secondary | ICD-10-CM | POA: Diagnosis not present

## 2021-09-18 DIAGNOSIS — M25561 Pain in right knee: Secondary | ICD-10-CM | POA: Diagnosis not present

## 2021-09-18 DIAGNOSIS — M1711 Unilateral primary osteoarthritis, right knee: Secondary | ICD-10-CM | POA: Diagnosis not present

## 2021-09-18 DIAGNOSIS — E669 Obesity, unspecified: Secondary | ICD-10-CM | POA: Diagnosis not present

## 2021-09-18 DIAGNOSIS — M0609 Rheumatoid arthritis without rheumatoid factor, multiple sites: Secondary | ICD-10-CM | POA: Diagnosis not present

## 2021-09-26 ENCOUNTER — Ambulatory Visit
Admission: EM | Admit: 2021-09-26 | Discharge: 2021-09-26 | Disposition: A | Discharge status: Home / Self Care | Payer: PPO | Attending: Physician Assistant | Admitting: Physician Assistant

## 2021-09-26 DIAGNOSIS — T7840XA Allergy, unspecified, initial encounter: Secondary | ICD-10-CM | POA: Diagnosis not present

## 2021-09-26 DIAGNOSIS — R220 Localized swelling, mass and lump, head: Secondary | ICD-10-CM

## 2021-09-26 MED ORDER — DIPHENHYDRAMINE HCL 25 MG PO CAPS
25.0000 mg | ORAL_CAPSULE | Freq: Four times a day (QID) | ORAL | Status: DC | PRN
  Administered 2021-09-26: 25 mg via ORAL

## 2021-09-26 MED ORDER — PREDNISONE 10 MG PO TABS: 10.0000 mg | ORAL_TABLET | Freq: Three times a day (TID) | ORAL | 0 refills | Status: DC

## 2021-09-26 MED ORDER — TRIAMCINOLONE ACETONIDE 40 MG/ML IJ SUSP
40.0000 mg | Freq: Once | INTRAMUSCULAR | Status: AC
  Administered 2021-09-26: 40 mg via INTRAMUSCULAR

## 2021-09-26 MED ORDER — LORATADINE 10 MG PO TABS: 10.0000 mg | ORAL_TABLET | Freq: Every day | ORAL | 0 refills | Status: DC

## 2021-09-26 MED ORDER — DIPHENHYDRAMINE HCL 12.5 MG PO CHEW: 12.5000 mg | CHEWABLE_TABLET | Freq: Three times a day (TID) | ORAL | 0 refills | Status: DC

## 2021-09-26 NOTE — ED Triage Notes (Signed)
Pt c/o swollen tongue with tingling feeling. Said she called per dr's office and they told her to come here.   Onset: This morning

## 2021-09-26 NOTE — Discharge Instructions (Signed)
Advised to take Benadryl every 6-8 hours as needed for the tongue swelling. Advised take the Claritin 10 mg daily until the tongue swelling resolves. Advised take prednisone 10 mg 1 3 times a day for 3 days only as this will reduce the tongue swelling and return things to normal. Advised to follow-up with PCP or report to the emergency room if the tongue swelling, difficulty swallowing, shortness of breath occurs.

## 2021-09-26 NOTE — ED Provider Notes (Addendum)
[name]    CSN: [id number] Arrival date & time: 09/26/21  0959      History   Chief Complaint Chief Complaint  Patient presents with   Oral Swelling    HPI Deanna Salazar is a 81 y.o. female.   81 year old female presents with tongue swelling.  Patient indicates that last night after eating ladies cheese and vinegar chips she started having tongue swelling later that evening and mild difficulty swallowing.  She relates that the symptoms continued this morning when she got up and she was continued to have difficulty swallowing and the tongue swelling.  Relates she did not have any difficulty breathing, no wheezing or shortness of breath.  She relates when getting here to the urgent care her symptoms seem to improve.  She relates she does not have any difficulty swallowing but she does relate the tongue is still somewhat swollen.  She relates the potato chips are the only new thing that she introduced into her diet, her medications are the same, as she has not drank any new fluids.  She denies any fever, nausea or vomiting.     Past Medical History:  Diagnosis Date   Abdominal pain, generalized 02/05/2008   ABDOMINAL PAIN-EPIGASTRIC 03/24/2009   Acute bronchitis 02/15/2007   Adenomatous colon polyp 04/2000   Arthritis    ASCUS favor benign 10/2011   negative high risk HPV screen   BACK PAIN 07/20/2008   Breast cancer ([place]) 2009   l, hx of XRT Dr Benay Spice, Left side   BREAST CANCER, HX OF 04/16/2007   CHEST PAIN 02/23/2010   COLONIC POLYPS, ADENOMATOUS, HX OF 10/01/2007   CONSTIPATION, CHRONIC 05/10/2007   Dysuria 04/06/2008   FATIGUE 12/26/2006   Fatty liver    FATTY LIVER DISEASE 11/19/2006   FIBROMYALGIA 11/19/2006   GERD 11/19/2006   Dr Fuller Plan   Glaucoma 07/2017   left eye   HEMATOCHEZIA 05/10/2007   HEMORRHOIDS, INTERNAL 05/10/2007   HIP PAIN 07/20/2008   HYPERLIPIDEMIA 07/26/2009   HYPERTENSION 12/26/2006   HYPOKALEMIA 11/25/2007   HYPOTHYROIDISM 12/26/2006    IBS 11/19/2006   INSOMNIA, PERSISTENT 12/26/2006   KELOID 07/25/2007   MURMUR 07/25/2007   Nausea alone 03/23/2010   PARESTHESIA 04/06/2008   Personal history of radiation therapy    RENAL CYST, LEFT 02/23/2010   Rheumatoid arthritis(714.0) 11/19/2006   Dr Vianne Bulls   RUQ PAIN 11/07/2009   Tubular adenoma    VISION IMPAIRMENT, LOW VISION, ONE EYE-LEFT 07/25/2007    Patient Active Problem List   Diagnosis Date Noted   Rash in adult 09/08/2021   Headache 07/27/2021   Weight loss 02/23/2021   CRF (chronic renal failure), stage 3 (moderate) ([place]) 02/25/2020   Nightmares 04/22/2019   Exposure to COVID-19 virus 10/09/2018   Cough 10/09/2018   Grief 11/23/2016   Unilateral primary osteoarthritis, right knee 08/23/2016   Hypokalemia 05/16/2016   Rhonchi 02/09/2016   Intertrigo 10/04/2015   Allergic rhinitis 04/05/2015   Well adult exam 06/28/2014   Tinea pedis 10/13/2013   Near syncope 09/28/2013   LBP (low back pain) 09/24/2012   Diabetes type 2, controlled ([place]) 11/06/2011   Breast cancer ([place]) 11/06/2011   URI, acute 03/02/2011   Flank pain 03/02/2011   Hyperglycemia 03/02/2011   Edema 10/30/2010   Dyspnea and respiratory abnormalities 10/30/2010   Cramp in limb 10/30/2010   Bronchitis 10/06/2010   NAUSEA ALONE 03/23/2010   RENAL CYST, LEFT 02/23/2010   Chest pain 02/23/2010   Dyslipidemia 07/26/2009  ABDOMINAL PAIN-EPIGASTRIC 03/24/2009   HIP PAIN 07/20/2008   BACK PAIN 07/20/2008   FEVER, NOS 07/20/2008   PARESTHESIA 04/06/2008   Dysuria 04/06/2008   Dysphagia, pharyngoesophageal phase 02/05/2008   ABDOMINAL PAIN, GENERALIZED 02/05/2008   HYPOKALEMIA 11/25/2007   Depression 11/25/2007   COLONIC POLYPS, ADENOMATOUS, HX OF 10/01/2007   VISION IMPAIRMENT, LOW VISION, ONE EYE-LEFT 07/25/2007   KELOID 07/25/2007   MURMUR 07/25/2007   HEMORRHOIDS, INTERNAL 05/10/2007   CONSTIPATION, CHRONIC 05/10/2007   BREAST CANCER, HX OF 04/16/2007   ACUTE BRONCHITIS 02/15/2007    Hypothyroidism 12/26/2006   INSOMNIA, PERSISTENT 12/26/2006   Essential hypertension 12/26/2006   Chronic fatigue 12/26/2006   GERD 11/19/2006   IBS 11/19/2006   FATTY LIVER DISEASE 11/19/2006   Rheumatoid arthritis ([place]) 11/19/2006   Fibromyalgia 11/19/2006    Past Surgical History:  Procedure Laterality Date   BREAST LUMPECTOMY Left 2009   BREAST SURGERY  2009   Left lumpectomy   COLONOSCOPY  06/12/2012   Cyst excision from chest     knee surgery Left    Left thyroidectomy     OS Cataract extraction  2009   OVARIAN CYST REMOVAL  age 63   SHOULDER SURGERY     both shoulders   TOE SURGERY     TUBAL LIGATION      OB History     Gravida  5   Para  5   Term      Preterm      AB      Living  5      SAB      IAB      Ectopic      Multiple      Live Births               Home Medications    Prior to Admission medications   Medication Sig Start Date End Date Taking? Authorizing Provider  diphenhydrAMINE (BENADRYL) 12.5 MG chewable tablet Chew 1 tablet (12.5 mg total) by mouth in the morning, at noon, and at bedtime. 09/26/21  Yes Nyoka Lint, PA-C  loratadine (CLARITIN) 10 MG tablet Take 1 tablet (10 mg total) by mouth daily. For tongue swelling. 09/26/21  Yes Nyoka Lint, PA-C  predniSONE (DELTASONE) 10 MG tablet Take 1 tablet (10 mg total) by mouth 3 (three) times daily. 09/26/21  Yes Nyoka Lint, PA-C  acetaminophen (TYLENOL) 500 MG tablet Take 500-1,000 mg by mouth every 6 (six) hours as needed for mild pain, moderate pain, fever or headache.    [provider]  amLODipine (NORVASC) 10 MG tablet TAKE 1 TABLET BY MOUTH EVERY DAY 09/04/21   Plotnikov, Evie Lacks, MD  aspirin (ASPIRIN CHILDRENS) 81 MG chewable tablet Chew 1 tablet (81 mg total) by mouth daily. 12/06/14   Plotnikov, Evie Lacks, MD  Blood Glucose Monitoring Suppl (ONE TOUCH ULTRA 2) w/Device KIT Use to check blood sugars daily 10/28/19   Plotnikov, Evie Lacks, MD  brimonidine  (ALPHAGAN) 0.2 % ophthalmic solution Place 1 drop into both eyes in the morning and at bedtime. 04/21/19   [provider]  carvedilol (COREG) 25 MG tablet TAKE 1 TABLET(25 MG) BY MOUTH TWICE DAILY WITH A MEAL 03/27/21   Plotnikov, Evie Lacks, MD  cetirizine (ZYRTEC ALLERGY) 10 MG tablet Take 1 tablet (10 mg total) by mouth daily. 09/08/21 09/08/22  Plotnikov, Evie Lacks, MD  cholecalciferol (VITAMIN D) 1000 units tablet Take 1,000 Units by mouth daily.    [provider]  diclofenac  Sodium (VOLTAREN) 1 % GEL Apply 2 g topically 4 (four) times daily.    [provider]  famotidine (PEPCID) 40 MG tablet 1 tablet at bedtime. 1 tablet at bedtime.    [provider]  folic acid (FOLVITE) 1 MG tablet Take 1 tablet daily    [provider]  furosemide (LASIX) 20 MG tablet TAKE 1 TO 2 TABLETS(20 TO 40 MG) BY MOUTH DAILY AS NEEDED FOR SWELLING Patient taking differently: Take 10 mg by mouth daily as needed for edema or fluid. Takes at least twice weekly 07/14/20   Plotnikov, Evie Lacks, MD  glucose blood (ONETOUCH ULTRA) test strip TEST BLOOD SUGAR LEVELS DAILY 10/28/20   Plotnikov, Evie Lacks, MD  hydrOXYzine (VISTARIL) 25 MG capsule Take 1 capsule (25 mg total) by mouth every 6 (six) hours as needed for itching (insomnia, hives). 09/08/21   Plotnikov, Evie Lacks, MD  ketotifen (ZADITOR) 0.025 % ophthalmic solution Place 1 drop into both eyes 2 (two) times daily.    [provider]  Lancets (ONETOUCH DELICA PLUS [id number]) MISC USE TO TEST BLOOD SUGAR LEVELS 09/23/20   Plotnikov, Evie Lacks, MD  latanoprost (XALATAN) 0.005 % ophthalmic solution Place 1 drop into both eyes at bedtime. 07/08/17   [provider]  levothyroxine (SYNTHROID) 25 MCG tablet TAKE 1 TABLET BY MOUTH ONCE DAILY BEFORE BREAKFAST 11/14/20   Plotnikov, Evie Lacks, MD  Lidocaine 4 % PTCH Apply 1 patch topically daily. 12 hours on and 12 hours off    [provider]  meclizine (ANTIVERT)  25 MG tablet TAKE 1 TABLET BY MOUTH THREE TIMES DAILY AS NEEDED FOR DIZZINESS OR NAUSEA 11/17/20   Plotnikov, Evie Lacks, MD  metFORMIN (GLUCOPHAGE) 500 MG tablet TAKE 1 TABLET(500 MG) BY MOUTH TWICE DAILY WITH A MEAL 02/20/21   Plotnikov, Evie Lacks, MD  methotrexate 2.5 MG tablet Take 20 mg by mouth once a week. 07/15/19   [provider]  methylPREDNISolone (MEDROL DOSEPAK) 4 MG TBPK tablet As directed 09/08/21   Plotnikov, Evie Lacks, MD  Multiple Vitamins-Minerals (PRESERVISION AREDS 2+MULTI VIT PO) Take 1 capsule by mouth in the morning and at bedtime.    [provider]  Polyethyl Glycol-Propyl Glycol (SYSTANE OP) Apply 1 drop to eye as needed.    [provider]  Polyethylene Glycol 3350 (MIRALAX PO) Take by mouth.    [provider]  potassium chloride (KLOR-CON M) 10 MEQ tablet Take 1 tablet (10 mEq total) by mouth daily. 02/26/21   Plotnikov, Evie Lacks, MD  [facility] injection  04/29/21   [provider]  triamcinolone cream (KENALOG) 0.1 % Apply 1 Application topically 4 (four) times daily. 09/08/21   Plotnikov, Evie Lacks, MD  zolpidem (AMBIEN) 10 MG tablet Take 0.5-1 tablets (5-10 mg total) by mouth at bedtime as needed for sleep. 08/01/21   Plotnikov, Evie Lacks, MD    Family History Family History  Problem Relation Age of Onset   Lung cancer Brother    Colon cancer Brother 72       50's   Diabetes Father    Brain cancer Brother    Stroke Sister    Breast cancer Neg Hx     Social History Social History   Tobacco Use   Smoking status: Never   Smokeless tobacco: Never  Vaping Use   Vaping Use: Never used  Substance Use Topics   Alcohol use: No    Alcohol/week: 0.0 standard drinks of alcohol   Drug use: No  Allergies   Codeine, Hydrocodone, Hydrocodone-acetaminophen, Lovastatin, Pantoprazole, Pepcid [famotidine], Telmisartan, Tramadol hcl, and Trazodone and nefazodone   Review of Systems Review of Systems  HENT:  Positive for  facial swelling (tongue).      Physical Exam Triage Vital Signs ED Triage Vitals  Enc Vitals Group     BP 09/26/21 1059 (!) 150/81     Pulse Rate 09/26/21 1059 62     Resp --      Temp 09/26/21 1059 97.8 F (36.6 C)     Temp Source 09/26/21 1059 Oral     SpO2 09/26/21 1059 97 %     Weight --      Height --      Head Circumference --      Peak Flow --      Pain Score 09/26/21 1057 3     Pain Loc --      Pain Edu? --      Excl. in [place]? --    No data found.  Updated Vital Signs BP (!) 150/81 (BP Location: Right Arm)   Pulse 62   Temp 97.8 F (36.6 C) (Oral)   SpO2 97%   Visual Acuity Right Eye Distance:   Left Eye Distance:   Bilateral Distance:    Right Eye Near:   Left Eye Near:    Bilateral Near:     Physical Exam Constitutional:      Appearance: Normal appearance.  HENT:     Right Ear: Tympanic membrane and ear canal normal.     Left Ear: Tympanic membrane and ear canal normal.     Mouth/Throat:     Mouth: Mucous membranes are moist.     Pharynx: Oropharynx is clear.     Comments: Mouth: Tongue is swollen without redness, 1+.  Oropharynx is normal without noted swelling or redness. Cardiovascular:     Rate and Rhythm: Normal rate and regular rhythm.     Heart sounds: Normal heart sounds.  Pulmonary:     Effort: Pulmonary effort is normal.     Breath sounds: Normal breath sounds and air entry. No wheezing, rhonchi or rales.  Lymphadenopathy:     Cervical: No cervical adenopathy.  Neurological:     Mental Status: She is alert.      UC Treatments / Results  Labs (all labs ordered are listed, but only abnormal results are displayed) Labs Reviewed - No data to display  EKG   Radiology No results found.  Procedures Procedures (including critical care time)  Medications Ordered in UC Medications  diphenhydrAMINE (BENADRYL) capsule 25 mg (25 mg Oral Given 09/26/21 1112)  triamcinolone acetonide (KENALOG-40) injection 40 mg (40 mg Intramuscular  Given 09/26/21 1113)    Initial Impression / Assessment and Plan / UC Course  I have reviewed the triage vital signs and the nursing notes.  Pertinent labs & imaging results that were available during my care of the patient were reviewed by me and considered in my medical decision making (see chart for details).       Plan: 1 advised take Benadryl every 6-8 hours as needed for the tongue swelling. 2.  Advised to take Claritin 10 mg daily until the tongue swelling resolves. 3.  Advised to take prednisone 10 mg every 8 hours until completed as this will help reduce the tongue swelling. 4.  Advised to follow-up PCP or to the emergency room if symptoms fail to improve or worsen over the next 48 to 72 hours. Final Clinical  Impressions(s) / UC Diagnoses   Final diagnoses:  Allergic reaction, initial encounter  Mild tongue swelling     Discharge Instructions      Advised to take Benadryl every 6-8 hours as needed for the tongue swelling. Advised take the Claritin 10 mg daily until the tongue swelling resolves. Advised take prednisone 10 mg 1 3 times a day for 3 days only as this will reduce the tongue swelling and return things to normal. Advised to follow-up with PCP or report to the emergency room if the tongue swelling, difficulty swallowing, shortness of breath occurs.     ED Prescriptions     Medication Sig Dispense Auth. Provider   predniSONE (DELTASONE) 10 MG tablet Take 1 tablet (10 mg total) by mouth 3 (three) times daily. 10 tablet Nyoka Lint, PA-C   loratadine (CLARITIN) 10 MG tablet Take 1 tablet (10 mg total) by mouth daily. For tongue swelling. 7 tablet Nyoka Lint, PA-C   diphenhydrAMINE (BENADRYL) 12.5 MG chewable tablet Chew 1 tablet (12.5 mg total) by mouth in the morning, at noon, and at bedtime. 30 tablet Nyoka Lint, PA-C      PDMP not reviewed this encounter.   Nyoka Lint, PA-C 09/26/21 1126    Nyoka Lint, PA-C 09/26/21 1133

## 2021-09-28 ENCOUNTER — Encounter: Payer: Self-pay | Admitting: Internal Medicine

## 2021-09-28 ENCOUNTER — Ambulatory Visit (INDEPENDENT_AMBULATORY_CARE_PROVIDER_SITE_OTHER): Payer: PPO | Admitting: Internal Medicine

## 2021-09-28 VITALS — BP 144/70 | HR 67 | Temp 98.9°F | Ht 63.0 in | Wt 184.0 lb

## 2021-09-28 DIAGNOSIS — Z23 Encounter for immunization: Secondary | ICD-10-CM

## 2021-09-28 DIAGNOSIS — I10 Essential (primary) hypertension: Secondary | ICD-10-CM

## 2021-09-28 DIAGNOSIS — E876 Hypokalemia: Secondary | ICD-10-CM | POA: Diagnosis not present

## 2021-09-28 DIAGNOSIS — R220 Localized swelling, mass and lump, head: Secondary | ICD-10-CM

## 2021-09-28 DIAGNOSIS — E119 Type 2 diabetes mellitus without complications: Secondary | ICD-10-CM

## 2021-09-28 DIAGNOSIS — T783XXD Angioneurotic edema, subsequent encounter: Secondary | ICD-10-CM

## 2021-09-28 DIAGNOSIS — R21 Rash and other nonspecific skin eruption: Secondary | ICD-10-CM | POA: Diagnosis not present

## 2021-09-28 DIAGNOSIS — T783XXA Angioneurotic edema, initial encounter: Secondary | ICD-10-CM | POA: Insufficient documentation

## 2021-09-28 MED ORDER — DIPHENHYDRAMINE HCL 25 MG PO TABS: 25.0000 mg | ORAL_TABLET | ORAL | 1 refills | Status: DC | PRN

## 2021-09-28 MED ORDER — EPINEPHRINE 0.3 MG/0.3ML IJ SOAJ: 0.3000 mg | INTRAMUSCULAR | 2 refills | Status: DC | PRN

## 2021-09-28 MED ORDER — PREDNISONE 10 MG PO TABS: ORAL_TABLET | ORAL | 1 refills | Status: DC

## 2021-09-28 NOTE — Assessment & Plan Note (Signed)
Up due to steroids BP Readings from Last 3 Encounters:  09/28/21 (!) 144/70  09/26/21 (!) 150/81  09/08/21 122/70

## 2021-09-28 NOTE — Assessment & Plan Note (Addendum)
S/p food allergy vs other - pt ate potato chips w/chadder. Finishing Prednisone  Benadryl 50 mg Epi-pen Prednisone - Take 50 mg on Day #1 prn allergic reaction. Take 30 mg on Day #2.Take 20 mg on Day #3. Then stop

## 2021-09-28 NOTE — Assessment & Plan Note (Signed)
9/23   S/p food allergy vs other - pt ate potato chips w/chadder. Finishing Prednisone  Benadryl 50 mg Epi-pen Prednisone - Take 50 mg on Day #1 prn allergic reaction. Take 30 mg on Day #2.Take 20 mg on Day #3. Then stop

## 2021-09-28 NOTE — Assessment & Plan Note (Signed)
Monitor A1c 

## 2021-09-28 NOTE — Assessment & Plan Note (Signed)
Prednisone - Take 50 mg on Day #1 prn allergic reaction. Take 30 mg on Day #2.Take 20 mg on Day #3. Then stop

## 2021-09-28 NOTE — Assessment & Plan Note (Signed)
S/p food allergy vs other - pt ate potato chips w/chadder. Finishing Prednisone  Benadryl 50 mg Epi-pen Prednisone '50mg'$ - 30-20

## 2021-09-28 NOTE — Progress Notes (Signed)
Subjective:  Patient ID: Deanna Salazar, female    DOB: Feb 10, 1940  Age: 81 y.o. MRN: [id number]  CC: Follow-up (2 week f/u)   HPI [facility] [employer] presents for a recent allergic reaction, HTN, DM  Outpatient Medications Prior to Visit  Medication Sig Dispense Refill   acetaminophen (TYLENOL) 500 MG tablet Take 500-1,000 mg by mouth every 6 (six) hours as needed for mild pain, moderate pain, fever or headache.     amLODipine (NORVASC) 10 MG tablet TAKE 1 TABLET BY MOUTH EVERY DAY 90 tablet 1   aspirin (ASPIRIN CHILDRENS) 81 MG chewable tablet Chew 1 tablet (81 mg total) by mouth daily. 36 tablet 11   Blood Glucose Monitoring Suppl (ONE TOUCH ULTRA 2) w/Device KIT Use to check blood sugars daily 1 kit 0   brimonidine (ALPHAGAN) 0.2 % ophthalmic solution Place 1 drop into both eyes in the morning and at bedtime.     carvedilol (COREG) 25 MG tablet TAKE 1 TABLET(25 MG) BY MOUTH TWICE DAILY WITH A MEAL 180 tablet 1   cetirizine (ZYRTEC ALLERGY) 10 MG tablet Take 1 tablet (10 mg total) by mouth daily. 30 tablet 3   cholecalciferol (VITAMIN D) 1000 units tablet Take 1,000 Units by mouth daily.     diclofenac Sodium (VOLTAREN) 1 % GEL Apply 2 g topically 4 (four) times daily.     diphenhydrAMINE (BENADRYL) 12.5 MG chewable tablet Chew 1 tablet (12.5 mg total) by mouth in the morning, at noon, and at bedtime. 30 tablet 0   famotidine (PEPCID) 40 MG tablet 1 tablet at bedtime. 1 tablet at bedtime.     folic acid (FOLVITE) 1 MG tablet Take 1 tablet daily     furosemide (LASIX) 20 MG tablet TAKE 1 TO 2 TABLETS(20 TO 40 MG) BY MOUTH DAILY AS NEEDED FOR SWELLING (Patient taking differently: Take 10 mg by mouth daily as needed for edema or fluid. Takes at least twice weekly) 60 tablet 5   glucose blood (ONETOUCH ULTRA) test strip TEST BLOOD SUGAR LEVELS DAILY 100 strip 3   hydrOXYzine (VISTARIL) 25 MG capsule Take 1 capsule (25 mg total) by mouth every 6 (six) hours as needed for itching (insomnia,  hives). 100 capsule 0   ketotifen (ZADITOR) 0.025 % ophthalmic solution Place 1 drop into both eyes 2 (two) times daily.     Lancets (ONETOUCH DELICA PLUS [id number]) MISC USE TO TEST BLOOD SUGAR LEVELS 100 each 3   latanoprost (XALATAN) 0.005 % ophthalmic solution Place 1 drop into both eyes at bedtime.  3   levothyroxine (SYNTHROID) 25 MCG tablet TAKE 1 TABLET BY MOUTH ONCE DAILY BEFORE BREAKFAST 90 tablet 2   Lidocaine 4 % PTCH Apply 1 patch topically daily. 12 hours on and 12 hours off     loratadine (CLARITIN) 10 MG tablet Take 1 tablet (10 mg total) by mouth daily. For tongue swelling. 7 tablet 0   meclizine (ANTIVERT) 25 MG tablet TAKE 1 TABLET BY MOUTH THREE TIMES DAILY AS NEEDED FOR DIZZINESS OR NAUSEA 30 tablet 3   metFORMIN (GLUCOPHAGE) 500 MG tablet TAKE 1 TABLET(500 MG) BY MOUTH TWICE DAILY WITH A MEAL 180 tablet 3   methotrexate 2.5 MG tablet Take 20 mg by mouth once a week.     Multiple Vitamins-Minerals (PRESERVISION AREDS 2+MULTI VIT PO) Take 1 capsule by mouth in the morning and at bedtime.     Polyethyl Glycol-Propyl Glycol (SYSTANE OP) Apply 1 drop to eye as needed.  Polyethylene Glycol 3350 (MIRALAX PO) Take by mouth.     potassium chloride (KLOR-CON M) 10 MEQ tablet Take 1 tablet (10 mEq total) by mouth daily. 90 tablet 3   SHINGRIX injection      triamcinolone cream (KENALOG) 0.1 % Apply 1 Application topically 4 (four) times daily. 80 g 3   zolpidem (AMBIEN) 10 MG tablet Take 0.5-1 tablets (5-10 mg total) by mouth at bedtime as needed for sleep. 30 tablet 0   predniSONE (DELTASONE) 10 MG tablet Take 1 tablet (10 mg total) by mouth 3 (three) times daily. 10 tablet 0   methylPREDNISolone (MEDROL DOSEPAK) 4 MG TBPK tablet As directed (Patient not taking: Reported on 09/28/2021) 21 tablet 0   No facility-administered medications prior to visit.    ROS: Review of Systems  Constitutional:  Positive for unexpected weight change. Negative for activity change, appetite  change, chills and fatigue.  HENT:  Negative for congestion, mouth sores, sinus pressure, sore throat and trouble swallowing.   Eyes:  Negative for visual disturbance.  Respiratory:  Negative for cough, chest tightness, wheezing and stridor.   Cardiovascular:  Negative for leg swelling.  Gastrointestinal:  Negative for abdominal pain and nausea.  Genitourinary:  Negative for difficulty urinating, frequency and vaginal pain.  Musculoskeletal:  Negative for back pain and gait problem.  Skin:  Negative for pallor and rash.  Neurological:  Negative for dizziness, tremors, weakness, numbness and headaches.  Psychiatric/Behavioral:  Negative for confusion and sleep disturbance.     Objective:  BP (!) 144/70 (BP Location: Left Arm)   Pulse 67   Temp 98.9 F (37.2 C) (Oral)   Ht _0  (1.6 m)   Wt 184 lb (83.5 kg)   SpO2 97%   BMI 32.59 kg/m   BP Readings from Last 3 Encounters:  09/28/21 (!) 144/70  09/26/21 (!) 150/81  09/08/21 122/70    Wt Readings from Last 3 Encounters:  09/28/21 184 lb (83.5 kg)  09/08/21 179 lb (81.2 kg)  07/27/21 179 lb 2 oz (81.3 kg)    Physical Exam Constitutional:      General: She is not in acute distress.    Appearance: She is well-developed.  HENT:     Head: Normocephalic.     Right Ear: External ear normal.     Left Ear: External ear normal.     Nose: Nose normal.  Eyes:     General:        Right eye: No discharge.        Left eye: No discharge.     Conjunctiva/sclera: Conjunctivae normal.     Pupils: Pupils are equal, round, and reactive to light.  Neck:     Thyroid: No thyromegaly.     Vascular: No JVD.     Trachea: No tracheal deviation.  Cardiovascular:     Rate and Rhythm: Normal rate and regular rhythm.     Heart sounds: Normal heart sounds.  Pulmonary:     Effort: No respiratory distress.     Breath sounds: No stridor. No wheezing.  Abdominal:     General: Bowel sounds are normal. There is no distension.     Palpations:  Abdomen is soft. There is no mass.     Tenderness: There is no abdominal tenderness. There is no guarding or rebound.  Musculoskeletal:        General: No tenderness.     Cervical back: Normal range of motion and neck supple. No rigidity.  Lymphadenopathy:  Cervical: No cervical adenopathy.  Skin:    Findings: No erythema or rash.  Neurological:     Mental Status: She is oriented to person, place, and time.     Cranial Nerves: No cranial nerve deficit.     Motor: No abnormal muscle tone.     Coordination: Coordination normal.     Deep Tendon Reflexes: Reflexes normal.  Psychiatric:        Behavior: Behavior normal.        Thought Content: Thought content normal.        Judgment: Judgment normal.     Lab Results  Component Value Date   WBC 6.0 02/23/2021   HGB 11.6 (L) 02/23/2021   HCT 36.1 02/23/2021   PLT 214.0 02/23/2021   GLUCOSE 103 (H) 07/27/2021   CHOL 187 08/17/2020   TRIG 132.0 08/17/2020   HDL 46.00 08/17/2020   LDLDIRECT 180.9 07/26/2009   LDLCALC 114 (H) 08/17/2020   ALT 10 07/27/2021   AST 19 07/27/2021   NA 140 07/27/2021   K 4.2 07/27/2021   CL 104 07/27/2021   CREATININE 1.11 07/27/2021   BUN 15 07/27/2021   CO2 28 07/27/2021   TSH 1.81 02/23/2021   HGBA1C 5.8 07/27/2021   MICROALBUR <0.7 02/23/2020    No results found.  Assessment & Plan:   Problem List Items Addressed This Visit     Angioedema    9/23   S/p food allergy vs other - pt ate potato chips w/chadder. Finishing Prednisone  Benadryl 50 mg Epi-pen Prednisone - Take 50 mg on Day #1 prn allergic reaction. Take 30 mg on Day #2.Take 20 mg on Day #3. Then stop      Diabetes type 2, controlled ([place])    Monitor A1c      Essential hypertension    Up due to steroids BP Readings from Last 3 Encounters:  09/28/21 (!) 144/70  09/26/21 (!) 150/81  09/08/21 122/70         Relevant Medications   EPINEPHrine (EPIPEN 2-PAK) 0.3 mg/0.3 mL IJ SOAJ injection   Hypokalemia     KCl  - dissolve in juce: KCL gives the pt cramps Can't take it      Lip swelling     S/p food allergy vs other - pt ate potato chips w/chadder. Finishing Prednisone  Benadryl 50 mg Epi-pen Prednisone - Take 50 mg on Day #1 prn allergic reaction. Take 30 mg on Day #2.Take 20 mg on Day #3. Then stop      Rash in adult    Prednisone - Take 50 mg on Day #1 prn allergic reaction. Take 30 mg on Day #2.Take 20 mg on Day #3. Then stop      Tongue swelling     S/p food allergy vs other - pt ate potato chips w/chadder. Finishing Prednisone  Benadryl 50 mg Epi-pen Prednisone 91m- 30-20       Other Visit Diagnoses     Needs flu shot    -  Primary   Relevant Orders   Flu Vaccine QUAD High Dose(Fluad) (Completed)         Meds ordered this encounter  Medications   predniSONE (DELTASONE) 10 MG tablet    Sig: Take 50 mg on Day #1 prn allergic reaction. Take 30 mg on Day #2.Take 20 mg on Day #3. Then stop    Dispense:  10 tablet    Refill:  1   diphenhydrAMINE (BENADRYL ALLERGY) 25 MG tablet  Sig: Take 1 tablet (25 mg total) by mouth every 4 (four) hours as needed for allergies or itching (allergic reaction).    Dispense:  30 tablet    Refill:  1   EPINEPHrine (EPIPEN 2-PAK) 0.3 mg/0.3 mL IJ SOAJ injection    Sig: Inject 0.3 mg into the muscle as needed for anaphylaxis.    Dispense:  1 each    Refill:  2      Follow-up: No follow-ups on file.  Walker Kehr, MD

## 2021-09-28 NOTE — Assessment & Plan Note (Signed)
KCl - dissolve in juce: KCL gives the pt cramps Can't take it

## 2021-10-10 ENCOUNTER — Telehealth: Payer: PPO

## 2021-10-12 ENCOUNTER — Other Ambulatory Visit: Payer: Self-pay | Admitting: Internal Medicine

## 2021-10-20 ENCOUNTER — Encounter: Payer: Self-pay | Admitting: Internal Medicine

## 2021-10-20 ENCOUNTER — Other Ambulatory Visit: Payer: Self-pay | Admitting: Internal Medicine

## 2021-10-20 DIAGNOSIS — H0288A Meibomian gland dysfunction right eye, upper and lower eyelids: Secondary | ICD-10-CM | POA: Diagnosis not present

## 2021-10-20 DIAGNOSIS — H401111 Primary open-angle glaucoma, right eye, mild stage: Secondary | ICD-10-CM | POA: Diagnosis not present

## 2021-10-20 DIAGNOSIS — H1045 Other chronic allergic conjunctivitis: Secondary | ICD-10-CM | POA: Diagnosis not present

## 2021-10-20 DIAGNOSIS — H209 Unspecified iridocyclitis: Secondary | ICD-10-CM | POA: Diagnosis not present

## 2021-10-20 DIAGNOSIS — H0288B Meibomian gland dysfunction left eye, upper and lower eyelids: Secondary | ICD-10-CM | POA: Diagnosis not present

## 2021-10-20 DIAGNOSIS — E119 Type 2 diabetes mellitus without complications: Secondary | ICD-10-CM | POA: Diagnosis not present

## 2021-10-20 DIAGNOSIS — Z961 Presence of intraocular lens: Secondary | ICD-10-CM | POA: Diagnosis not present

## 2021-10-20 DIAGNOSIS — H401122 Primary open-angle glaucoma, left eye, moderate stage: Secondary | ICD-10-CM | POA: Diagnosis not present

## 2021-10-20 LAB — HM DIABETES EYE EXAM

## 2021-10-30 ENCOUNTER — Ambulatory Visit (INDEPENDENT_AMBULATORY_CARE_PROVIDER_SITE_OTHER): Payer: PPO | Admitting: Internal Medicine

## 2021-10-30 ENCOUNTER — Encounter: Payer: Self-pay | Admitting: Internal Medicine

## 2021-10-30 VITALS — BP 130/75 | HR 59 | Temp 98.2°F | Ht 63.0 in | Wt 182.8 lb

## 2021-10-30 DIAGNOSIS — E119 Type 2 diabetes mellitus without complications: Secondary | ICD-10-CM

## 2021-10-30 DIAGNOSIS — M79672 Pain in left foot: Secondary | ICD-10-CM | POA: Diagnosis not present

## 2021-10-30 DIAGNOSIS — I10 Essential (primary) hypertension: Secondary | ICD-10-CM | POA: Diagnosis not present

## 2021-10-30 LAB — COMPREHENSIVE METABOLIC PANEL
ALT: 12 U/L (ref 0–35)
AST: 19 U/L (ref 0–37)
Albumin: 4.2 g/dL (ref 3.5–5.2)
Alkaline Phosphatase: 76 U/L (ref 39–117)
BUN: 13 mg/dL (ref 6–23)
CO2: 29 mEq/L (ref 19–32)
Calcium: 10.1 mg/dL (ref 8.4–10.5)
Chloride: 104 mEq/L (ref 96–112)
Creatinine, Ser: 1.04 mg/dL (ref 0.40–1.20)
GFR: 50.17 mL/min — ABNORMAL LOW (ref >60.00)
Glucose, Bld: 96 mg/dL (ref 70–99)
Potassium: 3.7 mEq/L (ref 3.5–5.1)
Sodium: 140 mEq/L (ref 135–145)
Total Bilirubin: 0.6 mg/dL (ref 0.2–1.2)
Total Protein: 7 g/dL (ref 6.0–8.3)

## 2021-10-30 LAB — TSH: TSH: 3.03 u[IU]/mL (ref 0.35–5.50)

## 2021-10-30 LAB — HEMOGLOBIN A1C: Hgb A1c MFr Bld: 5.9 % (ref 4.6–6.5)

## 2021-10-30 MED ORDER — ZOLPIDEM TARTRATE ER 6.25 MG PO TBCR: 6.2500 mg | EXTENDED_RELEASE_TABLET | Freq: Every evening | ORAL | 5 refills | Status: DC | PRN

## 2021-10-30 NOTE — Assessment & Plan Note (Signed)
Re- checked BP - nl

## 2021-10-30 NOTE — Assessment & Plan Note (Signed)
Podiatry ref L foot w/pain - callus, crack on the 5th digit Triamc cream bid

## 2021-10-30 NOTE — Progress Notes (Signed)
Subjective:  Patient ID: Deanna Salazar, female    DOB: 08-19-1940  Age: 81 y.o. MRN: [id number]  CC: Follow-up (3 month f/u)   HPI [name] presents for insomnia, nightmares on Vistaril F/u DM, HTN  C/oL foot pain  Outpatient Medications Prior to Visit  Medication Sig Dispense Refill   acetaminophen (TYLENOL) 500 MG tablet Take 500-1,000 mg by mouth every 6 (six) hours as needed for mild pain, moderate pain, fever or headache.     amLODipine (NORVASC) 10 MG tablet TAKE 1 TABLET BY MOUTH EVERY DAY 90 tablet 1   aspirin (ASPIRIN CHILDRENS) 81 MG chewable tablet Chew 1 tablet (81 mg total) by mouth daily. 36 tablet 11   Blood Glucose Monitoring Suppl (ONE TOUCH ULTRA 2) w/Device KIT Use to check blood sugars daily 1 kit 0   brimonidine (ALPHAGAN) 0.2 % ophthalmic solution Place 1 drop into both eyes in the morning and at bedtime.     carvedilol (COREG) 25 MG tablet TAKE 1 TABLET(25 MG) BY MOUTH TWICE DAILY WITH A MEAL 180 tablet 1   cetirizine (ZYRTEC ALLERGY) 10 MG tablet Take 1 tablet (10 mg total) by mouth daily. 30 tablet 3   cholecalciferol (VITAMIN D) 1000 units tablet Take 1,000 Units by mouth daily.     diclofenac Sodium (VOLTAREN) 1 % GEL Apply 2 g topically 4 (four) times daily.     diphenhydrAMINE (BENADRYL ALLERGY) 25 MG tablet Take 1 tablet (25 mg total) by mouth every 4 (four) hours as needed for allergies or itching (allergic reaction). 30 tablet 1   diphenhydrAMINE (BENADRYL) 12.5 MG chewable tablet Chew 1 tablet (12.5 mg total) by mouth in the morning, at noon, and at bedtime. 30 tablet 0   EPINEPHrine (EPIPEN 2-PAK) 0.3 mg/0.3 mL IJ SOAJ injection Inject 0.3 mg into the muscle as needed for anaphylaxis. 1 each 2   famotidine (PEPCID) 40 MG tablet 1 tablet at bedtime. 1 tablet at bedtime.     folic acid (FOLVITE) 1 MG tablet Take 1 tablet daily     furosemide (LASIX) 20 MG tablet TAKE 1 TO 2 TABLETS(20 TO 40 MG) BY MOUTH DAILY AS NEEDED FOR SWELLING (Patient  taking differently: Take 10 mg by mouth daily as needed for edema or fluid. Takes at least twice weekly) 60 tablet 5   glucose blood (ONETOUCH ULTRA) test strip TEST BLOOD SUGAR LEVELS DAILY 100 strip 3   ketotifen (ZADITOR) 0.025 % ophthalmic solution Place 1 drop into both eyes 2 (two) times daily.     Lancets (ONETOUCH DELICA PLUS [id number]) MISC USE TO TEST BLOOD SUGAR LEVELS 100 each 3   latanoprost (XALATAN) 0.005 % ophthalmic solution Place 1 drop into both eyes at bedtime.  3   levothyroxine (SYNTHROID) 25 MCG tablet TAKE 1 TABLET BY MOUTH EVERY DAY BEFORE BREAKFAST 90 tablet 2   Lidocaine 4 % PTCH Apply 1 patch topically daily. 12 hours on and 12 hours off     loratadine (CLARITIN) 10 MG tablet Take 1 tablet (10 mg total) by mouth daily. For tongue swelling. 7 tablet 0   meclizine (ANTIVERT) 25 MG tablet TAKE 1 TABLET BY MOUTH THREE TIMES DAILY AS NEEDED FOR DIZZINESS OR NAUSEA 30 tablet 3   metFORMIN (GLUCOPHAGE) 500 MG tablet TAKE 1 TABLET(500 MG) BY MOUTH TWICE DAILY WITH A MEAL 180 tablet 3   methotrexate 2.5 MG tablet Take 20 mg by mouth once a week.     Multiple Vitamins-Minerals (PRESERVISION AREDS 2+MULTI VIT  PO) Take 1 capsule by mouth in the morning and at bedtime.     Polyethyl Glycol-Propyl Glycol (SYSTANE OP) Apply 1 drop to eye as needed.     Polyethylene Glycol 3350 (MIRALAX PO) Take by mouth.     potassium chloride (KLOR-CON M) 10 MEQ tablet Take 1 tablet (10 mEq total) by mouth daily. 90 tablet 3   predniSONE (DELTASONE) 10 MG tablet Take 50 mg on Day #1 prn allergic reaction. Take 30 mg on Day #2.Take 20 mg on Day #3. Then stop 10 tablet 1   triamcinolone cream (KENALOG) 0.1 % Apply 1 Application topically 4 (four) times daily. 80 g 3   hydrOXYzine (VISTARIL) 25 MG capsule Take 1 capsule (25 mg total) by mouth every 6 (six) hours as needed for itching (insomnia, hives). 100 capsule 0   zolpidem (AMBIEN) 10 MG tablet Take 0.5-1 tablets (5-10 mg total) by mouth at bedtime  as needed for sleep. 30 tablet 0   SHINGRIX injection  (Patient not taking: Reported on 10/30/2021)     No facility-administered medications prior to visit.    ROS: Review of Systems  Constitutional:  Negative for activity change, appetite change, chills, fatigue and unexpected weight change.  HENT:  Negative for congestion, mouth sores and sinus pressure.   Eyes:  Negative for visual disturbance.  Respiratory:  Negative for cough and chest tightness.   Gastrointestinal:  Negative for abdominal pain and nausea.  Genitourinary:  Negative for difficulty urinating, frequency and vaginal pain.  Musculoskeletal:  Positive for arthralgias, back pain and gait problem.  Skin:  Positive for wound. Negative for pallor and rash.  Neurological:  Negative for dizziness, tremors, weakness, numbness and headaches.  Psychiatric/Behavioral:  Positive for sleep disturbance. Negative for confusion and suicidal ideas. The patient is nervous/anxious.     Objective:  BP 130/75   Pulse (!) 59   Temp 98.2 F (36.8 C) (Oral)   Ht 5' 3"  (1.6 m)   Wt 182 lb 12.8 oz (82.9 kg)   SpO2 97%   BMI 32.38 kg/m   BP Readings from Last 3 Encounters:  10/30/21 130/75  09/28/21 (!) 144/70  09/26/21 (!) 150/81    Wt Readings from Last 3 Encounters:  10/30/21 182 lb 12.8 oz (82.9 kg)  09/28/21 184 lb (83.5 kg)  09/08/21 179 lb (81.2 kg)    Physical Exam Constitutional:      General: She is not in acute distress.    Appearance: She is well-developed. She is obese.  HENT:     Head: Normocephalic.     Right Ear: External ear normal.     Left Ear: External ear normal.     Nose: Nose normal.  Eyes:     General:        Right eye: No discharge.        Left eye: No discharge.     Conjunctiva/sclera: Conjunctivae normal.     Pupils: Pupils are equal, round, and reactive to light.  Neck:     Thyroid: No thyromegaly.     Vascular: No JVD.     Trachea: No tracheal deviation.  Cardiovascular:     Rate and  Rhythm: Normal rate and regular rhythm.     Heart sounds: Normal heart sounds.  Pulmonary:     Effort: No respiratory distress.     Breath sounds: No stridor. No wheezing.  Abdominal:     General: Bowel sounds are normal. There is no distension.     Palpations:  Abdomen is soft. There is no mass.     Tenderness: There is no abdominal tenderness. There is no guarding or rebound.  Musculoskeletal:        General: No tenderness.     Cervical back: Normal range of motion and neck supple. No rigidity.  Lymphadenopathy:     Cervical: No cervical adenopathy.  Skin:    Findings: No erythema or rash.  Neurological:     Cranial Nerves: No cranial nerve deficit.     Motor: No abnormal muscle tone.     Coordination: Coordination normal.     Deep Tendon Reflexes: Reflexes normal.  Psychiatric:        Behavior: Behavior normal.        Thought Content: Thought content normal.        Judgment: Judgment normal.   L foot w/pain - callus, crack on the 5th digit  Lab Results  Component Value Date   WBC 6.0 02/23/2021   HGB 11.6 (L) 02/23/2021   HCT 36.1 02/23/2021   PLT 214.0 02/23/2021   GLUCOSE 103 (H) 07/27/2021   CHOL 187 08/17/2020   TRIG 132.0 08/17/2020   HDL 46.00 08/17/2020   LDLDIRECT 180.9 07/26/2009   LDLCALC 114 (H) 08/17/2020   ALT 10 07/27/2021   AST 19 07/27/2021   NA 140 07/27/2021   K 4.2 07/27/2021   CL 104 07/27/2021   CREATININE 1.11 07/27/2021   BUN 15 07/27/2021   CO2 28 07/27/2021   TSH 1.81 02/23/2021   HGBA1C 5.8 07/27/2021   MICROALBUR <0.7 02/23/2020    No results found.  Assessment & Plan:   Problem List Items Addressed This Visit     Diabetes type 2, controlled ([place])   Relevant Orders   Ambulatory referral to Podiatry   TSH   Comprehensive metabolic panel   Hemoglobin A1c   Essential hypertension    Re- checked BP - nl      Relevant Orders   TSH   Comprehensive metabolic panel   Hemoglobin A1c   Foot pain, left - Primary    Podiatry  ref L foot w/pain - callus, crack on the 5th digit Triamc cream bid      Relevant Orders   Ambulatory referral to Podiatry   TSH   Comprehensive metabolic panel   Hemoglobin A1c      Meds ordered this encounter  Medications   zolpidem (AMBIEN CR) 6.25 MG CR tablet    Sig: Take 1-2 tablets (6.25-12.5 mg total) by mouth at bedtime as needed for sleep.    Dispense:  60 tablet    Refill:  5      Follow-up: Return in about 4 months (around 03/02/2022) for a follow-up visit.  Walker Kehr, MD

## 2021-10-31 ENCOUNTER — Telehealth: Payer: Self-pay | Admitting: Internal Medicine

## 2021-10-31 NOTE — Telephone Encounter (Signed)
Patient needs a new prescription of the [place] sent in.  Dr. Alain Marion changed the dosage and patient was unable to get it filled without a new rx.  Please send to [facility] on [address].

## 2021-11-02 MED ORDER — ZOLPIDEM TARTRATE ER 6.25 MG PO TBCR: 6.2500 mg | EXTENDED_RELEASE_TABLET | Freq: Every evening | ORAL | 5 refills | Status: DC | PRN

## 2021-11-02 NOTE — Telephone Encounter (Signed)
Called insurance she states med is does'nt need a PA. The insurance will only cover 30 for 30 days. Need rx resent for 30 tabs

## 2021-11-02 NOTE — Telephone Encounter (Signed)
It was sent to [facility].  I will send it again.  Thanks

## 2021-11-05 MED ORDER — ZOLPIDEM TARTRATE ER 12.5 MG PO TBCR: 12.5000 mg | EXTENDED_RELEASE_TABLET | Freq: Every evening | ORAL | 5 refills | Status: DC | PRN

## 2021-11-05 NOTE — Telephone Encounter (Signed)
Prescription was resent for 30.  I increase the dose to zolpidem CR 12.5 mg due to quantity limit.

## 2021-11-05 NOTE — Addendum Note (Signed)
Addended by: Cassandria Anger on: 11/05/2021 11:16 PM   Modules accepted: Orders

## 2021-11-07 ENCOUNTER — Ambulatory Visit: Payer: PPO | Admitting: Podiatry

## 2021-11-07 ENCOUNTER — Other Ambulatory Visit: Payer: Self-pay | Admitting: Internal Medicine

## 2021-11-07 DIAGNOSIS — D492 Neoplasm of unspecified behavior of bone, soft tissue, and skin: Secondary | ICD-10-CM

## 2021-11-07 DIAGNOSIS — D2371 Other benign neoplasm of skin of right lower limb, including hip: Secondary | ICD-10-CM

## 2021-11-07 MED ORDER — KETOCONAZOLE 2 % EX CREA: 1.0000 | TOPICAL_CREAM | Freq: Every day | CUTANEOUS | 0 refills | Status: DC

## 2021-11-07 NOTE — Progress Notes (Signed)
Subjective:   Patient ID: Deanna Salazar, female   DOB: 81 y.o.   MRN: [id number]   HPI Chief Complaint  Patient presents with   Plantar Warts    Left foot plantar warts, started 10 years ago, callus on the tip of the 3rd left toe, numbness burning in the 3rd and 4th toes, Nail fungus    81 year old female presents the office above concerns.  Feet stay dry-has tried Vaseline but it did not work.  Nails are all dark and thicker- no treatment, just OTC medicaiton  She says the previous she still she had a pinched nerve in her foot she had surgery for this but it did not help her foot pain over the callus/skin lesion.  No recent injuries. Review of Systems  All other systems reviewed and are negative.  Past Medical History:  Diagnosis Date   Abdominal pain, generalized 02/05/2008   ABDOMINAL PAIN-EPIGASTRIC 03/24/2009   Acute bronchitis 02/15/2007   Adenomatous colon polyp 04/2000   Arthritis    ASCUS favor benign 10/2011   negative high risk HPV screen   BACK PAIN 07/20/2008   Breast cancer ([place]) 2009   l, hx of XRT Dr Benay Spice, Left side   BREAST CANCER, HX OF 04/16/2007   CHEST PAIN 02/23/2010   COLONIC POLYPS, ADENOMATOUS, HX OF 10/01/2007   CONSTIPATION, CHRONIC 05/10/2007   Dysuria 04/06/2008   FATIGUE 12/26/2006   Fatty liver    FATTY LIVER DISEASE 11/19/2006   FIBROMYALGIA 11/19/2006   GERD 11/19/2006   Dr Fuller Plan   Glaucoma 07/2017   left eye   HEMATOCHEZIA 05/10/2007   HEMORRHOIDS, INTERNAL 05/10/2007   HIP PAIN 07/20/2008   HYPERLIPIDEMIA 07/26/2009   HYPERTENSION 12/26/2006   HYPOKALEMIA 11/25/2007   HYPOTHYROIDISM 12/26/2006   IBS 11/19/2006   INSOMNIA, PERSISTENT 12/26/2006   KELOID 07/25/2007   MURMUR 07/25/2007   Nausea alone 03/23/2010   PARESTHESIA 04/06/2008   Personal history of radiation therapy    RENAL CYST, LEFT 02/23/2010   Rheumatoid arthritis(714.0) 11/19/2006   Dr Vianne Bulls   RUQ PAIN 11/07/2009   Tubular adenoma    VISION IMPAIRMENT, LOW VISION, ONE  EYE-LEFT 07/25/2007    Past Surgical History:  Procedure Laterality Date   BREAST LUMPECTOMY Left 2009   BREAST SURGERY  2009   Left lumpectomy   COLONOSCOPY  06/12/2012   Cyst excision from chest     knee surgery Left    Left thyroidectomy     OS Cataract extraction  2009   OVARIAN CYST REMOVAL  age 89   SHOULDER SURGERY     both shoulders   TOE SURGERY     TUBAL LIGATION       Current Outpatient Medications:    ketoconazole (NIZORAL) 2 % cream, Apply 1 Application topically daily., Disp: 60 g, Rfl: 0   acetaminophen (TYLENOL) 500 MG tablet, Take 500-1,000 mg by mouth every 6 (six) hours as needed for mild pain, moderate pain, fever or headache., Disp: , Rfl:    amLODipine (NORVASC) 10 MG tablet, TAKE 1 TABLET BY MOUTH EVERY DAY, Disp: 90 tablet, Rfl: 1   aspirin (ASPIRIN CHILDRENS) 81 MG chewable tablet, Chew 1 tablet (81 mg total) by mouth daily., Disp: 36 tablet, Rfl: 11   Blood Glucose Monitoring Suppl (ONE TOUCH ULTRA 2) w/Device KIT, Use to check blood sugars daily, Disp: 1 kit, Rfl: 0   brimonidine (ALPHAGAN) 0.2 % ophthalmic solution, Place 1 drop into both eyes in the morning and at bedtime., Disp: ,  Rfl:    carvedilol (COREG) 25 MG tablet, TAKE 1 TABLET(25 MG) BY MOUTH TWICE DAILY WITH A MEAL, Disp: 180 tablet, Rfl: 1   cetirizine (ZYRTEC ALLERGY) 10 MG tablet, Take 1 tablet (10 mg total) by mouth daily., Disp: 30 tablet, Rfl: 3   cholecalciferol (VITAMIN D) 1000 units tablet, Take 1,000 Units by mouth daily., Disp: , Rfl:    diclofenac Sodium (VOLTAREN) 1 % GEL, Apply 2 g topically 4 (four) times daily., Disp: , Rfl:    famotidine (PEPCID) 40 MG tablet, 1 tablet at bedtime. 1 tablet at bedtime., Disp: , Rfl:    folic acid (FOLVITE) 1 MG tablet, Take 1 tablet daily, Disp: , Rfl:    furosemide (LASIX) 20 MG tablet, TAKE 1 TO 2 TABLETS(20 TO 40 MG) BY MOUTH DAILY AS NEEDED FOR SWELLING (Patient taking differently: Take 10 mg by mouth daily as needed for edema or fluid. Takes  at least twice weekly), Disp: 60 tablet, Rfl: 5   glucose blood (ONETOUCH ULTRA) test strip, TEST BLOOD SUGAR LEVELS DAILY, Disp: 100 strip, Rfl: 3   ketotifen (ZADITOR) 0.025 % ophthalmic solution, Place 1 drop into both eyes 2 (two) times daily., Disp: , Rfl:    Lancets (ONETOUCH DELICA PLUS SLHTDS28J) MISC, USE TO TEST BLOOD SUGAR LEVELS, Disp: 100 each, Rfl: 3   latanoprost (XALATAN) 0.005 % ophthalmic solution, Place 1 drop into both eyes at bedtime., Disp: , Rfl: 3   levothyroxine (SYNTHROID) 25 MCG tablet, TAKE 1 TABLET BY MOUTH EVERY DAY BEFORE BREAKFAST, Disp: 90 tablet, Rfl: 2   Lidocaine 4 % PTCH, Apply 1 patch topically daily. 12 hours on and 12 hours off, Disp: , Rfl:    meclizine (ANTIVERT) 25 MG tablet, TAKE 1 TABLET BY MOUTH THREE TIMES DAILY AS NEEDED FOR DIZZINESS OR NAUSEA, Disp: 30 tablet, Rfl: 3   metFORMIN (GLUCOPHAGE) 500 MG tablet, TAKE 1 TABLET(500 MG) BY MOUTH TWICE DAILY WITH A MEAL, Disp: 180 tablet, Rfl: 3   methotrexate 2.5 MG tablet, Take 20 mg by mouth once a week., Disp: , Rfl:    Multiple Vitamins-Minerals (PRESERVISION AREDS 2+MULTI VIT PO), Take 1 capsule by mouth in the morning and at bedtime., Disp: , Rfl:    Polyethyl Glycol-Propyl Glycol (SYSTANE OP), Apply 1 drop to eye as needed., Disp: , Rfl:    Polyethylene Glycol 3350 (MIRALAX PO), Take by mouth., Disp: , Rfl:    potassium chloride (KLOR-CON M) 10 MEQ tablet, Take 1 tablet (10 mEq total) by mouth daily., Disp: 90 tablet, Rfl: 3   predniSONE (DELTASONE) 10 MG tablet, Take 50 mg on Day #1 prn allergic reaction. Take 30 mg on Day #2.Take 20 mg on Day #3. Then stop, Disp: 10 tablet, Rfl: 1   triamcinolone cream (KENALOG) 0.1 %, Apply 1 Application topically 4 (four) times daily., Disp: 80 g, Rfl: 3   zolpidem (AMBIEN CR) 12.5 MG CR tablet, Take 1 tablet (12.5 mg total) by mouth at bedtime as needed for sleep., Disp: 30 tablet, Rfl: 5  Allergies  Allergen Reactions   Codeine Nausea And Vomiting and Other  (See Comments)    Pt states that she is able to take on a short term basis.     Hydrocodone Nausea And Vomiting and Other (See Comments)    Pt states that she is able to take on a short term basis.     Hydrocodone-Acetaminophen     Other reaction(s): Unknown   Hydroxyzine     nightmares  Lovastatin Itching and Other (See Comments)    Reaction:  Leg cramps    Pantoprazole     ?abd pain   Pepcid [Famotidine]     ?abd pain   Telmisartan     The patient thinks that telmisartan is making her sneeze   Tramadol Hcl Itching   Trazodone And Nefazodone     Nightmares          Objective:  Physical Exam  General: AAO x3, NAD  Dermatological: Hyperkeratotic lesion submetatarsal left foot without any underlying ulceration drainage or any signs of infection.  There is no open lesions.  Nails in general are hypertrophic, dystrophic with dark discoloration all the nails.  No extension of hyperpigmentation surrounding skin.  Incurvation of the nail borders without signs of infection.  There is dry, peeling, scaly skin present to both feet.  Vascular: Dorsalis Pedis artery and Posterior Tibial artery pedal pulses are palpable bilateral with immedate capillary fill time. There is no pain with calf compression, swelling, warmth, erythema.   Neruologic: Grossly intact via light touch bilateral.   Musculoskeletal: Tenderness the skin lesion.  No areas of discomfort.  Gait: Unassisted, Nonantalgic.       Assessment:   81 year old female skin lesion left foot, onychomycosis/onychodystrophy     Plan:  -Treatment options discussed including all alternatives, risks, and complications -Etiology of symptoms were discussed -She will be debrided the skin lesion left with any complications or bleeding.  Skin skin with alcohol and Cantharone was applied followed by an occlusive bandage.  Postprocedure instruction discussed.  Monitor for any signs or symptoms of infection. -Order compound cream  through [facility] to help with the nails.  Discussed oral, topical as well as alternative treatments. -Prescribed ketoconazole for the skin. -Daily foot inspection.  Trula Slade DPM

## 2021-11-07 NOTE — Patient Instructions (Addendum)
Take dressing off in 8 hours and wash the foot with soap and water. If it is hurting or becomes uncomfortable before the 8 hours, go ahead and remove the bandage and wash the area.  If it blisters, apply antibiotic ointment and a band-aid.  Monitor for any signs/symptoms of infection. Call the office immediately if any occur or go directly to the emergency room. Call with any questions/concerns.  I have ordered a medication for you that will come from [facility] in [place]. They should be calling you to verify insurance and will mail the medication to you. If you live close by then you can go by their pharmacy to pick up the medication. Their phone number is [phone]. If you do not hear from them in the next few days, please give [place] a call at [phone].   For the skin I have sent ketoconazole to apply  Ingrown Toenail  An ingrown toenail occurs when the corner or sides of a toenail grow into the surrounding skin. This causes discomfort and pain. The big toe is most commonly affected, but any of the toes can be affected. If an ingrown toenail is not treated, it can become infected. What are the causes? This condition may be caused by: Wearing shoes that are too small or tight. An injury, such as stubbing your toe or having your toe stepped on. Improper cutting or care of your toenails. Having nail or foot abnormalities that were present from birth (congenital abnormalities), such as having a nail that is too big for your toe. What increases the risk? The following factors may make you more likely to develop ingrown toenails: Age. Nails tend to get thicker with age, so ingrown nails are more common among older people. Cutting your toenails incorrectly, such as cutting them very short or cutting them unevenly. An ingrown toenail is more likely to get infected if you have: Diabetes. Blood flow (circulation) problems. What are the signs or symptoms? Symptoms of an ingrown  toenail may include: Pain, soreness, or tenderness. Redness. Swelling. Hardening of the skin that surrounds the toenail. Signs that an ingrown toenail may be infected include: Fluid or pus. Symptoms that get worse. How is this diagnosed? Ingrown toenails may be diagnosed based on: Your symptoms and medical history. A physical exam. Labs or tests. If you have fluid or blood coming from your toenail, a sample may be collected to test for the specific type of bacteria that is causing the infection. How is this treated? Treatment depends on the severity of your symptoms. You may be able to care for your toenail at home. If you have an infection, you may be prescribed antibiotic medicines. If you have fluid or pus draining from your toenail, your health care provider may drain it. If you have trouble walking, you may be given crutches to use. If you have a severe or infected ingrown toenail, you may need a procedure to remove part or all of the nail. Follow these instructions at home: [place] your wound every day for signs of infection, or as often as told by your health care provider. Check for: More redness, swelling, or pain. More fluid or blood. Warmth. Pus or a bad smell. Do not pick at your toenail or try to remove it yourself. Soak your foot in warm, soapy water. Do this for 20 minutes, 3 times a day, or as often as told by your health care provider. This helps to keep your toe clean and  your skin soft. Wear shoes that fit well and are not too tight. Your health care provider may recommend that you wear open-toed shoes while you heal. Trim your toenails regularly and carefully. Cut your toenails straight across to prevent injury to the skin at the corners of the toenail. Do not cut your nails in a curved shape. Keep your feet clean and dry to help prevent infection. General instructions Take over-the-counter and prescription medicines only as told by your health care  provider. If you were prescribed an antibiotic, take it as told by your health care provider. Do not stop taking the antibiotic even if you start to feel better. If your health care provider told you to use crutches to help you move around, use them as instructed. Return to your normal activities as told by your health care provider. Ask your health care provider what activities are safe for you. Keep all follow-up visits. This is important. Contact a health care provider if: You have more redness, swelling, pain, or other symptoms that do not improve with treatment. You have fluid, blood, or pus coming from your toenail. You have a red streak on your skin that starts at your foot and spreads up your leg. You have a fever. Summary An ingrown toenail occurs when the corner or sides of a toenail grow into the surrounding skin. This causes discomfort and pain. The big toe is most commonly affected, but any of the toes can be affected. If an ingrown toenail is not treated, it can become infected. Fluid or pus draining from your toenail is a sign of infection. Your health care provider may need to drain it. You may be given antibiotics to treat the infection. Trimming your toenails regularly and properly can help you prevent an ingrown toenail. This information is not intended to replace advice given to you by your health care provider. Make sure you discuss any questions you have with your health care provider. Document Revised: 04/26/2020 Document Reviewed: 04/26/2020 Elsevier Patient Education  [employer].

## 2021-12-04 ENCOUNTER — Emergency Department (HOSPITAL_COMMUNITY): Payer: PPO

## 2021-12-04 ENCOUNTER — Encounter (HOSPITAL_COMMUNITY): Payer: Self-pay | Admitting: *Deleted

## 2021-12-04 ENCOUNTER — Emergency Department (HOSPITAL_COMMUNITY)
Admission: EM | Admit: 2021-12-04 | Discharge: 2021-12-04 | Disposition: A | Discharge status: Home / Self Care | Payer: PPO | Attending: Emergency Medicine | Admitting: Emergency Medicine

## 2021-12-04 ENCOUNTER — Other Ambulatory Visit: Payer: Self-pay

## 2021-12-04 DIAGNOSIS — R1013 Epigastric pain: Secondary | ICD-10-CM | POA: Diagnosis not present

## 2021-12-04 DIAGNOSIS — N281 Cyst of kidney, acquired: Secondary | ICD-10-CM | POA: Diagnosis not present

## 2021-12-04 DIAGNOSIS — Z79899 Other long term (current) drug therapy: Secondary | ICD-10-CM | POA: Insufficient documentation

## 2021-12-04 DIAGNOSIS — R112 Nausea with vomiting, unspecified: Secondary | ICD-10-CM | POA: Diagnosis not present

## 2021-12-04 DIAGNOSIS — K5909 Other constipation: Secondary | ICD-10-CM | POA: Diagnosis not present

## 2021-12-04 DIAGNOSIS — K5900 Constipation, unspecified: Secondary | ICD-10-CM | POA: Diagnosis not present

## 2021-12-04 DIAGNOSIS — I10 Essential (primary) hypertension: Secondary | ICD-10-CM | POA: Diagnosis not present

## 2021-12-04 DIAGNOSIS — Z7982 Long term (current) use of aspirin: Secondary | ICD-10-CM | POA: Diagnosis not present

## 2021-12-04 DIAGNOSIS — K219 Gastro-esophageal reflux disease without esophagitis: Secondary | ICD-10-CM | POA: Insufficient documentation

## 2021-12-04 DIAGNOSIS — R109 Unspecified abdominal pain: Secondary | ICD-10-CM

## 2021-12-04 DIAGNOSIS — N3289 Other specified disorders of bladder: Secondary | ICD-10-CM | POA: Diagnosis not present

## 2021-12-04 DIAGNOSIS — Z20822 Contact with and (suspected) exposure to covid-19: Secondary | ICD-10-CM | POA: Diagnosis not present

## 2021-12-04 DIAGNOSIS — Z7984 Long term (current) use of oral hypoglycemic drugs: Secondary | ICD-10-CM | POA: Diagnosis not present

## 2021-12-04 LAB — COMPREHENSIVE METABOLIC PANEL
ALT: 12 U/L (ref 0–44)
AST: 18 U/L (ref 15–41)
Albumin: 3.7 g/dL (ref 3.5–5.0)
Alkaline Phosphatase: 70 U/L (ref 38–126)
Anion gap: 11 (ref 5–15)
BUN: 14 mg/dL (ref 8–23)
CO2: 22 mmol/L (ref 22–32)
Calcium: 9.6 mg/dL (ref 8.9–10.3)
Chloride: 105 mmol/L (ref 98–111)
Creatinine, Ser: 0.96 mg/dL (ref 0.44–1.00)
GFR, Estimated: 59 mL/min — ABNORMAL LOW (ref >60)
Glucose, Bld: 130 mg/dL — ABNORMAL HIGH (ref 70–99)
Potassium: 3.6 mmol/L (ref 3.5–5.1)
Sodium: 138 mmol/L (ref 135–145)
Total Bilirubin: 0.7 mg/dL (ref 0.3–1.2)
Total Protein: 6.4 g/dL — ABNORMAL LOW (ref 6.5–8.1)

## 2021-12-04 LAB — URINALYSIS, ROUTINE W REFLEX MICROSCOPIC
Bilirubin Urine: NEGATIVE
Glucose, UA: NEGATIVE mg/dL
Hgb urine dipstick: NEGATIVE
Ketones, ur: NEGATIVE mg/dL
Leukocytes,Ua: NEGATIVE
Nitrite: NEGATIVE
Protein, ur: NEGATIVE mg/dL
Specific Gravity, Urine: 1.013 (ref 1.005–1.030)
pH: 5 (ref 5.0–8.0)

## 2021-12-04 LAB — CBC WITH DIFFERENTIAL/PLATELET
Abs Immature Granulocytes: 0.02 10*3/uL (ref 0.00–0.07)
Basophils Absolute: 0 10*3/uL (ref 0.0–0.1)
Basophils Relative: 0 %
Eosinophils Absolute: 0.1 10*3/uL (ref 0.0–0.5)
Eosinophils Relative: 1 %
HCT: 36.5 % (ref 36.0–46.0)
Hemoglobin: 12.2 g/dL (ref 12.0–15.0)
Immature Granulocytes: 0 %
Lymphocytes Relative: 5 %
Lymphs Abs: 0.4 10*3/uL — ABNORMAL LOW (ref 0.7–4.0)
MCH: 31.7 pg (ref 26.0–34.0)
MCHC: 33.4 g/dL (ref 30.0–36.0)
MCV: 94.8 fL (ref 80.0–100.0)
Monocytes Absolute: 0.3 10*3/uL (ref 0.1–1.0)
Monocytes Relative: 3 %
Neutro Abs: 7.5 10*3/uL (ref 1.7–7.7)
Neutrophils Relative %: 91 %
Platelets: 205 10*3/uL (ref 150–400)
RBC: 3.85 MIL/uL — ABNORMAL LOW (ref 3.87–5.11)
RDW: 13.8 % (ref 11.5–15.5)
WBC: 8.3 10*3/uL (ref 4.0–10.5)
nRBC: 0 % (ref 0.0–0.2)

## 2021-12-04 LAB — RESP PANEL BY RT-PCR (FLU A&B, COVID) ARPGX2
Influenza A by PCR: NEGATIVE
Influenza B by PCR: NEGATIVE
SARS Coronavirus 2 by RT PCR: NEGATIVE

## 2021-12-04 LAB — LIPASE, BLOOD: Lipase: 34 U/L (ref 11–51)

## 2021-12-04 LAB — TROPONIN I (HIGH SENSITIVITY)
Troponin I (High Sensitivity): 5 ng/L (ref <18)
Troponin I (High Sensitivity): 6 ng/L (ref <18)

## 2021-12-04 MED ORDER — PANTOPRAZOLE SODIUM 40 MG PO TBEC: 40.0000 mg | DELAYED_RELEASE_TABLET | Freq: Every day | ORAL | 0 refills | Status: DC

## 2021-12-04 MED ORDER — LIDOCAINE VISCOUS HCL 2 % MT SOLN
15.0000 mL | Freq: Once | OROMUCOSAL | Status: AC
  Administered 2021-12-04: 15 mL via ORAL
  Filled 2021-12-04: qty 15

## 2021-12-04 MED ORDER — ONDANSETRON 4 MG PO TBDP
4.0000 mg | ORAL_TABLET | Freq: Once | ORAL | Status: AC
  Administered 2021-12-04: 4 mg via ORAL
  Filled 2021-12-04: qty 1

## 2021-12-04 MED ORDER — ALUM & MAG HYDROXIDE-SIMETH 200-200-20 MG/5ML PO SUSP
30.0000 mL | Freq: Once | ORAL | Status: AC
  Administered 2021-12-04: 30 mL via ORAL
  Filled 2021-12-04 (×2): qty 30

## 2021-12-04 MED ORDER — IOHEXOL 350 MG/ML SOLN
75.0000 mL | Freq: Once | INTRAVENOUS | Status: AC | PRN
  Administered 2021-12-04: 75 mL via INTRAVENOUS

## 2021-12-04 MED ORDER — ONDANSETRON HCL 4 MG/2ML IJ SOLN
4.0000 mg | Freq: Three times a day (TID) | INTRAMUSCULAR | Status: DC | PRN
  Filled 2021-12-04: qty 2

## 2021-12-04 MED ORDER — FENTANYL CITRATE PF 50 MCG/ML IJ SOSY
50.0000 ug | PREFILLED_SYRINGE | Freq: Once | INTRAMUSCULAR | Status: AC
  Administered 2021-12-04: 50 ug via INTRAVENOUS
  Filled 2021-12-04: qty 1

## 2021-12-04 MED ORDER — LACTATED RINGERS IV BOLUS
1000.0000 mL | Freq: Once | INTRAVENOUS | Status: AC
  Administered 2021-12-04: 1000 mL via INTRAVENOUS

## 2021-12-04 NOTE — Discharge Instructions (Addendum)
For constipation, recommending increasing your MiraLAX dosing temporarily to include 4 capfuls of MiraLAX in a 32 ounce Gatorade, drink over 4 hours, rate can repeat the next day for an adequate bowel movement.  Additionally, recommend over-the-counter Maalox for reflux symptoms, take famotidine as previously prescribed, will initiate acid blocking medication as well.

## 2021-12-04 NOTE — ED Provider Notes (Signed)
[facility]ERG[facility] DEPARTMENT Provider Note   CSN: [id number] Arrival date & time: 12/04/21  0932     History  Chief Complaint  Patient presents with   Abdominal Pain    Deanna Salazar is a 81 y.o. female.   Abdominal Pain Associated symptoms: nausea and vomiting   Associated symptoms: no diarrhea      81 year old female with medical history significant for chronic constipation, fatty liver disease, fibromyalgia, HLD, HTN, woke up this morning with nausea and epigastric abdominal burning.  She now endorses generalized abdominal discomfort.  She has had a few episodes of N/V NB emesis, described as yellow to green.  She states her last bowel movement was yesterday and was normal.  She denies any hematemesis or hematochezia or melena.  She is not sure if she is passing gas.  She denies any fevers or chills but endorses generalized body aches.  Her abdominal pain is mostly focused in the epigastrium.  It radiates throughout her abdomen.  She denies any diarrhea or dysuria.  Home Medications Prior to Admission medications   Medication Sig Start Date End Date Taking? Authorizing Provider  pantoprazole (PROTONIX) 40 MG tablet Take 1 tablet (40 mg total) by mouth daily. 12/04/21 01/03/22 Yes Regan Lemming, MD  acetaminophen (TYLENOL) 500 MG tablet Take 500-1,000 mg by mouth every 6 (six) hours as needed for mild pain, moderate pain, fever or headache.    [provider]  amLODipine (NORVASC) 10 MG tablet TAKE 1 TABLET BY MOUTH EVERY DAY 09/04/21   Plotnikov, Evie Lacks, MD  aspirin (ASPIRIN CHILDRENS) 81 MG chewable tablet Chew 1 tablet (81 mg total) by mouth daily. 12/06/14   Plotnikov, Evie Lacks, MD  Blood Glucose Monitoring Suppl (ONE TOUCH ULTRA 2) w/Device KIT Use to check blood sugars daily 10/28/19   Plotnikov, Evie Lacks, MD  brimonidine (ALPHAGAN) 0.2 % ophthalmic solution Place 1 drop into both eyes in the morning and at bedtime. 04/21/19   [provider]  carvedilol (COREG) 25 MG tablet TAKE 1 TABLET(25 MG) BY MOUTH TWICE DAILY WITH A MEAL 11/08/21   Plotnikov, Evie Lacks, MD  cetirizine (ZYRTEC ALLERGY) 10 MG tablet Take 1 tablet (10 mg total) by mouth daily. 09/08/21 09/08/22  Plotnikov, Evie Lacks, MD  cholecalciferol (VITAMIN D) 1000 units tablet Take 1,000 Units by mouth daily.    [provider]  diclofenac Sodium (VOLTAREN) 1 % GEL Apply 2 g topically 4 (four) times daily.    [provider]  famotidine (PEPCID) 40 MG tablet 1 tablet at bedtime. 1 tablet at bedtime.    [provider]  folic acid (FOLVITE) 1 MG tablet Take 1 tablet daily    [provider]  furosemide (LASIX) 20 MG tablet TAKE 1 TO 2 TABLETS(20 TO 40 MG) BY MOUTH DAILY AS NEEDED FOR SWELLING Patient taking differently: Take 10 mg by mouth daily as needed for edema or fluid. Takes at least twice weekly 07/14/20   Plotnikov, Evie Lacks, MD  glucose blood (ONETOUCH ULTRA) test strip TEST BLOOD SUGAR LEVELS DAILY 10/28/20   Plotnikov, Evie Lacks, MD  ketoconazole (NIZORAL) 2 % cream Apply 1 Application topically daily. 11/07/21   Trula Slade, DPM  ketotifen (ZADITOR) 0.025 % ophthalmic solution Place 1 drop into both eyes 2 (two) times daily.    [provider]  Lancets (ONETOUCH DELICA PLUS TFTDDU20U) MISC USE TO TEST BLOOD SUGAR LEVELS 10/12/21   Plotnikov, Evie Lacks, MD  latanoprost (XALATAN) 0.005 %  ophthalmic solution Place 1 drop into both eyes at bedtime. 07/08/17   [provider]  levothyroxine (SYNTHROID) 25 MCG tablet TAKE 1 TABLET BY MOUTH EVERY DAY BEFORE BREAKFAST 10/20/21   Plotnikov, Evie Lacks, MD  Lidocaine 4 % PTCH Apply 1 patch topically daily. 12 hours on and 12 hours off    [provider]  meclizine (ANTIVERT) 25 MG tablet TAKE 1 TABLET BY MOUTH THREE TIMES DAILY AS NEEDED FOR DIZZINESS OR NAUSEA 11/17/20   Plotnikov, Evie Lacks, MD  metFORMIN (GLUCOPHAGE) 500 MG tablet TAKE 1 TABLET(500  MG) BY MOUTH TWICE DAILY WITH A MEAL 02/20/21   Plotnikov, Evie Lacks, MD  methotrexate 2.5 MG tablet Take 20 mg by mouth once a week. 07/15/19   [provider]  Multiple Vitamins-Minerals (PRESERVISION AREDS 2+MULTI VIT PO) Take 1 capsule by mouth in the morning and at bedtime.    [provider]  Polyethyl Glycol-Propyl Glycol (SYSTANE OP) Apply 1 drop to eye as needed.    [provider]  Polyethylene Glycol 3350 (MIRALAX PO) Take by mouth.    [provider]  potassium chloride (KLOR-CON M) 10 MEQ tablet Take 1 tablet (10 mEq total) by mouth daily. 02/26/21   Plotnikov, Evie Lacks, MD  predniSONE (DELTASONE) 10 MG tablet Take 50 mg on Day #1 prn allergic reaction. Take 30 mg on Day #2.Take 20 mg on Day #3. Then stop 09/28/21   Plotnikov, Evie Lacks, MD  triamcinolone cream (KENALOG) 0.1 % Apply 1 Application topically 4 (four) times daily. 09/08/21   Plotnikov, Evie Lacks, MD  zolpidem (AMBIEN CR) 12.5 MG CR tablet Take 1 tablet (12.5 mg total) by mouth at bedtime as needed for sleep. 11/05/21   Plotnikov, Evie Lacks, MD      Allergies    Codeine, Hydrocodone, Hydrocodone-acetaminophen, Hydroxyzine, Lovastatin, Pantoprazole, Pepcid [famotidine], Telmisartan, Tramadol hcl, and Trazodone and nefazodone    Review of Systems   Review of Systems  Gastrointestinal:  Positive for abdominal pain, nausea and vomiting. Negative for diarrhea.  All other systems reviewed and are negative.   Physical Exam Updated Vital Signs BP 132/61   Pulse 79   Temp 100.1 F (37.8 C) (Oral)   Resp (!) 22   Ht _0  (1.6 m)   Wt 82.9 kg   SpO2 96%   BMI 32.37 kg/m  Physical Exam Vitals and nursing note reviewed.  Constitutional:      General: She is not in acute distress.    Appearance: She is well-developed.  HENT:     Head: Normocephalic and atraumatic.  Eyes:     Conjunctiva/sclera: Conjunctivae normal.  Cardiovascular:     Rate and Rhythm: Normal rate and regular  rhythm.     Heart sounds: No murmur heard. Pulmonary:     Effort: Pulmonary effort is normal. No respiratory distress.     Breath sounds: Normal breath sounds.  Abdominal:     Palpations: Abdomen is soft.     Tenderness: There is generalized abdominal tenderness. There is no guarding or rebound.  Musculoskeletal:        General: No swelling.     Cervical back: Neck supple.  Skin:    General: Skin is warm and dry.     Capillary Refill: Capillary refill takes less than 2 seconds.  Neurological:     Mental Status: She is alert.  Psychiatric:        Mood and Affect: Mood normal.     ED Results / Procedures /  Treatments   Labs (all labs ordered are listed, but only abnormal results are displayed) Labs Reviewed  CBC WITH DIFFERENTIAL/PLATELET - Abnormal; Notable for the following components:      Result Value   RBC 3.85 (*)    Lymphs Abs 0.4 (*)    All other components within normal limits  COMPREHENSIVE METABOLIC PANEL - Abnormal; Notable for the following components:   Glucose, Bld 130 (*)    Total Protein 6.4 (*)    GFR, Estimated 59 (*)    All other components within normal limits  RESP PANEL BY RT-PCR (FLU A&B, COVID) ARPGX2  LIPASE, BLOOD  URINALYSIS, ROUTINE W REFLEX MICROSCOPIC  TROPONIN I (HIGH SENSITIVITY)  TROPONIN I (HIGH SENSITIVITY)    EKG EKG Interpretation  Date/Time:  Monday December 04 2021 10:36:32 EST Ventricular Rate:  71 PR Interval:  185 QRS Duration: 99 QT Interval:  389 QTC Calculation: 423 R Axis:   -54 Text Interpretation: Sinus rhythm Atrial premature complex Left anterior fascicular block Left ventricular hypertrophy Confirmed by Regan Lemming ([id number]) on 12/04/2021 10:58:01 AM  Radiology CT ABDOMEN PELVIS W CONTRAST  Result Date: 12/04/2021 CLINICAL DATA:  Epigastric pain, nausea and abdominal burning. EXAM: CT ABDOMEN AND PELVIS WITH CONTRAST TECHNIQUE: Multidetector CT imaging of the abdomen and pelvis was performed using the standard  protocol following bolus administration of intravenous contrast. RADIATION DOSE REDUCTION: This exam was performed according to the departmental dose-optimization program which includes automated exposure control, adjustment of the mA and/or kV according to patient size and/or use of iterative reconstruction technique. CONTRAST:  40m OMNIPAQUE IOHEXOL 350 MG/ML SOLN COMPARISON:  CT abdomen/pelvis 06/20/2015 FINDINGS: Lower chest: The lung bases are clear. The imaged heart is unremarkable. Hepatobiliary: The liver and gallbladder are unremarkable. There is no biliary ductal dilatation. Pancreas: There is a 1.3 cm cystic lesion in the pancreatic tail (3-29, 6-88). This was present in 2017 and is only minimally increased in size. The pancreas is otherwise unremarkable. Spleen: Unremarkable. Adrenals/Urinary Tract: The adrenals are unremarkable. Multiple bilateral renal cysts, the largest measuring up to 6.1 cm in the left upper pole, are unchanged and requiring no specific imaging follow-up. There are no suspicious renal lesions. There are no stones in either kidney or along the course of either ureter. There is symmetric excretion of contrast into the collecting systems on the delayed images. There is circumferential bladder wall thickening. Stomach/Bowel: The stomach is unremarkable. There is no evidence of bowel obstruction. There is no abnormal bowel wall thickening or inflammatory change. The appendix is not definitively identified; however, there is no pericecal inflammatory change. Vascular/Lymphatic: The abdominal aorta is normal in course and caliber. The major branch vessels are patent. The main portal and splenic veins are patent. There is no abdominal or pelvic lymphadenopathy. Reproductive: The uterus and adnexa are unremarkable. Other: There is no ascites or free air. There is a fat containing umbilical hernia, unchanged. Musculoskeletal: There is no acute osseous abnormality or suspicious osseous  lesion. There is advanced facet arthropathy at L4-L5 and L5-S1. IMPRESSION: 1. Circumferential bladder wall thickening is nonspecific but can be seen with cystitis. Recommend correlation with urinalysis. 2. Otherwise, no acute findings in the abdomen or pelvis. 3. 1.3 cm cystic lesion in the pancreatic tail was present in 2017 and has only minimally increased in size since that study, favoring a benign/indolent process. Recommend attention on any follow-up imaging of the abdomen. Electronically Signed   By: PValetta MoleM.D.   On: 12/04/2021 09:04  Procedures Procedures    Medications Ordered in ED Medications  ondansetron (ZOFRAN) injection 4 mg (has no administration in time range)  ondansetron (ZOFRAN-ODT) disintegrating tablet 4 mg (4 mg Oral Given 12/04/21 0340)  lactated ringers bolus 1,000 mL (0 mLs Intravenous Stopped 12/04/21 1021)  fentaNYL (SUBLIMAZE) injection 50 mcg (50 mcg Intravenous Given 12/04/21 0856)  iohexol (OMNIPAQUE) 350 MG/ML injection 75 mL (75 mLs Intravenous Contrast Given 12/04/21 0843)  alum & mag hydroxide-simeth (MAALOX/MYLANTA) 200-200-20 MG/5ML suspension 30 mL (30 mLs Oral Given 12/04/21 1010)    And  lidocaine (XYLOCAINE) 2 % viscous mouth solution 15 mL (15 mLs Oral Given 12/04/21 1010)    ED Course/ Medical Decision Making/ A&P                           Medical Decision Making Risk OTC drugs. Prescription drug management.    81 year old female with medical history significant for chronic constipation, fatty liver disease, fibromyalgia, HLD, HTN, woke up this morning with nausea and epigastric abdominal burning.  She now endorses generalized abdominal discomfort.  She has had a few episodes of N/V NB emesis, described as yellow to green.  She states her last bowel movement was yesterday and was normal.  She denies any hematemesis or hematochezia or melena.  She is not sure if she is passing gas.  She denies any fevers or chills but endorses generalized  body aches.  Her abdominal pain is mostly focused in the epigastrium.  It radiates throughout her abdomen.  She denies any diarrhea or dysuria.  On arrival, the patient was vitally stable, afebrile, temperature 99.5, subsequently increased to 100.1, not tachycardic P85, hemodynamically stable, saturating 95 to 97% on room air.  Physical exam significant for generalized abdominal tenderness to palpation, some focus in the epigastrium.  Differential Diagnosis: GERD, PUD, gastritis, pancreatitis, influenza, COVID-19 infection.  Considered bowel obstruction as well, cholelithiasis/cholecystitis, appendicitis, diverticulitis, UTI/pyelonephritis, constipation.  EKG: Sinus rhythm, ventricular rate 71, LVH, no acute ischemic changes  Lab results include: COVID-19 influenza PCR testing negative, CMP unremarkable, CBC without a leukocytosis or anemia, lipase normal, troponins negative, urinalysis without UTI  Imaging results include: ET abdomen pelvis: IMPRESSION:  1. Circumferential bladder wall thickening is nonspecific but can be  seen with cystitis. Recommend correlation with urinalysis.  2. Otherwise, no acute findings in the abdomen or pelvis.  3. 1.3 cm cystic lesion in the pancreatic tail was present in 2017  and has only minimally increased in size since that study, favoring  a benign/indolent process. Recommend attention on any follow-up  imaging of the abdomen.    Course of tx has consisted of: Was administered Zofran, IV fentanyl, viscous lidocaine and Maalox, IV fluid bolus with subsequent improvement in symptoms.  The patient has been appropriately medically screened and/or stabilized in the ED. I have low suspicion for any other emergent medical condition which would require further screening, evaluation or treatment in the ED or require inpatient management.   Thought process: Symptoms likely due to acute on chronic constipation.  She did have a bowel movement yesterday after taking a  Dulcolax over the weekend.  She has chronically had issues with constipation.  She takes 2 capfuls of MiraLAX.  Advised for generalized abdominal discomfort and chronic constipation, increasing her MiraLAX temporarily for improved bowel function.  Additionally, the patient's burning epigastric pain is consistent with likely GERD.  She has reported allergy/intolerance noted on her chart to Pepcid and Protonix but  is currently tolerating Pepcid outpatient.  We will add on Protonix and have her follow-up outpatient with her PCP.  Discussed the plan of care with the patient who endorsed understanding..   Final Clinical Impression(s) / ED Diagnoses Final diagnoses:  Abdominal discomfort  Chronic constipation  Gastroesophageal reflux disease, unspecified whether esophagitis present    Rx / DC Orders ED Discharge Orders          Ordered    pantoprazole (PROTONIX) 40 MG tablet  Daily        12/04/21 1156              Regan Lemming, MD 12/04/21 1158

## 2021-12-04 NOTE — ED Provider Triage Note (Signed)
Emergency Medicine Provider Triage Evaluation Note  Deanna Salazar , a 81 y.o. female  was evaluated in triage.  Pt complains of epigastric pain and vomiting that began earlier this evening. Denies sick contacts with similar, changes in diet, etc.  Denies fever, diarrhea, etc.  No meds taken PTA.  Review of Systems  Positive: Abdominal pain, vomiting Negative: fever  Physical Exam  BP 135/77   Pulse 85   Temp 99.5 F (37.5 C) (Oral)   Resp 18   Ht '5\' 3"'$  (1.6 m)   Wt 82.9 kg   SpO2 95%   BMI 32.37 kg/m   Gen:   Awake, no distress   Resp:  Normal effort  MSK:   Moves extremities without difficulty  Other:  Tender epigastrium and LUQ, no RUQ tenderness, no murphy's sign  Medical Decision Making  Medically screening exam initiated at 3:36 AM.  Appropriate orders placed.  Deanna Salazar was informed that the remainder of the evaluation will be completed by another provider, this initial triage assessment does not replace that evaluation, and the importance of remaining in the ED until their evaluation is complete.  Abdominal pain, nausea/vomiting.  Non-toxic appearing in triage.  Given her age, will obtain EKG, labs including troponin, and CT abdomen/pelvis.   Larene Pickett, PA-C 12/04/21 [phone]

## 2021-12-04 NOTE — ED Triage Notes (Signed)
The pt woke up with nausea and abd burning

## 2021-12-06 ENCOUNTER — Telehealth: Payer: Self-pay | Admitting: Internal Medicine

## 2021-12-06 NOTE — Telephone Encounter (Signed)
Called pt to verify msg pt states the zolpidem 12.5 mg still not working. She states she is still waking up in the missle of the night. She would rather go back on the Zolpidem 10 mg because she she can take 1/2 and it she wakes up she can take the other 1/2. She can not 1/2 the 12.'5mg'$ .Marland KitchenJohny Salazar

## 2021-12-06 NOTE — Telephone Encounter (Signed)
Patient states that the 12.5 zolpiden is not as effective as the 10 mg.  Please call patient

## 2021-12-08 MED ORDER — ZOLPIDEM TARTRATE 10 MG PO TABS: ORAL_TABLET | ORAL | 5 refills | Status: DC

## 2021-12-08 NOTE — Telephone Encounter (Signed)
Notified pt w/ MD response../l b

## 2021-12-08 NOTE — Telephone Encounter (Signed)
Noted.  Will switch.  Thanks

## 2021-12-08 NOTE — Telephone Encounter (Signed)
Pt has called to check status of this RX. Please update pt as soon as possible.

## 2021-12-18 ENCOUNTER — Telehealth: Payer: Self-pay

## 2021-12-18 NOTE — Telephone Encounter (Signed)
        Patient  visited [facility] on 12/04/2021  for abdominal discomfort.   Telephone encounter attempt :  1st  A HIPAA compliant voice message was left requesting a return call.  Instructed patient to call back at [phone].   [facility] Resource Care Guide   ??[email or web address]  ?? [phone]   Website: [email or web address]

## 2021-12-19 ENCOUNTER — Telehealth: Payer: Self-pay

## 2021-12-19 NOTE — Telephone Encounter (Signed)
     Patient  visit on 12/04/2021  at [facility] was for abdominal discomfort.  Have you been able to follow up with your primary care physician? She feels better and did not need to follow up with her PCP.  Patient stated she has an appointment scheduled in February.  The patient was or was not able to obtain any needed medicine or equipment. Patient obtained medication.  Are there diet recommendations that you are having difficulty following? Patient stated she was given dietary recommendations and has been able to follow with no problems.  Patient expresses understanding of discharge instructions and education provided has no other needs at this time.    [facility] Resource Care Guide   ??[email or web address]  ?? [phone]   Website: [email or web address]  [email or web address]

## 2022-01-03 ENCOUNTER — Other Ambulatory Visit: Payer: Self-pay | Admitting: Internal Medicine

## 2022-01-09 ENCOUNTER — Ambulatory Visit
Admission: EM | Admit: 2022-01-09 | Discharge: 2022-01-09 | Disposition: A | Discharge status: Home / Self Care | Payer: PPO | Attending: Physician Assistant | Admitting: Physician Assistant

## 2022-01-09 DIAGNOSIS — J0140 Acute pansinusitis, unspecified: Secondary | ICD-10-CM | POA: Diagnosis not present

## 2022-01-09 MED ORDER — AMOXICILLIN-POT CLAVULANATE 875-125 MG PO TABS: 1.0000 | ORAL_TABLET | Freq: Two times a day (BID) | ORAL | 0 refills | Status: DC

## 2022-01-09 NOTE — ED Triage Notes (Signed)
Pt presents with nasal congestion and sinus pain X 2 weeks.

## 2022-01-09 NOTE — ED Provider Notes (Signed)
Deanna Salazar    CSN: [id number] Arrival date & time: 01/09/22  1002      History   Chief Complaint Chief Complaint  Patient presents with   Nasal Congestion    HPI Deanna Salazar is a 82 y.o. female.   Patient here today for evaluation of nasal congestion and sinus pain she has had for the last 2 weeks.  She reports sinus pressure in her maxillary and frontal sinus area.  She has had minimal cough and mild sore throat.  She has not had any fever.  She denies any vomiting or diarrhea.  She has tried over-the-counter medications without resolution.  The history is provided by the patient.    Past Medical History:  Diagnosis Date   Abdominal pain, generalized 02/05/2008   ABDOMINAL PAIN-EPIGASTRIC 03/24/2009   Acute bronchitis 02/15/2007   Adenomatous colon polyp 04/2000   Arthritis    ASCUS favor benign 10/2011   negative high risk HPV screen   BACK PAIN 07/20/2008   Breast cancer ([place]) 2009   l, hx of XRT Dr Benay Spice, Left side   BREAST CANCER, HX OF 04/16/2007   CHEST PAIN 02/23/2010   COLONIC POLYPS, ADENOMATOUS, HX OF 10/01/2007   CONSTIPATION, CHRONIC 05/10/2007   Dysuria 04/06/2008   FATIGUE 12/26/2006   Fatty liver    FATTY LIVER DISEASE 11/19/2006   FIBROMYALGIA 11/19/2006   GERD 11/19/2006   Dr Fuller Plan   Glaucoma 07/2017   left eye   HEMATOCHEZIA 05/10/2007   HEMORRHOIDS, INTERNAL 05/10/2007   HIP PAIN 07/20/2008   HYPERLIPIDEMIA 07/26/2009   HYPERTENSION 12/26/2006   HYPOKALEMIA 11/25/2007   HYPOTHYROIDISM 12/26/2006   IBS 11/19/2006   INSOMNIA, PERSISTENT 12/26/2006   KELOID 07/25/2007   MURMUR 07/25/2007   Nausea alone 03/23/2010   PARESTHESIA 04/06/2008   Personal history of radiation therapy    RENAL CYST, LEFT 02/23/2010   Rheumatoid arthritis(714.0) 11/19/2006   Dr Vianne Bulls   RUQ PAIN 11/07/2009   Tubular adenoma    VISION IMPAIRMENT, LOW VISION, ONE EYE-LEFT 07/25/2007    Patient Active Problem List   Diagnosis Date Noted   Foot pain, left  10/30/2021   Tongue swelling 09/28/2021   Lip swelling 09/28/2021   Angioedema 09/28/2021   Rash in adult 09/08/2021   Headache 07/27/2021   Weight loss 02/23/2021   CRF (chronic renal failure), stage 3 (moderate) ([place]) 02/25/2020   Nightmares 04/22/2019   Exposure to COVID-19 virus 10/09/2018   Cough 10/09/2018   Grief 11/23/2016   Unilateral primary osteoarthritis, right knee 08/23/2016   Hypokalemia 05/16/2016   Rhonchi 02/09/2016   Intertrigo 10/04/2015   Allergic rhinitis 04/05/2015   Well adult exam 06/28/2014   Tinea pedis 10/13/2013   Near syncope 09/28/2013   LBP (low back pain) 09/24/2012   Diabetes type 2, controlled ([place]) 11/06/2011   Breast cancer ([place]) 11/06/2011   URI, acute 03/02/2011   Flank pain 03/02/2011   Hyperglycemia 03/02/2011   Edema 10/30/2010   Dyspnea and respiratory abnormalities 10/30/2010   Cramp in limb 10/30/2010   Bronchitis 10/06/2010   NAUSEA ALONE 03/23/2010   RENAL CYST, LEFT 02/23/2010   Chest pain 02/23/2010   Dyslipidemia 07/26/2009   ABDOMINAL PAIN-EPIGASTRIC 03/24/2009   HIP PAIN 07/20/2008   BACK PAIN 07/20/2008   FEVER, NOS 07/20/2008   PARESTHESIA 04/06/2008   Dysuria 04/06/2008   Dysphagia, pharyngoesophageal phase 02/05/2008   ABDOMINAL PAIN, GENERALIZED 02/05/2008   HYPOKALEMIA 11/25/2007   Depression 11/25/2007   COLONIC POLYPS, ADENOMATOUS, HX  OF 10/01/2007   VISION IMPAIRMENT, LOW VISION, ONE EYE-LEFT 07/25/2007   KELOID 07/25/2007   MURMUR 07/25/2007   HEMORRHOIDS, INTERNAL 05/10/2007   CONSTIPATION, CHRONIC 05/10/2007   BREAST CANCER, HX OF 04/16/2007   ACUTE BRONCHITIS 02/15/2007   Hypothyroidism 12/26/2006   INSOMNIA, PERSISTENT 12/26/2006   Essential hypertension 12/26/2006   Chronic fatigue 12/26/2006   GERD 11/19/2006   IBS 11/19/2006   FATTY LIVER DISEASE 11/19/2006   Rheumatoid arthritis ([place]) 11/19/2006   Fibromyalgia 11/19/2006    Past Surgical History:  Procedure Laterality Date   BREAST  LUMPECTOMY Left 2009   BREAST SURGERY  2009   Left lumpectomy   COLONOSCOPY  06/12/2012   Cyst excision from chest     knee surgery Left    Left thyroidectomy     OS Cataract extraction  2009   OVARIAN CYST REMOVAL  age 27   SHOULDER SURGERY     both shoulders   TOE SURGERY     TUBAL LIGATION      OB History     Gravida  5   Para  5   Term      Preterm      AB      Living  5      SAB      IAB      Ectopic      Multiple      Live Births               Home Medications    Prior to Admission medications   Medication Sig Start Date End Date Taking? Authorizing Provider  amoxicillin-clavulanate (AUGMENTIN) 875-125 MG tablet Take 1 tablet by mouth every 12 (twelve) hours. 01/09/22  Yes Francene Finders, PA-C  acetaminophen (TYLENOL) 500 MG tablet Take 500-1,000 mg by mouth every 6 (six) hours as needed for mild pain, moderate pain, fever or headache.    [provider]  amLODipine (NORVASC) 10 MG tablet TAKE 1 TABLET BY MOUTH EVERY DAY 09/04/21   Plotnikov, Evie Lacks, MD  aspirin (ASPIRIN CHILDRENS) 81 MG chewable tablet Chew 1 tablet (81 mg total) by mouth daily. 12/06/14   Plotnikov, Evie Lacks, MD  Blood Glucose Monitoring Suppl (ONE TOUCH ULTRA 2) w/Device KIT Use to check blood sugars daily 10/28/19   Plotnikov, Evie Lacks, MD  brimonidine (ALPHAGAN) 0.2 % ophthalmic solution Place 1 drop into both eyes in the morning and at bedtime. 04/21/19   [provider]  carvedilol (COREG) 25 MG tablet TAKE 1 TABLET(25 MG) BY MOUTH TWICE DAILY WITH A MEAL 11/08/21   Plotnikov, Evie Lacks, MD  cetirizine (ZYRTEC ALLERGY) 10 MG tablet Take 1 tablet (10 mg total) by mouth daily. 09/08/21 09/08/22  Plotnikov, Evie Lacks, MD  cholecalciferol (VITAMIN D) 1000 units tablet Take 1,000 Units by mouth daily.    [provider]  diclofenac Sodium (VOLTAREN) 1 % GEL Apply 2 g topically 4 (four) times daily.    [provider]  famotidine (PEPCID) 40 MG  tablet 1 tablet at bedtime. 1 tablet at bedtime.    [provider]  folic acid (FOLVITE) 1 MG tablet Take 1 tablet daily    [provider]  furosemide (LASIX) 20 MG tablet TAKE 1 TO 2 TABLETS(20 TO 40 MG) BY MOUTH DAILY AS NEEDED FOR SWELLING Patient taking differently: Take 10 mg by mouth daily as needed for edema or fluid. Takes at least twice weekly 07/14/20   Plotnikov, Evie Lacks, MD  glucose blood (  ONETOUCH ULTRA) test strip TEST GLUCOSE LEVELS ONCE DAILY 01/03/22   Plotnikov, Evie Lacks, MD  ketoconazole (NIZORAL) 2 % cream Apply 1 Application topically daily. 11/07/21   Trula Slade, DPM  ketotifen (ZADITOR) 0.025 % ophthalmic solution Place 1 drop into both eyes 2 (two) times daily.    [provider]  Lancets (ONETOUCH DELICA PLUS OINOMV67M) MISC USE TO TEST BLOOD SUGAR LEVELS 10/12/21   Plotnikov, Evie Lacks, MD  latanoprost (XALATAN) 0.005 % ophthalmic solution Place 1 drop into both eyes at bedtime. 07/08/17   [provider]  levothyroxine (SYNTHROID) 25 MCG tablet TAKE 1 TABLET BY MOUTH EVERY DAY BEFORE BREAKFAST 10/20/21   Plotnikov, Evie Lacks, MD  Lidocaine 4 % PTCH Apply 1 patch topically daily. 12 hours on and 12 hours off    [provider]  meclizine (ANTIVERT) 25 MG tablet TAKE 1 TABLET BY MOUTH THREE TIMES DAILY AS NEEDED FOR DIZZINESS OR NAUSEA 11/17/20   Plotnikov, Evie Lacks, MD  metFORMIN (GLUCOPHAGE) 500 MG tablet TAKE 1 TABLET(500 MG) BY MOUTH TWICE DAILY WITH A MEAL 02/20/21   Plotnikov, Evie Lacks, MD  methotrexate 2.5 MG tablet Take 20 mg by mouth once a week. 07/15/19   [provider]  Multiple Vitamins-Minerals (PRESERVISION AREDS 2+MULTI VIT PO) Take 1 capsule by mouth in the morning and at bedtime.    [provider]  pantoprazole (PROTONIX) 40 MG tablet Take 1 tablet (40 mg total) by mouth daily. 12/04/21 01/03/22  Regan Lemming, MD  Polyethyl Glycol-Propyl Glycol (SYSTANE OP) Apply 1 drop to eye as  needed.    [provider]  Polyethylene Glycol 3350 (MIRALAX PO) Take by mouth.    [provider]  potassium chloride (KLOR-CON M) 10 MEQ tablet Take 1 tablet (10 mEq total) by mouth daily. 02/26/21   Plotnikov, Evie Lacks, MD  predniSONE (DELTASONE) 10 MG tablet Take 50 mg on Day #1 prn allergic reaction. Take 30 mg on Day #2.Take 20 mg on Day #3. Then stop 09/28/21   Plotnikov, Evie Lacks, MD  triamcinolone cream (KENALOG) 0.1 % Apply 1 Application topically 4 (four) times daily. 09/08/21   Plotnikov, Evie Lacks, MD  zolpidem (AMBIEN) 10 MG tablet Take 10 mg at at bedtime as needed.  Can repeat 5 mg once in 4 hours as needed. 12/08/21   Plotnikov, Evie Lacks, MD    Family History Family History  Problem Relation Age of Onset   Lung cancer Brother    Colon cancer Brother 66       50's   Diabetes Father    Brain cancer Brother    Stroke Sister    Breast cancer Neg Hx     Social History Social History   Tobacco Use   Smoking status: Never   Smokeless tobacco: Never  Vaping Use   Vaping Use: Never used  Substance Use Topics   Alcohol use: No    Alcohol/week: 0.0 standard drinks of alcohol   Drug use: No     Allergies   Codeine, Hydrocodone, Hydrocodone-acetaminophen, Hydroxyzine, Lovastatin, Pantoprazole, Pepcid [famotidine], Telmisartan, Tramadol hcl, and Trazodone and nefazodone   Review of Systems Review of Systems  Constitutional:  Negative for chills and fever.  HENT:  Positive for congestion, sinus pressure and sore throat. Negative for ear pain.   Eyes:  Negative for discharge and redness.  Respiratory:  Negative for cough, shortness of breath and wheezing.   Gastrointestinal:  Negative for abdominal pain, diarrhea, nausea and vomiting.  Physical Exam Triage Vital Signs ED Triage Vitals [01/09/22 1210]  Enc Vitals Group     BP (!) 158/80     Pulse Rate (!) 58     Resp 17     Temp 98 F (36.7 C)     Temp Source Oral     SpO2 96 %      Weight      Height      Head Circumference      Peak Flow      Pain Score 5     Pain Loc      Pain Edu?      Excl. in [place]?    No data found.  Updated Vital Signs BP (!) 158/80 (BP Location: Left Arm)   Pulse (!) 58   Temp 98 F (36.7 C) (Oral)   Resp 17   SpO2 96%      Physical Exam Vitals and nursing note reviewed.  Constitutional:      General: She is not in acute distress.    Appearance: Normal appearance. She is not ill-appearing.  HENT:     Head: Normocephalic and atraumatic.     Nose: Congestion present.     Mouth/Throat:     Mouth: Mucous membranes are moist.     Pharynx: No oropharyngeal exudate or posterior oropharyngeal erythema.  Eyes:     Conjunctiva/sclera: Conjunctivae normal.  Cardiovascular:     Rate and Rhythm: Normal rate and regular rhythm.     Heart sounds: Normal heart sounds. No murmur heard. Pulmonary:     Effort: Pulmonary effort is normal. No respiratory distress.     Breath sounds: Normal breath sounds. No wheezing, rhonchi or rales.  Skin:    General: Skin is warm and dry.  Neurological:     Mental Status: She is alert.  Psychiatric:        Mood and Affect: Mood normal.        Thought Content: Thought content normal.      UC Treatments / Results  Labs (all labs ordered are listed, but only abnormal results are displayed) Labs Reviewed - No data to display  EKG   Radiology No results found.  Procedures Procedures (including critical Salazar time)  Medications Ordered in UC Medications - No data to display  Initial Impression / Assessment and Plan / UC Course  I have reviewed the triage vital signs and the nursing notes.  Pertinent labs & imaging results that were available during my Salazar of the patient were reviewed by me and considered in my medical decision making (see chart for details).    Augmentin prescribed for suspected sinusitis.  Encouraged follow-up if no gradual improvement or with any further  concerns.  Final Clinical Impressions(s) / UC Diagnoses   Final diagnoses:  Acute pansinusitis, recurrence not specified   Discharge Instructions   None    ED Prescriptions     Medication Sig Dispense Auth. Provider   amoxicillin-clavulanate (AUGMENTIN) 875-125 MG tablet Take 1 tablet by mouth every 12 (twelve) hours. 14 tablet Francene Finders, PA-C      PDMP not reviewed this encounter.   Francene Finders, PA-C 01/09/22 1606

## 2022-01-24 ENCOUNTER — Other Ambulatory Visit: Payer: Self-pay | Admitting: *Deleted

## 2022-01-24 MED ORDER — MECLIZINE HCL 25 MG PO TABS: ORAL_TABLET | ORAL | 3 refills | Status: DC

## 2022-01-31 IMAGING — MG DIGITAL SCREENING BILAT W/ TOMO W/ CAD
8 series · 8 of 24 positions shown · non-contrast
Comparison: Previous exam(s).

CLINICAL DATA: Screening.

EXAM:
DIGITAL SCREENING BILATERAL MAMMOGRAM WITH TOMO AND CAD

[R MLO synth-2D]
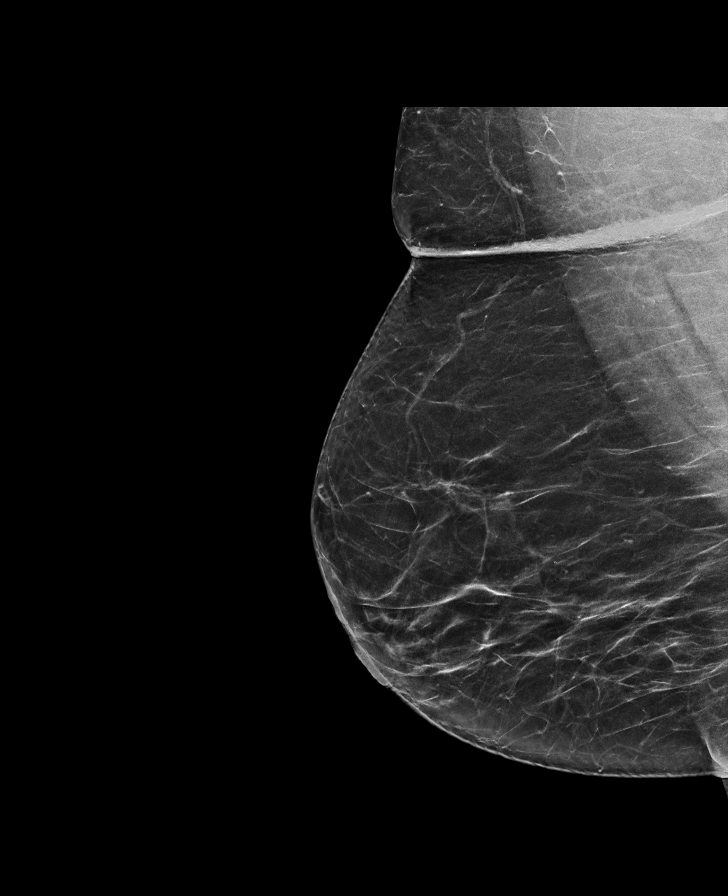

[L CC synth-2D]
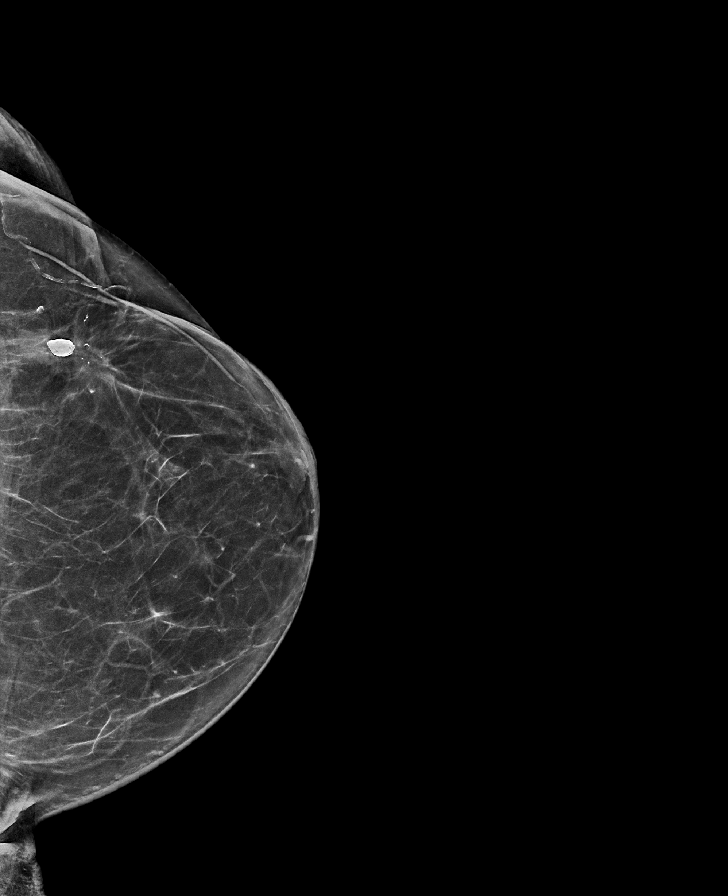

[R CC synth-2D]
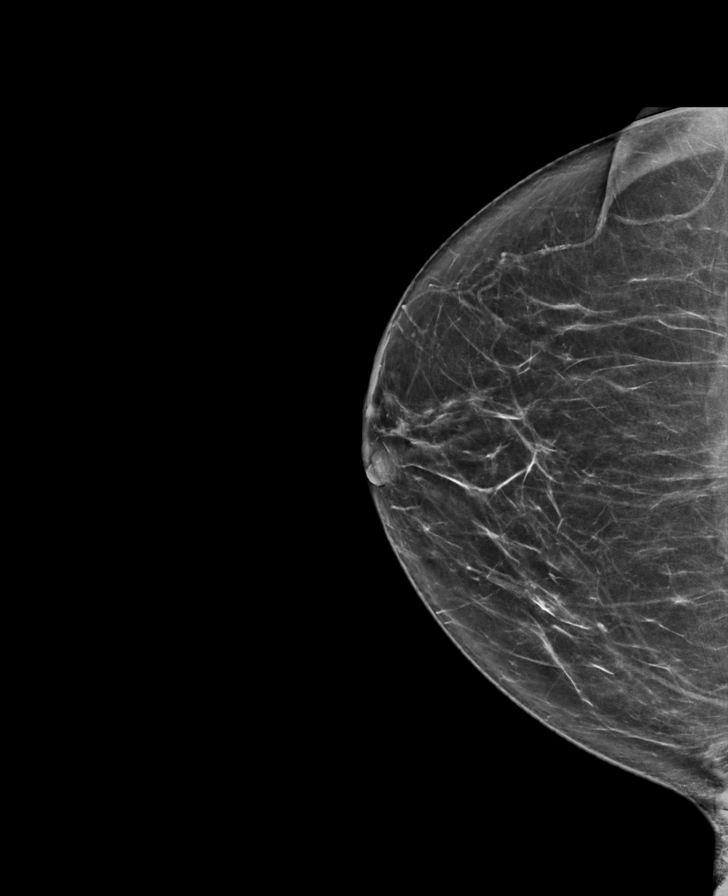

[L MLO synth-2D]
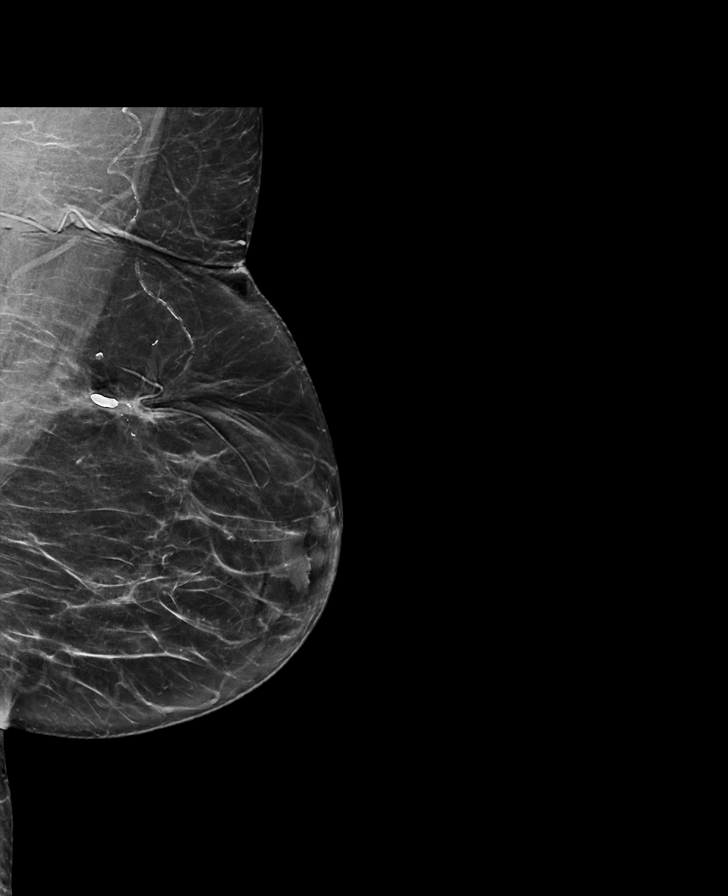

[L MLO tomo · tomo slice 37/72.0]
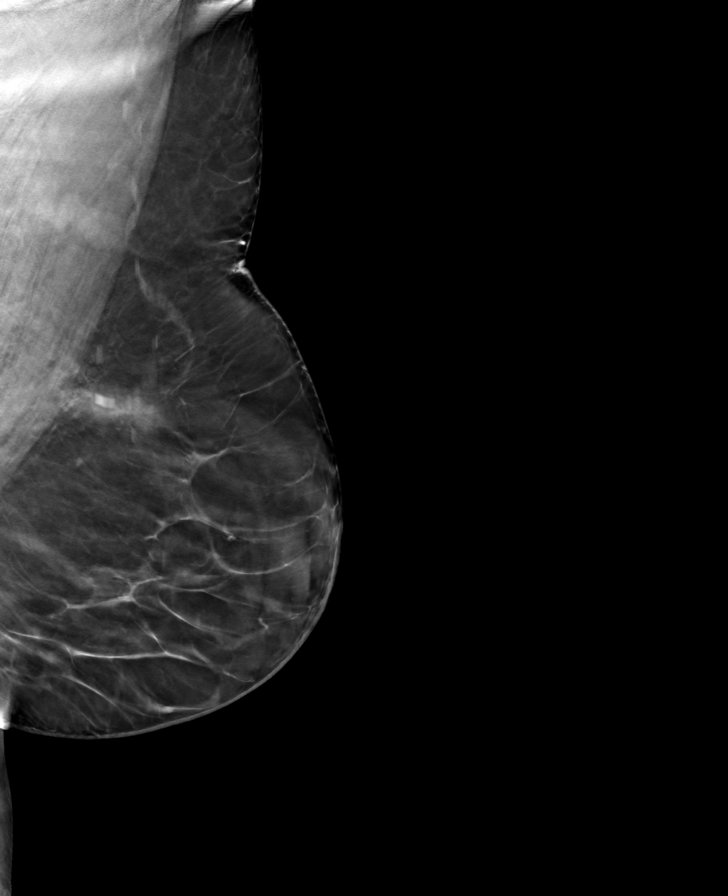

[R CC tomo · tomo slice 36/71.0]
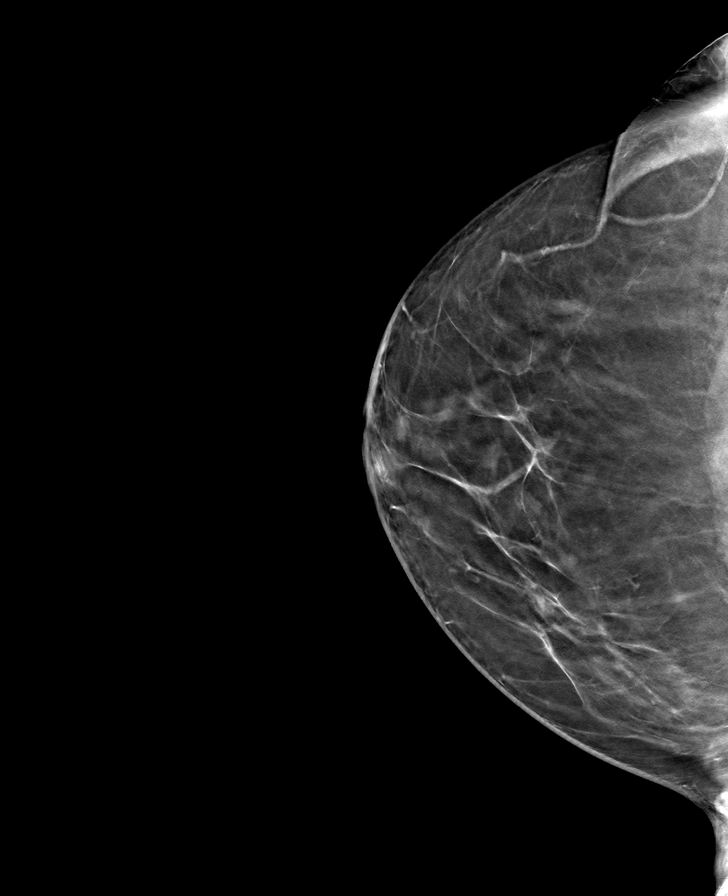

[L CC tomo · tomo slice 33/66.0]
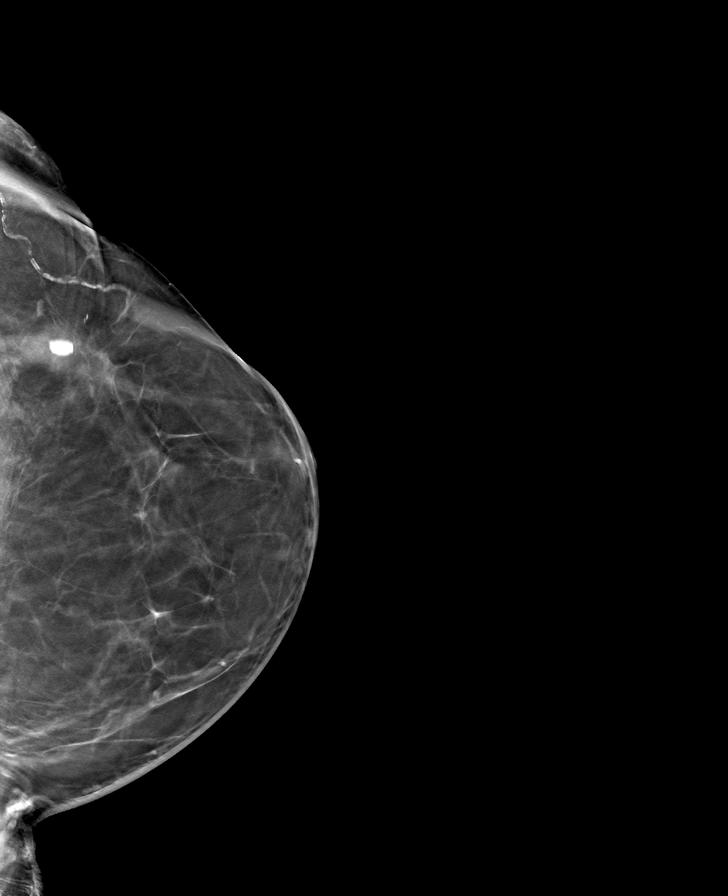

[R MLO tomo · tomo slice 37/73.0]
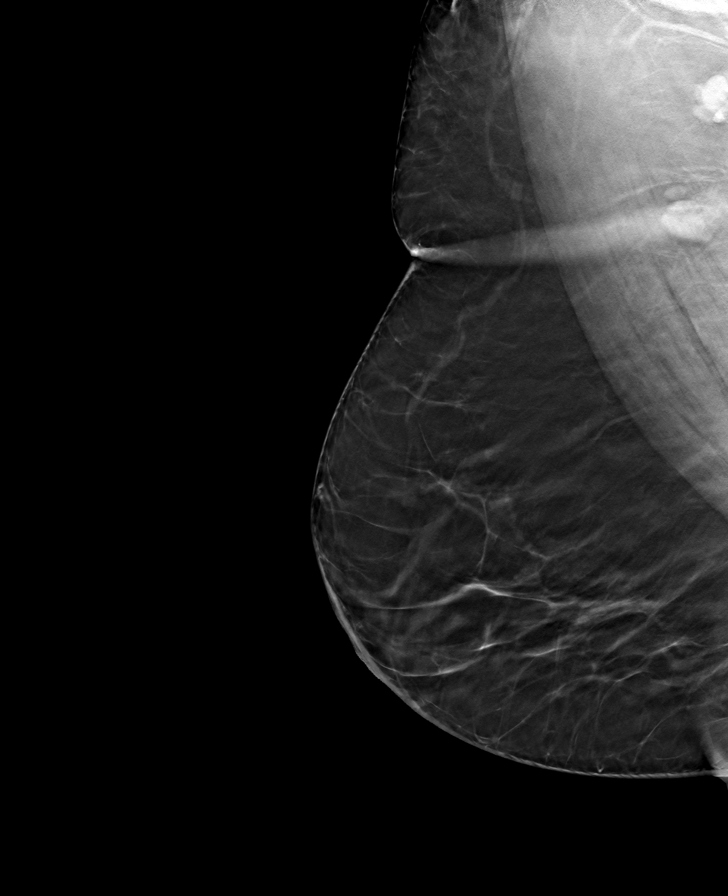

[8 of 24 positions shown; findings below may reference images not displayed]

ACR Breast Density Category b: There are scattered areas of
fibroglandular density.
FINDINGS: There are no findings suspicious for malignancy. Images were
processed with CAD.
IMPRESSION: No mammographic evidence of malignancy. A result letter of this
screening mammogram will be mailed directly to the patient.

RECOMMENDATION:
Screening mammogram in one year. (Code:[id number])

BI-RADS CATEGORY  1: Negative.

## 2022-02-05 ENCOUNTER — Telehealth: Payer: Self-pay | Admitting: Internal Medicine

## 2022-02-05 MED ORDER — ONETOUCH ULTRA VI STRP: ORAL_STRIP | 3 refills | Status: DC

## 2022-02-05 MED ORDER — ONETOUCH DELICA PLUS LANCET30G MISC: 2 refills | Status: DC

## 2022-02-05 MED ORDER — ONETOUCH ULTRA 2 W/DEVICE KIT: PACK | 0 refills | Status: AC

## 2022-02-05 NOTE — Telephone Encounter (Signed)
Patient called stating her glucose machine is not working, she would like for a new prescription for the machine to be sent in for her. She would like [facility] on [address]. If there are any questions she can be reached on her cell at [phone].

## 2022-02-05 NOTE — Telephone Encounter (Signed)
Not[facility]

## 2022-02-12 ENCOUNTER — Ambulatory Visit: Payer: PPO | Admitting: Podiatry

## 2022-02-12 VITALS — BP 139/72 | HR 61

## 2022-02-12 DIAGNOSIS — M79675 Pain in left toe(s): Secondary | ICD-10-CM | POA: Diagnosis not present

## 2022-02-12 DIAGNOSIS — B351 Tinea unguium: Secondary | ICD-10-CM | POA: Diagnosis not present

## 2022-02-12 DIAGNOSIS — M79674 Pain in right toe(s): Secondary | ICD-10-CM

## 2022-02-12 DIAGNOSIS — D2372 Other benign neoplasm of skin of left lower limb, including hip: Secondary | ICD-10-CM

## 2022-02-12 NOTE — Patient Instructions (Signed)
Keep the bandage on for 24 hours. At that time, remove and clean with soap and water. If it hurts or burns before 24 hours go ahead and remove the bandage and wash with soap and water. Keep the area clean. If there is any blistering cover with antibiotic ointment and a bandage. Monitor for any redness, drainage, or other signs of infection. Call the office if any are to occur. If you have any questions, please call the office at [phone].  

## 2022-02-12 NOTE — Progress Notes (Unsigned)
Subjective: Chief Complaint  Patient presents with   Plantar Warts    Patient states they don't seem to be getting any better. They hurt when I put pressure on the area to walk.    Nail Problem    Patient states the medicine that was given made the nails soft and they are flakey now.    82 year old female presents the office for above concerns.  She states that area on the bottom of her foot does not seem to be getting better.  Felt better after the last appointment however the pain is coming back and does the same as it did.  She been using medication on the toenails but she feels the nails were softened with this.  No drainage or pus.  No open lesions.  No other concerns.    Objective: AAO x3, NAD DP/PT pulses palpable bilaterally, CRT less than 3 seconds Hyperkeratotic lesion noted submetatarsal 3 without any underlying ulceration drainage or sign of infection.  Additionally, dystrophic with yellow, brown discoloration.  There is no edema, erythema or signs of infection.  Nails appear to be somewhat more soft in nature and slightly improved in color. No pain with calf compression, swelling, warmth, erythema  Assessment: Skin lesion; symptomatic onychomycosis  Plan: -All treatment options discussed with the patient including all alternatives, risks, complications.  -Sharply debrided the nails x 10 without any complications or bleeding.  Continue topical medication. -Sharply debrided the hyperkeratotic lesion without any complications or bleeding. A pad was placed followed by salicylic acid and a bandage.  Postprocedure instructions discussed.  Monitor any signs or symptoms of infection. -Patient encouraged to call the office with any questions, concerns, change in symptoms.   Trula Slade DPM

## 2022-02-14 DIAGNOSIS — M0609 Rheumatoid arthritis without rheumatoid factor, multiple sites: Secondary | ICD-10-CM | POA: Diagnosis not present

## 2022-02-14 DIAGNOSIS — M1711 Unilateral primary osteoarthritis, right knee: Secondary | ICD-10-CM | POA: Diagnosis not present

## 2022-02-14 DIAGNOSIS — Z6830 Body mass index (BMI) 30.0-30.9, adult: Secondary | ICD-10-CM | POA: Diagnosis not present

## 2022-02-14 DIAGNOSIS — E669 Obesity, unspecified: Secondary | ICD-10-CM | POA: Diagnosis not present

## 2022-02-14 DIAGNOSIS — M25561 Pain in right knee: Secondary | ICD-10-CM | POA: Diagnosis not present

## 2022-02-19 ENCOUNTER — Telehealth: Payer: Self-pay | Admitting: Internal Medicine

## 2022-02-19 DIAGNOSIS — H1045 Other chronic allergic conjunctivitis: Secondary | ICD-10-CM | POA: Diagnosis not present

## 2022-02-19 DIAGNOSIS — H0288A Meibomian gland dysfunction right eye, upper and lower eyelids: Secondary | ICD-10-CM | POA: Diagnosis not present

## 2022-02-19 DIAGNOSIS — H401122 Primary open-angle glaucoma, left eye, moderate stage: Secondary | ICD-10-CM | POA: Diagnosis not present

## 2022-02-19 DIAGNOSIS — H401111 Primary open-angle glaucoma, right eye, mild stage: Secondary | ICD-10-CM | POA: Diagnosis not present

## 2022-02-19 DIAGNOSIS — H0288B Meibomian gland dysfunction left eye, upper and lower eyelids: Secondary | ICD-10-CM | POA: Diagnosis not present

## 2022-02-19 DIAGNOSIS — Z961 Presence of intraocular lens: Secondary | ICD-10-CM | POA: Diagnosis not present

## 2022-02-19 DIAGNOSIS — E119 Type 2 diabetes mellitus without complications: Secondary | ICD-10-CM | POA: Diagnosis not present

## 2022-02-19 DIAGNOSIS — H209 Unspecified iridocyclitis: Secondary | ICD-10-CM | POA: Diagnosis not present

## 2022-02-19 NOTE — Telephone Encounter (Signed)
Patient needs a new rx for zolpidem - her insurance will only pay for 30 tablets a month. - no higher than 10 mg.  [facility] [address]

## 2022-02-20 NOTE — Telephone Encounter (Signed)
Notified pt she states he can just rx the 30 tab. [facility]l;mb

## 2022-02-20 NOTE — Telephone Encounter (Signed)
Does she want to pay for the rest of the the prescription herself should I prescribed 30/month?  Thanks

## 2022-02-21 MED ORDER — ZOLPIDEM TARTRATE 10 MG PO TABS: ORAL_TABLET | ORAL | 5 refills | Status: DC

## 2022-02-21 NOTE — Telephone Encounter (Signed)
Okay.  Thanks.

## 2022-02-28 ENCOUNTER — Telehealth: Payer: Self-pay | Admitting: Internal Medicine

## 2022-02-28 MED ORDER — CARVEDILOL 25 MG PO TABS: ORAL_TABLET | ORAL | 1 refills | Status: DC

## 2022-02-28 NOTE — Telephone Encounter (Signed)
[facility].../lmb

## 2022-02-28 NOTE — Telephone Encounter (Signed)
Pt called requesting for her Rx to be refilled. Pt also states she is completely out and her BP is high.  carvedilol (COREG) 25 MG tablet

## 2022-03-05 ENCOUNTER — Ambulatory Visit (INDEPENDENT_AMBULATORY_CARE_PROVIDER_SITE_OTHER): Payer: PPO | Admitting: Internal Medicine

## 2022-03-05 ENCOUNTER — Encounter: Payer: Self-pay | Admitting: Internal Medicine

## 2022-03-05 VITALS — BP 118/70 | HR 68 | Temp 98.7°F | Ht 63.0 in | Wt 184.0 lb

## 2022-03-05 DIAGNOSIS — E119 Type 2 diabetes mellitus without complications: Secondary | ICD-10-CM | POA: Diagnosis not present

## 2022-03-05 DIAGNOSIS — R5382 Chronic fatigue, unspecified: Secondary | ICD-10-CM

## 2022-03-05 DIAGNOSIS — F5101 Primary insomnia: Secondary | ICD-10-CM | POA: Diagnosis not present

## 2022-03-05 DIAGNOSIS — M06031 Rheumatoid arthritis without rheumatoid factor, right wrist: Secondary | ICD-10-CM

## 2022-03-05 DIAGNOSIS — K219 Gastro-esophageal reflux disease without esophagitis: Secondary | ICD-10-CM

## 2022-03-05 DIAGNOSIS — I10 Essential (primary) hypertension: Secondary | ICD-10-CM

## 2022-03-05 DIAGNOSIS — M06032 Rheumatoid arthritis without rheumatoid factor, left wrist: Secondary | ICD-10-CM

## 2022-03-05 DIAGNOSIS — E669 Obesity, unspecified: Secondary | ICD-10-CM | POA: Insufficient documentation

## 2022-03-05 NOTE — Assessment & Plan Note (Signed)
Re-start Zolpidem due to cost

## 2022-03-05 NOTE — Assessment & Plan Note (Addendum)
On Metformin bid. Pt wants to stop Metformin - she is worried. Discussed.  Pt is getting labs w/Rheumatology every 2 mo (CMET, CBC)

## 2022-03-05 NOTE — Assessment & Plan Note (Signed)
Treat insomnia 

## 2022-03-05 NOTE — Progress Notes (Signed)
Subjective:  Patient ID: Deanna Salazar, female    DOB: [birth year]  Age: 82 y.o. MRN: [id number]  CC: Follow-up (4 MONTH FOLLOW UP)   HPI Deanna Salazar presents for HTN, RA, DM f/u Pt is getting labs w/Rheumatology every 2 mo (CMET, CBC) C/o insomnia  Outpatient Medications Prior to Visit  Medication Sig Dispense Refill   acetaminophen (TYLENOL) 500 MG tablet Take 500-1,000 mg by mouth every 6 (six) hours as needed for mild pain, moderate pain, fever or headache.     amLODipine (NORVASC) 10 MG tablet TAKE 1 TABLET BY MOUTH EVERY DAY 90 tablet 1   amoxicillin-clavulanate (AUGMENTIN) 875-125 MG tablet Take 1 tablet by mouth every 12 (twelve) hours. 14 tablet 0   aspirin (ASPIRIN CHILDRENS) 81 MG chewable tablet Chew 1 tablet (81 mg total) by mouth daily. 36 tablet 11   Blood Glucose Monitoring Suppl (ONE TOUCH ULTRA 2) w/Device KIT Use to check blood sugars daily 1 kit 0   brimonidine (ALPHAGAN) 0.2 % ophthalmic solution Place 1 drop into both eyes in the morning and at bedtime.     carvedilol (COREG) 25 MG tablet TAKE 1 TABLET(25 MG) BY MOUTH TWICE DAILY WITH A MEAL 180 tablet 1   cetirizine (ZYRTEC ALLERGY) 10 MG tablet Take 1 tablet (10 mg total) by mouth daily. 30 tablet 3   cholecalciferol (VITAMIN D) 1000 units tablet Take 1,000 Units by mouth daily.     diclofenac Sodium (VOLTAREN) 1 % GEL Apply 2 g topically 4 (four) times daily.     famotidine (PEPCID) 40 MG tablet 1 tablet at bedtime. 1 tablet at bedtime.     folic acid (FOLVITE) 1 MG tablet Take 1 tablet daily     furosemide (LASIX) 20 MG tablet TAKE 1 TO 2 TABLETS(20 TO 40 MG) BY MOUTH DAILY AS NEEDED FOR SWELLING (Patient taking differently: Take 10 mg by mouth daily as needed for edema or fluid. Takes at least twice weekly) 60 tablet 5   glucose blood (ONETOUCH ULTRA) test strip TEST GLUCOSE LEVELS ONCE DAILY 100 strip 3   ketoconazole (NIZORAL) 2 % cream Apply 1 Application topically daily. 60 g 0   ketotifen (ZADITOR)  0.025 % ophthalmic solution Place 1 drop into both eyes 2 (two) times daily.     Lancets (ONETOUCH DELICA PLUS Q000111Q) MISC USE TO TEST BLOOD SUGAR LEVELS 100 each 2   latanoprost (XALATAN) 0.005 % ophthalmic solution Place 1 drop into both eyes at bedtime.  3   levothyroxine (SYNTHROID) 25 MCG tablet TAKE 1 TABLET BY MOUTH EVERY DAY BEFORE BREAKFAST 90 tablet 2   Lidocaine 4 % PTCH Apply 1 patch topically daily. 12 hours on and 12 hours off     meclizine (ANTIVERT) 25 MG tablet TAKE 1 TABLET BY MOUTH THREE TIMES DAILY AS NEEDED FOR DIZZINESS OR NAUSEA 30 tablet 3   metFORMIN (GLUCOPHAGE) 500 MG tablet TAKE 1 TABLET(500 MG) BY MOUTH TWICE DAILY WITH A MEAL 180 tablet 3   methotrexate 2.5 MG tablet Take 20 mg by mouth once a week.     Multiple Vitamins-Minerals (PRESERVISION AREDS 2+MULTI VIT PO) Take 1 capsule by mouth in the morning and at bedtime.     Polyethyl Glycol-Propyl Glycol (SYSTANE OP) Apply 1 drop to eye as needed.     Polyethylene Glycol 3350 (MIRALAX PO) Take by mouth.     potassium chloride (KLOR-CON M) 10 MEQ tablet Take 1 tablet (10 mEq total) by mouth daily. 90 tablet 3  predniSONE (DELTASONE) 10 MG tablet Take 50 mg on Day #1 prn allergic reaction. Take 30 mg on Day #2.Take 20 mg on Day #3. Then stop 10 tablet 1   triamcinolone cream (KENALOG) 0.1 % Apply 1 Application topically 4 (four) times daily. 80 g 3   zolpidem (AMBIEN) 10 MG tablet Take 10 mg at at bedtime as needed. 30 tablet 5   pantoprazole (PROTONIX) 40 MG tablet Take 1 tablet (40 mg total) by mouth daily. 30 tablet 0   No facility-administered medications prior to visit.    ROS: Review of Systems  Constitutional:  Positive for fatigue. Negative for activity change, appetite change, chills and unexpected weight change.  HENT:  Negative for congestion, mouth sores and sinus pressure.   Eyes:  Negative for visual disturbance.  Respiratory:  Negative for cough and chest tightness.   Gastrointestinal:   Negative for abdominal pain and nausea.  Genitourinary:  Negative for difficulty urinating, frequency and vaginal pain.  Musculoskeletal:  Positive for arthralgias. Negative for back pain and gait problem.  Skin:  Negative for pallor and rash.  Neurological:  Negative for dizziness, tremors, weakness, numbness and headaches.  Hematological:  Does not bruise/bleed easily.  Psychiatric/Behavioral:  Positive for sleep disturbance. Negative for confusion and suicidal ideas.     Objective:  BP 118/70 (BP Location: Left Arm, Patient Position: Sitting, Cuff Size: Large)   Pulse 68   Temp 98.7 F (37.1 C) (Oral)   Ht '5\' 3"'$  (1.6 m)   Wt 184 lb (83.5 kg)   SpO2 95%   BMI 32.59 kg/m   BP Readings from Last 3 Encounters:  03/05/22 118/70  02/12/22 139/72  01/09/22 (!) 158/80    Wt Readings from Last 3 Encounters:  03/05/22 184 lb (83.5 kg)  12/04/21 182 lb 12.2 oz (82.9 kg)  10/30/21 182 lb 12.8 oz (82.9 kg)    Physical Exam Constitutional:      General: She is not in acute distress.    Appearance: She is well-developed. She is obese.  HENT:     Head: Normocephalic.     Right Ear: External ear normal.     Left Ear: External ear normal.     Nose: Nose normal.  Eyes:     General:        Right eye: No discharge.        Left eye: No discharge.     Conjunctiva/sclera: Conjunctivae normal.     Pupils: Pupils are equal, round, and reactive to light.  Neck:     Thyroid: No thyromegaly.     Vascular: No JVD.     Trachea: No tracheal deviation.  Cardiovascular:     Rate and Rhythm: Normal rate and regular rhythm.     Heart sounds: Normal heart sounds.  Pulmonary:     Effort: No respiratory distress.     Breath sounds: No stridor. No wheezing.  Abdominal:     General: Bowel sounds are normal. There is no distension.     Palpations: Abdomen is soft. There is no mass.     Tenderness: There is no abdominal tenderness. There is no guarding or rebound.  Musculoskeletal:         General: No tenderness.     Cervical back: Normal range of motion and neck supple. No rigidity.  Lymphadenopathy:     Cervical: No cervical adenopathy.  Skin:    Findings: No erythema or rash.  Neurological:     Mental Status: She is oriented  to person, place, and time.     Cranial Nerves: No cranial nerve deficit.     Motor: No abnormal muscle tone.     Coordination: Coordination normal.     Deep Tendon Reflexes: Reflexes normal.  Psychiatric:        Behavior: Behavior normal.        Thought Content: Thought content normal.        Judgment: Judgment normal.     Lab Results  Component Value Date   WBC 8.3 12/04/2021   HGB 12.2 12/04/2021   HCT 36.5 12/04/2021   PLT 205 12/04/2021   GLUCOSE 130 (H) 12/04/2021   CHOL 187 08/17/2020   TRIG 132.0 08/17/2020   HDL 46.00 08/17/2020   LDLDIRECT 180.9 07/26/2009   LDLCALC 114 (H) 08/17/2020   ALT 12 12/04/2021   AST 18 12/04/2021   NA 138 12/04/2021   K 3.6 12/04/2021   CL 105 12/04/2021   CREATININE 0.96 12/04/2021   BUN 14 12/04/2021   CO2 22 12/04/2021   TSH 3.03 10/30/2021   HGBA1C 5.9 10/30/2021   MICROALBUR <0.7 02/23/2020    No results found.  Assessment & Plan:   Problem List Items Addressed This Visit       Cardiovascular and Mediastinum   Essential hypertension     Lasix prn -  use 1/2 tab Pt is getting labs w/Rheumatology every 2 mo (CMET, CBC)        Digestive   GERD    On Pepcid po        Endocrine   Diabetes type 2, controlled ([place]) - Primary    On Metformin bid. Pt wants to stop Metformin - she is worried. Discussed.  Pt is getting labs w/Rheumatology every 2 mo (CMET, CBC)        Musculoskeletal and Integument   Rheumatoid arthritis (HCC)    Pt is getting labs w/Rheumatology every 2 mo (CMET, CBC)        Other   Insomnia    Re-start Zolpidem due to cost      Chronic fatigue    Treat insomnia         No orders of the defined types were placed in this encounter.      Follow-up: Return in about 6 months (around 09/03/2022) for a follow-up visit.  Walker Kehr, MD

## 2022-03-05 NOTE — Assessment & Plan Note (Signed)
On Pepcid po

## 2022-03-05 NOTE — Assessment & Plan Note (Addendum)
Lasix prn -  use 1/2 tab Pt is getting labs w/Rheumatology every 2 mo (CMET, CBC)

## 2022-03-05 NOTE — Assessment & Plan Note (Signed)
Pt is getting labs w/Rheumatology every 2 mo (CMET, CBC)

## 2022-03-07 ENCOUNTER — Other Ambulatory Visit: Payer: Self-pay | Admitting: *Deleted

## 2022-03-07 MED ORDER — AMLODIPINE BESYLATE 10 MG PO TABS: 10.0000 mg | ORAL_TABLET | Freq: Every day | ORAL | 1 refills | Status: DC

## 2022-03-13 ENCOUNTER — Ambulatory Visit (INDEPENDENT_AMBULATORY_CARE_PROVIDER_SITE_OTHER): Payer: PPO

## 2022-03-13 VITALS — Ht 63.0 in | Wt 184.0 lb

## 2022-03-13 DIAGNOSIS — Z0000 Encounter for general adult medical examination without abnormal findings: Secondary | ICD-10-CM

## 2022-03-13 NOTE — Patient Instructions (Addendum)
Ms. Deanna Salazar , Thank you for taking time to come for your Medicare Wellness Visit. I appreciate your ongoing commitment to your health goals. Please review the following plan we discussed and let me know if I can assist you in the future.   These are the goals we discussed:  Goals      Monitor and Manage My Blood Sugar-Diabetes Type 2     Timeframe:  Long-Range Goal Priority:  High Start Date:  03/13/2022                       Expected End Date:  none  Follow Up Date 10/2022   - check blood sugar at prescribed times - check blood sugar if I feel it is too high or too low - enter blood sugar readings and medication or insulin into daily log - take the blood sugar log to all doctor visits - take the blood sugar meter to all doctor visits    Why is this important?   Checking your blood sugar at home helps to keep it from getting very high or very low.  Writing the results in a diary or log helps the doctor know how to care for you.  Your blood sugar log should have the time, date and the results.  Also, write down the amount of insulin or other medicine that you take.  Other information, like what you ate, exercise done and how you were feeling, will also be helpful.       Track and Manage My Blood Pressure-Hypertension     Timeframe:  Long-Range Goal   - check blood pressure 3 times per week - choose a place to take my blood pressure (home, clinic or office, retail store) - write blood pressure results in a log or diary    Why is this important?   You won't feel high blood pressure, but it can still hurt your blood vessels.  High blood pressure can cause heart or kidney problems. It can also cause a stroke.  Making lifestyle changes like losing a little weight or eating less salt will help.  Checking your blood pressure at home and at different times of the day can help to control blood pressure.  If the doctor prescribes medicine remember to take it the way the doctor ordered.   Call the office if you cannot afford the medicine or if there are questions about it.          This is a list of the screening recommended for you and due dates:  Health Maintenance  Topic Date Due   Yearly kidney health urinalysis for diabetes  02/22/2021   Complete foot exam   02/22/2021   COVID-19 Vaccine (4 - 2023-24 season) 09/08/2021   Eye exam for diabetics  10/21/2022   Yearly kidney function blood test for diabetes  12/05/2022   Medicare Annual Wellness Visit  03/13/2023   DTaP/Tdap/Td vaccine (2 - Tdap) 04/04/2024   Pneumonia Vaccine  Completed   Flu Shot  Completed   DEXA scan (bone density measurement)  Completed   Zoster (Shingles) Vaccine  Completed   HPV Vaccine  Aged Out    Advanced directives: No; Advance directive discussed with you today. Even though you declined this today please call our office should you change your mind and we can give you the proper paperwork for you to fill out.  Conditions/risks identified: Yes  Next appointment: Follow up in one year for your annual  wellness visit.   Preventive Care 13 Years and Older, Female Preventive care refers to lifestyle choices and visits with your health care provider that can promote health and wellness. What does preventive care include? A yearly physical exam. This is also called an annual well check. Dental exams once or twice a year. Routine eye exams. Ask your health care provider how often you should have your eyes checked. Personal lifestyle choices, including: Daily care of your teeth and gums. Regular physical activity. Eating a healthy diet. Avoiding tobacco and drug use. Limiting alcohol use. Practicing safe sex. Taking low-dose aspirin every day. Taking vitamin and mineral supplements as recommended by your health care provider. What happens during an annual well check? The services and screenings done by your health care provider during your annual well check will depend on your age,  overall health, lifestyle risk factors, and family history of disease. Counseling  Your health care provider may ask you questions about your: Alcohol use. Tobacco use. Drug use. Emotional well-being. Home and relationship well-being. Sexual activity. Eating habits. History of falls. Memory and ability to understand (cognition). Work and work Statistician. Reproductive health. Screening  You may have the following tests or measurements: Height, weight, and BMI. Blood pressure. Lipid and cholesterol levels. These may be checked every 5 years, or more frequently if you are over 9 years old. Skin check. Lung cancer screening. You may have this screening every year starting at age 82 if you have a 30-pack-year history of smoking and currently smoke or have quit within the past 15 years. Fecal occult blood test (FOBT) of the stool. You may have this test every year starting at age 52. Flexible sigmoidoscopy or colonoscopy. You may have a sigmoidoscopy every 5 years or a colonoscopy every 10 years starting at age 80. Hepatitis C blood test. Hepatitis B blood test. Sexually transmitted disease (STD) testing. Diabetes screening. This is done by checking your blood sugar (glucose) after you have not eaten for a while (fasting). You may have this done every 1-3 years. Bone density scan. This is done to screen for osteoporosis. You may have this done starting at age 35. Mammogram. This may be done every 1-2 years. Talk to your health care provider about how often you should have regular mammograms. Talk with your health care provider about your test results, treatment options, and if necessary, the need for more tests. Vaccines  Your health care provider may recommend certain vaccines, such as: Influenza vaccine. This is recommended every year. Tetanus, diphtheria, and acellular pertussis (Tdap, Td) vaccine. You may need a Td booster every 10 years. Zoster vaccine. You may need this after age  13. Pneumococcal 13-valent conjugate (PCV13) vaccine. One dose is recommended after age 89. Pneumococcal polysaccharide (PPSV23) vaccine. One dose is recommended after age 81. Talk to your health care provider about which screenings and vaccines you need and how often you need them. This information is not intended to replace advice given to you by your health care provider. Make sure you discuss any questions you have with your health care provider. Document Released: 01/21/2015 Document Revised: 09/14/2015 Document Reviewed: 10/26/2014 Elsevier Interactive Patient Education  2017 [place] Prevention in the Home Falls can cause injuries. They can happen to people of all ages. There are many things you can do to make your home safe and to help prevent falls. What can I do on the outside of my home? Regularly fix the edges of walkways and driveways and  fix any cracks. Remove anything that might make you trip as you walk through a door, such as a raised step or threshold. Trim any bushes or trees on the path to your home. Use bright outdoor lighting. Clear any walking paths of anything that might make someone trip, such as rocks or tools. Regularly check to see if handrails are loose or broken. Make sure that both sides of any steps have handrails. Any raised decks and porches should have guardrails on the edges. Have any leaves, snow, or ice cleared regularly. Use sand or salt on walking paths during winter. Clean up any spills in your garage right away. This includes oil or grease spills. What can I do in the bathroom? Use night lights. Install grab bars by the toilet and in the tub and shower. Do not use towel bars as grab bars. Use non-skid mats or decals in the tub or shower. If you need to sit down in the shower, use a plastic, non-slip stool. Keep the floor dry. Clean up any water that spills on the floor as soon as it happens. Remove soap buildup in the tub or shower  regularly. Attach bath mats securely with double-sided non-slip rug tape. Do not have throw rugs and other things on the floor that can make you trip. What can I do in the bedroom? Use night lights. Make sure that you have a light by your bed that is easy to reach. Do not use any sheets or blankets that are too big for your bed. They should not hang down onto the floor. Have a firm chair that has side arms. You can use this for support while you get dressed. Do not have throw rugs and other things on the floor that can make you trip. What can I do in the kitchen? Clean up any spills right away. Avoid walking on wet floors. Keep items that you use a lot in easy-to-reach places. If you need to reach something above you, use a strong step stool that has a grab bar. Keep electrical cords out of the way. Do not use floor polish or wax that makes floors slippery. If you must use wax, use non-skid floor wax. Do not have throw rugs and other things on the floor that can make you trip. What can I do with my stairs? Do not leave any items on the stairs. Make sure that there are handrails on both sides of the stairs and use them. Fix handrails that are broken or loose. Make sure that handrails are as long as the stairways. Check any carpeting to make sure that it is firmly attached to the stairs. Fix any carpet that is loose or worn. Avoid having throw rugs at the top or bottom of the stairs. If you do have throw rugs, attach them to the floor with carpet tape. Make sure that you have a light switch at the top of the stairs and the bottom of the stairs. If you do not have them, ask someone to add them for you. What else can I do to help prevent falls? Wear shoes that: Do not have high heels. Have rubber bottoms. Are comfortable and fit you well. Are closed at the toe. Do not wear sandals. If you use a stepladder: Make sure that it is fully opened. Do not climb a closed stepladder. Make sure that  both sides of the stepladder are locked into place. Ask someone to hold it for you, if possible. Clearly mark and  make sure that you can see: Any grab bars or handrails. First and last steps. Where the edge of each step is. Use tools that help you move around (mobility aids) if they are needed. These include: Canes. Walkers. Scooters. Crutches. Turn on the lights when you go into a dark area. Replace any light bulbs as soon as they burn out. Set up your furniture so you have a clear path. Avoid moving your furniture around. If any of your floors are uneven, fix them. If there are any pets around you, be aware of where they are. Review your medicines with your doctor. Some medicines can make you feel dizzy. This can increase your chance of falling. Ask your doctor what other things that you can do to help prevent falls. This information is not intended to replace advice given to you by your health care provider. Make sure you discuss any questions you have with your health care provider. Document Released: 10/21/2008 Document Revised: 06/02/2015 Document Reviewed: 01/29/2014 Elsevier Interactive Patient Education  2017 [employer].

## 2022-03-13 NOTE — Progress Notes (Signed)
I connected with  Deanna Salazar on 03/13/2022 at 2:00 p.m. EST by telephone and verified that I am speaking with the correct person using two identifiers.  Location: Patient: Home Provider: [place] Persons participating in the virtual visit: [place]   I discussed the limitations, risks, security and privacy concerns of performing an evaluation and management service by telephone and the availability of in person appointments. The patient expressed understanding and agreed to proceed.  Interactive audio and video telecommunications were attempted between this nurse and patient, however failed, due to patient having technical difficulties OR patient did not have access to video capability.  We continued and completed visit with audio only.  Some vital signs may be absent or patient reported.   Sheral Flow, LPN  Subjective:   Deanna Salazar is a 82 y.o. female who presents for Medicare Annual (Subsequent) preventive examination.  Review of Systems     Cardiac Risk Factors include: advanced age (>68mn, >>74women);diabetes mellitus;dyslipidemia;family history of premature cardiovascular disease;hypertension;obesity (BMI >30kg/m2)     Objective:    Today's Vitals   03/13/22 1405  Weight: 184 lb (83.5 kg)  Height: '5\' 3"'$  (1.6 m)  PainSc: 0-No pain   Body mass index is 32.59 kg/m.     03/13/2022    2:08 PM 12/04/2021    3:37 AM 03/07/2021   11:21 AM 02/23/2020    8:25 AM 02/01/2020   12:36 PM 09/08/2018    9:44 AM 08/23/2017   11:03 AM  Advanced Directives  Does Patient Have a Medical Advance Directive? No No No No No No No  Would patient like information on creating a medical advance directive? No - Patient declined  No - Patient declined No - Patient declined  No - Patient declined Yes (ED - Information included in AVS)    Current Medications (verified) Outpatient Encounter Medications as of 03/13/2022  Medication Sig   acetaminophen  (TYLENOL) 500 MG tablet Take 500-1,000 mg by mouth every 6 (six) hours as needed for mild pain, moderate pain, fever or headache.   amLODipine (NORVASC) 10 MG tablet Take 1 tablet (10 mg total) by mouth daily.   amoxicillin-clavulanate (AUGMENTIN) 875-125 MG tablet Take 1 tablet by mouth every 12 (twelve) hours.   aspirin (ASPIRIN CHILDRENS) 81 MG chewable tablet Chew 1 tablet (81 mg total) by mouth daily.   Blood Glucose Monitoring Suppl (ONE TOUCH ULTRA 2) w/Device KIT Use to check blood sugars daily   brimonidine (ALPHAGAN) 0.2 % ophthalmic solution Place 1 drop into both eyes in the morning and at bedtime.   carvedilol (COREG) 25 MG tablet TAKE 1 TABLET(25 MG) BY MOUTH TWICE DAILY WITH A MEAL   cetirizine (ZYRTEC ALLERGY) 10 MG tablet Take 1 tablet (10 mg total) by mouth daily.   cholecalciferol (VITAMIN D) 1000 units tablet Take 1,000 Units by mouth daily.   diclofenac Sodium (VOLTAREN) 1 % GEL Apply 2 g topically 4 (four) times daily.   famotidine (PEPCID) 40 MG tablet 1 tablet at bedtime. 1 tablet at bedtime.   folic acid (FOLVITE) 1 MG tablet Take 1 tablet daily   furosemide (LASIX) 20 MG tablet TAKE 1 TO 2 TABLETS(20 TO 40 MG) BY MOUTH DAILY AS NEEDED FOR SWELLING (Patient taking differently: Take 10 mg by mouth daily as needed for edema or fluid. Takes at least twice weekly)   glucose blood (ONETOUCH ULTRA) test strip TEST GLUCOSE LEVELS ONCE DAILY   ketoconazole (NIZORAL) 2 % cream Apply 1 Application  topically daily.   ketotifen (ZADITOR) 0.025 % ophthalmic solution Place 1 drop into both eyes 2 (two) times daily.   Lancets (ONETOUCH DELICA PLUS Q000111Q) MISC USE TO TEST BLOOD SUGAR LEVELS   latanoprost (XALATAN) 0.005 % ophthalmic solution Place 1 drop into both eyes at bedtime.   levothyroxine (SYNTHROID) 25 MCG tablet TAKE 1 TABLET BY MOUTH EVERY DAY BEFORE BREAKFAST   Lidocaine 4 % PTCH Apply 1 patch topically daily. 12 hours on and 12 hours off   meclizine (ANTIVERT) 25 MG  tablet TAKE 1 TABLET BY MOUTH THREE TIMES DAILY AS NEEDED FOR DIZZINESS OR NAUSEA   metFORMIN (GLUCOPHAGE) 500 MG tablet TAKE 1 TABLET(500 MG) BY MOUTH TWICE DAILY WITH A MEAL   methotrexate 2.5 MG tablet Take 20 mg by mouth once a week.   Multiple Vitamins-Minerals (PRESERVISION AREDS 2+MULTI VIT PO) Take 1 capsule by mouth in the morning and at bedtime.   Polyethyl Glycol-Propyl Glycol (SYSTANE OP) Apply 1 drop to eye as needed.   Polyethylene Glycol 3350 (MIRALAX PO) Take by mouth.   potassium chloride (KLOR-CON M) 10 MEQ tablet Take 1 tablet (10 mEq total) by mouth daily.   predniSONE (DELTASONE) 10 MG tablet Take 50 mg on Day #1 prn allergic reaction. Take 30 mg on Day #2.Take 20 mg on Day #3. Then stop   triamcinolone cream (KENALOG) 0.1 % Apply 1 Application topically 4 (four) times daily.   zolpidem (AMBIEN) 10 MG tablet Take 10 mg at at bedtime as needed.   No facility-administered encounter medications on file as of 03/13/2022.    Allergies (verified) Codeine, Hydrocodone, Hydrocodone-acetaminophen, Hydroxyzine, Lovastatin, Pantoprazole, Pepcid [famotidine], Telmisartan, Tramadol hcl, and Trazodone and nefazodone   History: Past Medical History:  Diagnosis Date   Abdominal pain, generalized 02/05/2008   ABDOMINAL PAIN-EPIGASTRIC 03/24/2009   Acute bronchitis 02/15/2007   Adenomatous colon polyp 04/2000   Arthritis    ASCUS favor benign 10/2011   negative high risk HPV screen   BACK PAIN 07/20/2008   Breast cancer ([place]) 2009   l, hx of XRT Dr Benay Spice, Left side   BREAST CANCER, HX OF 04/16/2007   CHEST PAIN 02/23/2010   COLONIC POLYPS, ADENOMATOUS, HX OF 10/01/2007   CONSTIPATION, CHRONIC 05/10/2007   Dysuria 04/06/2008   FATIGUE 12/26/2006   Fatty liver    FATTY LIVER DISEASE 11/19/2006   FIBROMYALGIA 11/19/2006   GERD 11/19/2006   Dr Fuller Plan   Glaucoma 07/2017   left eye   HEMATOCHEZIA 05/10/2007   HEMORRHOIDS, INTERNAL 05/10/2007   HIP PAIN 07/20/2008   HYPERLIPIDEMIA 07/26/2009    HYPERTENSION 12/26/2006   HYPOKALEMIA 11/25/2007   HYPOTHYROIDISM 12/26/2006   IBS 11/19/2006   INSOMNIA, PERSISTENT 12/26/2006   KELOID 07/25/2007   MURMUR 07/25/2007   Nausea alone 03/23/2010   PARESTHESIA 04/06/2008   Personal history of radiation therapy    RENAL CYST, LEFT 02/23/2010   Rheumatoid arthritis(714.0) 11/19/2006   Dr Vianne Bulls   RUQ PAIN 11/07/2009   Tubular adenoma    VISION IMPAIRMENT, LOW VISION, ONE EYE-LEFT 07/25/2007   Past Surgical History:  Procedure Laterality Date   BREAST LUMPECTOMY Left 2009   BREAST SURGERY  2009   Left lumpectomy   COLONOSCOPY  06/12/2012   Cyst excision from chest     knee surgery Left    Left thyroidectomy     OS Cataract extraction  2009   OVARIAN CYST REMOVAL  age 54   SHOULDER SURGERY     both shoulders   TOE  SURGERY     TUBAL LIGATION     Family History  Problem Relation Age of Onset   Lung cancer Brother    Colon cancer Brother 9       50's   Diabetes Father    Brain cancer Brother    Stroke Sister    Breast cancer Neg Hx    Social History   Socioeconomic History   Marital status: Widowed    Spouse name: Not on file   Number of children: 5   Years of education: Not on file   Highest education level: Not on file  Occupational History   Occupation: retired    Fish farm manager: RETIRED  Tobacco Use   Smoking status: Never   Smokeless tobacco: Never  Vaping Use   Vaping Use: Never used  Substance and Sexual Activity   Alcohol use: No    Alcohol/week: 0.0 standard drinks of alcohol   Drug use: No   Sexual activity: Never    Comment: 1st intercourse 82 yo-Fewer than 5 partners  Other Topics Concern   Not on file  Social History Narrative   Patient does not get regular exercise   Social Determinants of Health   Financial Resource Strain: Low Risk  (03/13/2022)   Overall Financial Resource Strain (CARDIA)    Difficulty of Paying Living Expenses: Not hard at all  Food Insecurity: No Food Insecurity (03/13/2022)    Hunger Vital Sign    Worried About Running Out of Food in the Last Year: Never true    [place] in the Last Year: Never true  Transportation Needs: No Transportation Needs (03/13/2022)   PRAPARE - Hydrologist (Medical): No    Lack of Transportation (Non-Medical): No  Physical Activity: Sufficiently Active (03/13/2022)   Exercise Vital Sign    Days of Exercise per Week: 5 days    Minutes of Exercise per Session: 30 min  Stress: No Stress Concern Present (03/13/2022)   [place]    Feeling of Stress : Not at all  Social Connections: Moderately Integrated (03/13/2022)   Social Connection and Isolation Panel [NHANES]    Frequency of Communication with Friends and Family: More than three times a week    Frequency of Social Gatherings with Friends and Family: More than three times a week    Attends Religious Services: More than 4 times per year    Active Member of [employer] or Organizations: No    Attends Music therapist: More than 4 times per year    Marital Status: Widowed    Tobacco Counseling Counseling given: Not Answered   Clinical Intake:  Pre-visit preparation completed: Yes  Pain : No/denies pain Pain Score: 0-No pain     BMI - recorded: 32.59 Nutritional Status: BMI > 30  Obese Nutritional Risks: None Diabetes: No  How often do you need to have someone help you when you read instructions, pamphlets, or other written materials from your doctor or pharmacy?: 1 - Never What is the last grade level you completed in school?: HSG  Nutrition Risk Assessment:  Has the patient had any N/V/D within the last 2 months?  No  Does the patient have any non-healing wounds?  No  Has the patient had any unintentional weight loss or weight gain?  No   Diabetes:  Is the patient diabetic?  Yes  If diabetic, was a CBG obtained today?  No  Did  the patient bring in their  glucometer from home?  No  How often do you monitor your CBG's? Once daily.   Financial Strains and Diabetes Management:  Are you having any financial strains with the device, your supplies or your medication? No .  Does the patient want to be seen by Chronic Care Management for management of their diabetes?  No  Would the patient like to be referred to a Nutritionist or for Diabetic Management?  No   Diabetic Exams:  Diabetic Eye Exam: Completed 10/20/2021 Diabetic Foot Exam: Completed 02/12/2022   Interpreter Needed?: No  Information entered by :: Lisette Abu, LPN.   Activities of Daily Living    03/13/2022    2:11 PM  In your present state of health, do you have any difficulty performing the following activities:  Hearing? 0  Vision? 0  Difficulty concentrating or making decisions? 0  Walking or climbing stairs? 0  Dressing or bathing? 0  Doing errands, shopping? 0  Preparing Food and eating ? N  Using the Toilet? N  In the past six months, have you accidently leaked urine? N  Comment wear just in case if going to be away from home for awhile.  Do you have problems with loss of bowel control? N  Managing your Medications? N  Managing your Finances? N  Housekeeping or managing your Housekeeping? N    Patient Care Team: Plotnikov, Evie Lacks, MD as PCP - General Hennie Duos, MD (Rheumatology) Newt Minion, MD (Orthopedic Surgery) Rosita Kea, PA-C (Inactive) (Rheumatology) Szabat, Darnelle Maffucci, [facility] (Inactive) as Pharmacist (Pharmacist) [place], P.A. as Consulting Physician (Ophthalmology)  Indicate any recent Medical Services you may have received from other than [facility] providers in the past year (date may be approximate).     Assessment:   This is a routine wellness examination for [place].  Hearing/Vision screen Hearing Screening - Comments:: Denies hearing difficulties   Vision Screening - Comments:: Wears rx glasses - up to date with  routine eye exams with Warden Fillers, MD.   Dietary issues and exercise activities discussed: Current Exercise Habits: Home exercise routine, Type of exercise: treadmill;walking, Time (Minutes): 30, Frequency (Times/Week): 5, Weekly Exercise (Minutes/Week): 150, Intensity: Moderate, Exercise limited by: None identified   Goals Addressed             This Visit's Progress    Monitor and Manage My Blood Sugar-Diabetes Type 2       Timeframe:  Long-Range Goal Priority:  High Start Date:  03/13/2022                       Expected End Date:  none  Follow Up Date 10/2022   - check blood sugar at prescribed times - check blood sugar if I feel it is too high or too low - enter blood sugar readings and medication or insulin into daily log - take the blood sugar log to all doctor visits - take the blood sugar meter to all doctor visits    Why is this important?   Checking your blood sugar at home helps to keep it from getting very high or very low.  Writing the results in a diary or log helps the doctor know how to care for you.  Your blood sugar log should have the time, date and the results.  Also, write down the amount of insulin or other medicine that you take.  Other information, like what you ate,  exercise done and how you were feeling, will also be helpful.        Depression Screen    03/13/2022    2:10 PM 03/05/2022    8:24 AM 10/30/2021    9:06 AM 09/08/2021    8:17 AM 07/27/2021    8:21 AM 05/04/2021    9:04 AM 03/07/2021   11:24 AM  PHQ 2/9 Scores  PHQ - 2 Score 0 0 1 0 0 0 0  PHQ- 9 Score 0  '6 5 7 5 '$ 0    Fall Risk    03/13/2022    2:09 PM 03/05/2022    8:23 AM 10/30/2021    9:06 AM 09/08/2021    8:17 AM 07/27/2021    8:21 AM  [place] in the past year? 0 0  0 0  Number falls in past yr: 0 0     Injury with Fall? 0 0  0 0  Risk for fall due to : No Fall Risks No Fall Risks No Fall Risks No Fall Risks   Follow up Falls prevention discussed Falls evaluation  completed  Falls evaluation completed     FALL RISK PREVENTION PERTAINING TO THE HOME:  Any stairs in or around the home? No  If so, are there any without handrails? No  Home free of loose throw rugs in walkways, pet beds, electrical cords, etc? Yes  Adequate lighting in your home to reduce risk of falls? Yes   ASSISTIVE DEVICES UTILIZED TO PREVENT FALLS:  Life alert? No  Use of a cane, walker or w/c? No  Grab bars in the bathroom? Yes  Shower chair or bench in shower? No  Elevated toilet seat or a handicapped toilet? Yes   TIMED UP AND GO:  Was the test performed? No . Telephonic Visit  Cognitive Function:    08/23/2017   11:04 AM 08/22/2016    9:31 AM  MMSE - Mini Mental State Exam  Orientation to time 5 5  Orientation to Place 5 5  Registration 3 3  Attention/ Calculation 5 4  Recall 3 2  Language- name 2 objects 2 2  Language- repeat 1 1  Language- follow 3 step command 3 3  Language- read & follow direction 1 1  Write a sentence 1 1  Copy design 1 1  Total score 30 28        03/13/2022    2:10 PM 09/08/2018    9:49 AM  6CIT Screen  What Year? 0 points 0 points  What month? 0 points 0 points  What time? 0 points 0 points  Count back from 20 0 points 0 points  Months in reverse 0 points 0 points  Repeat phrase 0 points 0 points  Total Score 0 points 0 points    Immunizations Immunization History  Administered Date(s) Administered   Fluad Quad(high Dose 65+) 09/08/2018, 11/05/2019, 10/11/2020, 09/28/2021   Influenza Split 10/30/2010, 01/16/2012   Influenza Whole 03/03/2009   Influenza, High Dose Seasonal PF 12/06/2014, 10/04/2015, 11/23/2016, 10/11/2017   Influenza,inj,Quad PF,6+ Mos 09/24/2012, 01/04/2014   PFIZER(Purple Top)SARS-COV-2 Vaccination 03/19/2019, 04/13/2019, 10/20/2019   PNEUMOCOCCAL CONJUGATE-20 08/17/2020   Pneumococcal Conjugate-13 04/05/2014   Pneumococcal Polysaccharide-23 12/06/2014   Td 04/05/2014   Zoster Recombinat (Shingrix)  04/29/2021, 07/21/2021    TDAP status: Up to date  Flu Vaccine status: Up to date  Pneumococcal vaccine status: Up to date  Covid-19 vaccine status: Completed vaccines  Qualifies for Shingles Vaccine? Yes  Zostavax completed No   Shingrix Completed?: Yes  Screening Tests Health Maintenance  Topic Date Due   Diabetic kidney evaluation - Urine ACR  02/22/2021   FOOT EXAM  02/22/2021   COVID-19 Vaccine (4 - 2023-24 season) 09/08/2021   OPHTHALMOLOGY EXAM  10/21/2022   Diabetic kidney evaluation - eGFR measurement  12/05/2022   Medicare Annual Wellness (AWV)  03/13/2023   DTaP/Tdap/Td (2 - Tdap) 04/04/2024   Pneumonia Vaccine 106+ Years old  Completed   INFLUENZA VACCINE  Completed   DEXA SCAN  Completed   Zoster Vaccines- Shingrix  Completed   HPV VACCINES  Aged Out    Health Maintenance  Health Maintenance Due  Topic Date Due   Diabetic kidney evaluation - Urine ACR  02/22/2021   FOOT EXAM  02/22/2021   COVID-19 Vaccine (4 - 2023-24 season) 09/08/2021    Colorectal cancer screening: No longer required.   Mammogram status: Completed 04/25/2021. Repeat every year  Bone Density status: Completed 11/10/2013. Results reflect: Bone density results: NORMAL. Repeat every 5 years.  Lung Cancer Screening: (Low Dose CT Chest recommended if Age 60-80 years, 30 pack-year currently smoking OR have quit w/in 15years.) does not qualify.   Lung Cancer Screening Referral: no  Additional Screening:  Hepatitis C Screening: does not qualify; Completed no  Vision Screening: Recommended annual ophthalmology exams for early detection of glaucoma and other disorders of the eye. Is the patient up to date with their annual eye exam?  Yes  Who is the provider or what is the name of the office in which the patient attends annual eye exams? Warden Fillers, MD. If pt is not established with a provider, would they like to be referred to a provider to establish care? No .   Dental  Screening: Recommended annual dental exams for proper oral hygiene  Community Resource Referral / Chronic Care Management: CRR required this visit?  No   CCM required this visit?  No      Plan:     I have personally reviewed and noted the following in the patient's chart:   Medical and social history Use of alcohol, tobacco or illicit drugs  Current medications and supplements including opioid prescriptions. Patient is not currently taking opioid prescriptions. Functional ability and status Nutritional status Physical activity Advanced directives List of other physicians Hospitalizations, surgeries, and ER visits in previous 12 months Vitals Screenings to include cognitive, depression, and falls Referrals and appointments  In addition, I have reviewed and discussed with patient certain preventive protocols, quality metrics, and best practice recommendations. A written personalized care plan for preventive services as well as general preventive health recommendations were provided to patient.     Sheral Flow, LPN   [id number]   Nurse Notes:  Normal cognitive status assessed by direct observation by this Nurse Health Advisor. No abnormalities found.

## 2022-03-21 ENCOUNTER — Other Ambulatory Visit: Payer: Self-pay | Admitting: Internal Medicine

## 2022-03-30 ENCOUNTER — Other Ambulatory Visit: Payer: Self-pay | Admitting: Internal Medicine

## 2022-03-30 DIAGNOSIS — Z1231 Encounter for screening mammogram for malignant neoplasm of breast: Secondary | ICD-10-CM

## 2022-04-25 ENCOUNTER — Telehealth: Payer: Self-pay

## 2022-04-26 NOTE — Telephone Encounter (Signed)
Patient has been updated with physician's recommendations, verbalized understanding.

## 2022-05-04 ENCOUNTER — Ambulatory Visit
Admission: RE | Admit: 2022-05-04 | Discharge: 2022-05-04 | Disposition: A | Discharge status: Home / Self Care | Payer: PPO | Source: Ambulatory Visit | Attending: Internal Medicine | Admitting: Internal Medicine

## 2022-05-04 DIAGNOSIS — Z1231 Encounter for screening mammogram for malignant neoplasm of breast: Secondary | ICD-10-CM | POA: Diagnosis not present

## 2022-05-09 ENCOUNTER — Other Ambulatory Visit: Payer: Self-pay | Admitting: Internal Medicine

## 2022-05-14 ENCOUNTER — Ambulatory Visit (INDEPENDENT_AMBULATORY_CARE_PROVIDER_SITE_OTHER): Payer: PPO | Admitting: Podiatry

## 2022-05-14 DIAGNOSIS — B351 Tinea unguium: Secondary | ICD-10-CM | POA: Diagnosis not present

## 2022-05-14 DIAGNOSIS — M79675 Pain in left toe(s): Secondary | ICD-10-CM

## 2022-05-14 DIAGNOSIS — M79674 Pain in right toe(s): Secondary | ICD-10-CM | POA: Diagnosis not present

## 2022-05-14 DIAGNOSIS — E119 Type 2 diabetes mellitus without complications: Secondary | ICD-10-CM

## 2022-05-14 NOTE — Patient Instructions (Signed)
You can also use UREA NAIL GEL  on the toenail

## 2022-05-16 NOTE — Progress Notes (Signed)
Subjective: Chief Complaint  Patient presents with   Nail Problem    Thick painful toenails, 3 month follow up    82 year old female presents the office for above concerns.  She states the calluses have also started to come back causing pain.  No open lesions.  No swelling redness or drainage.  Objective: AAO x3, NAD DP/PT pulses palpable bilaterally, CRT less than 3 seconds Hyperkeratotic lesion noted submetatarsal 3 without any underlying ulceration drainage or sign of infection.   The toenails are dystrophic with yellow, brown discoloration.  There is no edema, erythema or signs of infection.  Nails appear to be somewhat more soft in nature and slightly improved in color. No pain with calf compression, swelling, warmth, erythema  Assessment: Skin lesion; symptomatic onychomycosis  Plan: -All treatment options discussed with the patient including all alternatives, risks, complications.  -Sharply debrided the nails x 10 without any complications or bleeding.  Continue topical medication.  Discussed he can also use urea gel. -Sharply debrided the hyperkeratotic lesion without any complications or bleeding.  Moisturizer, offloading daily. -Patient encouraged to call the office with any questions, concerns, change in symptoms.   Vivi Barrack DPM

## 2022-05-24 ENCOUNTER — Ambulatory Visit (INDEPENDENT_AMBULATORY_CARE_PROVIDER_SITE_OTHER): Payer: PPO | Admitting: Internal Medicine

## 2022-05-24 ENCOUNTER — Encounter: Payer: Self-pay | Admitting: Internal Medicine

## 2022-05-24 VITALS — BP 120/70 | HR 67 | Temp 98.3°F | Ht 63.0 in | Wt 176.0 lb

## 2022-05-24 DIAGNOSIS — F5101 Primary insomnia: Secondary | ICD-10-CM | POA: Diagnosis not present

## 2022-05-24 DIAGNOSIS — E039 Hypothyroidism, unspecified: Secondary | ICD-10-CM

## 2022-05-24 DIAGNOSIS — R5382 Chronic fatigue, unspecified: Secondary | ICD-10-CM

## 2022-05-24 DIAGNOSIS — M06032 Rheumatoid arthritis without rheumatoid factor, left wrist: Secondary | ICD-10-CM | POA: Diagnosis not present

## 2022-05-24 DIAGNOSIS — E119 Type 2 diabetes mellitus without complications: Secondary | ICD-10-CM

## 2022-05-24 DIAGNOSIS — M06031 Rheumatoid arthritis without rheumatoid factor, right wrist: Secondary | ICD-10-CM

## 2022-05-24 DIAGNOSIS — M797 Fibromyalgia: Secondary | ICD-10-CM | POA: Diagnosis not present

## 2022-05-24 LAB — MICROALBUMIN / CREATININE URINE RATIO
Creatinine,U: 48.6 mg/dL
Microalb Creat Ratio: 1.4 mg/g (ref 0.0–30.0)
Microalb, Ur: <0.7 mg/dL (ref 0.0–1.9)

## 2022-05-24 LAB — COMPREHENSIVE METABOLIC PANEL
ALT: 10 U/L (ref 0–35)
AST: 19 U/L (ref 0–37)
Albumin: 4 g/dL (ref 3.5–5.2)
Alkaline Phosphatase: 72 U/L (ref 39–117)
BUN: 14 mg/dL (ref 6–23)
CO2: 31 mEq/L (ref 19–32)
Calcium: 10 mg/dL (ref 8.4–10.5)
Chloride: 104 mEq/L (ref 96–112)
Creatinine, Ser: 0.97 mg/dL (ref 0.40–1.20)
GFR: 54.33 mL/min — ABNORMAL LOW (ref >60.00)
Glucose, Bld: 97 mg/dL (ref 70–99)
Potassium: 4.2 mEq/L (ref 3.5–5.1)
Sodium: 140 mEq/L (ref 135–145)
Total Bilirubin: 0.5 mg/dL (ref 0.2–1.2)
Total Protein: 6.6 g/dL (ref 6.0–8.3)

## 2022-05-24 LAB — CBC WITH DIFFERENTIAL/PLATELET
Basophils Absolute: 0 10*3/uL (ref 0.0–0.1)
Basophils Relative: 0.7 % (ref 0.0–3.0)
Eosinophils Absolute: 0.2 10*3/uL (ref 0.0–0.7)
Eosinophils Relative: 3.9 % (ref 0.0–5.0)
HCT: 35.3 % — ABNORMAL LOW (ref 36.0–46.0)
Hemoglobin: 11.4 g/dL — ABNORMAL LOW (ref 12.0–15.0)
Lymphocytes Relative: 29.2 % (ref 12.0–46.0)
Lymphs Abs: 1.4 10*3/uL (ref 0.7–4.0)
MCHC: 32.3 g/dL (ref 30.0–36.0)
MCV: 94.1 fl (ref 78.0–100.0)
Monocytes Absolute: 0.6 10*3/uL (ref 0.1–1.0)
Monocytes Relative: 13 % — ABNORMAL HIGH (ref 3.0–12.0)
Neutro Abs: 2.5 10*3/uL (ref 1.4–7.7)
Neutrophils Relative %: 53.2 % (ref 43.0–77.0)
Platelets: 208 10*3/uL (ref 150.0–400.0)
RBC: 3.75 Mil/uL — ABNORMAL LOW (ref 3.87–5.11)
RDW: 15.1 % (ref 11.5–15.5)
WBC: 4.8 10*3/uL (ref 4.0–10.5)

## 2022-05-24 LAB — URINALYSIS
Bilirubin Urine: NEGATIVE
Hgb urine dipstick: NEGATIVE
Ketones, ur: NEGATIVE
Leukocytes,Ua: NEGATIVE
Nitrite: NEGATIVE
Specific Gravity, Urine: <=1.005 — AB (ref 1.000–1.030)
Total Protein, Urine: NEGATIVE
Urine Glucose: NEGATIVE
Urobilinogen, UA: 0.2 (ref 0.0–1.0)
pH: 6 (ref 5.0–8.0)

## 2022-05-24 LAB — HEMOGLOBIN A1C: Hgb A1c MFr Bld: 5.7 % (ref 4.6–6.5)

## 2022-05-24 MED ORDER — FUROSEMIDE 20 MG PO TABS: ORAL_TABLET | ORAL | 5 refills | Status: DC

## 2022-05-24 MED ORDER — FUROSEMIDE 20 MG PO TABS: ORAL_TABLET | ORAL | 11 refills | Status: DC

## 2022-05-24 NOTE — Assessment & Plan Note (Signed)
We are working on insomnia Rx Pt needs 7-8 hrs of sleep to feel better

## 2022-05-24 NOTE — Assessment & Plan Note (Signed)
On MTX Will get labs

## 2022-05-24 NOTE — Patient Instructions (Signed)
USEFUL THINGS FOR HAND ARTHRITIS  RICE SOCK HEATING PAD: [email or web address]/Homemade-[email or web address]    SILICONE PADS:    BRIX JAR OPENER:    NITRILE COATED GARDEN GLOVES:   THUMB BRACE:     BLUE EMU CREAM:    

## 2022-05-24 NOTE — Assessment & Plan Note (Signed)
Pt needs 7-8 hrs of sleep to feel better

## 2022-05-24 NOTE — Assessment & Plan Note (Addendum)
Continue on Zolpidem

## 2022-05-24 NOTE — Assessment & Plan Note (Signed)
Check TSH 

## 2022-05-24 NOTE — Progress Notes (Signed)
Subjective:  Patient ID: Deanna Salazar, female    DOB: 1940/06/09  Age: 82 y.o. MRN: [id number]  CC: Labs Only   HPI [employer] presents for insomnia and nightmares - needs 7-8 hrs of sleep to feel better  Outpatient Medications Prior to Visit  Medication Sig Dispense Refill   acetaminophen (TYLENOL) 500 MG tablet Take 500-1,000 mg by mouth every 6 (six) hours as needed for mild pain, moderate pain, fever or headache.     amLODipine (NORVASC) 10 MG tablet Take 1 tablet (10 mg total) by mouth daily. 90 tablet 1   aspirin (ASPIRIN CHILDRENS) 81 MG chewable tablet Chew 1 tablet (81 mg total) by mouth daily. 36 tablet 11   Blood Glucose Monitoring Suppl (ONE TOUCH ULTRA 2) w/Device KIT Use to check blood sugars daily 1 kit 0   brimonidine (ALPHAGAN) 0.2 % ophthalmic solution Place 1 drop into both eyes in the morning and at bedtime.     carvedilol (COREG) 25 MG tablet TAKE 1 TABLET(25 MG) BY MOUTH TWICE DAILY WITH A MEAL 180 tablet 1   cholecalciferol (VITAMIN D) 1000 units tablet Take 1,000 Units by mouth daily.     diclofenac Sodium (VOLTAREN) 1 % GEL Apply 2 g topically 4 (four) times daily.     famotidine (PEPCID) 40 MG tablet TAKE 1 TABLET BY MOUTH EVERY DAY 30 tablet 5   folic acid (FOLVITE) 1 MG tablet Take 1 tablet daily     glucose blood (ONETOUCH ULTRA) test strip TEST GLUCOSE LEVELS ONCE DAILY 100 strip 3   Lancets (ONETOUCH DELICA PLUS LANCET30G) MISC USE TO TEST BLOOD SUGAR LEVELS 100 each 2   latanoprost (XALATAN) 0.005 % ophthalmic solution Place 1 drop into both eyes at bedtime.  3   levothyroxine (SYNTHROID) 25 MCG tablet TAKE 1 TABLET BY MOUTH EVERY DAY BEFORE BREAKFAST 90 tablet 2   Lidocaine 4 % PTCH Apply 1 patch topically daily. 12 hours on and 12 hours off     metFORMIN (GLUCOPHAGE) 500 MG tablet TAKE 1 TABLET BY MOUTH TWICE A DAY WITH A MEAL 180 tablet 1   methotrexate 2.5 MG tablet Take 20 mg by mouth once a week.     Multiple Vitamins-Minerals (PRESERVISION  AREDS 2+MULTI VIT PO) Take 1 capsule by mouth in the morning and at bedtime.     Polyethyl Glycol-Propyl Glycol (SYSTANE OP) Apply 1 drop to eye as needed.     Polyethylene Glycol 3350 (MIRALAX PO) Take by mouth.     potassium chloride (KLOR-CON M) 10 MEQ tablet Take 1 tablet (10 mEq total) by mouth daily. 90 tablet 3   triamcinolone cream (KENALOG) 0.1 % Apply 1 Application topically 4 (four) times daily. 80 g 3   zolpidem (AMBIEN) 10 MG tablet Take 10 mg at at bedtime as needed. 30 tablet 5   cetirizine (ZYRTEC ALLERGY) 10 MG tablet Take 1 tablet (10 mg total) by mouth daily. 30 tablet 3   furosemide (LASIX) 20 MG tablet TAKE 1 TO 2 TABLETS(20 TO 40 MG) BY MOUTH DAILY AS NEEDED FOR SWELLING (Patient taking differently: Take 10 mg by mouth daily as needed for edema or fluid. Takes at least twice weekly) 60 tablet 5   ketoconazole (NIZORAL) 2 % cream Apply 1 Application topically daily. 60 g 0   ketotifen (ZADITOR) 0.025 % ophthalmic solution Place 1 drop into both eyes 2 (two) times daily.     meclizine (ANTIVERT) 25 MG tablet TAKE 1 TABLET BY MOUTH THREE  TIMES DAILY AS NEEDED FOR DIZZINESS OR NAUSEA 30 tablet 3   predniSONE (DELTASONE) 10 MG tablet Take 50 mg on Day #1 prn allergic reaction. Take 30 mg on Day #2.Take 20 mg on Day #3. Then stop 10 tablet 1   amoxicillin-clavulanate (AUGMENTIN) 875-125 MG tablet Take 1 tablet by mouth every 12 (twelve) hours. (Patient not taking: Reported on 05/24/2022) 14 tablet 0   No facility-administered medications prior to visit.    ROS: Review of Systems  Constitutional:  Positive for fatigue. Negative for activity change, appetite change, chills and unexpected weight change.  HENT:  Negative for congestion, mouth sores and sinus pressure.   Eyes:  Negative for visual disturbance.  Respiratory:  Negative for cough and chest tightness.   Gastrointestinal:  Negative for abdominal pain and nausea.  Genitourinary:  Negative for difficulty urinating,  frequency and vaginal pain.  Musculoskeletal:  Positive for arthralgias. Negative for back pain and gait problem.  Skin:  Negative for pallor and rash.  Neurological:  Negative for dizziness, tremors, weakness, numbness and headaches.  Psychiatric/Behavioral:  Positive for sleep disturbance. Negative for confusion and suicidal ideas. The patient is not nervous/anxious.     Objective:  BP 120/70 (BP Location: Right Arm, Patient Position: Sitting, Cuff Size: Normal)   Pulse 67   Temp 98.3 F (36.8 C) (Oral)   Ht 5\' 3"  (1.6 m)   Wt 176 lb (79.8 kg)   SpO2 96%   BMI 31.18 kg/m   BP Readings from Last 3 Encounters:  05/24/22 120/70  03/05/22 118/70  02/12/22 139/72    Wt Readings from Last 3 Encounters:  05/24/22 176 lb (79.8 kg)  03/13/22 184 lb (83.5 kg)  03/05/22 184 lb (83.5 kg)    Physical Exam Constitutional:      General: She is not in acute distress.    Appearance: She is well-developed. She is obese.  HENT:     Head: Normocephalic.     Right Ear: External ear normal.     Left Ear: External ear normal.     Nose: Nose normal.  Eyes:     General:        Right eye: No discharge.        Left eye: No discharge.     Conjunctiva/sclera: Conjunctivae normal.     Pupils: Pupils are equal, round, and reactive to light.  Neck:     Thyroid: No thyromegaly.     Vascular: No JVD.     Trachea: No tracheal deviation.  Cardiovascular:     Rate and Rhythm: Normal rate and regular rhythm.     Heart sounds: Normal heart sounds.  Pulmonary:     Effort: No respiratory distress.     Breath sounds: No stridor. No wheezing.  Abdominal:     General: Bowel sounds are normal. There is no distension.     Palpations: Abdomen is soft. There is no mass.     Tenderness: There is no abdominal tenderness. There is no guarding or rebound.  Musculoskeletal:        General: Tenderness present.     Cervical back: Normal range of motion and neck supple. No rigidity.  Lymphadenopathy:      Cervical: No cervical adenopathy.  Skin:    Findings: No erythema or rash.  Neurological:     Mental Status: She is oriented to person, place, and time.     Cranial Nerves: No cranial nerve deficit.     Motor: No abnormal muscle tone.  Coordination: Coordination normal.     Deep Tendon Reflexes: Reflexes normal.  Psychiatric:        Behavior: Behavior normal.        Thought Content: Thought content normal.        Judgment: Judgment normal.     Lab Results  Component Value Date   WBC 8.3 12/04/2021   HGB 12.2 12/04/2021   HCT 36.5 12/04/2021   PLT 205 12/04/2021   GLUCOSE 130 (H) 12/04/2021   CHOL 187 08/17/2020   TRIG 132.0 08/17/2020   HDL 46.00 08/17/2020   LDLDIRECT 180.9 07/26/2009   LDLCALC 114 (H) 08/17/2020   ALT 12 12/04/2021   AST 18 12/04/2021   NA 138 12/04/2021   K 3.6 12/04/2021   CL 105 12/04/2021   CREATININE 0.96 12/04/2021   BUN 14 12/04/2021   CO2 22 12/04/2021   TSH 3.03 10/30/2021   HGBA1C 5.9 10/30/2021   MICROALBUR <0.7 02/23/2020    MM 3D SCREENING MAMMOGRAM BILATERAL BREAST  Result Date: 05/07/2022 CLINICAL DATA:  Screening. EXAM: DIGITAL SCREENING BILATERAL MAMMOGRAM WITH TOMOSYNTHESIS AND CAD TECHNIQUE: Bilateral screening digital craniocaudal and mediolateral oblique mammograms were obtained. Bilateral screening digital breast tomosynthesis was performed. The images were evaluated with computer-aided detection. COMPARISON:  Previous exam(s). ACR Breast Density Category b: There are scattered areas of fibroglandular density. FINDINGS: There are no findings suspicious for malignancy. IMPRESSION: No mammographic evidence of malignancy. A result letter of this screening mammogram will be mailed directly to the patient. RECOMMENDATION: Screening mammogram in one year. (Code:SM-B-01Y) BI-RADS CATEGORY  1: Negative. Electronically Signed   By: Bary Richard M.D.   On: 05/07/2022 14:40    Assessment & Plan:   Problem List Items Addressed This  Visit     Hypothyroidism    Check TSH      Insomnia    Continue on Zolpidem        Rheumatoid arthritis (HCC)    On MTX Will get labs      Relevant Orders   CBC with Differential/Platelet   Comprehensive metabolic panel   Fibromyalgia    Pt needs 7-8 hrs of sleep to feel better      Chronic fatigue    We are working on insomnia Rx Pt needs 7-8 hrs of sleep to feel better      Diabetes type 2, controlled (HCC) - Primary    Obtain labs      Relevant Orders   Urine microalbumin-creatinine with uACR   Hemoglobin A1c   Urinalysis      Meds ordered this encounter  Medications   DISCONTD: furosemide (LASIX) 20 MG tablet    Sig: TAKE 1 TO 2 TABLETS(20 TO 40 MG) BY MOUTH DAILY AS NEEDED FOR SWELLING Strength: 20 mg    Dispense:  60 tablet    Refill:  5   furosemide (LASIX) 20 MG tablet    Sig: TAKE 1 TO 2 TABLETS(20 TO 40 MG) BY MOUTH DAILY AS NEEDED FOR SWELLING Strength: 20 mg    Dispense:  60 tablet    Refill:  11      Follow-up: Return in about 3 months (around 08/24/2022) for a follow-up visit.  Sonda Primes, MD

## 2022-05-24 NOTE — Assessment & Plan Note (Signed)
Obtain labs.

## 2022-05-28 ENCOUNTER — Telehealth: Payer: Self-pay | Admitting: Internal Medicine

## 2022-05-28 MED ORDER — FUROSEMIDE 20 MG PO TABS: ORAL_TABLET | ORAL | 11 refills | Status: DC

## 2022-05-28 NOTE — Telephone Encounter (Signed)
Patient said that [facility] did not receive her prescription for lasix that was sent on 05/24/2022.  Please resend

## 2022-05-28 NOTE — Telephone Encounter (Signed)
MD had on print. Resubmitted script to [facility]../lmb

## 2022-06-07 ENCOUNTER — Telehealth: Payer: Self-pay | Admitting: Internal Medicine

## 2022-06-07 NOTE — Telephone Encounter (Signed)
Notified pt fax over labs to rheumatologist../lmb

## 2022-06-07 NOTE — Telephone Encounter (Signed)
Patient called and asked that a copy of her latest labs be sent to her rheumatologist Dr. Azucena Fallen - she can not het her medications until this doctor receives her labs.   FAX:  [phone]

## 2022-06-20 DIAGNOSIS — Z961 Presence of intraocular lens: Secondary | ICD-10-CM | POA: Diagnosis not present

## 2022-06-20 DIAGNOSIS — H401122 Primary open-angle glaucoma, left eye, moderate stage: Secondary | ICD-10-CM | POA: Diagnosis not present

## 2022-06-20 DIAGNOSIS — H0288B Meibomian gland dysfunction left eye, upper and lower eyelids: Secondary | ICD-10-CM | POA: Diagnosis not present

## 2022-06-20 DIAGNOSIS — H209 Unspecified iridocyclitis: Secondary | ICD-10-CM | POA: Diagnosis not present

## 2022-06-20 DIAGNOSIS — H0288A Meibomian gland dysfunction right eye, upper and lower eyelids: Secondary | ICD-10-CM | POA: Diagnosis not present

## 2022-06-20 DIAGNOSIS — E119 Type 2 diabetes mellitus without complications: Secondary | ICD-10-CM | POA: Diagnosis not present

## 2022-06-20 DIAGNOSIS — H1045 Other chronic allergic conjunctivitis: Secondary | ICD-10-CM | POA: Diagnosis not present

## 2022-06-20 DIAGNOSIS — H401111 Primary open-angle glaucoma, right eye, mild stage: Secondary | ICD-10-CM | POA: Diagnosis not present

## 2022-08-03 ENCOUNTER — Telehealth: Payer: PPO | Admitting: Family Medicine

## 2022-08-03 DIAGNOSIS — U071 COVID-19: Secondary | ICD-10-CM | POA: Diagnosis not present

## 2022-08-03 MED ORDER — BENZONATATE 200 MG PO CAPS: 200.0000 mg | ORAL_CAPSULE | Freq: Two times a day (BID) | ORAL | 0 refills | Status: DC | PRN

## 2022-08-03 MED ORDER — FLUTICASONE PROPIONATE 50 MCG/ACT NA SUSP: 2.0000 | Freq: Every day | NASAL | 6 refills | Status: AC

## 2022-08-03 NOTE — Progress Notes (Signed)
E-Visit  for Positive Covid Test Result   We are sorry you are not feeling well. We are here to help!  You have tested positive for COVID-19, meaning that you were infected with the novel coronavirus and could give the virus to others.  Most people with COVID-19 have mild illness and can recover at home without medical care. Do not leave your home, except to get medical care. Do not visit public areas and do not go to places where you are unable to wear a mask. It is important that you stay home  to take care for yourself and to help protect other people in your home and community.      Isolation Instructions:   You are to isolate at home until you have been fever free for at least 24 hours without a fever-reducing medication, and symptoms have been steadily improving for 24 hours. At that time,  you can end isolation but need to mask for an additional 5 days.  If you must be around other household members who do not have symptoms, you need to make sure that both you and the family members are masking consistently with a high-quality mask.  If you note any worsening of symptoms despite treatment, please seek an in-person evaluation ASAP. If you note any significant shortness of breath or any chest pain, please seek ER evaluation. Please do not delay care!   Go to the nearest hospital ED for assessment if fever/cough/breathlessness are severe or illness seems like a threat to life.    The following symptoms may appear 2-14 days after exposure: Fever Cough Shortness of breath or difficulty breathing Chills Repeated shaking with chills Muscle pain Headache Sore throat New loss of taste or smell Fatigue Congestion or runny nose Nausea or vomiting Diarrhea  You can use medication such as prescription cough medication called Tessalon Perles 100 mg. You may take 1-2 capsules every 8 hours as needed for cough and prescription for Fluticasone nasal spray 2 sprays in each nostril one time  per day  You may also take acetaminophen (Tylenol) as needed for fever.  HOME CARE: Only take medications as instructed by your medical team. Drink plenty of fluids and get plenty of rest. A steam or ultrasonic humidifier can help if you have congestion.   GET HELP RIGHT AWAY IF YOU HAVE EMERGENCY WARNING SIGNS.  Call 911 or proceed to your closest emergency facility if: You develop worsening high fever. Trouble breathing Bluish lips or face Persistent pain or pressure in the chest New confusion Inability to wake or stay awake You cough up blood. Your symptoms become more severe Inability to hold down food or fluids  This list is not all possible symptoms. Contact your medical provider for any symptoms that are severe or concerning to you.   Your e-visit answers were reviewed by a board certified advanced clinical practitioner to complete your personal care plan.  Depending on the condition, your plan could have included both over the counter or prescription medications.  If there is a problem please reply once you have received a response from your provider.  Your safety is important to [place].  If you have drug allergies check your prescription carefully.    You can use MyChart to ask questions about today's visit, request a non-urgent call back, or ask for a work or school excuse for 24 hours related to this e-Visit. If it has been greater than 24 hours you will need to follow up with your  provider, or enter a new e-Visit to address those concerns. You will get an e-mail in the next two days asking about your experience.  I hope that your e-visit has been valuable and will speed your recovery. Thank you for using e-visits.    have provided 5 minutes of non face to face time during this encounter for chart review and documentation.

## 2022-08-05 ENCOUNTER — Encounter: Payer: Self-pay | Admitting: Emergency Medicine

## 2022-08-05 ENCOUNTER — Other Ambulatory Visit: Payer: Self-pay

## 2022-08-05 ENCOUNTER — Ambulatory Visit
Admission: EM | Admit: 2022-08-05 | Discharge: 2022-08-05 | Disposition: A | Discharge status: Home / Self Care | Payer: PPO | Attending: Physician Assistant | Admitting: Physician Assistant

## 2022-08-05 DIAGNOSIS — Z1152 Encounter for screening for COVID-19: Secondary | ICD-10-CM | POA: Diagnosis not present

## 2022-08-05 NOTE — ED Notes (Signed)
Patient is concerned patient has pneumonia and wants to know for certain if she has covid or not-patient's tests have been negative

## 2022-08-05 NOTE — ED Provider Notes (Signed)
[name]    CSN: [id number] Arrival date & time: 08/05/22  1032      History   Chief Complaint Chief Complaint  Patient presents with   Cough    HPI Deanna Salazar is a 82 y.o. female.   Patient here today for evaluation of cough, congestion body aches and headache that started a few days ago.  She reports that her daughter has tested positive for COVID.  She has tested negative thus far at home but states symptoms have recently worsened.  She reports cough is nonproductive.  She does not report treatment for symptoms.  The history is provided by the patient.  Cough Associated symptoms: myalgias and sore throat   Associated symptoms: no chills, no ear pain, no eye discharge, no fever, no shortness of breath and no wheezing     Past Medical History:  Diagnosis Date   Abdominal pain, generalized 02/05/2008   ABDOMINAL PAIN-EPIGASTRIC 03/24/2009   Acute bronchitis 02/15/2007   Adenomatous colon polyp 04/2000   Arthritis    ASCUS favor benign 10/2011   negative high risk HPV screen   BACK PAIN 07/20/2008   Breast cancer (HCC) 2009   l, hx of XRT Dr Truett Perna, Left side   BREAST CANCER, HX OF 04/16/2007   CHEST PAIN 02/23/2010   COLONIC POLYPS, ADENOMATOUS, HX OF 10/01/2007   CONSTIPATION, CHRONIC 05/10/2007   Dysuria 04/06/2008   FATIGUE 12/26/2006   Fatty liver    FATTY LIVER DISEASE 11/19/2006   FIBROMYALGIA 11/19/2006   GERD 11/19/2006   Dr Russella Dar   Glaucoma 07/2017   left eye   HEMATOCHEZIA 05/10/2007   HEMORRHOIDS, INTERNAL 05/10/2007   HIP PAIN 07/20/2008   HYPERLIPIDEMIA 07/26/2009   HYPERTENSION 12/26/2006   HYPOKALEMIA 11/25/2007   HYPOTHYROIDISM 12/26/2006   IBS 11/19/2006   INSOMNIA, PERSISTENT 12/26/2006   KELOID 07/25/2007   MURMUR 07/25/2007   Nausea alone 03/23/2010   PARESTHESIA 04/06/2008   Personal history of radiation therapy    RENAL CYST, LEFT 02/23/2010   Rheumatoid arthritis(714.0) 11/19/2006   Dr Mardene Sayer   RUQ PAIN 11/07/2009   Tubular  adenoma    VISION IMPAIRMENT, LOW VISION, ONE EYE-LEFT 07/25/2007    Patient Active Problem List   Diagnosis Date Noted   Adult-onset obesity 03/05/2022   Foot pain, left 10/30/2021   Tongue swelling 09/28/2021   Lip swelling 09/28/2021   Angioedema 09/28/2021   Rash in adult 09/08/2021   Headache 07/27/2021   Weight loss 02/23/2021   CRF (chronic renal failure), stage 3 (moderate) (HCC) 02/25/2020   Nightmares 04/22/2019   Exposure to COVID-19 virus 10/09/2018   Cough 10/09/2018   Grief 11/23/2016   Unilateral primary osteoarthritis, right knee 08/23/2016   Hypokalemia 05/16/2016   Rhonchi 02/09/2016   Intertrigo 10/04/2015   Allergic rhinitis 04/05/2015   Well adult exam 06/28/2014   Tinea pedis 10/13/2013   Near syncope 09/28/2013   LBP (low back pain) 09/24/2012   Diabetes type 2, controlled (HCC) 11/06/2011   Breast cancer (HCC) 11/06/2011   URI, acute 03/02/2011   Flank pain 03/02/2011   Hyperglycemia 03/02/2011   Edema 10/30/2010   Dyspnea and respiratory abnormalities 10/30/2010   Cramp in limb 10/30/2010   Bronchitis 10/06/2010   NAUSEA ALONE 03/23/2010   RENAL CYST, LEFT 02/23/2010   Chest pain 02/23/2010   Dyslipidemia 07/26/2009   ABDOMINAL PAIN-EPIGASTRIC 03/24/2009   HIP PAIN 07/20/2008   BACK PAIN 07/20/2008   FEVER, NOS 07/20/2008   PARESTHESIA 04/06/2008  Dysuria 04/06/2008   Dysphagia, pharyngoesophageal phase 02/05/2008   ABDOMINAL PAIN, GENERALIZED 02/05/2008   HYPOKALEMIA 11/25/2007   Depression 11/25/2007   COLONIC POLYPS, ADENOMATOUS, HX OF 10/01/2007   VISION IMPAIRMENT, LOW VISION, ONE EYE-LEFT 07/25/2007   KELOID 07/25/2007   MURMUR 07/25/2007   HEMORRHOIDS, INTERNAL 05/10/2007   CONSTIPATION, CHRONIC 05/10/2007   BREAST CANCER, HX OF 04/16/2007   ACUTE BRONCHITIS 02/15/2007   Hypothyroidism 12/26/2006   Insomnia 12/26/2006   Essential hypertension 12/26/2006   Chronic fatigue 12/26/2006   GERD 11/19/2006   IBS 11/19/2006    FATTY LIVER DISEASE 11/19/2006   Rheumatoid arthritis (HCC) 11/19/2006   Fibromyalgia 11/19/2006    Past Surgical History:  Procedure Laterality Date   BREAST LUMPECTOMY Left 2009   BREAST SURGERY  2009   Left lumpectomy   COLONOSCOPY  06/12/2012   Cyst excision from chest     knee surgery Left    Left thyroidectomy     OS Cataract extraction  2009   OVARIAN CYST REMOVAL  age 41   SHOULDER SURGERY     both shoulders   TOE SURGERY     TUBAL LIGATION      OB History     Gravida  5   Para  5   Term      Preterm      AB      Living  5      SAB      IAB      Ectopic      Multiple      Live Births               Home Medications    Prior to Admission medications   Medication Sig Start Date End Date Taking? Authorizing Provider  acetaminophen (TYLENOL) 500 MG tablet Take 500-1,000 mg by mouth every 6 (six) hours as needed for mild pain, moderate pain, fever or headache.    [provider]  amLODipine (NORVASC) 10 MG tablet Take 1 tablet (10 mg total) by mouth daily. 03/07/22   Plotnikov, Georgina Quint, MD  aspirin (ASPIRIN CHILDRENS) 81 MG chewable tablet Chew 1 tablet (81 mg total) by mouth daily. 12/06/14   Plotnikov, Georgina Quint, MD  benzonatate (TESSALON) 200 MG capsule Take 1 capsule (200 mg total) by mouth 2 (two) times daily as needed for cough. 08/03/22   Delorse Lek, FNP  Blood Glucose Monitoring Suppl (ONE TOUCH ULTRA 2) w/Device KIT Use to check blood sugars daily 02/05/22   Plotnikov, Georgina Quint, MD  brimonidine (ALPHAGAN) 0.2 % ophthalmic solution Place 1 drop into both eyes in the morning and at bedtime. 04/21/19   [provider]  carvedilol (COREG) 25 MG tablet TAKE 1 TABLET(25 MG) BY MOUTH TWICE DAILY WITH A MEAL 02/28/22   Plotnikov, Georgina Quint, MD  cholecalciferol (VITAMIN D) 1000 units tablet Take 1,000 Units by mouth daily.    [provider]  diclofenac Sodium (VOLTAREN) 1 % GEL Apply 2 g topically 4 (four) times daily.     [provider]  famotidine (PEPCID) 40 MG tablet TAKE 1 TABLET BY MOUTH EVERY DAY 03/21/22   Plotnikov, Georgina Quint, MD  fluticasone (FLONASE) 50 MCG/ACT nasal spray Place 2 sprays into both nostrils daily. 08/03/22   Delorse Lek, FNP  folic acid (FOLVITE) 1 MG tablet Take 1 tablet daily    [provider]  furosemide (LASIX) 20 MG tablet TAKE 1 TO 2 TABLETS BY MOUTH DAILY AS NEEDED FOR  SWELLING 05/28/22   Plotnikov, Georgina Quint, MD  glucose blood (ONETOUCH ULTRA) test strip TEST GLUCOSE LEVELS ONCE DAILY 02/05/22   Plotnikov, Georgina Quint, MD  Lancets (ONETOUCH DELICA PLUS LANCET30G) MISC USE TO TEST BLOOD SUGAR LEVELS 02/05/22   Plotnikov, Georgina Quint, MD  latanoprost (XALATAN) 0.005 % ophthalmic solution Place 1 drop into both eyes at bedtime. 07/08/17   [provider]  levothyroxine (SYNTHROID) 25 MCG tablet TAKE 1 TABLET BY MOUTH EVERY DAY BEFORE BREAKFAST 10/20/21   Plotnikov, Georgina Quint, MD  Lidocaine 4 % PTCH Apply 1 patch topically daily. 12 hours on and 12 hours off    [provider]  metFORMIN (GLUCOPHAGE) 500 MG tablet TAKE 1 TABLET BY MOUTH TWICE A DAY WITH A MEAL 05/09/22   Plotnikov, Georgina Quint, MD  methotrexate 2.5 MG tablet Take 20 mg by mouth once a week. 07/15/19   [provider]  Multiple Vitamins-Minerals (PRESERVISION AREDS 2+MULTI VIT PO) Take 1 capsule by mouth in the morning and at bedtime.    [provider]  Polyethyl Glycol-Propyl Glycol (SYSTANE OP) Apply 1 drop to eye as needed.    [provider]  Polyethylene Glycol 3350 (MIRALAX PO) Take by mouth.    [provider]  potassium chloride (KLOR-CON M) 10 MEQ tablet Take 1 tablet (10 mEq total) by mouth daily. 02/26/21   Plotnikov, Georgina Quint, MD  triamcinolone cream (KENALOG) 0.1 % Apply 1 Application topically 4 (four) times daily. 09/08/21   Plotnikov, Georgina Quint, MD  zolpidem (AMBIEN) 10 MG tablet Take 10 mg at at bedtime as needed. 02/21/22   Plotnikov, Georgina Quint,  MD    Family History Family History  Problem Relation Age of Onset   Lung cancer Brother    Colon cancer Brother 51       19's   Diabetes Father    Brain cancer Brother    Stroke Sister    Breast cancer Neg Hx     Social History Social History   Tobacco Use   Smoking status: Never   Smokeless tobacco: Never  Vaping Use   Vaping status: Never Used  Substance Use Topics   Alcohol use: No    Alcohol/week: 0.0 standard drinks of alcohol   Drug use: No     Allergies   Codeine, Hydrocodone, Hydrocodone-acetaminophen, Hydroxyzine, Lovastatin, Pantoprazole, Pepcid [famotidine], Telmisartan, Tramadol hcl, and Trazodone and nefazodone   Review of Systems Review of Systems  Constitutional:  Negative for chills and fever.  HENT:  Positive for congestion and sore throat. Negative for ear pain.   Eyes:  Negative for discharge and redness.  Respiratory:  Positive for cough. Negative for shortness of breath and wheezing.   Gastrointestinal:  Negative for abdominal pain, diarrhea, nausea and vomiting.  Musculoskeletal:  Positive for myalgias.     Physical Exam Triage Vital Signs ED Triage Vitals [08/05/22 1043]  Encounter Vitals Group     BP      Systolic BP Percentile      Diastolic BP Percentile      Pulse      Resp      Temp      Temp src      SpO2      Weight      Height      Head Circumference      Peak Flow      Pain Score 7     Pain Loc      Pain Education  Exclude from Growth Chart    No data found.  Updated Vital Signs BP 135/75 (BP Location: Left Arm)   Pulse 62   Temp 98.3 F (36.8 C) (Oral)   Resp 20   SpO2 97%      Physical Exam Vitals and nursing note reviewed.  Constitutional:      General: She is not in acute distress.    Appearance: Normal appearance. She is not ill-appearing.  HENT:     Head: Normocephalic and atraumatic.     Right Ear: Tympanic membrane normal.     Left Ear: Tympanic membrane normal.     Nose: Congestion  present.     Mouth/Throat:     Mouth: Mucous membranes are moist.     Pharynx: No oropharyngeal exudate or posterior oropharyngeal erythema.  Eyes:     Conjunctiva/sclera: Conjunctivae normal.  Cardiovascular:     Rate and Rhythm: Normal rate and regular rhythm.  Pulmonary:     Effort: Pulmonary effort is normal. No respiratory distress.     Breath sounds: Normal breath sounds. No wheezing, rhonchi or rales.  Skin:    General: Skin is warm and dry.  Neurological:     Mental Status: She is alert.  Psychiatric:        Mood and Affect: Mood normal.        Thought Content: Thought content normal.      UC Treatments / Results  Labs (all labs ordered are listed, but only abnormal results are displayed) Labs Reviewed  SARS CORONAVIRUS 2 (TAT 6-24 HRS)    EKG   Radiology No results found.  Procedures Procedures (including critical care time)  Medications Ordered in UC Medications - No data to display  Initial Impression / Assessment and Plan / UC Course  I have reviewed the triage vital signs and the nursing notes.  Pertinent labs & imaging results that were available during my care of the patient were reviewed by me and considered in my medical decision making (see chart for details).    Given known exposure will screen for Covid and encouraged follow up with any persistent or worsening symptoms. Recommend symptomatic treatment, and rest. Low suspicion for pneumonia given lack of fever, productive cough, clear lung sounds on exam. If no improvement discussed recommendation for CXR.   Final Clinical Impressions(s) / UC Diagnoses   Final diagnoses:  Encounter for screening for COVID-19   Discharge Instructions   None    ED Prescriptions   None    PDMP not reviewed this encounter.   Tomi Bamberger, PA-C 08/05/22 1158

## 2022-08-05 NOTE — ED Triage Notes (Addendum)
Patient has not tested positive.  Patient has tested negative.  Family member she lives with tested positive on Monday, 7/22 Patient throat was scratchy on Wednesday, 7/24.  Symptoms now include a dry cough, general aches , headache .   Patient is not coughing up any phlegm.

## 2022-08-06 ENCOUNTER — Telehealth: Payer: Self-pay | Admitting: Emergency Medicine

## 2022-08-06 NOTE — Telephone Encounter (Signed)
Reviewed COVID results with patient, all questions answered

## 2022-08-08 ENCOUNTER — Other Ambulatory Visit: Payer: Self-pay | Admitting: Internal Medicine

## 2022-08-08 ENCOUNTER — Encounter (INDEPENDENT_AMBULATORY_CARE_PROVIDER_SITE_OTHER): Payer: Self-pay

## 2022-08-13 ENCOUNTER — Ambulatory Visit: Payer: PPO | Admitting: Family Medicine

## 2022-08-13 ENCOUNTER — Encounter: Payer: Self-pay | Admitting: Family Medicine

## 2022-08-13 VITALS — BP 136/66 | HR 78 | Temp 98.3°F | Resp 20 | Ht 63.0 in | Wt 178.0 lb

## 2022-08-13 DIAGNOSIS — U071 COVID-19: Secondary | ICD-10-CM | POA: Diagnosis not present

## 2022-08-13 MED ORDER — ALBUTEROL SULFATE HFA 108 (90 BASE) MCG/ACT IN AERS: 2.0000 | INHALATION_SPRAY | Freq: Four times a day (QID) | RESPIRATORY_TRACT | 0 refills | Status: AC | PRN

## 2022-08-13 MED ORDER — PROMETHAZINE-DM 6.25-15 MG/5ML PO SYRP: 5.0000 mL | ORAL_SOLUTION | Freq: Four times a day (QID) | ORAL | 0 refills | Status: DC | PRN

## 2022-08-13 NOTE — Patient Instructions (Signed)
Vitamin C, Zinc, Elderberry for your immune system

## 2022-08-13 NOTE — Progress Notes (Signed)
Assessment & Plan:  1. COVID-19 Patient declined steroids.  Education provided on COVID-19.  Discussed appropriate technique and use of an albuterol inhaler. - albuterol (VENTOLIN HFA) 108 (90 Base) MCG/ACT inhaler; Inhale 2 puffs into the lungs every 6 (six) hours as needed for wheezing or shortness of breath.  Dispense: 18 g; Refill: 0 - promethazine-dextromethorphan (PROMETHAZINE-DM) 6.25-15 MG/5ML syrup; Take 5 mLs by mouth 4 (four) times daily as needed for cough.  Dispense: 118 mL; Refill: 0  No results found for any visits on 08/13/22.  Follow up plan: Return if symptoms worsen or fail to improve.  Deliah Boston, MSN, APRN, FNP-C  Subjective:  HPI: Deanna Salazar is a 82 y.o. female presenting on 08/13/2022 for Cough (Was exposed to Covid 2 weeks ago = become POS for Covid on 7/28 at urgent care. Still having trouble with breathing and still has a cough )  Patient complains of cough and tightness upon breathing . Onset of symptoms was 2 weeks ago, gradually improving since that time. She is drinking plenty of fluids. Evaluation to date: diagnosed with COVID on 08/05/22. Treatment to date:  [employer] which were not helpful . She does not smoke.     ROS: Negative unless specifically indicated above in HPI.   Relevant past medical history reviewed and updated as indicated.   Allergies and medications reviewed and updated.   Current Outpatient Medications:    acetaminophen (TYLENOL) 500 MG tablet, Take 500-1,000 mg by mouth every 6 (six) hours as needed for mild pain, moderate pain, fever or headache., Disp: , Rfl:    amLODipine (NORVASC) 10 MG tablet, Take 1 tablet (10 mg total) by mouth daily., Disp: 90 tablet, Rfl: 1   aspirin (ASPIRIN CHILDRENS) 81 MG chewable tablet, Chew 1 tablet (81 mg total) by mouth daily., Disp: 36 tablet, Rfl: 11   benzonatate (TESSALON) 200 MG capsule, Take 1 capsule (200 mg total) by mouth 2 (two) times daily as needed for cough., Disp: 20  capsule, Rfl: 0   Blood Glucose Monitoring Suppl (ONE TOUCH ULTRA 2) w/Device KIT, Use to check blood sugars daily, Disp: 1 kit, Rfl: 0   brimonidine (ALPHAGAN) 0.2 % ophthalmic solution, Place 1 drop into both eyes in the morning and at bedtime., Disp: , Rfl:    carvedilol (COREG) 25 MG tablet, TAKE 1 TABLET(25 MG) BY MOUTH TWICE DAILY WITH A MEAL, Disp: 180 tablet, Rfl: 1   cholecalciferol (VITAMIN D) 1000 units tablet, Take 1,000 Units by mouth daily., Disp: , Rfl:    famotidine (PEPCID) 40 MG tablet, TAKE 1 TABLET BY MOUTH EVERY DAY, Disp: 30 tablet, Rfl: 5   fluticasone (FLONASE) 50 MCG/ACT nasal spray, Place 2 sprays into both nostrils daily., Disp: 16 g, Rfl: 6   folic acid (FOLVITE) 1 MG tablet, Take 1 tablet daily, Disp: , Rfl:    furosemide (LASIX) 20 MG tablet, TAKE 1 TO 2 TABLETS BY MOUTH DAILY AS NEEDED FOR SWELLING, Disp: 60 tablet, Rfl: 11   glucose blood (ONETOUCH ULTRA) test strip, TEST GLUCOSE LEVELS ONCE DAILY, Disp: 100 strip, Rfl: 3   Lancets (ONETOUCH DELICA PLUS LANCET30G) MISC, USE TO TEST BLOOD SUGAR LEVELS, Disp: 100 each, Rfl: 2   latanoprost (XALATAN) 0.005 % ophthalmic solution, Place 1 drop into both eyes at bedtime., Disp: , Rfl: 3   levothyroxine (SYNTHROID) 25 MCG tablet, TAKE 1 TABLET BY MOUTH EVERY DAY BEFORE BREAKFAST, Disp: 90 tablet, Rfl: 2   metFORMIN (GLUCOPHAGE) 500 MG tablet, TAKE 1  TABLET BY MOUTH TWICE A DAY WITH FOOD, Disp: 180 tablet, Rfl: 1   methotrexate 2.5 MG tablet, Take 20 mg by mouth once a week., Disp: , Rfl:    Multiple Vitamins-Minerals (PRESERVISION AREDS 2+MULTI VIT PO), Take 1 capsule by mouth in the morning and at bedtime., Disp: , Rfl:    Polyethyl Glycol-Propyl Glycol (SYSTANE OP), Apply 1 drop to eye as needed., Disp: , Rfl:    Polyethylene Glycol 3350 (MIRALAX PO), Take by mouth., Disp: , Rfl:    triamcinolone cream (KENALOG) 0.1 %, Apply 1 Application topically 4 (four) times daily., Disp: 80 g, Rfl: 3   zolpidem (AMBIEN) 10 MG  tablet, Take 10 mg at at bedtime as needed., Disp: 30 tablet, Rfl: 5   Lidocaine 4 % PTCH, Apply 1 patch topically daily. 12 hours on and 12 hours off (Patient not taking: Reported on 08/13/2022), Disp: , Rfl:   Allergies  Allergen Reactions   Codeine Nausea And Vomiting and Other (See Comments)    Pt states that she is able to take on a short term basis.     Hydrocodone Nausea And Vomiting and Other (See Comments)    Pt states that she is able to take on a short term basis.     Hydrocodone-Acetaminophen     Other reaction(s): Unknown   Hydroxyzine     nightmares   Lovastatin Itching and Other (See Comments)    Reaction:  Leg cramps    Pantoprazole     ?abd pain   Pepcid [Famotidine]     ?abd pain   Telmisartan     The patient thinks that telmisartan is making her sneeze   Tramadol Hcl Itching   Trazodone And Nefazodone     Nightmares    Objective:   BP 136/66   Pulse 78   Temp 98.3 F (36.8 C)   Resp 20   Ht 5\' 3"  (1.6 m)   Wt 178 lb (80.7 kg)   BMI 31.53 kg/m    Physical Exam Vitals reviewed.  Constitutional:      General: She is not in acute distress.    Appearance: Normal appearance. She is not ill-appearing, toxic-appearing or diaphoretic.  HENT:     Head: Normocephalic and atraumatic.  Eyes:     General: No scleral icterus.       Right eye: No discharge.        Left eye: No discharge.     Conjunctiva/sclera: Conjunctivae normal.  Cardiovascular:     Rate and Rhythm: Normal rate and regular rhythm.     Heart sounds: Murmur heard.     No friction rub. No gallop.  Pulmonary:     Effort: Pulmonary effort is normal. No respiratory distress.     Breath sounds: Normal breath sounds. No stridor. No wheezing, rhonchi or rales.  Musculoskeletal:        General: Normal range of motion.     Cervical back: Normal range of motion.  Skin:    General: Skin is warm and dry.     Capillary Refill: Capillary refill takes less than 2 seconds.  Neurological:     General:  No focal deficit present.     Mental Status: She is alert and oriented to person, place, and time. Mental status is at baseline.  Psychiatric:        Mood and Affect: Mood normal.        Behavior: Behavior normal.        Thought Content:  Thought content normal.        Judgment: Judgment normal.

## 2022-08-14 ENCOUNTER — Ambulatory Visit: Payer: PPO | Admitting: Podiatry

## 2022-08-15 ENCOUNTER — Telehealth: Payer: Self-pay | Admitting: Internal Medicine

## 2022-08-15 NOTE — Telephone Encounter (Signed)
Prescription Request  08/15/2022  LOV: 05/24/2022  What is the name of the medication or equipment? zolpidem (AMBIEN) 10 MG tablet   Have you contacted your pharmacy to request a refill? No   Which pharmacy would you like this sent to?  [id number] [place], [place] - [address] AT [facility] [address] [place] [address] Phone: [phone] Fax: [phone]    Patient notified that their request is being sent to the clinical staff for review and that they should receive a response within 2 business days.   Please advise at Mobile [phone] (mobile)

## 2022-08-17 ENCOUNTER — Other Ambulatory Visit: Payer: Self-pay | Admitting: Internal Medicine

## 2022-08-19 MED ORDER — ZOLPIDEM TARTRATE 10 MG PO TABS: ORAL_TABLET | ORAL | 5 refills | Status: DC

## 2022-08-19 NOTE — Telephone Encounter (Signed)
Okay.  Thanks.

## 2022-08-21 ENCOUNTER — Other Ambulatory Visit: Payer: Self-pay | Admitting: Internal Medicine

## 2022-08-23 ENCOUNTER — Ambulatory Visit (INDEPENDENT_AMBULATORY_CARE_PROVIDER_SITE_OTHER): Payer: PPO

## 2022-08-23 ENCOUNTER — Other Ambulatory Visit: Payer: Self-pay | Admitting: Internal Medicine

## 2022-08-23 ENCOUNTER — Encounter (INDEPENDENT_AMBULATORY_CARE_PROVIDER_SITE_OTHER): Payer: Self-pay

## 2022-08-23 ENCOUNTER — Encounter: Payer: Self-pay | Admitting: Internal Medicine

## 2022-08-23 ENCOUNTER — Ambulatory Visit (INDEPENDENT_AMBULATORY_CARE_PROVIDER_SITE_OTHER): Payer: PPO | Admitting: Internal Medicine

## 2022-08-23 VITALS — BP 116/68 | HR 44 | Temp 98.6°F | Ht 63.0 in | Wt 178.0 lb

## 2022-08-23 DIAGNOSIS — M797 Fibromyalgia: Secondary | ICD-10-CM | POA: Diagnosis not present

## 2022-08-23 DIAGNOSIS — R051 Acute cough: Secondary | ICD-10-CM | POA: Diagnosis not present

## 2022-08-23 DIAGNOSIS — E119 Type 2 diabetes mellitus without complications: Secondary | ICD-10-CM

## 2022-08-23 DIAGNOSIS — E039 Hypothyroidism, unspecified: Secondary | ICD-10-CM | POA: Diagnosis not present

## 2022-08-23 DIAGNOSIS — J209 Acute bronchitis, unspecified: Secondary | ICD-10-CM | POA: Diagnosis not present

## 2022-08-23 DIAGNOSIS — N183 Chronic kidney disease, stage 3 unspecified: Secondary | ICD-10-CM

## 2022-08-23 DIAGNOSIS — U071 COVID-19: Secondary | ICD-10-CM

## 2022-08-23 DIAGNOSIS — R5382 Chronic fatigue, unspecified: Secondary | ICD-10-CM | POA: Diagnosis not present

## 2022-08-23 DIAGNOSIS — Z8616 Personal history of COVID-19: Secondary | ICD-10-CM | POA: Diagnosis not present

## 2022-08-23 DIAGNOSIS — I700 Atherosclerosis of aorta: Secondary | ICD-10-CM | POA: Diagnosis not present

## 2022-08-23 DIAGNOSIS — R059 Cough, unspecified: Secondary | ICD-10-CM | POA: Diagnosis not present

## 2022-08-23 LAB — CBC WITH DIFFERENTIAL/PLATELET
Basophils Absolute: 0 10*3/uL (ref 0.0–0.1)
Basophils Relative: 1 % (ref 0.0–3.0)
Eosinophils Absolute: 0.3 10*3/uL (ref 0.0–0.7)
Eosinophils Relative: 7.2 % — ABNORMAL HIGH (ref 0.0–5.0)
HCT: 36.6 % (ref 36.0–46.0)
Hemoglobin: 11.7 g/dL — ABNORMAL LOW (ref 12.0–15.0)
Lymphocytes Relative: 30.8 % (ref 12.0–46.0)
Lymphs Abs: 1.4 10*3/uL (ref 0.7–4.0)
MCHC: 31.9 g/dL (ref 30.0–36.0)
MCV: 94.5 fl (ref 78.0–100.0)
Monocytes Absolute: 0.5 10*3/uL (ref 0.1–1.0)
Monocytes Relative: 11.8 % (ref 3.0–12.0)
Neutro Abs: 2.2 10*3/uL (ref 1.4–7.7)
Neutrophils Relative %: 49.2 % (ref 43.0–77.0)
Platelets: 236 10*3/uL (ref 150.0–400.0)
RBC: 3.87 Mil/uL (ref 3.87–5.11)
RDW: 14.9 % (ref 11.5–15.5)
WBC: 4.4 10*3/uL (ref 4.0–10.5)

## 2022-08-23 LAB — COMPREHENSIVE METABOLIC PANEL
ALT: 11 U/L (ref 0–35)
AST: 20 U/L (ref 0–37)
Albumin: 4.2 g/dL (ref 3.5–5.2)
Alkaline Phosphatase: 72 U/L (ref 39–117)
BUN: 14 mg/dL (ref 6–23)
CO2: 32 mEq/L (ref 19–32)
Calcium: 10.6 mg/dL — ABNORMAL HIGH (ref 8.4–10.5)
Chloride: 101 mEq/L (ref 96–112)
Creatinine, Ser: 0.95 mg/dL (ref 0.40–1.20)
GFR: 55.61 mL/min — ABNORMAL LOW (ref >60.00)
Glucose, Bld: 94 mg/dL (ref 70–99)
Potassium: 3.5 mEq/L (ref 3.5–5.1)
Sodium: 140 mEq/L (ref 135–145)
Total Bilirubin: 0.5 mg/dL (ref 0.2–1.2)
Total Protein: 7.1 g/dL (ref 6.0–8.3)

## 2022-08-23 LAB — URINALYSIS
Bilirubin Urine: NEGATIVE
Hgb urine dipstick: NEGATIVE
Ketones, ur: NEGATIVE
Leukocytes,Ua: NEGATIVE
Nitrite: NEGATIVE
Specific Gravity, Urine: 1.01 (ref 1.000–1.030)
Total Protein, Urine: NEGATIVE
Urine Glucose: NEGATIVE
Urobilinogen, UA: 0.2 (ref 0.0–1.0)
pH: 7 (ref 5.0–8.0)

## 2022-08-23 LAB — HEMOGLOBIN A1C: Hgb A1c MFr Bld: 5.9 % (ref 4.6–6.5)

## 2022-08-23 LAB — TSH: TSH: 2.46 u[IU]/mL (ref 0.35–5.50)

## 2022-08-23 MED ORDER — PROMETHAZINE-DM 6.25-15 MG/5ML PO SYRP: 5.0000 mL | ORAL_SOLUTION | Freq: Four times a day (QID) | ORAL | 0 refills | Status: DC | PRN

## 2022-08-23 MED ORDER — CYCLOBENZAPRINE HCL 10 MG PO TABS: 10.0000 mg | ORAL_TABLET | Freq: Every evening | ORAL | 3 refills | Status: DC | PRN

## 2022-08-23 MED ORDER — METHYLPREDNISOLONE 4 MG PO TBPK: ORAL_TABLET | ORAL | 0 refills | Status: AC

## 2022-08-23 MED ORDER — AZITHROMYCIN 250 MG PO TABS: ORAL_TABLET | ORAL | 0 refills | Status: DC

## 2022-08-23 NOTE — Assessment & Plan Note (Signed)
Chronic On Levothroid Check TSH

## 2022-08-23 NOTE — Assessment & Plan Note (Signed)
Post-COVID Start Z pac Prom-cod syr Get a CXR

## 2022-08-23 NOTE — Assessment & Plan Note (Signed)
Start Flexeril at hs

## 2022-08-23 NOTE — Assessment & Plan Note (Signed)
Monitoring GFR 

## 2022-08-23 NOTE — Progress Notes (Signed)
Subjective:  Patient ID: Deanna Salazar, female    DOB: 1940-01-30  Age: 82 y.o. MRN: [id number]  CC: Follow-up (3 mnth f/u, pt had COVID x3 weeks ago and states persistent cough has been lingering )   HPI Deanna Salazar presents for COVID x3 weeks ago and persistent cough has been lingering, can't sleep No fever; chills F/u hypothyroidism, FMS   Outpatient Medications Prior to Visit  Medication Sig Dispense Refill   acetaminophen (TYLENOL) 500 MG tablet Take 500-1,000 mg by mouth every 6 (six) hours as needed for mild pain, moderate pain, fever or headache.     albuterol (VENTOLIN HFA) 108 (90 Base) MCG/ACT inhaler Inhale 2 puffs into the lungs every 6 (six) hours as needed for wheezing or shortness of breath. 18 g 0   amLODipine (NORVASC) 10 MG tablet Take 1 tablet (10 mg total) by mouth daily. 90 tablet 1   aspirin (ASPIRIN CHILDRENS) 81 MG chewable tablet Chew 1 tablet (81 mg total) by mouth daily. 36 tablet 11   Blood Glucose Monitoring Suppl (ONE TOUCH ULTRA 2) w/Device KIT Use to check blood sugars daily 1 kit 0   brimonidine (ALPHAGAN) 0.2 % ophthalmic solution Place 1 drop into both eyes in the morning and at bedtime.     carvedilol (COREG) 25 MG tablet TAKE 1 TABLET(25 MG) BY MOUTH TWICE DAILY WITH A MEAL 180 tablet 1   cholecalciferol (VITAMIN D) 1000 units tablet Take 1,000 Units by mouth daily.     famotidine (PEPCID) 40 MG tablet TAKE 1 TABLET BY MOUTH EVERY DAY 30 tablet 5   fluticasone (FLONASE) 50 MCG/ACT nasal spray Place 2 sprays into both nostrils daily. 16 g 6   folic acid (FOLVITE) 1 MG tablet Take 1 tablet daily     furosemide (LASIX) 20 MG tablet TAKE 1 TO 2 TABLETS BY MOUTH DAILY AS NEEDED FOR SWELLING 60 tablet 11   glucose blood (ONETOUCH ULTRA) test strip TEST GLUCOSE LEVELS ONCE DAILY 100 strip 3   Lancets (ONETOUCH DELICA PLUS LANCET30G) MISC USE TO TEST BLOOD SUGAR LEVELS 100 each 2   latanoprost (XALATAN) 0.005 % ophthalmic solution Place 1 drop into  both eyes at bedtime.  3   levothyroxine (SYNTHROID) 25 MCG tablet TAKE 1 TABLET BY MOUTH EVERY DAY BEFORE BREAKFAST 90 tablet 2   metFORMIN (GLUCOPHAGE) 500 MG tablet TAKE 1 TABLET BY MOUTH TWICE A DAY WITH FOOD 180 tablet 1   methotrexate 2.5 MG tablet Take 20 mg by mouth once a week.     Multiple Vitamins-Minerals (PRESERVISION AREDS 2+MULTI VIT PO) Take 1 capsule by mouth in the morning and at bedtime.     Polyethyl Glycol-Propyl Glycol (SYSTANE OP) Apply 1 drop to eye as needed.     Polyethylene Glycol 3350 (MIRALAX PO) Take by mouth.     triamcinolone cream (KENALOG) 0.1 % Apply 1 Application topically 4 (four) times daily. 80 g 3   zolpidem (AMBIEN) 10 MG tablet Take 10 mg at at bedtime as needed. 30 tablet 5   promethazine-dextromethorphan (PROMETHAZINE-DM) 6.25-15 MG/5ML syrup Take 5 mLs by mouth 4 (four) times daily as needed for cough. 118 mL 0   Lidocaine 4 % PTCH Apply 1 patch topically daily. 12 hours on and 12 hours off (Patient not taking: Reported on 08/13/2022)     No facility-administered medications prior to visit.    ROS: Review of Systems  Constitutional:  Negative for activity change, appetite change, chills, fatigue and unexpected weight change.  HENT:  Negative for congestion, mouth sores and sinus pressure.   Eyes:  Negative for visual disturbance.  Respiratory:  Positive for cough and shortness of breath. Negative for chest tightness.   Gastrointestinal:  Negative for abdominal pain and nausea.  Genitourinary:  Negative for difficulty urinating, frequency and vaginal pain.  Musculoskeletal:  Negative for back pain and gait problem.  Skin:  Negative for pallor and rash.  Neurological:  Negative for dizziness, tremors, weakness, numbness and headaches.  Psychiatric/Behavioral:  Negative for confusion and sleep disturbance.     Objective:  BP 116/68 (BP Location: Right Arm, Patient Position: Sitting, Cuff Size: Large)   Pulse (!) 44   Temp 98.6 F (37 C) (Oral)    Ht 5\' 3"  (1.6 m)   Wt 178 lb (80.7 kg)   SpO2 96%   BMI 31.53 kg/m   BP Readings from Last 3 Encounters:  08/23/22 116/68  08/13/22 136/66  08/05/22 135/75    Wt Readings from Last 3 Encounters:  08/23/22 178 lb (80.7 kg)  08/13/22 178 lb (80.7 kg)  05/24/22 176 lb (79.8 kg)    Physical Exam Constitutional:      General: She is not in acute distress.    Appearance: She is well-developed. She is obese.  HENT:     Head: Normocephalic.     Right Ear: External ear normal.     Left Ear: External ear normal.     Nose: Nose normal.  Eyes:     General:        Right eye: No discharge.        Left eye: No discharge.     Conjunctiva/sclera: Conjunctivae normal.     Pupils: Pupils are equal, round, and reactive to light.  Neck:     Thyroid: No thyromegaly.     Vascular: No JVD.     Trachea: No tracheal deviation.  Cardiovascular:     Rate and Rhythm: Normal rate and regular rhythm.     Heart sounds: Normal heart sounds.  Pulmonary:     Effort: No respiratory distress.     Breath sounds: No stridor. No wheezing.  Abdominal:     General: Bowel sounds are normal. There is no distension.     Palpations: Abdomen is soft. There is no mass.     Tenderness: There is no abdominal tenderness. There is no guarding or rebound.  Musculoskeletal:        General: No tenderness.     Cervical back: Normal range of motion and neck supple. No rigidity.  Lymphadenopathy:     Cervical: No cervical adenopathy.  Skin:    Findings: No erythema or rash.  Neurological:     Cranial Nerves: No cranial nerve deficit.     Motor: No abnormal muscle tone.     Coordination: Coordination normal.     Deep Tendon Reflexes: Reflexes normal.  Psychiatric:        Behavior: Behavior normal.        Thought Content: Thought content normal.        Judgment: Judgment normal.   Looks tired  Lab Results  Component Value Date   WBC 4.8 05/24/2022   HGB 11.4 (L) 05/24/2022   HCT 35.3 (L) 05/24/2022    PLT 208.0 05/24/2022   GLUCOSE 97 05/24/2022   CHOL 187 08/17/2020   TRIG 132.0 08/17/2020   HDL 46.00 08/17/2020   LDLDIRECT 180.9 07/26/2009   LDLCALC 114 (H) 08/17/2020   ALT 10 05/24/2022  AST 19 05/24/2022   NA 140 05/24/2022   K 4.2 05/24/2022   CL 104 05/24/2022   CREATININE 0.97 05/24/2022   BUN 14 05/24/2022   CO2 31 05/24/2022   TSH 3.03 10/30/2021   HGBA1C 5.7 05/24/2022   MICROALBUR <0.7 05/24/2022    No results found.  Assessment & Plan:   Problem List Items Addressed This Visit     Hypothyroidism    Chronic On Levothroid Check TSH      Relevant Orders   CBC with Differential/Platelet   Comprehensive metabolic panel   Hemoglobin A1c   TSH   Acute bronchitis - Primary    Post-COVID Start Z pac Prom-cod syr Get a CXR      Fibromyalgia    Start Flexeril at hs      Relevant Medications   cyclobenzaprine (FLEXERIL) 10 MG tablet   methylPREDNISolone (MEDROL DOSEPAK) 4 MG TBPK tablet   Other Relevant Orders   Urinalysis   Chronic fatigue   Relevant Orders   Urinalysis   Diabetes type 2, controlled (HCC)   Relevant Orders   Hemoglobin A1c   Cough    Post-COVID Start Z pac Prom-cod syr Get a CXR      CRF (chronic renal failure), stage 3 (moderate) (HCC)    Monitoring GFR      Relevant Orders   CBC with Differential/Platelet   Comprehensive metabolic panel   Hemoglobin A1c   TSH   Other Visit Diagnoses     COVID-19       Relevant Medications   promethazine-dextromethorphan (PROMETHAZINE-DM) 6.25-15 MG/5ML syrup   azithromycin (ZITHROMAX Z-PAK) 250 MG tablet   Other Relevant Orders   DG Chest 2 View         Meds ordered this encounter  Medications   promethazine-dextromethorphan (PROMETHAZINE-DM) 6.25-15 MG/5ML syrup    Sig: Take 5-10 mLs by mouth 4 (four) times daily as needed for cough.    Dispense:  240 mL    Refill:  0   azithromycin (ZITHROMAX Z-PAK) 250 MG tablet    Sig: As directed    Dispense:  6 tablet     Refill:  0   cyclobenzaprine (FLEXERIL) 10 MG tablet    Sig: Take 1 tablet (10 mg total) by mouth at bedtime as needed for muscle spasms.    Dispense:  30 tablet    Refill:  3   methylPREDNISolone (MEDROL DOSEPAK) 4 MG TBPK tablet    Sig: As directed    Dispense:  21 tablet    Refill:  0      Follow-up: Return in about 4 weeks (around 09/20/2022) for a follow-up visit.  Sonda Primes, MD

## 2022-08-28 DIAGNOSIS — E669 Obesity, unspecified: Secondary | ICD-10-CM | POA: Diagnosis not present

## 2022-08-28 DIAGNOSIS — Z6831 Body mass index (BMI) 31.0-31.9, adult: Secondary | ICD-10-CM | POA: Diagnosis not present

## 2022-08-28 DIAGNOSIS — M1711 Unilateral primary osteoarthritis, right knee: Secondary | ICD-10-CM | POA: Diagnosis not present

## 2022-08-28 DIAGNOSIS — M25561 Pain in right knee: Secondary | ICD-10-CM | POA: Diagnosis not present

## 2022-08-28 DIAGNOSIS — M0609 Rheumatoid arthritis without rheumatoid factor, multiple sites: Secondary | ICD-10-CM | POA: Diagnosis not present

## 2022-08-30 ENCOUNTER — Other Ambulatory Visit: Payer: Self-pay | Admitting: Internal Medicine

## 2022-09-03 ENCOUNTER — Ambulatory Visit: Payer: PPO | Admitting: Internal Medicine

## 2022-09-11 ENCOUNTER — Other Ambulatory Visit: Payer: Self-pay

## 2022-09-11 ENCOUNTER — Ambulatory Visit
Admission: EM | Admit: 2022-09-11 | Discharge: 2022-09-11 | Disposition: A | Discharge status: Home / Self Care | Payer: PPO | Attending: Nurse Practitioner | Admitting: Nurse Practitioner

## 2022-09-11 ENCOUNTER — Encounter: Payer: Self-pay | Admitting: Emergency Medicine

## 2022-09-11 DIAGNOSIS — M5442 Lumbago with sciatica, left side: Secondary | ICD-10-CM | POA: Diagnosis not present

## 2022-09-11 MED ORDER — LIDOCAINE 4 % EX PTCH: 1.0000 | MEDICATED_PATCH | CUTANEOUS | 0 refills | Status: AC

## 2022-09-11 MED ORDER — TIZANIDINE HCL 4 MG PO TABS
4.0000 mg | ORAL_TABLET | Freq: Four times a day (QID) | ORAL | 0 refills | Status: DC | PRN
Start: 2022-09-11 — End: 2023-03-20

## 2022-09-11 NOTE — ED Provider Notes (Signed)
[name]    CSN: [id number] Arrival date & time: 09/11/22  0905      History   Chief Complaint Chief Complaint  Patient presents with   Back Pain    HPI Deanna Salazar is a 82 y.o. female.   Patient presents for back pain that started gradually a few days ago.  Denies recent trauma, fall, accident, or injury involving the low back or legs.  Reports the pain is mild and describes the pain as sharp and shooting.  Reports the pain does radiate to the L leg above the knee.  Has used heat for the pain with mild relief.  Denies lower extremity weakness, decreased sensation of lower extremities, numbness or tingling, saddle anesthesia, bowel/bladder incontinence, fevers, nausea/vomiting, and dysuria/urinary frequency.   Reports the pain occasionally wraps around her abdomen and down her left leg.  Reports history of sciatic pain.    Medical history significant for type 2 diabetes, fibromyalgia, hyperlipidemia, hypertension, glaucoma.    Past Medical History:  Diagnosis Date   Abdominal pain, generalized 02/05/2008   ABDOMINAL PAIN-EPIGASTRIC 03/24/2009   Acute bronchitis 02/15/2007   Adenomatous colon polyp 04/2000   Arthritis    ASCUS favor benign 10/2011   negative high risk HPV screen   BACK PAIN 07/20/2008   Breast cancer (HCC) 2009   l, hx of XRT Dr Truett Perna, Left side   BREAST CANCER, HX OF 04/16/2007   CHEST PAIN 02/23/2010   COLONIC POLYPS, ADENOMATOUS, HX OF 10/01/2007   CONSTIPATION, CHRONIC 05/10/2007   Dysuria 04/06/2008   FATIGUE 12/26/2006   Fatty liver    FATTY LIVER DISEASE 11/19/2006   FIBROMYALGIA 11/19/2006   GERD 11/19/2006   Dr Russella Dar   Glaucoma 07/2017   left eye   HEMATOCHEZIA 05/10/2007   HEMORRHOIDS, INTERNAL 05/10/2007   HIP PAIN 07/20/2008   HYPERLIPIDEMIA 07/26/2009   HYPERTENSION 12/26/2006   HYPOKALEMIA 11/25/2007   HYPOTHYROIDISM 12/26/2006   IBS 11/19/2006   INSOMNIA, PERSISTENT 12/26/2006   KELOID 07/25/2007   MURMUR 07/25/2007    Nausea alone 03/23/2010   PARESTHESIA 04/06/2008   Personal history of radiation therapy    RENAL CYST, LEFT 02/23/2010   Rheumatoid arthritis(714.0) 11/19/2006   Dr Mardene Sayer   RUQ PAIN 11/07/2009   Tubular adenoma    VISION IMPAIRMENT, LOW VISION, ONE EYE-LEFT 07/25/2007    Patient Active Problem List   Diagnosis Date Noted   Adult-onset obesity 03/05/2022   Foot pain, left 10/30/2021   Tongue swelling 09/28/2021   Lip swelling 09/28/2021   Angioedema 09/28/2021   Rash in adult 09/08/2021   Headache 07/27/2021   Weight loss 02/23/2021   CRF (chronic renal failure), stage 3 (moderate) (HCC) 02/25/2020   Nightmares 04/22/2019   Exposure to COVID-19 virus 10/09/2018   Cough 10/09/2018   Grief 11/23/2016   Unilateral primary osteoarthritis, right knee 08/23/2016   Hypokalemia 05/16/2016   Rhonchi 02/09/2016   Intertrigo 10/04/2015   Allergic rhinitis 04/05/2015   Well adult exam 06/28/2014   Tinea pedis 10/13/2013   Near syncope 09/28/2013   LBP (low back pain) 09/24/2012   Diabetes type 2, controlled (HCC) 11/06/2011   Breast cancer (HCC) 11/06/2011   URI, acute 03/02/2011   Flank pain 03/02/2011   Hyperglycemia 03/02/2011   Edema 10/30/2010   Dyspnea and respiratory abnormalities 10/30/2010   Cramp in limb 10/30/2010   Bronchitis 10/06/2010   NAUSEA ALONE 03/23/2010   RENAL CYST, LEFT 02/23/2010   Chest pain 02/23/2010   Dyslipidemia 07/26/2009  ABDOMINAL PAIN-EPIGASTRIC 03/24/2009   HIP PAIN 07/20/2008   BACK PAIN 07/20/2008   FEVER, NOS 07/20/2008   PARESTHESIA 04/06/2008   Dysuria 04/06/2008   Dysphagia, pharyngoesophageal phase 02/05/2008   ABDOMINAL PAIN, GENERALIZED 02/05/2008   HYPOKALEMIA 11/25/2007   Depression 11/25/2007   COLONIC POLYPS, ADENOMATOUS, HX OF 10/01/2007   VISION IMPAIRMENT, LOW VISION, ONE EYE-LEFT 07/25/2007   KELOID 07/25/2007   MURMUR 07/25/2007   HEMORRHOIDS, INTERNAL 05/10/2007   CONSTIPATION, CHRONIC 05/10/2007   BREAST  CANCER, HX OF 04/16/2007   Acute bronchitis 02/15/2007   Hypothyroidism 12/26/2006   Insomnia 12/26/2006   Essential hypertension 12/26/2006   Chronic fatigue 12/26/2006   GERD 11/19/2006   IBS 11/19/2006   FATTY LIVER DISEASE 11/19/2006   Rheumatoid arthritis (HCC) 11/19/2006   Fibromyalgia 11/19/2006    Past Surgical History:  Procedure Laterality Date   BREAST LUMPECTOMY Left 2009   BREAST SURGERY  2009   Left lumpectomy   COLONOSCOPY  06/12/2012   Cyst excision from chest     knee surgery Left    Left thyroidectomy     OS Cataract extraction  2009   OVARIAN CYST REMOVAL  age 19   SHOULDER SURGERY     both shoulders   TOE SURGERY     TUBAL LIGATION      OB History     Gravida  5   Para  5   Term      Preterm      AB      Living  5      SAB      IAB      Ectopic      Multiple      Live Births               Home Medications    Prior to Admission medications   Medication Sig Start Date End Date Taking? Authorizing Provider  tiZANidine (ZANAFLEX) 4 MG tablet Take 1 tablet (4 mg total) by mouth every 6 (six) hours as needed for muscle spasms. 09/11/22  Yes Valentino Nose, NP  acetaminophen (TYLENOL) 500 MG tablet Take 500-1,000 mg by mouth every 6 (six) hours as needed for mild pain, moderate pain, fever or headache.    [provider]  albuterol (VENTOLIN HFA) 108 (90 Base) MCG/ACT inhaler Inhale 2 puffs into the lungs every 6 (six) hours as needed for wheezing or shortness of breath. 08/13/22   Gwenlyn Fudge, FNP  amLODipine (NORVASC) 10 MG tablet TAKE 1 TABLET BY MOUTH EVERY DAY 08/30/22   Plotnikov, Georgina Quint, MD  aspirin (ASPIRIN CHILDRENS) 81 MG chewable tablet Chew 1 tablet (81 mg total) by mouth daily. 12/06/14   Plotnikov, Georgina Quint, MD  Blood Glucose Monitoring Suppl (ONE TOUCH ULTRA 2) w/Device KIT Use to check blood sugars daily 02/05/22   Plotnikov, Georgina Quint, MD  brimonidine (ALPHAGAN) 0.2 % ophthalmic solution Place 1  drop into both eyes in the morning and at bedtime. 04/21/19   [provider]  carvedilol (COREG) 25 MG tablet TAKE 1 TABLET(25 MG) BY MOUTH TWICE DAILY WITH A MEAL 08/21/22   Plotnikov, Georgina Quint, MD  cholecalciferol (VITAMIN D) 1000 units tablet Take 1,000 Units by mouth daily.    [provider]  famotidine (PEPCID) 40 MG tablet TAKE 1 TABLET BY MOUTH EVERY DAY 03/21/22   Plotnikov, Georgina Quint, MD  fluticasone (FLONASE) 50 MCG/ACT nasal spray Place 2 sprays into both nostrils daily. 08/03/22   Georgana Curio  W, FNP  folic acid (FOLVITE) 1 MG tablet Take 1 tablet daily    [provider]  furosemide (LASIX) 20 MG tablet TAKE 1 TO 2 TABLETS BY MOUTH DAILY AS NEEDED FOR SWELLING 05/28/22   Plotnikov, Georgina Quint, MD  glucose blood (ONETOUCH ULTRA) test strip TEST GLUCOSE LEVELS ONCE DAILY 02/05/22   Plotnikov, Georgina Quint, MD  Lancets (ONETOUCH DELICA PLUS LANCET30G) MISC USE TO TEST BLOOD SUGAR LEVELS 02/05/22   Plotnikov, Georgina Quint, MD  latanoprost (XALATAN) 0.005 % ophthalmic solution Place 1 drop into both eyes at bedtime. 07/08/17   [provider]  levothyroxine (SYNTHROID) 25 MCG tablet TAKE 1 TABLET BY MOUTH EVERY DAY BEFORE BREAKFAST 08/24/22   Plotnikov, Georgina Quint, MD  lidocaine 4 % Place 1 patch onto the skin daily. 12 hours on and 12 hours off 09/11/22   Cathlean Marseilles A, NP  metFORMIN (GLUCOPHAGE) 500 MG tablet TAKE 1 TABLET BY MOUTH TWICE A DAY WITH FOOD 08/08/22   Plotnikov, Georgina Quint, MD  methotrexate 2.5 MG tablet Take 20 mg by mouth once a week. 07/15/19   [provider]  methylPREDNISolone (MEDROL DOSEPAK) 4 MG TBPK tablet As directed 08/23/22   Plotnikov, Georgina Quint, MD  Multiple Vitamins-Minerals (PRESERVISION AREDS 2+MULTI VIT PO) Take 1 capsule by mouth in the morning and at bedtime.    [provider]  Polyethyl Glycol-Propyl Glycol (SYSTANE OP) Apply 1 drop to eye as needed.    [provider]  Polyethylene Glycol 3350 (MIRALAX PO)  Take by mouth.    [provider]  promethazine-dextromethorphan (PROMETHAZINE-DM) 6.25-15 MG/5ML syrup Take 5-10 mLs by mouth 4 (four) times daily as needed for cough. 08/23/22   Plotnikov, Georgina Quint, MD  triamcinolone cream (KENALOG) 0.1 % Apply 1 Application topically 4 (four) times daily. 09/08/21   Plotnikov, Georgina Quint, MD  zolpidem (AMBIEN) 10 MG tablet Take 10 mg at at bedtime as needed. 08/19/22   Plotnikov, Georgina Quint, MD    Family History Family History  Problem Relation Age of Onset   Lung cancer Brother    Colon cancer Brother 33       73's   Diabetes Father    Brain cancer Brother    Stroke Sister    Breast cancer Neg Hx     Social History Social History   Tobacco Use   Smoking status: Never   Smokeless tobacco: Never  Vaping Use   Vaping status: Never Used  Substance Use Topics   Alcohol use: No    Alcohol/week: 0.0 standard drinks of alcohol   Drug use: No     Allergies   Codeine, Hydrocodone, Hydrocodone-acetaminophen, Hydroxyzine, Lovastatin, Pantoprazole, Pepcid [famotidine], Telmisartan, Tramadol hcl, and Trazodone and nefazodone   Review of Systems Review of Systems Per HPI  Physical Exam Triage Vital Signs ED Triage Vitals  Encounter Vitals Group     BP 09/11/22 1029 (!) 167/78     Systolic BP Percentile --      Diastolic BP Percentile --      Pulse Rate 09/11/22 1029 62     Resp 09/11/22 1029 20     Temp 09/11/22 1029 98.3 F (36.8 C)     Temp Source 09/11/22 1029 Oral     SpO2 09/11/22 1029 97 %     Weight --      Height --      Head Circumference --      Peak Flow --      Pain Score  09/11/22 1032 4     Pain Loc --      Pain Education --      Exclude from Growth Chart --    No data found.  Updated Vital Signs BP (!) 167/78 (BP Location: Right Arm)   Pulse 62   Temp 98.3 F (36.8 C) (Oral)   Resp 20   SpO2 97%   Visual Acuity Right Eye Distance:   Left Eye Distance:   Bilateral Distance:    Right Eye Near:   Left  Eye Near:    Bilateral Near:     Physical Exam Vitals and nursing note reviewed.  Constitutional:      General: She is not in acute distress.    Appearance: Normal appearance. She is not toxic-appearing.  HENT:     Mouth/Throat:     Mouth: Mucous membranes are moist.     Pharynx: Oropharynx is clear.  Pulmonary:     Effort: Pulmonary effort is normal. No respiratory distress.  Musculoskeletal:       Back:     Right lower leg: No edema.     Left lower leg: No edema.     Comments: Inspection: no swelling, bruising, obvious deformity or redness to low midline back Palpation: tender to palpation to paraspinal muslces diffusely; no obvious deformities palpated ROM: Full ROM to bilateral lower extremities, lumbar spine Strength: 5/5 bilateral lower extremities Neurovascular: neurovascularly intact in bilateral lower extremities   Skin:    General: Skin is warm and dry.     Capillary Refill: Capillary refill takes less than 2 seconds.     Coloration: Skin is not jaundiced or pale.     Findings: No erythema.  Neurological:     Mental Status: She is alert and oriented to person, place, and time.  Psychiatric:        Behavior: Behavior is cooperative.      UC Treatments / Results  Labs (all labs ordered are listed, but only abnormal results are displayed) Labs Reviewed - No data to display  EKG   Radiology No results found.  Procedures Procedures (including critical care time)  Medications Ordered in UC Medications - No data to display  Initial Impression / Assessment and Plan / UC Course  I have reviewed the triage vital signs and the nursing notes.  Pertinent labs & imaging results that were available during my care of the patient were reviewed by me and considered in my medical decision making (see chart for details).   Patient is well-appearing, afebrile, not tachycardic, not tachypneic, oxygenating well on room air.  Patient is mildly hypertensive in urgent care  today.  1. Acute midline low back pain with left-sided sciatica No red flags in history or on exam today Vital signs are stable Examination reassuring Unable to treat with NSAIDs or corticosteroids secondary to risk factors including age, CAD risk, glaucoma, diabetes Treat with Tylenol 500 mg every 6 hours for pain, start tizanidine, sciatic rehab exercises Strict ER and return precautions discussed with patient Work excuse given  The patient was given the opportunity to ask questions.  All questions answered to their satisfaction.  The patient is in agreement to this plan.    Final Clinical Impressions(s) / UC Diagnoses   Final diagnoses:  Acute left-sided low back pain with left-sided sciatica     Discharge Instructions      Take Tylenol 500 mg every 6 hours for pain.  You can also take the tizanidine every 8 hours  as needed for muscular pain.  Start the sciatic rehab exercises at home.  Seek care if symptoms persist or worsen despite treatment.   ED Prescriptions     Medication Sig Dispense Auth. Provider   lidocaine 4 % Place 1 patch onto the skin daily. 12 hours on and 12 hours off 15 patch Cathlean Marseilles A, NP   tiZANidine (ZANAFLEX) 4 MG tablet Take 1 tablet (4 mg total) by mouth every 6 (six) hours as needed for muscle spasms. 30 tablet Valentino Nose, NP      PDMP not reviewed this encounter.   Valentino Nose, NP 09/11/22 1110

## 2022-09-11 NOTE — Discharge Instructions (Signed)
Take Tylenol 500 mg every 6 hours for pain.  You can also take the tizanidine every 8 hours as needed for muscular pain.  Start the sciatic rehab exercises at home.  Seek care if symptoms persist or worsen despite treatment.

## 2022-09-11 NOTE — ED Notes (Signed)
Pt called 1x on phone, went to vm

## 2022-09-11 NOTE — ED Notes (Signed)
Pt was in car, this rad tech called the wrong number

## 2022-09-11 NOTE — ED Triage Notes (Signed)
Pt here for lower back pain with some radiation down buttocks area x 4 days; pt sts hx of same with sciatica; denies new injury

## 2022-09-20 ENCOUNTER — Ambulatory Visit (INDEPENDENT_AMBULATORY_CARE_PROVIDER_SITE_OTHER): Payer: PPO

## 2022-09-20 ENCOUNTER — Encounter: Payer: Self-pay | Admitting: Internal Medicine

## 2022-09-20 ENCOUNTER — Ambulatory Visit (INDEPENDENT_AMBULATORY_CARE_PROVIDER_SITE_OTHER): Payer: PPO | Admitting: Internal Medicine

## 2022-09-20 VITALS — BP 122/72 | HR 62 | Temp 98.2°F | Ht 63.0 in | Wt 176.4 lb

## 2022-09-20 DIAGNOSIS — K5909 Other constipation: Secondary | ICD-10-CM

## 2022-09-20 DIAGNOSIS — E119 Type 2 diabetes mellitus without complications: Secondary | ICD-10-CM | POA: Diagnosis not present

## 2022-09-20 DIAGNOSIS — E039 Hypothyroidism, unspecified: Secondary | ICD-10-CM

## 2022-09-20 DIAGNOSIS — I10 Essential (primary) hypertension: Secondary | ICD-10-CM | POA: Diagnosis not present

## 2022-09-20 DIAGNOSIS — M5126 Other intervertebral disc displacement, lumbar region: Secondary | ICD-10-CM | POA: Diagnosis not present

## 2022-09-20 DIAGNOSIS — M5136 Other intervertebral disc degeneration, lumbar region: Secondary | ICD-10-CM | POA: Diagnosis not present

## 2022-09-20 DIAGNOSIS — M545 Low back pain, unspecified: Secondary | ICD-10-CM

## 2022-09-20 DIAGNOSIS — M47816 Spondylosis without myelopathy or radiculopathy, lumbar region: Secondary | ICD-10-CM | POA: Diagnosis not present

## 2022-09-20 DIAGNOSIS — M47814 Spondylosis without myelopathy or radiculopathy, thoracic region: Secondary | ICD-10-CM | POA: Diagnosis not present

## 2022-09-20 MED ORDER — LUBIPROSTONE 24 MCG PO CAPS: 24.0000 ug | ORAL_CAPSULE | Freq: Two times a day (BID) | ORAL | 3 refills | Status: DC

## 2022-09-20 NOTE — Assessment & Plan Note (Signed)
Lasix prn -  use 1/2 tab Pt is getting labs w/Rheumatology every 2 mo (CMET, CBC)

## 2022-09-20 NOTE — Assessment & Plan Note (Signed)
Miralax is not working well; tried Dulcolax, Senna  - abd cramps. Will try Amitiza

## 2022-09-20 NOTE — Assessment & Plan Note (Signed)
X ray Blue-Emu cream was recommended to use 2-3 times a day PT or chiropractic Rx suggested L>R

## 2022-09-20 NOTE — Assessment & Plan Note (Signed)
Obtain labs.

## 2022-09-20 NOTE — Progress Notes (Signed)
Subjective:  Patient ID: Deanna Salazar, female    DOB: Jun 20, 1940  Age: 82 y.o. MRN: [id number]  CC: No chief complaint on file.   HPI [employer] presents for constipation - Miralax is not working well; tried Dulcolax, Senna  - abd cramps C/o LBP x 2-3 wks  Outpatient Medications Prior to Visit  Medication Sig Dispense Refill   acetaminophen (TYLENOL) 500 MG tablet Take 500-1,000 mg by mouth every 6 (six) hours as needed for mild pain, moderate pain, fever or headache.     albuterol (VENTOLIN HFA) 108 (90 Base) MCG/ACT inhaler Inhale 2 puffs into the lungs every 6 (six) hours as needed for wheezing or shortness of breath. 18 g 0   amLODipine (NORVASC) 10 MG tablet TAKE 1 TABLET BY MOUTH EVERY DAY 90 tablet 1   aspirin (ASPIRIN CHILDRENS) 81 MG chewable tablet Chew 1 tablet (81 mg total) by mouth daily. 36 tablet 11   Blood Glucose Monitoring Suppl (ONE TOUCH ULTRA 2) w/Device KIT Use to check blood sugars daily 1 kit 0   brimonidine (ALPHAGAN) 0.2 % ophthalmic solution Place 1 drop into both eyes in the morning and at bedtime.     carvedilol (COREG) 25 MG tablet TAKE 1 TABLET(25 MG) BY MOUTH TWICE DAILY WITH A MEAL 180 tablet 1   cholecalciferol (VITAMIN D) 1000 units tablet Take 1,000 Units by mouth daily.     famotidine (PEPCID) 40 MG tablet TAKE 1 TABLET BY MOUTH EVERY DAY 30 tablet 5   fluticasone (FLONASE) 50 MCG/ACT nasal spray Place 2 sprays into both nostrils daily. 16 g 6   folic acid (FOLVITE) 1 MG tablet Take 1 tablet daily     furosemide (LASIX) 20 MG tablet TAKE 1 TO 2 TABLETS BY MOUTH DAILY AS NEEDED FOR SWELLING 60 tablet 11   glucose blood (ONETOUCH ULTRA) test strip TEST GLUCOSE LEVELS ONCE DAILY 100 strip 3   Lancets (ONETOUCH DELICA PLUS LANCET30G) MISC USE TO TEST BLOOD SUGAR LEVELS 100 each 2   latanoprost (XALATAN) 0.005 % ophthalmic solution Place 1 drop into both eyes at bedtime.  3   levothyroxine (SYNTHROID) 25 MCG tablet TAKE 1 TABLET BY MOUTH EVERY DAY  BEFORE BREAKFAST 90 tablet 1   lidocaine 4 % Place 1 patch onto the skin daily. 12 hours on and 12 hours off 15 patch 0   metFORMIN (GLUCOPHAGE) 500 MG tablet TAKE 1 TABLET BY MOUTH TWICE A DAY WITH FOOD 180 tablet 1   methotrexate 2.5 MG tablet Take 20 mg by mouth once a week.     methylPREDNISolone (MEDROL DOSEPAK) 4 MG TBPK tablet As directed 21 tablet 0   Multiple Vitamins-Minerals (PRESERVISION AREDS 2+MULTI VIT PO) Take 1 capsule by mouth in the morning and at bedtime.     Polyethyl Glycol-Propyl Glycol (SYSTANE OP) Apply 1 drop to eye as needed.     Polyethylene Glycol 3350 (MIRALAX PO) Take by mouth.     promethazine-dextromethorphan (PROMETHAZINE-DM) 6.25-15 MG/5ML syrup Take 5-10 mLs by mouth 4 (four) times daily as needed for cough. 240 mL 0   triamcinolone cream (KENALOG) 0.1 % Apply 1 Application topically 4 (four) times daily. 80 g 3   zolpidem (AMBIEN) 10 MG tablet Take 10 mg at at bedtime as needed. 30 tablet 5   tiZANidine (ZANAFLEX) 4 MG tablet Take 1 tablet (4 mg total) by mouth every 6 (six) hours as needed for muscle spasms. (Patient not taking: Reported on 09/20/2022) 30 tablet 0  No facility-administered medications prior to visit.    ROS: Review of Systems  Constitutional:  Positive for fatigue. Negative for activity change, appetite change, chills and unexpected weight change.  HENT:  Negative for congestion, mouth sores and sinus pressure.   Eyes:  Negative for visual disturbance.  Respiratory:  Negative for cough and chest tightness.   Gastrointestinal:  Positive for constipation. Negative for abdominal pain and nausea.  Genitourinary:  Negative for difficulty urinating, frequency and vaginal pain.  Musculoskeletal:  Positive for arthralgias. Negative for back pain and gait problem.  Skin:  Negative for pallor and rash.  Neurological:  Negative for dizziness, tremors, weakness, numbness and headaches.  Psychiatric/Behavioral:  Negative for confusion and sleep  disturbance. The patient is not nervous/anxious.     Objective:  BP 122/72 (BP Location: Right Arm, Patient Position: Sitting, Cuff Size: Normal)   Pulse 62   Temp 98.2 F (36.8 C) (Oral)   Ht 5\' 3"  (1.6 m)   Wt 176 lb 6.4 oz (80 kg)   SpO2 99%   BMI 31.25 kg/m   BP Readings from Last 3 Encounters:  09/20/22 122/72  09/11/22 (!) 167/78  08/23/22 116/68    Wt Readings from Last 3 Encounters:  09/20/22 176 lb 6.4 oz (80 kg)  08/23/22 178 lb (80.7 kg)  08/13/22 178 lb (80.7 kg)    Physical Exam Constitutional:      General: She is not in acute distress.    Appearance: She is well-developed. She is obese.  HENT:     Head: Normocephalic.     Right Ear: External ear normal.     Left Ear: External ear normal.     Nose: Nose normal.  Eyes:     General:        Right eye: No discharge.        Left eye: No discharge.     Conjunctiva/sclera: Conjunctivae normal.     Pupils: Pupils are equal, round, and reactive to light.  Neck:     Thyroid: No thyromegaly.     Vascular: No JVD.     Trachea: No tracheal deviation.  Cardiovascular:     Rate and Rhythm: Normal rate and regular rhythm.     Heart sounds: Normal heart sounds.  Pulmonary:     Effort: No respiratory distress.     Breath sounds: No stridor. No wheezing.  Abdominal:     General: Bowel sounds are normal. There is no distension.     Palpations: Abdomen is soft. There is no mass.     Tenderness: There is no abdominal tenderness. There is no guarding or rebound.  Musculoskeletal:        General: No tenderness.     Cervical back: Normal range of motion and neck supple. No rigidity.     Right lower leg: No edema.     Left lower leg: No edema.  Lymphadenopathy:     Cervical: No cervical adenopathy.  Skin:    Findings: No erythema or rash.  Neurological:     Cranial Nerves: No cranial nerve deficit.     Motor: No abnormal muscle tone.     Coordination: Coordination normal.     Deep Tendon Reflexes: Reflexes  normal.  Psychiatric:        Behavior: Behavior normal.        Thought Content: Thought content normal.        Judgment: Judgment normal.   LS spine w/pain  Lab Results  Component Value Date  WBC 4.4 08/23/2022   HGB 11.7 (L) 08/23/2022   HCT 36.6 08/23/2022   PLT 236.0 08/23/2022   GLUCOSE 94 08/23/2022   CHOL 187 08/17/2020   TRIG 132.0 08/17/2020   HDL 46.00 08/17/2020   LDLDIRECT 180.9 07/26/2009   LDLCALC 114 (H) 08/17/2020   ALT 11 08/23/2022   AST 20 08/23/2022   NA 140 08/23/2022   K 3.5 08/23/2022   CL 101 08/23/2022   CREATININE 0.95 08/23/2022   BUN 14 08/23/2022   CO2 32 08/23/2022   TSH 2.46 08/23/2022   HGBA1C 5.9 08/23/2022   MICROALBUR <0.7 05/24/2022    No results found.  Assessment & Plan:   Problem List Items Addressed This Visit     Hypothyroidism    Chronic On Levothroid Check TSH      Essential hypertension     Lasix prn -  use 1/2 tab Pt is getting labs w/Rheumatology every 2 mo (CMET, CBC)      CONSTIPATION, CHRONIC - Primary    Miralax is not working well; tried Dulcolax, Senna  - abd cramps. Will try Amitiza       BACK PAIN    X ray Blue-Emu cream was recommended to use 2-3 times a day PT or chiropractic Rx suggested L>R      Relevant Orders   DG Lumbar Spine 2-3 Views   Diabetes type 2, controlled (HCC)    Obtain labs         Meds ordered this encounter  Medications   lubiprostone (AMITIZA) 24 MCG capsule    Sig: Take 1 capsule (24 mcg total) by mouth 2 (two) times daily with a meal.    Dispense:  60 capsule    Refill:  3      Follow-up: Return in about 3 months (around 12/20/2022) for a follow-up visit.  Sonda Primes, MD

## 2022-09-20 NOTE — Assessment & Plan Note (Signed)
Chronic On Levothroid Check TSH

## 2022-09-24 ENCOUNTER — Ambulatory Visit (INDEPENDENT_AMBULATORY_CARE_PROVIDER_SITE_OTHER): Payer: PPO | Admitting: Podiatry

## 2022-09-24 DIAGNOSIS — B351 Tinea unguium: Secondary | ICD-10-CM

## 2022-09-24 DIAGNOSIS — L814 Other melanin hyperpigmentation: Secondary | ICD-10-CM | POA: Diagnosis not present

## 2022-09-24 DIAGNOSIS — L603 Nail dystrophy: Secondary | ICD-10-CM | POA: Diagnosis not present

## 2022-09-24 DIAGNOSIS — M79674 Pain in right toe(s): Secondary | ICD-10-CM | POA: Diagnosis not present

## 2022-09-24 DIAGNOSIS — M79675 Pain in left toe(s): Secondary | ICD-10-CM

## 2022-09-24 NOTE — Patient Instructions (Signed)
You can start with UREA NAIL GEL on the toenails

## 2022-09-24 NOTE — Progress Notes (Signed)
Subjective: Chief Complaint  Patient presents with   Nail Problem    Nail trim      82 year old female presents the office for above concerns.  She was using the compound cream through [facility] if it was helping although it was too expensive so she stopped using this.  Last A1c 5.9 on 08/23/2022  Objective: AAO x3, NAD DP/PT pulses palpable bilaterally, CRT less than 3 seconds Hyperkeratotic lesion noted at the distal aspect left third toe without any underlying ulceration, drainage or signs of infection. The toenails are dystrophic with yellow, brown discoloration.  Dark discoloration noted in the nails of the left foot but they are all uniform in color without any extension of hyperpigmentation of the surrounding skin.  There is no edema, erythema or signs of infection.  Nails appear to be somewhat more soft in nature and slightly improved in color. No pain with calf compression, swelling, warmth, erythema  Assessment: Skin lesion; symptomatic onychomycosis  Plan: -All treatment options discussed with the patient including all alternatives, risks, complications.  -Sharply debrided the nails x 10 without any complications or bleeding.  Discussed urea nail gel. I sent the nails on the left foot for culture/pathology to [facility].  -Sharply debrided the hyperkeratotic lesion without any complications or bleeding as a courtesy as it was minimal.  Moisturizer, offloading daily. -Patient encouraged to call the office with any questions, concerns, change in symptoms.   Vivi Barrack DPM

## 2022-10-12 ENCOUNTER — Encounter: Payer: Self-pay | Admitting: Podiatry

## 2022-10-24 DIAGNOSIS — H209 Unspecified iridocyclitis: Secondary | ICD-10-CM | POA: Diagnosis not present

## 2022-10-24 DIAGNOSIS — H401122 Primary open-angle glaucoma, left eye, moderate stage: Secondary | ICD-10-CM | POA: Diagnosis not present

## 2022-10-24 DIAGNOSIS — Z961 Presence of intraocular lens: Secondary | ICD-10-CM | POA: Diagnosis not present

## 2022-10-24 DIAGNOSIS — H0288A Meibomian gland dysfunction right eye, upper and lower eyelids: Secondary | ICD-10-CM | POA: Diagnosis not present

## 2022-10-24 DIAGNOSIS — H0288B Meibomian gland dysfunction left eye, upper and lower eyelids: Secondary | ICD-10-CM | POA: Diagnosis not present

## 2022-10-24 DIAGNOSIS — H16223 Keratoconjunctivitis sicca, not specified as Sjogren's, bilateral: Secondary | ICD-10-CM | POA: Diagnosis not present

## 2022-10-24 DIAGNOSIS — E119 Type 2 diabetes mellitus without complications: Secondary | ICD-10-CM | POA: Diagnosis not present

## 2022-10-24 DIAGNOSIS — H401111 Primary open-angle glaucoma, right eye, mild stage: Secondary | ICD-10-CM | POA: Diagnosis not present

## 2022-10-24 DIAGNOSIS — H1045 Other chronic allergic conjunctivitis: Secondary | ICD-10-CM | POA: Diagnosis not present

## 2022-12-04 ENCOUNTER — Encounter: Payer: Self-pay | Admitting: Pharmacist

## 2022-12-04 NOTE — Progress Notes (Signed)
Pharmacy Quality Measure Review  This patient is appearing on a report for being at risk of failing the adherence measure for diabetes medications this calendar year.   Medication: Metformin 500 mg Last fill date: 10/29/22 for 90 day supply  Insurance report was not up to date. No action needed at this time.   Arbutus Leas, PharmD, BCPS, CPP Clinical Pharmacist Practitioner [facility] [phone]

## 2022-12-20 ENCOUNTER — Encounter: Payer: Self-pay | Admitting: Internal Medicine

## 2022-12-20 ENCOUNTER — Ambulatory Visit: Payer: PPO | Admitting: Internal Medicine

## 2022-12-20 VITALS — BP 120/70 | HR 78 | Temp 98.1°F | Ht 63.0 in | Wt 175.0 lb

## 2022-12-20 DIAGNOSIS — E119 Type 2 diabetes mellitus without complications: Secondary | ICD-10-CM | POA: Diagnosis not present

## 2022-12-20 DIAGNOSIS — K5904 Chronic idiopathic constipation: Secondary | ICD-10-CM

## 2022-12-20 DIAGNOSIS — F329 Major depressive disorder, single episode, unspecified: Secondary | ICD-10-CM

## 2022-12-20 DIAGNOSIS — Z23 Encounter for immunization: Secondary | ICD-10-CM | POA: Diagnosis not present

## 2022-12-20 DIAGNOSIS — Z7984 Long term (current) use of oral hypoglycemic drugs: Secondary | ICD-10-CM

## 2022-12-20 DIAGNOSIS — M06032 Rheumatoid arthritis without rheumatoid factor, left wrist: Secondary | ICD-10-CM

## 2022-12-20 DIAGNOSIS — D649 Anemia, unspecified: Secondary | ICD-10-CM

## 2022-12-20 DIAGNOSIS — M06031 Rheumatoid arthritis without rheumatoid factor, right wrist: Secondary | ICD-10-CM

## 2022-12-20 DIAGNOSIS — I10 Essential (primary) hypertension: Secondary | ICD-10-CM | POA: Diagnosis not present

## 2022-12-20 DIAGNOSIS — R5382 Chronic fatigue, unspecified: Secondary | ICD-10-CM | POA: Diagnosis not present

## 2022-12-20 LAB — CBC WITH DIFFERENTIAL/PLATELET
Basophils Absolute: 0 10*3/uL (ref 0.0–0.1)
Basophils Relative: 1 % (ref 0.0–3.0)
Eosinophils Absolute: 0.2 10*3/uL (ref 0.0–0.7)
Eosinophils Relative: 3.5 % (ref 0.0–5.0)
HCT: 35.1 % — ABNORMAL LOW (ref 36.0–46.0)
Hemoglobin: 11.5 g/dL — ABNORMAL LOW (ref 12.0–15.0)
Lymphocytes Relative: 26.6 % (ref 12.0–46.0)
Lymphs Abs: 1.2 10*3/uL (ref 0.7–4.0)
MCHC: 32.8 g/dL (ref 30.0–36.0)
MCV: 95.6 fL (ref 78.0–100.0)
Monocytes Absolute: 0.5 10*3/uL (ref 0.1–1.0)
Monocytes Relative: 11.2 % (ref 3.0–12.0)
Neutro Abs: 2.7 10*3/uL (ref 1.4–7.7)
Neutrophils Relative %: 57.7 % (ref 43.0–77.0)
Platelets: 239 10*3/uL (ref 150.0–400.0)
RBC: 3.67 Mil/uL — ABNORMAL LOW (ref 3.87–5.11)
RDW: 14 % (ref 11.5–15.5)
WBC: 4.7 10*3/uL (ref 4.0–10.5)

## 2022-12-20 LAB — COMPREHENSIVE METABOLIC PANEL
ALT: 10 U/L (ref 0–35)
AST: 19 U/L (ref 0–37)
Albumin: 3.8 g/dL (ref 3.5–5.2)
Alkaline Phosphatase: 79 U/L (ref 39–117)
BUN: 18 mg/dL (ref 6–23)
CO2: 29 meq/L (ref 19–32)
Calcium: 9.9 mg/dL (ref 8.4–10.5)
Chloride: 102 meq/L (ref 96–112)
Creatinine, Ser: 1.11 mg/dL (ref 0.40–1.20)
GFR: 46.03 mL/min — ABNORMAL LOW (ref >60.00)
Glucose, Bld: 95 mg/dL (ref 70–99)
Potassium: 4.6 meq/L (ref 3.5–5.1)
Sodium: 137 meq/L (ref 135–145)
Total Bilirubin: 0.5 mg/dL (ref 0.2–1.2)
Total Protein: 6.7 g/dL (ref 6.0–8.3)

## 2022-12-20 LAB — HEMOGLOBIN A1C: Hgb A1c MFr Bld: 5.9 % (ref 4.6–6.5)

## 2022-12-20 NOTE — Progress Notes (Signed)
Subjective:  Patient ID: Deanna Salazar, female    DOB: 07-Mar-1940  Age: 82 y.o. MRN: [id number]  CC: Medical Management of Chronic Issues (3 MNTH F/U)   HPI [name] presents for constipation, DM, HTN    Outpatient Medications Prior to Visit  Medication Sig Dispense Refill   acetaminophen (TYLENOL) 500 MG tablet Take 500-1,000 mg by mouth every 6 (six) hours as needed for mild pain, moderate pain, fever or headache.     albuterol (VENTOLIN HFA) 108 (90 Base) MCG/ACT inhaler Inhale 2 puffs into the lungs every 6 (six) hours as needed for wheezing or shortness of breath. 18 g 0   amLODipine (NORVASC) 10 MG tablet TAKE 1 TABLET BY MOUTH EVERY DAY 90 tablet 1   aspirin (ASPIRIN CHILDRENS) 81 MG chewable tablet Chew 1 tablet (81 mg total) by mouth daily. 36 tablet 11   Blood Glucose Monitoring Suppl (ONE TOUCH ULTRA 2) w/Device KIT Use to check blood sugars daily 1 kit 0   brimonidine (ALPHAGAN) 0.2 % ophthalmic solution Place 1 drop into both eyes in the morning and at bedtime.     carvedilol (COREG) 25 MG tablet TAKE 1 TABLET(25 MG) BY MOUTH TWICE DAILY WITH A MEAL 180 tablet 1   cholecalciferol (VITAMIN D) 1000 units tablet Take 1,000 Units by mouth daily.     famotidine (PEPCID) 40 MG tablet TAKE 1 TABLET BY MOUTH EVERY DAY 30 tablet 5   fluticasone (FLONASE) 50 MCG/ACT nasal spray Place 2 sprays into both nostrils daily. 16 g 6   folic acid (FOLVITE) 1 MG tablet Take 1 tablet daily     furosemide (LASIX) 20 MG tablet TAKE 1 TO 2 TABLETS BY MOUTH DAILY AS NEEDED FOR SWELLING 60 tablet 11   glucose blood (ONETOUCH ULTRA) test strip TEST GLUCOSE LEVELS ONCE DAILY 100 strip 3   Lancets (ONETOUCH DELICA PLUS LANCET30G) MISC USE TO TEST BLOOD SUGAR LEVELS 100 each 2   latanoprost (XALATAN) 0.005 % ophthalmic solution Place 1 drop into both eyes at bedtime.  3   levothyroxine (SYNTHROID) 25 MCG tablet TAKE 1 TABLET BY MOUTH EVERY DAY BEFORE BREAKFAST 90 tablet 1   lidocaine 4 % Place  1 patch onto the skin daily. 12 hours on and 12 hours off 15 patch 0   metFORMIN (GLUCOPHAGE) 500 MG tablet TAKE 1 TABLET BY MOUTH TWICE A DAY WITH FOOD 180 tablet 1   methotrexate 2.5 MG tablet Take 20 mg by mouth once a week.     methylPREDNISolone (MEDROL DOSEPAK) 4 MG TBPK tablet As directed 21 tablet 0   Multiple Vitamins-Minerals (PRESERVISION AREDS 2+MULTI VIT PO) Take 1 capsule by mouth in the morning and at bedtime.     Polyethyl Glycol-Propyl Glycol (SYSTANE OP) Apply 1 drop to eye as needed.     Polyethylene Glycol 3350 (MIRALAX PO) Take by mouth.     triamcinolone cream (KENALOG) 0.1 % Apply 1 Application topically 4 (four) times daily. 80 g 3   zolpidem (AMBIEN) 10 MG tablet Take 10 mg at at bedtime as needed. 30 tablet 5   lubiprostone (AMITIZA) 24 MCG capsule Take 1 capsule (24 mcg total) by mouth 2 (two) times daily with a meal. 60 capsule 3   tiZANidine (ZANAFLEX) 4 MG tablet Take 1 tablet (4 mg total) by mouth every 6 (six) hours as needed for muscle spasms. (Patient not taking: Reported on 12/20/2022) 30 tablet 0   promethazine-dextromethorphan (PROMETHAZINE-DM) 6.25-15 MG/5ML syrup Take 5-10 mLs by  mouth 4 (four) times daily as needed for cough. 240 mL 0   No facility-administered medications prior to visit.    ROS: Review of Systems  Constitutional:  Negative for activity change, appetite change, chills, fatigue and unexpected weight change.  HENT:  Negative for congestion, mouth sores and sinus pressure.   Eyes:  Negative for visual disturbance.  Respiratory:  Negative for cough and chest tightness.   Gastrointestinal:  Positive for constipation. Negative for abdominal pain and nausea.  Genitourinary:  Negative for difficulty urinating, frequency and vaginal pain.  Musculoskeletal:  Positive for arthralgias. Negative for back pain and gait problem.  Skin:  Negative for pallor and rash.  Neurological:  Negative for dizziness, tremors, weakness, numbness and headaches.   Psychiatric/Behavioral:  Negative for confusion, sleep disturbance and suicidal ideas.     Objective:  BP 120/70 (BP Location: Right Arm, Patient Position: Sitting, Cuff Size: Normal)   Pulse 78   Temp 98.1 F (36.7 C) (Oral)   Ht 5\' 3"  (1.6 m)   Wt 175 lb (79.4 kg)   SpO2 96%   BMI 31.00 kg/m   BP Readings from Last 3 Encounters:  12/20/22 120/70  09/20/22 122/72  09/11/22 (!) 167/78    Wt Readings from Last 3 Encounters:  12/20/22 175 lb (79.4 kg)  09/20/22 176 lb 6.4 oz (80 kg)  08/23/22 178 lb (80.7 kg)    Physical Exam Constitutional:      General: She is not in acute distress.    Appearance: She is well-developed. She is obese.  HENT:     Head: Normocephalic.     Right Ear: External ear normal.     Left Ear: External ear normal.     Nose: Nose normal.  Eyes:     General:        Right eye: No discharge.        Left eye: No discharge.     Conjunctiva/sclera: Conjunctivae normal.     Pupils: Pupils are equal, round, and reactive to light.  Neck:     Thyroid: No thyromegaly.     Vascular: No JVD.     Trachea: No tracheal deviation.  Cardiovascular:     Rate and Rhythm: Normal rate and regular rhythm.     Heart sounds: Normal heart sounds.  Pulmonary:     Effort: No respiratory distress.     Breath sounds: No stridor. No wheezing.  Abdominal:     General: Bowel sounds are normal. There is no distension.     Palpations: Abdomen is soft. There is no mass.     Tenderness: There is no abdominal tenderness. There is no guarding or rebound.  Musculoskeletal:        General: No tenderness.     Cervical back: Normal range of motion and neck supple. No rigidity.  Lymphadenopathy:     Cervical: No cervical adenopathy.  Skin:    Findings: No erythema or rash.  Neurological:     Cranial Nerves: No cranial nerve deficit.     Motor: No abnormal muscle tone.     Coordination: Coordination normal.     Deep Tendon Reflexes: Reflexes normal.  Psychiatric:         Behavior: Behavior normal.        Thought Content: Thought content normal.        Judgment: Judgment normal.     Lab Results  Component Value Date   WBC 4.4 08/23/2022   HGB 11.7 (L) 08/23/2022  HCT 36.6 08/23/2022   PLT 236.0 08/23/2022   GLUCOSE 94 08/23/2022   CHOL 187 08/17/2020   TRIG 132.0 08/17/2020   HDL 46.00 08/17/2020   LDLDIRECT 180.9 07/26/2009   LDLCALC 114 (H) 08/17/2020   ALT 11 08/23/2022   AST 20 08/23/2022   NA 140 08/23/2022   K 3.5 08/23/2022   CL 101 08/23/2022   CREATININE 0.95 08/23/2022   BUN 14 08/23/2022   CO2 32 08/23/2022   TSH 2.46 08/23/2022   HGBA1C 5.9 08/23/2022   MICROALBUR <0.7 05/24/2022    No results found.  Assessment & Plan:   Problem List Items Addressed This Visit     Depression   Pt is not depressed now Pt is waking up at 2-3 am after 4 hrs on Zolpidem      Essential hypertension    Lasix prn -  use 1/2 tab Pt is getting labs w/Rheumatology every 2 mo (CMET, CBC)      Relevant Orders   CBC with Differential/Platelet   Comprehensive metabolic panel   Hemoglobin A1c   Iron, TIBC and Ferritin Panel   Constipation   Use Miralax 2-4 scoops and Dulcolax 2 tabs twice a week D/c Amitiza      Rheumatoid arthritis (HCC)   On MTX Will get labs      Relevant Orders   CBC with Differential/Platelet   Comprehensive metabolic panel   Hemoglobin A1c   Iron, TIBC and Ferritin Panel   Chronic fatigue    Pt needs 7-8 hrs of sleep to feel better      Diabetes type 2, controlled (HCC) - Primary   On Metformin      Anemia   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     Follow-up: Return in about 3 months (around 03/20/2023) for a follow-up visit.  Sonda Primes, MD

## 2022-12-20 NOTE — Assessment & Plan Note (Signed)
On MTX Will get labs

## 2022-12-20 NOTE — Assessment & Plan Note (Signed)
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

## 2022-12-20 NOTE — Assessment & Plan Note (Signed)
On Metformin 

## 2022-12-20 NOTE — Addendum Note (Signed)
Addended by: Delsa Grana R on: 12/20/2022 11:10 AM   Modules accepted: Orders

## 2022-12-20 NOTE — Assessment & Plan Note (Signed)
Lasix prn -  use 1/2 tab Pt is getting labs w/Rheumatology every 2 mo (CMET, CBC)

## 2022-12-20 NOTE — Assessment & Plan Note (Signed)
Pt needs 7-8 hrs of sleep to feel better

## 2022-12-20 NOTE — Assessment & Plan Note (Signed)
Use Miralax 2-4 scoops and Dulcolax 2 tabs twice a week D/c Amitiza

## 2022-12-20 NOTE — Assessment & Plan Note (Signed)
Pt is not depressed now  Pt is waking up at 2-3 am after 4 hrs on Zolpidem

## 2022-12-20 NOTE — Patient Instructions (Signed)
Use Miralax 2-4 scoops and Dulcolax 2 tabs twice a week

## 2022-12-21 LAB — IRON,TIBC AND FERRITIN PANEL
%SAT: 22 % (ref 16–45)
Ferritin: 134 ng/mL (ref 16–288)
Iron: 64 ug/dL (ref 45–160)
TIBC: 290 ug/dL (ref 250–450)

## 2022-12-24 ENCOUNTER — Ambulatory Visit (INDEPENDENT_AMBULATORY_CARE_PROVIDER_SITE_OTHER): Payer: PPO

## 2022-12-24 ENCOUNTER — Encounter: Payer: Self-pay | Admitting: Podiatry

## 2022-12-24 ENCOUNTER — Ambulatory Visit (INDEPENDENT_AMBULATORY_CARE_PROVIDER_SITE_OTHER): Payer: PPO | Admitting: Podiatry

## 2022-12-24 DIAGNOSIS — M7701 Medial epicondylitis, right elbow: Secondary | ICD-10-CM | POA: Diagnosis not present

## 2022-12-24 DIAGNOSIS — M79674 Pain in right toe(s): Secondary | ICD-10-CM

## 2022-12-24 DIAGNOSIS — D2372 Other benign neoplasm of skin of left lower limb, including hip: Secondary | ICD-10-CM | POA: Diagnosis not present

## 2022-12-24 DIAGNOSIS — M76821 Posterior tibial tendinitis, right leg: Secondary | ICD-10-CM | POA: Diagnosis not present

## 2022-12-24 DIAGNOSIS — M79675 Pain in left toe(s): Secondary | ICD-10-CM | POA: Diagnosis not present

## 2022-12-24 DIAGNOSIS — M778 Other enthesopathies, not elsewhere classified: Secondary | ICD-10-CM | POA: Diagnosis not present

## 2022-12-24 DIAGNOSIS — B351 Tinea unguium: Secondary | ICD-10-CM | POA: Diagnosis not present

## 2022-12-24 NOTE — Progress Notes (Signed)
Subjective: Chief Complaint  Patient presents with   [facility]    RM#13 DFC/foot fungus , right foot pain X 2 weeks     82 year old female presents the office for above concerns.  She has been discomfort of the right foot for last 2 weeks and she gets intermittent swelling.  She points to the medial aspect the foot where she gets the discomfort.  No recent treatment.  No injuries.  Last A1c 5.9 on 12/20/2022  Objective: AAO x3, NAD DP/PT pulses palpable bilaterally, CRT less than 3 seconds Hyperkeratotic tissue left foot submetatarsal 4 without any underlying ulceration, drainage or any signs of infection. The toenails are dystrophic with yellow, brown discoloration.  There is still dark discoloration present to the nails on the left foot.  Do not appear to be any worsening and there is no extension into the surrounding skin.  These appear to be uniform in all the toenails.  She gets tenderness nails 1-5 bilaterally. On the medial aspect of the right foot more on the talonavicular joint there is area of swelling along the course the posterior tibial tendon where she gets discomfort.  She is some discomfort of the anterior medial ankle joint.  There is no area pinpoint tenderness.  Decreased medial arch upon weightbearing. No pain with calf compression, swelling, warmth, erythema  Assessment: Skin lesion; symptomatic onychomycosis; PTTD/capsulitis  Plan: -All treatment options discussed with the patient including all alternatives, risks, complications.  -X-rays obtained reviewed.  3 views of the left foot were obtained.  No evidence of acute fracture.  Decreased medial arch height.  Spurring present at the talonavicular joint. -Sharply debrided the nails x 10 without any complications or bleeding.  She wants to use topical medication.  I ordered a compound cream through [employer]. -Trilock ankle brace was dispensed.  This is to help facilitate soft tissue healing to help with pain.   Referral to physical therapy.  Discussed shoes, good arch support.  If symptoms persist recommend MRI. -Sharply debrided the hyperkeratotic lesion without any complications or bleeding as a courtesy as it was minimal.  Moisturizer, offloading daily. -Patient encouraged to call the office with any questions, concerns, change in symptoms.   Vivi Barrack DPM

## 2022-12-27 ENCOUNTER — Telehealth: Payer: Self-pay

## 2022-12-27 NOTE — Telephone Encounter (Signed)
Copied from CRM [id number]. Topic: Clinical - Request for Lab/Test Order >> Dec 27, 2022  9:32 AM Steele Sizer wrote: Reason for CRM: Patient would like to request her lab results regarding her kidneys to be sent over to her [facility], phone number is [phone] and fax number is [phone]. PA is listed as Azucena Fallen.

## 2023-01-01 ENCOUNTER — Telehealth: Payer: Self-pay | Admitting: Internal Medicine

## 2023-01-01 NOTE — Telephone Encounter (Signed)
I able to print and fax over pts recent labs.

## 2023-01-01 NOTE — Telephone Encounter (Signed)
Copied from CRM [id number]. Topic: General - Call Back - No Documentation >> Jan 01, 2023 10:54 AM Samuel Jester B wrote: Reason for CRM: Pt called back in regarding missing a call and would like to request a call back.

## 2023-01-03 ENCOUNTER — Ambulatory Visit (INDEPENDENT_AMBULATORY_CARE_PROVIDER_SITE_OTHER): Payer: PPO

## 2023-01-03 ENCOUNTER — Ambulatory Visit
Admission: EM | Admit: 2023-01-03 | Discharge: 2023-01-03 | Discharge status: Against Medical Advice | Payer: PPO | Attending: Physician Assistant | Admitting: Physician Assistant

## 2023-01-03 ENCOUNTER — Other Ambulatory Visit: Payer: Self-pay

## 2023-01-03 ENCOUNTER — Encounter: Payer: Self-pay | Admitting: Emergency Medicine

## 2023-01-03 ENCOUNTER — Telehealth: Payer: Self-pay

## 2023-01-03 ENCOUNTER — Ambulatory Visit: Payer: Self-pay | Admitting: Internal Medicine

## 2023-01-03 DIAGNOSIS — R051 Acute cough: Secondary | ICD-10-CM

## 2023-01-03 DIAGNOSIS — R058 Other specified cough: Secondary | ICD-10-CM | POA: Diagnosis not present

## 2023-01-03 DIAGNOSIS — R918 Other nonspecific abnormal finding of lung field: Secondary | ICD-10-CM | POA: Diagnosis not present

## 2023-01-03 DIAGNOSIS — I771 Stricture of artery: Secondary | ICD-10-CM | POA: Diagnosis not present

## 2023-01-03 NOTE — Telephone Encounter (Signed)
  Chief Complaint: productive cough Symptoms: hoarse voice, mucus yellow/green/blood tinged  Frequency: constant  Disposition: [] ED /[x] Urgent Care (no appt availability in office) / [] Appointment(In office/virtual)/ []  [facility]/ [] Home Care/ [] Refused Recommended Disposition /[] [facility]/ []  Follow-up with PCP Additional Notes: Pt went to bed 12/20 feeling great. Woke up on 12/21 and was hoarse. On 12/24, pt started coughing and it became productive. Pt coughing up yellowish/green/blood tinged mucus. Pt denies all other symptoms. No fever. Cough sound barky while E2C2 on phone. Pt going to urgent care next to house this morning. Pt was reluctant to go and wanted to give it more time, but RN advised pt to not wait and go be seen. No appts available at provider office today. Pt understood care instructions.             Reason for Disposition  SEVERE coughing spells (e.g., whooping sound after coughing, vomiting after coughing)  Answer Assessment - Initial Assessment Questions 1. ONSET: "When did the cough begin?"      2 days ago 2. SEVERITY: "How bad is the cough today?"      Doesn't hurt, just thick mucus 3. SPUTUM: "Describe the color of your sputum" (none, dry cough; clear, white, yellow, green)     Green and bloody 4. HEMOPTYSIS: "Are you coughing up any blood?" If so ask: "How much?" (flecks, streaks, tablespoons, etc.)     Just a little bit mixed in with yellow mucus  5. DIFFICULTY BREATHING: "Are you having difficulty breathing?" If Yes, ask: "How bad is it?" (e.g., mild, moderate, severe)    - MILD: No SOB at rest, mild SOB with walking, speaks normally in sentences, can lie down, no retractions, pulse < 100.    - MODERATE: SOB at rest, SOB with minimal exertion and prefers to sit, cannot lie down flat, speaks in phrases, mild retractions, audible wheezing, pulse 100-120.    - SEVERE: Very SOB at rest, speaks in single words, struggling to breathe,  sitting hunched forward, retractions, pulse > 120      Denies- breathing treatment  6. FEVER: "Do you have a fever?" If Yes, ask: "What is your temperature, how was it measured, and when did it start?"     No  7. CARDIAC HISTORY: "Do you have any history of heart disease?" (e.g., heart attack, congestive heart failure)      denies 8. LUNG HISTORY: "Do you have any history of lung disease?"  (e.g., pulmonary embolus, asthma, emphysema)     denies 9. PE RISK FACTORS: "Do you have a history of blood clots?" (or: recent major surgery, recent prolonged travel, bedridden)     denies 10. OTHER SYMPTOMS: "Do you have any other symptoms?" (e.g., runny nose, wheezing, chest pain)      Congestion, hoarse throat, barking cough  Protocols used: Cough - Acute Productive-A-AH

## 2023-01-03 NOTE — Telephone Encounter (Signed)
Returned call to patients Daughter (DPR on file). Daughter states upon call that she left (patient, Deanna Salazar) due to wait time. I reviewed ED/UC Timeline in detail explaining all correspondence and information on what was provided to patient as well as check-in/waiting times, procedures as well as communications.  Patient stated to daughter that she left because she waited "hours" in the waiting area "and hours" in the room.  Discussed with daughter our goal to care for everyone as promptly as possible adhering to Triage protocols, emergencies and other things that may delay walk-in's and patient care for non emergencies and/or appointments.  Yancey Flemings CMA

## 2023-01-03 NOTE — ED Triage Notes (Signed)
Pt reports productive cough, chest congestion, and hoarseness x6 days. No known sick contacts. Some relief with mucinex dm.

## 2023-01-03 NOTE — ED Notes (Signed)
See Telephone Call.

## 2023-01-03 NOTE — Telephone Encounter (Signed)
LM on VM to check on patient. Note: Patient left UC earlier in day before CXR result received and any discussion regarding treatment/diagnosis.  Yancey Flemings CMA

## 2023-01-04 ENCOUNTER — Ambulatory Visit (INDEPENDENT_AMBULATORY_CARE_PROVIDER_SITE_OTHER): Payer: PPO | Admitting: Family Medicine

## 2023-01-04 ENCOUNTER — Encounter: Payer: Self-pay | Admitting: Family Medicine

## 2023-01-04 VITALS — BP 120/78 | HR 66 | Temp 98.2°F | Ht 63.0 in | Wt 175.2 lb

## 2023-01-04 DIAGNOSIS — J069 Acute upper respiratory infection, unspecified: Secondary | ICD-10-CM | POA: Diagnosis not present

## 2023-01-04 DIAGNOSIS — J90 Pleural effusion, not elsewhere classified: Secondary | ICD-10-CM

## 2023-01-04 DIAGNOSIS — R051 Acute cough: Secondary | ICD-10-CM | POA: Diagnosis not present

## 2023-01-04 DIAGNOSIS — B9689 Other specified bacterial agents as the cause of diseases classified elsewhere: Secondary | ICD-10-CM | POA: Diagnosis not present

## 2023-01-04 MED ORDER — AZITHROMYCIN 250 MG PO TABS
ORAL_TABLET | ORAL | 0 refills | Status: DC
Start: 2023-01-04 — End: 2023-01-15

## 2023-01-04 NOTE — Patient Instructions (Signed)
Your xray from yesterday showed a trace amount of fluid in the lungs.   I have sent in azithromycin for you to take.  Take 2 tablets today, then 1 tablet daily for the next 4 days.  May continue with mucinex  Follow-up with me for new or worsening symptoms.

## 2023-01-04 NOTE — Progress Notes (Signed)
   Acute Office Visit  Subjective:     Patient ID: Deanna Salazar, female    DOB: 06/09/1940, 82 y.o.   MRN: [id number]  Chief Complaint  Patient presents with   Cough    Productive cough for the past week. Notes that sputum was clear initially but has now transitioned to yellow/green with tinge of blood in it. Currently treating with mucinex DM and ricola lozenges.   Shortness of Breath    Shortness of breath especially when coughing or talking for a long time. Notes of no chest pain    Cough   Patient is in today for evaluation of cough, SOB and fatigue by the end of the day, for the last 8 days. Has tried mucinex and ricola cough drops  Had CXR yesterday, reviewed by me, with trace B pleural effusions.  Denies known sick contacts, Denies abdominal pain, nausea, vomiting, diarrhea, rash, fever, chills, other symptoms.  Medical hx as outlined below.  Review of Systems  Respiratory:  Positive for cough.    Per HPI      Objective:    BP 120/78 (BP Location: Right Arm, Patient Position: Sitting)   Pulse 66   Temp 98.2 F (36.8 C) (Oral)   Ht 5\' 3"  (1.6 m)   Wt 175 lb 3.2 oz (79.5 kg)   SpO2 99%   BMI 31.04 kg/m    Physical Exam Vitals and nursing note reviewed.  Constitutional:      General: She is not in acute distress.    Comments: Appears fatigued  HENT:     Head: Normocephalic and atraumatic.     Nose: No congestion.     Mouth/Throat:     Mouth: Mucous membranes are moist.     Pharynx: Oropharynx is clear. No oropharyngeal exudate or posterior oropharyngeal erythema.     Comments: Oropharyngeal cobblestoning   Eyes:     Extraocular Movements: Extraocular movements intact.  Cardiovascular:     Rate and Rhythm: Normal rate and regular rhythm.     Heart sounds: Normal heart sounds.  Pulmonary:     Effort: Pulmonary effort is normal. No respiratory distress.     Breath sounds: No wheezing, rhonchi or rales.     Comments: Productive  cough Musculoskeletal:        General: Normal range of motion.     Cervical back: Normal range of motion and neck supple.  Lymphadenopathy:     Cervical: Cervical adenopathy present.  Skin:    General: Skin is warm and dry.  Neurological:     Mental Status: She is alert.     No results found for any visits on 01/04/23.      Assessment & Plan:  1. Bacterial URI (Primary)  - azithromycin (ZITHROMAX Z-PAK) 250 MG tablet; Take 2 tablets (500 mg) PO today, then 1 tablet (250 mg) PO daily x4 days.  Dispense: 6 tablet; Refill: 0  2. Acute cough  -May continue mucinex  3. Pleural Effusion  -Suspect this is related to recent illness     Meds ordered this encounter  Medications   azithromycin (ZITHROMAX Z-PAK) 250 MG tablet    Sig: Take 2 tablets (500 mg) PO today, then 1 tablet (250 mg) PO daily x4 days.    Dispense:  6 tablet    Refill:  0    Return if symptoms worsen or fail to improve.  Moshe Cipro, FNP

## 2023-01-12 ENCOUNTER — Other Ambulatory Visit: Payer: Self-pay | Admitting: Internal Medicine

## 2023-01-14 ENCOUNTER — Ambulatory Visit: Payer: Self-pay | Admitting: Medical Specialty

## 2023-01-14 DIAGNOSIS — B9689 Other specified bacterial agents as the cause of diseases classified elsewhere: Secondary | ICD-10-CM

## 2023-01-14 DIAGNOSIS — J069 Acute upper respiratory infection, unspecified: Secondary | ICD-10-CM

## 2023-01-14 NOTE — Telephone Encounter (Signed)
  Chief Complaint: cough Symptoms: cough, hoarse voice Frequency: several weeks Pertinent Negatives: Patient denies blood in sputum, fever Disposition: [] ED /[] Urgent Care (no appt availability in office) / [] Appointment(In office/virtual)/ []  [facility]/ [] Home Care/ [] Refused Recommended Disposition /[] [place]/ [x]  Follow-up with PCP Additional Notes: Patient called stating she was evaluated by provider last week and given antibiotics, she reports she has finished the medication, but that symptoms are returning. States she has yellow phlegm productive cough and mild SOB with activity. Denies fever, blood in sputum. During call, barking cough is noted from patient. Pt is inquiring if cough medication can be called in for her. Per protocol, pt to be evaluated within 24 hours- pt is requesting a callback. Alerting PCP for review. Per chart review, pt last seen at [facility] on 01/04/23.  This RN attempted to contact assigned PCP office- spoke with Harland, RN, [facility]- advised she is not a patient at this facility.   Copied from CRM [id number]. Topic: Clinical - Red Word Triage >> Jan 14, 2023 10:49 AM Macario HERO wrote: Red Word that prompted transfer to Nurse Triage: Patient called requesting a call back from Corean Ku because she saw her last week and her cough is pretty bad and would she be able to call in any prescription for the cough. Sometimes feels like she is losing her breath when she coughs. Reason for Disposition  SEVERE coughing spells (e.g., whooping sound after coughing, vomiting after coughing)  Answer Assessment - Initial Assessment Questions 1. ONSET: When did the cough begin?      Approx 2 weeks or more 2. SEVERITY: How bad is the cough today?      Real bad 3. SPUTUM: Describe the color of your sputum (none, dry cough; clear, white, yellow, green)     Clear, or yellow 4. HEMOPTYSIS: Are you coughing up any blood? If so  ask: How much? (flecks, streaks, tablespoons, etc.)     Denies 5. DIFFICULTY BREATHING: Are you having difficulty breathing? If Yes, ask: How bad is it? (e.g., mild, moderate, severe)    - MILD: No SOB at rest, mild SOB with walking, speaks normally in sentences, can lie down, no retractions, pulse < 100.    - MODERATE: SOB at rest, SOB with minimal exertion and prefers to sit, cannot lie down flat, speaks in phrases, mild retractions, audible wheezing, pulse 100-120.    - SEVERE: Very SOB at rest, speaks in single words, struggling to breathe, sitting hunched forward, retractions, pulse > 120      Mild, with a lot of coughing 6. FEVER: Do you have a fever? If Yes, ask: What is your temperature, how was it measured, and when did it start?     Denies 7. CARDIAC HISTORY: Do you have any history of heart disease? (e.g., heart attack, congestive heart failure)      Denies 8. LUNG HISTORY: Do you have any history of lung disease?  (e.g., pulmonary embolus, asthma, emphysema)     Denies 9. PE RISK FACTORS: Do you have a history of blood clots? (or: recent major surgery, recent prolonged travel, bedridden)     Denies 10. OTHER SYMPTOMS: Do you have any other symptoms? (e.g., runny nose, wheezing, chest pain)       A little raspy voice, runny nose  Protocols used: Cough - Acute Productive-A-AH

## 2023-01-15 MED ORDER — AZITHROMYCIN 250 MG PO TABS
ORAL_TABLET | ORAL | 0 refills | Status: DC
Start: 2023-01-15 — End: 2023-01-15

## 2023-01-15 NOTE — Addendum Note (Signed)
 Addended by: Tresa Garter on: 01/15/2023 07:46 AM   Modules accepted: Orders

## 2023-01-15 NOTE — Telephone Encounter (Addendum)
 I renewed her Z-Pak.  Please fax-unable to send out chronically.  Office visit if problems.  Thank you

## 2023-01-15 NOTE — Addendum Note (Signed)
 Addended by: Tresa Garter on: 01/15/2023 07:41 AM   Modules accepted: Orders

## 2023-01-22 ENCOUNTER — Other Ambulatory Visit: Payer: Self-pay | Admitting: Internal Medicine

## 2023-01-30 ENCOUNTER — Telehealth: Payer: Self-pay | Admitting: Podiatry

## 2023-01-30 NOTE — Telephone Encounter (Signed)
Pt called and cream was ordered for foot last visit and the place called [facility]) but her daughter was not there so she did not order it. Her daughter handles her finances and they were wanting her card information.  She could not find the information and I checked chart and gave her the number for [employer]. It was from December so they should still have the order.

## 2023-02-07 ENCOUNTER — Other Ambulatory Visit: Payer: Self-pay | Admitting: Internal Medicine

## 2023-02-16 ENCOUNTER — Other Ambulatory Visit: Payer: Self-pay | Admitting: Internal Medicine

## 2023-02-20 ENCOUNTER — Other Ambulatory Visit: Payer: Self-pay | Admitting: Internal Medicine

## 2023-03-14 ENCOUNTER — Ambulatory Visit: Payer: PPO

## 2023-03-14 VITALS — Ht 63.0 in | Wt 173.0 lb

## 2023-03-14 DIAGNOSIS — Z0000 Encounter for general adult medical examination without abnormal findings: Secondary | ICD-10-CM

## 2023-03-14 NOTE — Patient Instructions (Signed)
 Deanna Salazar , Thank you for taking time to come for your Medicare Wellness Visit. I appreciate your ongoing commitment to your health goals. Please review the following plan we discussed and let me know if I can assist you in the future.   Referrals/Orders/Follow-Ups/Clinician Recommendations: It was nice talking to you today.  Keep up the good work.   This is a list of the screening recommended for you and due dates:  Health Maintenance  Topic Date Due   COVID-19 Vaccine (4 - 2024-25 season) 09/09/2022   Eye exam for diabetics  10/21/2022   Complete foot exam   05/14/2023   Yearly kidney health urinalysis for diabetes  05/24/2023   Yearly kidney function blood test for diabetes  12/20/2023   Medicare Annual Wellness Visit  03/13/2024   DTaP/Tdap/Td vaccine (2 - Tdap) 04/04/2024   Pneumonia Vaccine  Completed   Flu Shot  Completed   DEXA scan (bone density measurement)  Completed   Zoster (Shingles) Vaccine  Completed   HPV Vaccine  Aged Out    Advanced directives: (Declined) Advance directive discussed with you today. Even though you declined this today, please call our office should you change your mind, and we can give you the proper paperwork for you to fill out.  Next Medicare Annual Wellness Visit scheduled for next year: Yes

## 2023-03-14 NOTE — Progress Notes (Addendum)
 Subjective:   Deanna Salazar is a 83 y.o. who presents for a Medicare Wellness preventive visit.  Visit Complete: Virtual I connected with  Lorenz Coaster on 03/14/23 by a audio enabled telemedicine application and verified that I am speaking with the correct person using two identifiers.  Patient Location: Home  Provider Location: Home Office  I discussed the limitations of evaluation and management by telemedicine. The patient expressed understanding and agreed to proceed.  Vital Signs: Because this visit was a virtual/telehealth visit, some criteria may be missing or patient reported. Any vitals not documented were not able to be obtained and vitals that have been documented are patient reported.  VideoDeclined- This patient declined Librarian, academic. Therefore the visit was completed with audio only.  AWV Questionnaire: Yes: Patient Medicare AWV questionnaire was completed by the patient on 03/14/2023; I have confirmed that all information answered by patient is correct and no changes since this date.        Objective:    Today's Vitals   03/14/23 1335 03/14/23 1339  Weight:  173 lb (78.5 kg)  Height:  5\' 3"  (1.6 m)  PainSc: 8     Body mass index is 30.65 kg/m.     03/14/2023    1:48 PM 03/13/2022    2:08 PM 12/04/2021    3:37 AM 03/07/2021   11:21 AM 02/23/2020    8:25 AM 02/01/2020   12:36 PM 09/08/2018    9:44 AM  Advanced Directives  Does Patient Have a Medical Advance Directive? No No No No No No No  Would patient like information on creating a medical advance directive? No - Patient declined No - Patient declined  No - Patient declined No - Patient declined  No - Patient declined    Current Medications (verified) Outpatient Encounter Medications as of 03/14/2023  Medication Sig   acetaminophen (TYLENOL) 500 MG tablet Take 500-1,000 mg by mouth every 6 (six) hours as needed for mild pain, moderate pain, fever or headache.   albuterol  (VENTOLIN HFA) 108 (90 Base) MCG/ACT inhaler Inhale 2 puffs into the lungs every 6 (six) hours as needed for wheezing or shortness of breath.   amLODipine (NORVASC) 10 MG tablet TAKE 1 TABLET BY MOUTH EVERY DAY   aspirin (ASPIRIN CHILDRENS) 81 MG chewable tablet Chew 1 tablet (81 mg total) by mouth daily.   Blood Glucose Monitoring Suppl (ONE TOUCH ULTRA 2) w/Device KIT Use to check blood sugars daily   brimonidine (ALPHAGAN) 0.2 % ophthalmic solution Place 1 drop into both eyes in the morning and at bedtime.   carvedilol (COREG) 25 MG tablet TAKE 1 TABLET(25 MG) BY MOUTH TWICE DAILY WITH A MEAL   cholecalciferol (VITAMIN D) 1000 units tablet Take 1,000 Units by mouth daily.   famotidine (PEPCID) 40 MG tablet TAKE 1 TABLET BY MOUTH EVERY DAY   fluticasone (FLONASE) 50 MCG/ACT nasal spray Place 2 sprays into both nostrils daily.   folic acid (FOLVITE) 1 MG tablet Take 1 tablet daily   furosemide (LASIX) 20 MG tablet TAKE 1 TO 2 TABLETS BY MOUTH DAILY AS NEEDED FOR SWELLING   Lancets (ONETOUCH DELICA PLUS LANCET33G) MISC USE TO TEST BLOOD SUGAR LEVELS   latanoprost (XALATAN) 0.005 % ophthalmic solution Place 1 drop into both eyes at bedtime.   levothyroxine (SYNTHROID) 25 MCG tablet TAKE 1 TABLET BY MOUTH EVERY DAY BEFORE BREAKFAST   lidocaine 4 % Place 1 patch onto the skin daily. 12 hours on  and 12 hours off   metFORMIN (GLUCOPHAGE) 500 MG tablet TAKE 1 TABLET BY MOUTH TWICE A DAY WITH FOOD   methotrexate 2.5 MG tablet Take 20 mg by mouth once a week.   Multiple Vitamins-Minerals (PRESERVISION AREDS 2+MULTI VIT PO) Take 1 capsule by mouth in the morning and at bedtime.   ONETOUCH ULTRA test strip TEST GLUCOSE LEVELS ONCE DAILY   Polyethyl Glycol-Propyl Glycol (SYSTANE OP) Apply 1 drop to eye as needed.   Polyethylene Glycol 3350 (MIRALAX PO) Take by mouth.   triamcinolone cream (KENALOG) 0.1 % Apply 1 Application topically 4 (four) times daily.   zolpidem (AMBIEN) 10 MG tablet TAKE 10 MG BY  MOUTH AT BEDTIME AS NEEDED.   cyclobenzaprine (FLEXERIL) 10 MG tablet TAKE 1 TABLET BY MOUTH AT BEDTIME AS NEEDED FOR MUSCLE SPASMS (Patient not taking: Reported on 03/14/2023)   methylPREDNISolone (MEDROL DOSEPAK) 4 MG TBPK tablet As directed (Patient not taking: Reported on 03/14/2023)   tiZANidine (ZANAFLEX) 4 MG tablet Take 1 tablet (4 mg total) by mouth every 6 (six) hours as needed for muscle spasms. (Patient not taking: Reported on 03/14/2023)   No facility-administered encounter medications on file as of 03/14/2023.    Allergies (verified) Codeine, Hydrocodone, Hydrocodone-acetaminophen, Hydroxyzine, Lovastatin, Pantoprazole, Pepcid [famotidine], Telmisartan, Tramadol hcl, and Trazodone and nefazodone   History: Past Medical History:  Diagnosis Date   Abdominal pain, generalized 02/05/2008   ABDOMINAL PAIN-EPIGASTRIC 03/24/2009   Acute bronchitis 02/15/2007   Adenomatous colon polyp 04/2000   Arthritis    ASCUS favor benign 10/2011   negative high risk HPV screen   BACK PAIN 07/20/2008   Breast cancer (HCC) 2009   l, hx of XRT Dr Truett Perna, Left side   BREAST CANCER, HX OF 04/16/2007   CHEST PAIN 02/23/2010   COLONIC POLYPS, ADENOMATOUS, HX OF 10/01/2007   CONSTIPATION, CHRONIC 05/10/2007   Dysuria 04/06/2008   FATIGUE 12/26/2006   Fatty liver    FATTY LIVER DISEASE 11/19/2006   FIBROMYALGIA 11/19/2006   GERD 11/19/2006   Dr Russella Dar   Glaucoma 07/2017   left eye   HEMATOCHEZIA 05/10/2007   HEMORRHOIDS, INTERNAL 05/10/2007   HIP PAIN 07/20/2008   HYPERLIPIDEMIA 07/26/2009   HYPERTENSION 12/26/2006   HYPOKALEMIA 11/25/2007   HYPOTHYROIDISM 12/26/2006   IBS 11/19/2006   INSOMNIA, PERSISTENT 12/26/2006   KELOID 07/25/2007   MURMUR 07/25/2007   Nausea alone 03/23/2010   PARESTHESIA 04/06/2008   Personal history of radiation therapy    RENAL CYST, LEFT 02/23/2010   Rheumatoid arthritis(714.0) 11/19/2006   Dr Mardene Sayer   RUQ PAIN 11/07/2009   Tubular adenoma    VISION IMPAIRMENT, LOW VISION, ONE  EYE-LEFT 07/25/2007   Past Surgical History:  Procedure Laterality Date   BREAST LUMPECTOMY Left 2009   BREAST SURGERY  2009   Left lumpectomy   COLONOSCOPY  06/12/2012   Cyst excision from chest     knee surgery Left    Left thyroidectomy     OS Cataract extraction  2009   OVARIAN CYST REMOVAL  age 27   SHOULDER SURGERY     both shoulders   TOE SURGERY     TUBAL LIGATION     Family History  Problem Relation Age of Onset   Lung cancer Brother    Colon cancer Brother 76       54's   Diabetes Father    Brain cancer Brother    Stroke Sister    Breast cancer Neg Hx    Social History  Socioeconomic History   Marital status: Widowed    Spouse name: Not on file   Number of children: 5   Years of education: Not on file   Highest education level: Not on file  Occupational History   Occupation: retired    Associate Professor: RETIRED  Tobacco Use   Smoking status: Never   Smokeless tobacco: Never  Vaping Use   Vaping status: Never Used  Substance and Sexual Activity   Alcohol use: No    Alcohol/week: 0.0 standard drinks of alcohol   Drug use: No   Sexual activity: Not Currently    Comment: 1st intercourse 83 yo-Fewer than 5 partners  Other Topics Concern   Not on file  Social History Narrative   Patient does not get regular exercise      Daughter lives with her.   Social Drivers of Corporate investment banker Strain: Low Risk  (03/14/2023)   Overall Financial Resource Strain (CARDIA)    Difficulty of Paying Living Expenses: Not very hard  Food Insecurity: No Food Insecurity (03/14/2023)   Hunger Vital Sign    Worried About Running Out of Food in the Last Year: Never true    Ran Out of Food in the Last Year: Never true  Transportation Needs: No Transportation Needs (03/14/2023)   PRAPARE - Administrator, Civil Service (Medical): No    Lack of Transportation (Non-Medical): No  Physical Activity: Insufficiently Active (03/14/2023)   Exercise Vital Sign    Days of  Exercise per Week: 3 days    Minutes of Exercise per Session: 20 min  Stress: No Stress Concern Present (03/14/2023)   [employer] of Occupational Health - Occupational Stress Questionnaire    Feeling of Stress : Not at all  Social Connections: Socially Isolated (03/14/2023)   Social Connection and Isolation Panel [NHANES]    Frequency of Communication with Friends and Family: More than three times a week    Frequency of Social Gatherings with Friends and Family: Patient declined    Attends Religious Services: Never    Database administrator or Organizations: No    Attends Banker Meetings: Never    Marital Status: Widowed    Tobacco Counseling Counseling given: Not Answered    Clinical Intake:  Pre-visit preparation completed: Yes  Pain : 0-10 Pain Score: 8  Pain Type: Chronic pain Pain Location: Back Pain Descriptors / Indicators: Aching, Discomfort Pain Onset: More than a month ago Pain Frequency: Constant Pain Relieving Factors: Tylenol per pt-no relief from taking it. Effect of Pain on Daily Activities: Standing up and when she gets up in the morning  Pain Relieving Factors: Tylenol per pt-no relief from taking it.  BMI - recorded: 30.65 Nutritional Status: BMI > 30  Obese Nutritional Risks: None Diabetes: Yes CBG done?: Yes (110-per pt) CBG resulted in Enter/ Edit results?: No Did pt. bring in CBG monitor from home?: No  How often do you need to have someone help you when you read instructions, pamphlets, or other written materials from your doctor or pharmacy?: 1 - Never  Interpreter Needed?: No  Information entered by :: Osiah Haring, RMA   Activities of Daily Living     03/14/2023    1:35 PM  In your present state of health, do you have any difficulty performing the following activities:  Hearing? 0  Vision? 0  Walking or climbing stairs? 0  Dressing or bathing? 0  Doing errands, shopping? 0  Preparing Food  and eating ? N  Using the  Toilet? N  In the past six months, have you accidently leaked urine? N  Do you have problems with loss of bowel control? N  Managing your Medications? N  Managing your Finances? N  Housekeeping or managing your Housekeeping? N    Patient Care Team: Plotnikov, Georgina Quint, MD as PCP - General (Internal Medicine) Donnetta Hail, MD (Rheumatology) Nadara Mustard, MD (Orthopedic Surgery) Windy Carina, PA-C (Inactive) (Rheumatology) Szabat, Vinnie Level, [facility] (Inactive) as Pharmacist (Pharmacist) [facility] as Consulting Physician (Ophthalmology)  Indicate any recent Medical Services you may have received from other than [facility] providers in the past year (date may be approximate).     Assessment:   This is a routine wellness examination for Shamell.  Hearing/Vision screen Hearing Screening - Comments:: Denies hearing difficulties     Goals Addressed   None    Depression Screen     03/14/2023    1:54 PM 12/20/2022    8:54 AM 09/20/2022    9:06 AM 08/13/2022    2:19 PM 05/24/2022    9:04 AM 03/13/2022    2:10 PM 03/05/2022    8:24 AM  PHQ 2/9 Scores  PHQ - 2 Score 0 0 0 0 0 0 0  PHQ- 9 Score 1     0     Fall Risk     03/14/2023    1:35 PM 12/20/2022    8:54 AM 09/20/2022    9:05 AM 08/13/2022    2:18 PM 05/24/2022    9:04 AM  Fall Risk   Falls in the past year? 1 0 0 0 0  Number falls in past yr: 0 0 0 0 0  Injury with Fall? 0 0 0 0 0  Risk for fall due to :  No Fall Risks No Fall Risks No Fall Risks No Fall Risks  Follow up Falls evaluation completed;Falls prevention discussed Falls evaluation completed Falls evaluation completed Falls evaluation completed Falls evaluation completed    MEDICARE RISK AT HOME:  Medicare Risk at Home Any stairs in or around the home?: (Patient-Rptd) No If so, are there any without handrails?: (Patient-Rptd) No Home free of loose throw rugs in walkways, pet beds, electrical cords, etc?: (Patient-Rptd) Yes Adequate lighting in your  home to reduce risk of falls?: (Patient-Rptd) Yes Life alert?: (Patient-Rptd) No Use of a cane, walker or w/c?: (Patient-Rptd) No Grab bars in the bathroom?: (Patient-Rptd) Yes Shower chair or bench in shower?: (Patient-Rptd) No Elevated toilet seat or a handicapped toilet?: (Patient-Rptd) Yes  TIMED UP AND GO:  Was the test performed?  No  Cognitive Function: 6CIT completed    08/23/2017   11:04 AM 08/22/2016    9:31 AM  MMSE - Mini Mental State Exam  Orientation to time 5 5  Orientation to Place 5 5  Registration 3 3  Attention/ Calculation 5 4  Recall 3 2  Language- name 2 objects 2 2  Language- repeat 1 1  Language- follow 3 step command 3 3  Language- read & follow direction 1 1  Write a sentence 1 1  Copy design 1 1  Total score 30 28        03/14/2023    1:49 PM 03/13/2022    2:10 PM 09/08/2018    9:49 AM  6CIT Screen  What Year? 0 points 0 points 0 points  What month? 0 points 0 points 0 points  What time? 0  points 0 points 0 points  Count back from 20 0 points 0 points 0 points  Months in reverse 0 points 0 points 0 points  Repeat phrase 0 points 0 points 0 points  Total Score 0 points 0 points 0 points    Immunizations Immunization History  Administered Date(s) Administered   Fluad Quad(high Dose 65+) 09/08/2018, 11/05/2019, 10/11/2020, 09/28/2021   Fluad Trivalent(High Dose 65+) 12/20/2022   Influenza Split 10/30/2010, 01/16/2012   Influenza Whole 03/03/2009   Influenza, High Dose Seasonal PF 12/06/2014, 10/04/2015, 11/23/2016, 10/11/2017   Influenza,inj,Quad PF,6+ Mos 09/24/2012, 01/04/2014   PFIZER(Purple Top)SARS-COV-2 Vaccination 03/19/2019, 04/13/2019, 10/20/2019   PNEUMOCOCCAL CONJUGATE-20 08/17/2020   Pneumococcal Conjugate-13 04/05/2014   Pneumococcal Polysaccharide-23 12/06/2014   Td 04/05/2014   Zoster Recombinant(Shingrix) 04/29/2021, 07/21/2021    Screening Tests Health Maintenance  Topic Date Due   COVID-19 Vaccine (4 - 2024-25  season) 09/09/2022   OPHTHALMOLOGY EXAM  10/21/2022   FOOT EXAM  05/14/2023   Diabetic kidney evaluation - Urine ACR  05/24/2023   Diabetic kidney evaluation - eGFR measurement  12/20/2023   Medicare Annual Wellness (AWV)  03/13/2024   DTaP/Tdap/Td (2 - Tdap) 04/04/2024   Pneumonia Vaccine 57+ Years old  Completed   INFLUENZA VACCINE  Completed   DEXA SCAN  Completed   Zoster Vaccines- Shingrix  Completed   HPV VACCINES  Aged Out    Health Maintenance  Health Maintenance Due  Topic Date Due   COVID-19 Vaccine (4 - 2024-25 season) 09/09/2022   OPHTHALMOLOGY EXAM  10/21/2022   Health Maintenance Items Addressed: DEXA ordered, See Nurse Notes  Additional Screening:  Vision Screening: Recommended annual ophthalmology exams for early detection of glaucoma and other disorders of the eye.  Dental Screening: Recommended annual dental exams for proper oral hygiene  Community Resource Referral / Chronic Care Management: CRR required this visit?  No   CCM required this visit?  No     Plan:     I have personally reviewed and noted the following in the patient's chart:   Medical and social history Use of alcohol, tobacco or illicit drugs  Current medications and supplements including opioid prescriptions. Patient is not currently taking opioid prescriptions. Functional ability and status Nutritional status Physical activity Advanced directives List of other physicians Hospitalizations, surgeries, and ER visits in previous 12 months Vitals Screenings to include cognitive, depression, and falls Referrals and appointments  In addition, I have reviewed and discussed with patient certain preventive protocols, quality metrics, and best practice recommendations. A written personalized care plan for preventive services as well as general preventive health recommendations were provided to patient.     Syona Wroblewski L Dessa Ledee, CMA   03/14/2023   After Visit Summary: (MyChart) Due to this  being a telephonic visit, the after visit summary with patients personalized plan was offered to patient via MyChart   Notes: Please refer to Routing Comments.  Medical screening examination/treatment/procedure(s) were performed by non-physician practitioner and as supervising physician I was immediately available for consultation/collaboration.  I agree with above. Jacinta Shoe, MD

## 2023-03-15 DIAGNOSIS — H0288A Meibomian gland dysfunction right eye, upper and lower eyelids: Secondary | ICD-10-CM | POA: Diagnosis not present

## 2023-03-15 DIAGNOSIS — H1045 Other chronic allergic conjunctivitis: Secondary | ICD-10-CM | POA: Diagnosis not present

## 2023-03-15 DIAGNOSIS — H209 Unspecified iridocyclitis: Secondary | ICD-10-CM | POA: Diagnosis not present

## 2023-03-15 DIAGNOSIS — E119 Type 2 diabetes mellitus without complications: Secondary | ICD-10-CM | POA: Diagnosis not present

## 2023-03-15 DIAGNOSIS — Z961 Presence of intraocular lens: Secondary | ICD-10-CM | POA: Diagnosis not present

## 2023-03-15 DIAGNOSIS — H401122 Primary open-angle glaucoma, left eye, moderate stage: Secondary | ICD-10-CM | POA: Diagnosis not present

## 2023-03-15 DIAGNOSIS — H401111 Primary open-angle glaucoma, right eye, mild stage: Secondary | ICD-10-CM | POA: Diagnosis not present

## 2023-03-15 DIAGNOSIS — H0288B Meibomian gland dysfunction left eye, upper and lower eyelids: Secondary | ICD-10-CM | POA: Diagnosis not present

## 2023-03-20 ENCOUNTER — Encounter: Payer: Self-pay | Admitting: Internal Medicine

## 2023-03-20 ENCOUNTER — Ambulatory Visit (INDEPENDENT_AMBULATORY_CARE_PROVIDER_SITE_OTHER): Payer: PPO | Admitting: Internal Medicine

## 2023-03-20 VITALS — BP 118/68 | HR 60 | Temp 98.6°F | Ht 63.0 in | Wt 177.0 lb

## 2023-03-20 DIAGNOSIS — M545 Low back pain, unspecified: Secondary | ICD-10-CM

## 2023-03-20 DIAGNOSIS — N183 Chronic kidney disease, stage 3 unspecified: Secondary | ICD-10-CM | POA: Diagnosis not present

## 2023-03-20 DIAGNOSIS — E119 Type 2 diabetes mellitus without complications: Secondary | ICD-10-CM | POA: Diagnosis not present

## 2023-03-20 DIAGNOSIS — M06032 Rheumatoid arthritis without rheumatoid factor, left wrist: Secondary | ICD-10-CM | POA: Diagnosis not present

## 2023-03-20 DIAGNOSIS — Z7984 Long term (current) use of oral hypoglycemic drugs: Secondary | ICD-10-CM | POA: Diagnosis not present

## 2023-03-20 DIAGNOSIS — M06031 Rheumatoid arthritis without rheumatoid factor, right wrist: Secondary | ICD-10-CM | POA: Diagnosis not present

## 2023-03-20 LAB — URINALYSIS
Bilirubin Urine: NEGATIVE
Hgb urine dipstick: NEGATIVE
Ketones, ur: NEGATIVE
Leukocytes,Ua: NEGATIVE
Nitrite: NEGATIVE
Specific Gravity, Urine: <=1.005 — AB (ref 1.000–1.030)
Total Protein, Urine: NEGATIVE
Urine Glucose: NEGATIVE
Urobilinogen, UA: 0.2 (ref 0.0–1.0)
pH: 6.5 (ref 5.0–8.0)

## 2023-03-20 LAB — COMPREHENSIVE METABOLIC PANEL
ALT: 14 U/L (ref 0–35)
AST: 22 U/L (ref 0–37)
Albumin: 4.2 g/dL (ref 3.5–5.2)
Alkaline Phosphatase: 78 U/L (ref 39–117)
BUN: 14 mg/dL (ref 6–23)
CO2: 31 meq/L (ref 19–32)
Calcium: 10 mg/dL (ref 8.4–10.5)
Chloride: 104 meq/L (ref 96–112)
Creatinine, Ser: 0.99 mg/dL (ref 0.40–1.20)
GFR: 52.71 mL/min — ABNORMAL LOW (ref >60.00)
Glucose, Bld: 96 mg/dL (ref 70–99)
Potassium: 4.2 meq/L (ref 3.5–5.1)
Sodium: 142 meq/L (ref 135–145)
Total Bilirubin: 0.4 mg/dL (ref 0.2–1.2)
Total Protein: 6.7 g/dL (ref 6.0–8.3)

## 2023-03-20 LAB — CBC WITH DIFFERENTIAL/PLATELET
Basophils Absolute: 0.1 10*3/uL (ref 0.0–0.1)
Basophils Relative: 1.5 % (ref 0.0–3.0)
Eosinophils Absolute: 0.2 10*3/uL (ref 0.0–0.7)
Eosinophils Relative: 5.4 % — ABNORMAL HIGH (ref 0.0–5.0)
HCT: 34 % — ABNORMAL LOW (ref 36.0–46.0)
Hemoglobin: 11.2 g/dL — ABNORMAL LOW (ref 12.0–15.0)
Lymphocytes Relative: 35.7 % (ref 12.0–46.0)
Lymphs Abs: 1.5 10*3/uL (ref 0.7–4.0)
MCHC: 32.8 g/dL (ref 30.0–36.0)
MCV: 96.5 fl (ref 78.0–100.0)
Monocytes Absolute: 0.4 10*3/uL (ref 0.1–1.0)
Monocytes Relative: 10.3 % (ref 3.0–12.0)
Neutro Abs: 2 10*3/uL (ref 1.4–7.7)
Neutrophils Relative %: 47.1 % (ref 43.0–77.0)
Platelets: 207 10*3/uL (ref 150.0–400.0)
RBC: 3.53 Mil/uL — ABNORMAL LOW (ref 3.87–5.11)
RDW: 14.9 % (ref 11.5–15.5)
WBC: 4.3 10*3/uL (ref 4.0–10.5)

## 2023-03-20 LAB — HEMOGLOBIN A1C: Hgb A1c MFr Bld: 5.8 % (ref 4.6–6.5)

## 2023-03-20 MED ORDER — METHOCARBAMOL 500 MG PO TABS: 500.0000 mg | ORAL_TABLET | Freq: Three times a day (TID) | ORAL | 3 refills | Status: DC | PRN

## 2023-03-20 NOTE — Assessment & Plan Note (Signed)
On MTX

## 2023-03-20 NOTE — Assessment & Plan Note (Signed)
 Monitoring GFR

## 2023-03-20 NOTE — Assessment & Plan Note (Addendum)
 OA/RA Worse Tylenol is not working Opioid intolerant Robaxin trial Pt declined Pain Clinic

## 2023-03-20 NOTE — Progress Notes (Signed)
 Subjective:  Patient ID: Deanna Salazar, female    DOB: August 18, 1940  Age: 83 y.o. MRN: [id number]  CC: Medical Management of Chronic Issues (3 mnth f/u, discuss if she can take magnesium for constipation due to medications is already on.)   HPI [name] presents for HTN, LBP - worse w/bending, standing, sitting  Outpatient Medications Prior to Visit  Medication Sig Dispense Refill   acetaminophen (TYLENOL) 500 MG tablet Take 500-1,000 mg by mouth every 6 (six) hours as needed for mild pain, moderate pain, fever or headache.     albuterol (VENTOLIN HFA) 108 (90 Base) MCG/ACT inhaler Inhale 2 puffs into the lungs every 6 (six) hours as needed for wheezing or shortness of breath. 18 g 0   amLODipine (NORVASC) 10 MG tablet TAKE 1 TABLET BY MOUTH EVERY DAY 90 tablet 1   aspirin (ASPIRIN CHILDRENS) 81 MG chewable tablet Chew 1 tablet (81 mg total) by mouth daily. 36 tablet 11   Blood Glucose Monitoring Suppl (ONE TOUCH ULTRA 2) w/Device KIT Use to check blood sugars daily 1 kit 0   brimonidine (ALPHAGAN) 0.2 % ophthalmic solution Place 1 drop into both eyes in the morning and at bedtime.     carvedilol (COREG) 25 MG tablet TAKE 1 TABLET(25 MG) BY MOUTH TWICE DAILY WITH A MEAL 180 tablet 1   cholecalciferol (VITAMIN D) 1000 units tablet Take 1,000 Units by mouth daily.     famotidine (PEPCID) 40 MG tablet TAKE 1 TABLET BY MOUTH EVERY DAY 30 tablet 5   fluticasone (FLONASE) 50 MCG/ACT nasal spray Place 2 sprays into both nostrils daily. 16 g 6   folic acid (FOLVITE) 1 MG tablet Take 1 tablet daily     furosemide (LASIX) 20 MG tablet TAKE 1 TO 2 TABLETS BY MOUTH DAILY AS NEEDED FOR SWELLING 60 tablet 11   Lancets (ONETOUCH DELICA PLUS LANCET33G) MISC USE TO TEST BLOOD SUGAR LEVELS 100 each 2   latanoprost (XALATAN) 0.005 % ophthalmic solution Place 1 drop into both eyes at bedtime.  3   levothyroxine (SYNTHROID) 25 MCG tablet TAKE 1 TABLET BY MOUTH EVERY DAY BEFORE BREAKFAST 90 tablet 1    lidocaine 4 % Place 1 patch onto the skin daily. 12 hours on and 12 hours off 15 patch 0   metFORMIN (GLUCOPHAGE) 500 MG tablet TAKE 1 TABLET BY MOUTH TWICE A DAY WITH FOOD 180 tablet 1   methotrexate 2.5 MG tablet Take 20 mg by mouth once a week.     Multiple Vitamins-Minerals (PRESERVISION AREDS 2+MULTI VIT PO) Take 1 capsule by mouth in the morning and at bedtime.     ONETOUCH ULTRA test strip TEST GLUCOSE LEVELS ONCE DAILY 100 strip 3   Polyethyl Glycol-Propyl Glycol (SYSTANE OP) Apply 1 drop to eye as needed.     Polyethylene Glycol 3350 (MIRALAX PO) Take by mouth.     triamcinolone cream (KENALOG) 0.1 % Apply 1 Application topically 4 (four) times daily. 80 g 3   zolpidem (AMBIEN) 10 MG tablet TAKE 10 MG BY MOUTH AT BEDTIME AS NEEDED. 30 tablet 5   cyclobenzaprine (FLEXERIL) 10 MG tablet TAKE 1 TABLET BY MOUTH AT BEDTIME AS NEEDED FOR MUSCLE SPASMS (Patient not taking: Reported on 03/20/2023) 30 tablet 3   methylPREDNISolone (MEDROL DOSEPAK) 4 MG TBPK tablet As directed (Patient not taking: Reported on 03/20/2023) 21 tablet 0   tiZANidine (ZANAFLEX) 4 MG tablet Take 1 tablet (4 mg total) by mouth every 6 (six) hours as  needed for muscle spasms. (Patient not taking: Reported on 03/20/2023) 30 tablet 0   No facility-administered medications prior to visit.    ROS: Review of Systems  Constitutional:  Positive for fatigue. Negative for activity change, appetite change, chills and unexpected weight change.  HENT:  Negative for congestion, mouth sores and sinus pressure.   Eyes:  Negative for visual disturbance.  Respiratory:  Negative for cough and chest tightness.   Gastrointestinal:  Negative for abdominal pain and nausea.  Genitourinary:  Negative for difficulty urinating, frequency and vaginal pain.  Musculoskeletal:  Positive for back pain and gait problem.  Skin:  Negative for pallor and rash.  Neurological:  Negative for dizziness, tremors, weakness, numbness and headaches.   Psychiatric/Behavioral:  Positive for sleep disturbance. Negative for confusion and suicidal ideas.     Objective:  BP 118/68   Pulse 60   Temp 98.6 F (37 C) (Oral)   Ht 5\' 3"  (1.6 m)   Wt 177 lb (80.3 kg)   SpO2 99%   BMI 31.35 kg/m   BP Readings from Last 3 Encounters:  03/20/23 118/68  01/04/23 120/78  01/03/23 (!) 177/87    Wt Readings from Last 3 Encounters:  03/20/23 177 lb (80.3 kg)  03/14/23 173 lb (78.5 kg)  01/04/23 175 lb 3.2 oz (79.5 kg)    Physical Exam Constitutional:      General: She is not in acute distress.    Appearance: She is well-developed. She is obese.  HENT:     Head: Normocephalic.     Right Ear: External ear normal.     Left Ear: External ear normal.     Nose: Nose normal.  Eyes:     General:        Right eye: No discharge.        Left eye: No discharge.     Conjunctiva/sclera: Conjunctivae normal.     Pupils: Pupils are equal, round, and reactive to light.  Neck:     Thyroid: No thyromegaly.     Vascular: No JVD.     Trachea: No tracheal deviation.  Cardiovascular:     Rate and Rhythm: Normal rate and regular rhythm.     Heart sounds: Normal heart sounds.  Pulmonary:     Effort: No respiratory distress.     Breath sounds: No stridor. No wheezing.  Abdominal:     General: Bowel sounds are normal. There is no distension.     Palpations: Abdomen is soft. There is no mass.     Tenderness: There is no abdominal tenderness. There is no guarding or rebound.  Musculoskeletal:        General: Tenderness present.     Cervical back: Normal range of motion and neck supple. No rigidity.  Lymphadenopathy:     Cervical: No cervical adenopathy.  Skin:    Findings: No erythema or rash.  Neurological:     Mental Status: She is oriented to person, place, and time.     Cranial Nerves: No cranial nerve deficit.     Motor: No abnormal muscle tone.     Coordination: Coordination normal.     Gait: Gait abnormal.     Deep Tendon Reflexes:  Reflexes normal.  Psychiatric:        Behavior: Behavior normal.        Thought Content: Thought content normal.        Judgment: Judgment normal.    LS spine w/pain on ROM   Lab Results  Component Value Date   WBC 4.7 12/20/2022   HGB 11.5 (L) 12/20/2022   HCT 35.1 (L) 12/20/2022   PLT 239.0 12/20/2022   GLUCOSE 95 12/20/2022   CHOL 187 08/17/2020   TRIG 132.0 08/17/2020   HDL 46.00 08/17/2020   LDLDIRECT 180.9 07/26/2009   LDLCALC 114 (H) 08/17/2020   ALT 10 12/20/2022   AST 19 12/20/2022   NA 137 12/20/2022   K 4.6 12/20/2022   CL 102 12/20/2022   CREATININE 1.11 12/20/2022   BUN 18 12/20/2022   CO2 29 12/20/2022   TSH 2.46 08/23/2022   HGBA1C 5.9 12/20/2022   MICROALBUR <0.7 05/24/2022    DG Chest 2 View Result Date: 01/03/2023 CLINICAL DATA:  Productive cough. EXAM: CHEST - 2 VIEW COMPARISON:  X-ray 08/23/2022 and older. FINDINGS: Normal cardiopericardial silhouette. Tortuous ectatic aorta. No consolidation, pneumothorax. Slight blunting of the posterior costophrenic angles. Trace pleural effusions are possible. No edema. Degenerative changes are seen of the spine and shoulders. Elevated left humeral head as well. Please correlate for rotator cuff tear. IMPRESSION: Slight blunting of the posterior costophrenic angles. Trace pleural fluid is possible. No consolidation. Electronically Signed   By: Karen Kays M.D.   On: 01/03/2023 14:40    Assessment & Plan:   Problem List Items Addressed This Visit     BACK PAIN - Primary   OA/RA Worse Tylenol is not working Opioid intolerant Robaxin trial Pt declined [facility]      Relevant Medications   methocarbamol (ROBAXIN) 500 MG tablet   CRF (chronic renal failure), stage 3 (moderate) (HCC)   Monitoring GFR      Relevant Orders   Comprehensive metabolic panel   CBC with Differential/Platelet   Hemoglobin A1c   Urinalysis   Diabetes type 2, controlled (HCC)   On Metformin      Relevant Orders    Hemoglobin A1c   Rheumatoid arthritis (HCC)   On MTX       Relevant Medications   methocarbamol (ROBAXIN) 500 MG tablet   Other Relevant Orders   Comprehensive metabolic panel   CBC with Differential/Platelet   Hemoglobin A1c   Urinalysis      Meds ordered this encounter  Medications   methocarbamol (ROBAXIN) 500 MG tablet    Sig: Take 1 tablet (500 mg total) by mouth every 8 (eight) hours as needed for muscle spasms (back pain).    Dispense:  90 tablet    Refill:  3      Follow-up: Return in about 3 months (around 06/20/2023) for a follow-up visit.  Sonda Primes, MD

## 2023-03-20 NOTE — Assessment & Plan Note (Signed)
 On Metformin

## 2023-03-22 ENCOUNTER — Encounter: Payer: Self-pay | Admitting: Internal Medicine

## 2023-03-25 ENCOUNTER — Ambulatory Visit (INDEPENDENT_AMBULATORY_CARE_PROVIDER_SITE_OTHER): Payer: PPO | Admitting: Podiatry

## 2023-03-25 ENCOUNTER — Encounter: Payer: Self-pay | Admitting: Podiatry

## 2023-03-25 DIAGNOSIS — M79674 Pain in right toe(s): Secondary | ICD-10-CM | POA: Diagnosis not present

## 2023-03-25 DIAGNOSIS — M79675 Pain in left toe(s): Secondary | ICD-10-CM | POA: Diagnosis not present

## 2023-03-25 DIAGNOSIS — M2041 Other hammer toe(s) (acquired), right foot: Secondary | ICD-10-CM

## 2023-03-25 DIAGNOSIS — M2042 Other hammer toe(s) (acquired), left foot: Secondary | ICD-10-CM

## 2023-03-25 DIAGNOSIS — L853 Xerosis cutis: Secondary | ICD-10-CM | POA: Diagnosis not present

## 2023-03-25 DIAGNOSIS — B351 Tinea unguium: Secondary | ICD-10-CM

## 2023-03-25 MED ORDER — CLOTRIMAZOLE-BETAMETHASONE 1-0.05 % EX CREA: 1.0000 | TOPICAL_CREAM | Freq: Every day | CUTANEOUS | 0 refills | Status: AC

## 2023-03-25 NOTE — Progress Notes (Unsigned)
 Toe cap left 3rd toe Sto p topicak fo rnow Left foot dry- lot

## 2023-03-28 DIAGNOSIS — M545 Low back pain, unspecified: Secondary | ICD-10-CM | POA: Diagnosis not present

## 2023-03-28 DIAGNOSIS — M25561 Pain in right knee: Secondary | ICD-10-CM | POA: Diagnosis not present

## 2023-03-28 DIAGNOSIS — Z6830 Body mass index (BMI) 30.0-30.9, adult: Secondary | ICD-10-CM | POA: Diagnosis not present

## 2023-03-28 DIAGNOSIS — M0609 Rheumatoid arthritis without rheumatoid factor, multiple sites: Secondary | ICD-10-CM | POA: Diagnosis not present

## 2023-03-28 DIAGNOSIS — M1711 Unilateral primary osteoarthritis, right knee: Secondary | ICD-10-CM | POA: Diagnosis not present

## 2023-03-28 DIAGNOSIS — E669 Obesity, unspecified: Secondary | ICD-10-CM | POA: Diagnosis not present

## 2023-04-05 ENCOUNTER — Encounter: Payer: Self-pay | Admitting: Internal Medicine

## 2023-04-08 ENCOUNTER — Other Ambulatory Visit: Payer: Self-pay | Admitting: Podiatry

## 2023-04-08 ENCOUNTER — Telehealth: Payer: Self-pay | Admitting: Podiatry

## 2023-04-08 MED ORDER — KETOCONAZOLE 2 % EX CREA: 1.0000 | TOPICAL_CREAM | Freq: Every day | CUTANEOUS | 0 refills | Status: AC

## 2023-04-08 NOTE — Telephone Encounter (Signed)
 Patient stated medication prescribed caused an allergic reaction,(Clotrimazole Betamethasone) Red spots and swelling on both feet and ankle.

## 2023-05-09 ENCOUNTER — Other Ambulatory Visit: Payer: Self-pay | Admitting: Internal Medicine

## 2023-05-13 ENCOUNTER — Ambulatory Visit: Payer: Self-pay

## 2023-05-13 NOTE — Telephone Encounter (Signed)
 Copied from CRM [id number]. Topic: Clinical - Prescription Issue >> May 13, 2023  9:53 AM Jethro Morrison wrote: Reason for CRM: PT CALLED STATED SHE WNTS TO KNOW IF THE DOCTOR COULD PRESCRIBE HER THIS MEDICI ATION AGAIN SHE IS HAVING SOME ISSUES  meclizine  25MG .

## 2023-05-14 NOTE — Telephone Encounter (Signed)
 Pt states she is having some dizzy spells when she goes from sitting to standing and is wanting to have some meds on hand to better help control the spells. Pt is asking that a rx to refill her meclizine  25MG  be sent in.

## 2023-06-20 ENCOUNTER — Ambulatory Visit (INDEPENDENT_AMBULATORY_CARE_PROVIDER_SITE_OTHER): Admitting: Internal Medicine

## 2023-06-20 ENCOUNTER — Encounter: Payer: Self-pay | Admitting: Internal Medicine

## 2023-06-20 VITALS — BP 122/56 | HR 65 | Temp 97.6°F | Ht 63.0 in | Wt 178.0 lb

## 2023-06-20 DIAGNOSIS — I10 Essential (primary) hypertension: Secondary | ICD-10-CM | POA: Diagnosis not present

## 2023-06-20 DIAGNOSIS — F5101 Primary insomnia: Secondary | ICD-10-CM | POA: Diagnosis not present

## 2023-06-20 DIAGNOSIS — E039 Hypothyroidism, unspecified: Secondary | ICD-10-CM | POA: Diagnosis not present

## 2023-06-20 DIAGNOSIS — F329 Major depressive disorder, single episode, unspecified: Secondary | ICD-10-CM | POA: Diagnosis not present

## 2023-06-20 DIAGNOSIS — E119 Type 2 diabetes mellitus without complications: Secondary | ICD-10-CM | POA: Diagnosis not present

## 2023-06-20 DIAGNOSIS — M06031 Rheumatoid arthritis without rheumatoid factor, right wrist: Secondary | ICD-10-CM | POA: Diagnosis not present

## 2023-06-20 DIAGNOSIS — M06032 Rheumatoid arthritis without rheumatoid factor, left wrist: Secondary | ICD-10-CM

## 2023-06-20 DIAGNOSIS — Z7984 Long term (current) use of oral hypoglycemic drugs: Secondary | ICD-10-CM | POA: Diagnosis not present

## 2023-06-20 LAB — COMPREHENSIVE METABOLIC PANEL WITH GFR
ALT: 11 U/L (ref 0–35)
AST: 18 U/L (ref 0–37)
Albumin: 4 g/dL (ref 3.5–5.2)
Alkaline Phosphatase: 75 U/L (ref 39–117)
BUN: 13 mg/dL (ref 6–23)
CO2: 31 meq/L (ref 19–32)
Calcium: 9.8 mg/dL (ref 8.4–10.5)
Chloride: 104 meq/L (ref 96–112)
Creatinine, Ser: 0.96 mg/dL (ref 0.40–1.20)
GFR: 54.59 mL/min — ABNORMAL LOW (ref >60.00)
Glucose, Bld: 100 mg/dL — ABNORMAL HIGH (ref 70–99)
Potassium: 3.5 meq/L (ref 3.5–5.1)
Sodium: 142 meq/L (ref 135–145)
Total Bilirubin: 0.5 mg/dL (ref 0.2–1.2)
Total Protein: 6.6 g/dL (ref 6.0–8.3)

## 2023-06-20 LAB — CBC WITH DIFFERENTIAL/PLATELET
Basophils Absolute: 0.1 10*3/uL (ref 0.0–0.1)
Basophils Relative: 1.1 % (ref 0.0–3.0)
Eosinophils Absolute: 0.3 10*3/uL (ref 0.0–0.7)
Eosinophils Relative: 5.2 % — ABNORMAL HIGH (ref 0.0–5.0)
HCT: 33.3 % — ABNORMAL LOW (ref 36.0–46.0)
Hemoglobin: 11 g/dL — ABNORMAL LOW (ref 12.0–15.0)
Lymphocytes Relative: 26.2 % (ref 12.0–46.0)
Lymphs Abs: 1.3 10*3/uL (ref 0.7–4.0)
MCHC: 33 g/dL (ref 30.0–36.0)
MCV: 93.8 fl (ref 78.0–100.0)
Monocytes Absolute: 0.5 10*3/uL (ref 0.1–1.0)
Monocytes Relative: 10.2 % (ref 3.0–12.0)
Neutro Abs: 2.9 10*3/uL (ref 1.4–7.7)
Neutrophils Relative %: 57.3 % (ref 43.0–77.0)
Platelets: 200 10*3/uL (ref 150.0–400.0)
RBC: 3.55 Mil/uL — ABNORMAL LOW (ref 3.87–5.11)
RDW: 14.3 % (ref 11.5–15.5)
WBC: 5.1 10*3/uL (ref 4.0–10.5)

## 2023-06-20 LAB — HEMOGLOBIN A1C: Hgb A1c MFr Bld: 5.7 % (ref 4.6–6.5)

## 2023-06-20 MED ORDER — MECLIZINE HCL 12.5 MG PO TABS
12.5000 mg | ORAL_TABLET | Freq: Three times a day (TID) | ORAL | 1 refills | Status: AC | PRN
Start: 2023-06-20 — End: 2024-06-19

## 2023-06-20 NOTE — Assessment & Plan Note (Signed)
 On Methotrexate 

## 2023-06-20 NOTE — Assessment & Plan Note (Signed)
 Continue on Zolpidem    Potential benefits of a long term benzodiazepines  use as well as potential risks  and complications were explained to the patient and were aknowledged.

## 2023-06-20 NOTE — Progress Notes (Signed)
 Subjective:  Patient ID: Deanna Salazar, female    DOB: May 18, 1940  Age: 83 y.o. MRN: [id number]  CC: Medical Management of Chronic Issues (2 month f/u)   HPI [facility] presents for LBP - chronic F/u HTN, insomnia C/o occ dizziness   Outpatient Medications Prior to Visit  Medication Sig Dispense Refill   acetaminophen  (TYLENOL ) 500 MG tablet Take 500-1,000 mg by mouth every 6 (six) hours as needed for mild pain, moderate pain, fever or headache.     albuterol  (VENTOLIN  HFA) 108 (90 Base) MCG/ACT inhaler Inhale 2 puffs into the lungs every 6 (six) hours as needed for wheezing or shortness of breath. 18 g 0   amLODipine  (NORVASC ) 10 MG tablet TAKE 1 TABLET BY MOUTH EVERY DAY 90 tablet 1   aspirin  (ASPIRIN  CHILDRENS) 81 MG chewable tablet Chew 1 tablet (81 mg total) by mouth daily. 36 tablet 11   Blood Glucose Monitoring Suppl (ONE TOUCH ULTRA 2) w/Device KIT Use to check blood sugars daily 1 kit 0   brimonidine (ALPHAGAN) 0.2 % ophthalmic solution Place 1 drop into both eyes in the morning and at bedtime.     carvedilol  (COREG ) 25 MG tablet TAKE 1 TABLET(25 MG) BY MOUTH TWICE DAILY WITH A MEAL 180 tablet 1   cholecalciferol (VITAMIN D ) 1000 units tablet Take 1,000 Units by mouth daily.     clotrimazole -betamethasone  (LOTRISONE ) cream Apply 1 Application topically daily. 30 g 0   cyclobenzaprine  (FLEXERIL ) 10 MG tablet TAKE 1 TABLET BY MOUTH AT BEDTIME AS NEEDED FOR MUSCLE SPASMS 30 tablet 3   famotidine  (PEPCID ) 40 MG tablet TAKE 1 TABLET BY MOUTH EVERY DAY 30 tablet 5   fluticasone  (FLONASE ) 50 MCG/ACT nasal spray Place 2 sprays into both nostrils daily. 16 g 6   folic acid (FOLVITE) 1 MG tablet Take 1 tablet daily     furosemide  (LASIX ) 20 MG tablet TAKE 1 TO 2 TABLETS BY MOUTH DAILY AS NEEDED FOR SWELLING 60 tablet 11   ketoconazole  (NIZORAL ) 2 % cream Apply 1 Application topically daily. 60 g 0   Lancets (ONETOUCH DELICA PLUS LANCET33G) MISC USE TO TEST BLOOD SUGAR LEVELS 100  each 2   latanoprost (XALATAN) 0.005 % ophthalmic solution Place 1 drop into both eyes at bedtime.  3   levothyroxine  (SYNTHROID ) 25 MCG tablet TAKE 1 TABLET BY MOUTH EVERY DAY BEFORE BREAKFAST 90 tablet 1   lidocaine  4 % Place 1 patch onto the skin daily. 12 hours on and 12 hours off 15 patch 0   metFORMIN  (GLUCOPHAGE ) 500 MG tablet TAKE 1 TABLET BY MOUTH TWICE A DAY WITH FOOD 180 tablet 1   methocarbamol  (ROBAXIN ) 500 MG tablet Take 1 tablet (500 mg total) by mouth every 8 (eight) hours as needed for muscle spasms (back pain). 90 tablet 3   methotrexate  2.5 MG tablet Take 20 mg by mouth once a week.     methylPREDNISolone  (MEDROL  DOSEPAK) 4 MG TBPK tablet As directed 21 tablet 0   Multiple Vitamins-Minerals (PRESERVISION AREDS 2+MULTI VIT PO) Take 1 capsule by mouth in the morning and at bedtime.     ONETOUCH ULTRA test strip TEST GLUCOSE LEVELS ONCE DAILY 100 strip 3   Polyethyl Glycol-Propyl Glycol (SYSTANE OP) Apply 1 drop to eye as needed.     Polyethylene Glycol 3350  (MIRALAX  PO) Take by mouth.     triamcinolone  cream (KENALOG ) 0.1 % Apply 1 Application topically 4 (four) times daily. 80 g 3   zolpidem  (AMBIEN ) 10  MG tablet TAKE 10 MG BY MOUTH AT BEDTIME AS NEEDED. 30 tablet 5   No facility-administered medications prior to visit.    ROS: Review of Systems  Constitutional:  Negative for activity change, appetite change, chills, fatigue and unexpected weight change.  HENT:  Negative for congestion, mouth sores and sinus pressure.   Eyes:  Negative for visual disturbance.  Respiratory:  Negative for cough and chest tightness.   Gastrointestinal:  Negative for abdominal pain and nausea.  Genitourinary:  Negative for difficulty urinating, frequency and vaginal pain.  Musculoskeletal:  Positive for arthralgias, back pain and gait problem.  Skin:  Negative for pallor and rash.  Neurological:  Positive for dizziness. Negative for tremors, weakness, numbness and headaches.   Psychiatric/Behavioral:  Negative for confusion and sleep disturbance. The patient is not nervous/anxious.     Objective:  BP (!) 122/56 (BP Location: Left Arm, Patient Position: Sitting)   Pulse 65   Temp 97.6 F (36.4 C) (Temporal)   Ht 5' 3 (1.6 m)   Wt 178 lb (80.7 kg)   SpO2 98%   BMI 31.53 kg/m   BP Readings from Last 3 Encounters:  06/20/23 (!) 122/56  03/20/23 118/68  01/04/23 120/78    Wt Readings from Last 3 Encounters:  06/20/23 178 lb (80.7 kg)  03/20/23 177 lb (80.3 kg)  03/14/23 173 lb (78.5 kg)    Physical Exam Constitutional:      General: She is not in acute distress.    Appearance: She is well-developed.  HENT:     Head: Normocephalic.     Right Ear: External ear normal.     Left Ear: External ear normal.     Nose: Nose normal.   Eyes:     General:        Right eye: No discharge.        Left eye: No discharge.     Conjunctiva/sclera: Conjunctivae normal.     Pupils: Pupils are equal, round, and reactive to light.   Neck:     Thyroid : No thyromegaly.     Vascular: No JVD.     Trachea: No tracheal deviation.   Cardiovascular:     Rate and Rhythm: Normal rate and regular rhythm.     Heart sounds: Normal heart sounds.  Pulmonary:     Effort: No respiratory distress.     Breath sounds: No stridor. No wheezing.  Abdominal:     General: Bowel sounds are normal. There is no distension.     Palpations: Abdomen is soft. There is no mass.     Tenderness: There is no abdominal tenderness. There is no guarding or rebound.   Musculoskeletal:        General: Tenderness present.     Cervical back: Normal range of motion and neck supple. No rigidity.  Lymphadenopathy:     Cervical: No cervical adenopathy.   Skin:    Findings: No erythema or rash.   Neurological:     Cranial Nerves: No cranial nerve deficit.     Motor: No abnormal muscle tone.     Coordination: Coordination normal.     Deep Tendon Reflexes: Reflexes normal.   Psychiatric:         Behavior: Behavior normal.        Thought Content: Thought content normal.        Judgment: Judgment normal.   Antalgic gait  Lab Results  Component Value Date   WBC 4.3 03/20/2023   HGB 11.2 (L)  03/20/2023   HCT 34.0 (L) 03/20/2023   PLT 207.0 03/20/2023   GLUCOSE 96 03/20/2023   CHOL 187 08/17/2020   TRIG 132.0 08/17/2020   HDL 46.00 08/17/2020   LDLDIRECT 180.9 07/26/2009   LDLCALC 114 (H) 08/17/2020   ALT 14 03/20/2023   AST 22 03/20/2023   NA 142 03/20/2023   K 4.2 03/20/2023   CL 104 03/20/2023   CREATININE 0.99 03/20/2023   BUN 14 03/20/2023   CO2 31 03/20/2023   TSH 2.46 08/23/2022   HGBA1C 5.8 03/20/2023   MICROALBUR <0.7 05/24/2022    DG Chest 2 View Result Date: 01/03/2023 CLINICAL DATA:  Productive cough. EXAM: CHEST - 2 VIEW COMPARISON:  X-ray 08/23/2022 and older. FINDINGS: Normal cardiopericardial silhouette. Tortuous ectatic aorta. No consolidation, pneumothorax. Slight blunting of the posterior costophrenic angles. Trace pleural effusions are possible. No edema. Degenerative changes are seen of the spine and shoulders. Elevated left humeral head as well. Please correlate for rotator cuff tear. IMPRESSION: Slight blunting of the posterior costophrenic angles. Trace pleural fluid is possible. No consolidation. Electronically Signed   By: Adrianna Horde M.D.   On: 01/03/2023 14:40    Assessment & Plan:   Problem List Items Addressed This Visit     Hypothyroidism   Chronic On Levothroid Check TSH      Insomnia   Continue on Zolpidem    Potential benefits of a long term benzodiazepines  use as well as potential risks  and complications were explained to the patient and were aknowledged.       Depression   Pt is not depressed now Pt is waking up at 2-3 am after 4 hrs on Zolpidem       Essential hypertension    Lasix  prn -  use 1/2 tab Pt is getting labs w/Rheumatology every 2 mo (CMET, CBC)      Rheumatoid arthritis (HCC)   On  Methotrexate        Relevant Orders   CBC with Differential/Platelet   Comprehensive metabolic panel with GFR   Diabetes type 2, controlled (HCC) - Primary   On Metformin       Relevant Orders   CBC with Differential/Platelet   Comprehensive metabolic panel with GFR   Hemoglobin A1c   TSH      Meds ordered this encounter  Medications   meclizine  (ANTIVERT ) 12.5 MG tablet    Sig: Take 1 tablet (12.5 mg total) by mouth 3 (three) times daily as needed for dizziness.    Dispense:  30 tablet    Refill:  1      Follow-up: Return in about 3 months (around 09/20/2023) for a follow-up visit.  Anitra Barn, MD

## 2023-06-20 NOTE — Assessment & Plan Note (Signed)
 On Metformin

## 2023-06-20 NOTE — Assessment & Plan Note (Signed)
Lasix prn -  use 1/2 tab Pt is getting labs w/Rheumatology every 2 mo (CMET, CBC)

## 2023-06-20 NOTE — Assessment & Plan Note (Signed)
Chronic On Levothroid Check TSH

## 2023-06-20 NOTE — Assessment & Plan Note (Signed)
Pt is not depressed now  Pt is waking up at 2-3 am after 4 hrs on Zolpidem

## 2023-06-21 ENCOUNTER — Other Ambulatory Visit: Payer: Self-pay | Admitting: Internal Medicine

## 2023-06-21 LAB — TSH: TSH: 1.77 u[IU]/mL (ref 0.35–5.50)

## 2023-06-24 ENCOUNTER — Ambulatory Visit: Payer: Self-pay

## 2023-06-24 NOTE — Telephone Encounter (Signed)
 Patient called in requesting PCP to go over lab results with her. Patient states she is concerned because she doesn't understand any of the abnormal lab results and wants to know if she needs to change any medication. Patient states I am taking two medications that affect my kidneys and I dont know if this lab work shows that. This RN did not reveal and discuss labwork with patient because it has not been reviewed/dictated on by PCP. Please advise and contact patient. *patient states she will be away from home until 1330 today and then can be reached at [phone] after 1330.         Copied from CRM [id number]. Topic: Clinical - Lab/Test Results >> Jun 24, 2023  8:19 AM Bambi Bonine D wrote: Reason for CRM: Pt is requesting to speak with a nurse regarding her recent lab results.   ----------------------------------------------------------------------- From previous Reason for Contact - Home Health Verbal Orders: Caller/Agency:  Callback Number:  Service Requested:   Frequency:  Any new concerns about the patient?

## 2023-06-25 ENCOUNTER — Ambulatory Visit: Admitting: Podiatry

## 2023-06-25 DIAGNOSIS — B351 Tinea unguium: Secondary | ICD-10-CM | POA: Diagnosis not present

## 2023-06-25 DIAGNOSIS — M79675 Pain in left toe(s): Secondary | ICD-10-CM

## 2023-06-25 DIAGNOSIS — M79674 Pain in right toe(s): Secondary | ICD-10-CM | POA: Diagnosis not present

## 2023-06-25 DIAGNOSIS — Q828 Other specified congenital malformations of skin: Secondary | ICD-10-CM

## 2023-06-25 NOTE — Progress Notes (Signed)
 Subjective: Chief Complaint  Patient presents with   RFC    RM#11 RFC patient states dark nails seem to be wanting to come off.    83 year old female presents the office for above concerns.  Her nails are thick and elongated she cannot do them herself and are causing discomfort.  She states that the nails are getting loose.  She has been using topical medication but she has discontinued it as it was not helping and due to the cost.  She denies any swelling or redness or any drainage.  No open lesions.  Last A1c 5.7 on 06/20/2023  Plotnikov, Oakley Bellman, MD Last seen 06/20/2023   Objective: AAO x3, NAD DP/PT pulses palpable bilaterally, CRT less than 3 seconds Hyperkeratotic tissue left foot submetatarsal 4 without any underlying ulceration, drainage or any signs of infection. The toenails continue to be dystrophic with yellow, brown discoloration on the right.  There is still dark discoloration present to the nails on the left foot.  Do not appear to be any worsening and there is no extension into the surrounding skin.  They are uniform in color side.   She gets tenderness nails 1-5 bilaterally. Hammertoes present. Hyperkeratotic lesion noted left foot submetatarsal without any underlying ulceration, drainage or signs of infection.  No pain with calf compression, swelling, warmth, erythema  Assessment: Skin lesion; symptomatic onychomycosis; porkertosis  Plan: Symptomatic diagnosis -Sharply debrided the nails x 10 without any complications or bleeding.  Unlikely the topical medications coming GI upset she is going to open this medication and should her symptoms resolve we will likely switch to different topical.  Will switch to Formula 3.   Porokertosis -Sharply debrided x 1 without any complications or bleeding  Return in about 3 months (around 09/25/2023).  Charity Conch DPM

## 2023-06-30 NOTE — Telephone Encounter (Signed)
 Deanna Salazar (not early next) in the previous message

## 2023-06-30 NOTE — Telephone Encounter (Signed)
 The labs are okay for early next age in the state of health: It is not uncommon to have slightly abnormal labs under the circumstances.  No change in medications.  Hydrate well Thanks

## 2023-07-03 ENCOUNTER — Ambulatory Visit: Payer: Self-pay | Admitting: Internal Medicine

## 2023-07-07 ENCOUNTER — Other Ambulatory Visit: Payer: Self-pay | Admitting: Internal Medicine

## 2023-07-09 DIAGNOSIS — H401111 Primary open-angle glaucoma, right eye, mild stage: Secondary | ICD-10-CM | POA: Diagnosis not present

## 2023-07-09 DIAGNOSIS — H401122 Primary open-angle glaucoma, left eye, moderate stage: Secondary | ICD-10-CM | POA: Diagnosis not present

## 2023-07-19 ENCOUNTER — Ambulatory Visit: Payer: Self-pay

## 2023-07-19 NOTE — Telephone Encounter (Signed)
 Tried to call pt to ask her which medication is she pertaining to as the last med rx'd was Meclizine  which treats dizziness.

## 2023-07-19 NOTE — Telephone Encounter (Signed)
 The only medication I prescribed at the last visit was meclizine  for dizziness.  What medicine is she referring to?  Thank you

## 2023-07-19 NOTE — Telephone Encounter (Signed)
        FYI Only or Action Required?: Action required by provider: clinical question for provider.  Patient was last seen in primary care on 06/20/2023 by Plotnikov, [name], MD.  Called Nurse Triage reporting Back Pain.  Symptoms began several weeks ago.  Interventions attempted: Prescription medications: See note .  Symptoms are: gradually worsening.  Triage Disposition: No disposition on file. See note   Patient/caregiver understands and will follow disposition?:  Answer Assessment - Initial Assessment Questions 1. ONSET: When did the pain begin? (e.g., minutes, hours, days)     ----  quite a while  2. LOCATION: Where does it hurt? (upper, mid or lower back)     ----------Lower   3. SEVERITY: How bad is the pain?  (e.g., Scale 1-10; mild, moderate, or severe)     -----------7-8/10  4. PATTERN: Is the pain constant? (e.g., yes, no; constant, intermittent)      ----------------- Constant   5. RADIATION: Does the pain shoot into your legs or somewhere else?     -----------Denies   6. CAUSE:  What do you think is causing the back pain?      ------ Arthritis of the spine   8. MEDICINES: What have you taken so far for the pain? (e.g., nothing, acetaminophen , NSAIDS)     ------------- Prescribed medication, unsure of the name.    9. NEUROLOGIC SYMPTOMS: Do you have any weakness, numbness, or problems with bowel/bladder control?     -----Denies   10. OTHER SYMPTOMS: Do you have any other symptoms? (e.g., fever, abdomen pain, burning with urination, blood in urine)       ---Denies    Patient is requesting other medication, as the one she was prescribed isn't working for her. Pt was recently seen 06/2023.    Message routed to office.--- please advise.    Patient educated on pertinent s/s that would warrant emergent help/call 911/ ED/UC.  Patient verbalized understanding and agrees with plan No additional questions/concerns noted during the  time of the call.  Protocols used: Back Pain-A-AH

## 2023-08-14 ENCOUNTER — Other Ambulatory Visit: Payer: Self-pay | Admitting: Internal Medicine

## 2023-08-16 ENCOUNTER — Encounter: Payer: Self-pay | Admitting: Internal Medicine

## 2023-09-03 ENCOUNTER — Other Ambulatory Visit: Payer: Self-pay

## 2023-09-03 ENCOUNTER — Ambulatory Visit
Admission: EM | Admit: 2023-09-03 | Discharge: 2023-09-03 | Disposition: A | Discharge status: Home / Self Care | Attending: Nurse Practitioner | Admitting: Nurse Practitioner

## 2023-09-03 ENCOUNTER — Encounter: Payer: Self-pay | Admitting: Emergency Medicine

## 2023-09-03 DIAGNOSIS — R300 Dysuria: Secondary | ICD-10-CM

## 2023-09-03 DIAGNOSIS — U071 COVID-19: Secondary | ICD-10-CM | POA: Diagnosis not present

## 2023-09-03 DIAGNOSIS — R509 Fever, unspecified: Secondary | ICD-10-CM | POA: Diagnosis not present

## 2023-09-03 LAB — POC COVID19/FLU A&B COMBO
Covid Antigen, POC: POSITIVE — AB
Influenza A Antigen, POC: NEGATIVE
Influenza B Antigen, POC: NEGATIVE

## 2023-09-03 LAB — POCT URINE DIPSTICK
Bilirubin, UA: NEGATIVE
Glucose, UA: NEGATIVE mg/dL
Ketones, POC UA: NEGATIVE mg/dL
Leukocytes, UA: NEGATIVE
Nitrite, UA: NEGATIVE
POC PROTEIN,UA: NEGATIVE
Spec Grav, UA: 1.02 (ref 1.010–1.025)
Urobilinogen, UA: 0.2 U/dL
pH, UA: 7 (ref 5.0–8.0)

## 2023-09-03 MED ORDER — ACETAMINOPHEN 325 MG PO TABS
650.0000 mg | ORAL_TABLET | Freq: Once | ORAL | Status: AC
  Administered 2023-09-03: 650 mg via ORAL

## 2023-09-03 MED ORDER — PAXLOVID (150/100) 10 X 150 MG & 10 X 100MG PO TBPK: 2.0000 | ORAL_TABLET | Freq: Two times a day (BID) | ORAL | 0 refills | Status: AC

## 2023-09-03 NOTE — ED Provider Notes (Signed)
 [name]    CSN: [id number] Arrival date & time: 09/03/23  1030      History   Chief Complaint Chief Complaint  Patient presents with   Cough   UTI Symptoms    HPI Deanna Salazar is a 83 y.o. female.   Patient presents for evaluation of a runny nose, sore throat, headache, cough, and generally feeling unwell.  Symptom onset was yesterday evening.  She has not taken any medications for the symptoms.  Patient also endorses some intermittent dysuria that started over the weekend.  No reported fever, abdominal pain, vomiting, or diarrhea.    The history is provided by the patient.  Cough Associated symptoms: fever, headaches, rhinorrhea and sore throat   Associated symptoms: no chest pain, no chills, no myalgias and no rash     Past Medical History:  Diagnosis Date   Abdominal pain, generalized 02/05/2008   ABDOMINAL PAIN-EPIGASTRIC 03/24/2009   Acute bronchitis 02/15/2007   Adenomatous colon polyp 04/2000   Arthritis    ASCUS favor benign 10/2011   negative high risk HPV screen   BACK PAIN 07/20/2008   Breast cancer (HCC) 2009   l, hx of XRT Dr Cloretta, Left side   BREAST CANCER, HX OF 04/16/2007   CHEST PAIN 02/23/2010   COLONIC POLYPS, ADENOMATOUS, HX OF 10/01/2007   CONSTIPATION, CHRONIC 05/10/2007   Dysuria 04/06/2008   FATIGUE 12/26/2006   Fatty liver    FATTY LIVER DISEASE 11/19/2006   FIBROMYALGIA 11/19/2006   GERD 11/19/2006   Dr Aneita   Glaucoma 07/2017   left eye   HEMATOCHEZIA 05/10/2007   HEMORRHOIDS, INTERNAL 05/10/2007   HIP PAIN 07/20/2008   HYPERLIPIDEMIA 07/26/2009   HYPERTENSION 12/26/2006   HYPOKALEMIA 11/25/2007   HYPOTHYROIDISM 12/26/2006   IBS 11/19/2006   INSOMNIA, PERSISTENT 12/26/2006   KELOID 07/25/2007   MURMUR 07/25/2007   Nausea alone 03/23/2010   PARESTHESIA 04/06/2008   Personal history of radiation therapy    RENAL CYST, LEFT 02/23/2010   Rheumatoid arthritis(714.0) 11/19/2006   Dr Starlyn   RUQ PAIN 11/07/2009   Tubular  adenoma    VISION IMPAIRMENT, LOW VISION, ONE EYE-LEFT 07/25/2007    Patient Active Problem List   Diagnosis Date Noted   Anemia 12/20/2022   Adult-onset obesity 03/05/2022   Foot pain, left 10/30/2021   Tongue swelling 09/28/2021   Lip swelling 09/28/2021   Angioedema 09/28/2021   Rash in adult 09/08/2021   Headache 07/27/2021   Weight loss 02/23/2021   CRF (chronic renal failure), stage 3 (moderate) (HCC) 02/25/2020   Nightmares 04/22/2019   Exposure to COVID-19 virus 10/09/2018   Cough 10/09/2018   Grief 11/23/2016   Unilateral primary osteoarthritis, right knee 08/23/2016   Hypokalemia 05/16/2016   Rhonchi 02/09/2016   Intertrigo 10/04/2015   Allergic rhinitis 04/05/2015   Well adult exam 06/28/2014   Tinea pedis 10/13/2013   Near syncope 09/28/2013   Low back pain 09/24/2012   Diabetes type 2, controlled (HCC) 11/06/2011   Breast cancer (HCC) 11/06/2011   URI, acute 03/02/2011   Flank pain 03/02/2011   Hyperglycemia 03/02/2011   Edema 10/30/2010   Dyspnea and respiratory abnormalities 10/30/2010   Cramp in limb 10/30/2010   Bronchitis 10/06/2010   NAUSEA ALONE 03/23/2010   RENAL CYST, LEFT 02/23/2010   Chest pain 02/23/2010   Dyslipidemia 07/26/2009   ABDOMINAL PAIN-EPIGASTRIC 03/24/2009   HIP PAIN 07/20/2008   BACK PAIN 07/20/2008   FEVER, NOS 07/20/2008   PARESTHESIA 04/06/2008  Dysuria 04/06/2008   Dysphagia, pharyngoesophageal phase 02/05/2008   ABDOMINAL PAIN, GENERALIZED 02/05/2008   HYPOKALEMIA 11/25/2007   Depression 11/25/2007   History of colonic polyps 10/01/2007   VISION IMPAIRMENT, LOW VISION, ONE EYE-LEFT 07/25/2007   KELOID 07/25/2007   MURMUR 07/25/2007   HEMORRHOIDS, INTERNAL 05/10/2007   Constipation 05/10/2007   BREAST CANCER, HX OF 04/16/2007   Acute bronchitis 02/15/2007   Hypothyroidism 12/26/2006   Insomnia 12/26/2006   Essential hypertension 12/26/2006   Chronic fatigue 12/26/2006   GERD 11/19/2006   IBS 11/19/2006   FATTY  LIVER DISEASE 11/19/2006   Rheumatoid arthritis (HCC) 11/19/2006   Fibromyalgia 11/19/2006    Past Surgical History:  Procedure Laterality Date   BREAST LUMPECTOMY Left 2009   BREAST SURGERY  2009   Left lumpectomy   COLONOSCOPY  06/12/2012   Cyst excision from chest     knee surgery Left    Left thyroidectomy     OS Cataract extraction  2009   OVARIAN CYST REMOVAL  age 63   SHOULDER SURGERY     both shoulders   TOE SURGERY     TUBAL LIGATION      OB History     Gravida  5   Para  5   Term      Preterm      AB      Living  5      SAB      IAB      Ectopic      Multiple      Live Births               Home Medications    Prior to Admission medications   Medication Sig Start Date End Date Taking? Authorizing Provider  nirmatrelvir/ritonavir, renal dosing, (PAXLOVID , 150/100,) 10 x 150 MG & 10 x 100MG  TBPK Take 2 tablets by mouth 2 (two) times daily for 5 days. 09/03/23 09/08/23 Yes Janet Therisa PARAS, FNP  acetaminophen  (TYLENOL ) 500 MG tablet Take 500-1,000 mg by mouth every 6 (six) hours as needed for mild pain, moderate pain, fever or headache.    [provider]  albuterol  (VENTOLIN  HFA) 108 (90 Base) MCG/ACT inhaler Inhale 2 puffs into the lungs every 6 (six) hours as needed for wheezing or shortness of breath. 08/13/22   Merlynn Niki FALCON, FNP  amLODipine  (NORVASC ) 10 MG tablet TAKE 1 TABLET BY MOUTH EVERY DAY 02/18/23   Plotnikov, Aleksei V, MD  aspirin  (ASPIRIN  CHILDRENS) 81 MG chewable tablet Chew 1 tablet (81 mg total) by mouth daily. 12/06/14   Plotnikov, Aleksei V, MD  Blood Glucose Monitoring Suppl (ONE TOUCH ULTRA 2) w/Device KIT Use to check blood sugars daily 02/05/22   Plotnikov, Aleksei V, MD  brimonidine (ALPHAGAN) 0.2 % ophthalmic solution Place 1 drop into both eyes in the morning and at bedtime. 04/21/19   [provider]  carvedilol  (COREG ) 25 MG tablet TAKE 1 TABLET(25 MG) BY MOUTH TWICE DAILY WITH A MEAL 02/18/23   Plotnikov,  Aleksei V, MD  cholecalciferol (VITAMIN D ) 1000 units tablet Take 1,000 Units by mouth daily.    [provider]  clotrimazole -betamethasone  (LOTRISONE ) cream Apply 1 Application topically daily. 03/25/23   Gershon Donnice SAUNDERS, DPM  cyclobenzaprine  (FLEXERIL ) 10 MG tablet TAKE 1 TABLET BY MOUTH AT BEDTIME AS NEEDED FOR MUSCLE SPASMS 01/14/23   Plotnikov, [name], MD  famotidine  (PEPCID ) 40 MG tablet TAKE 1 TABLET BY MOUTH EVERY DAY 03/21/22   Plotnikov, Aleksei V, MD  fluticasone  (FLONASE ) 50 MCG/ACT nasal spray Place 2 sprays into both nostrils daily. 08/03/22   Blair, Diane W, FNP  folic acid (FOLVITE) 1 MG tablet Take 1 tablet daily    [provider]  furosemide  (LASIX ) 20 MG tablet TAKE 1 TO 2 TABLETS BY MOUTH DAILY AS NEEDED FOR SWELLING 07/08/23   Plotnikov, Aleksei V, MD  ketoconazole  (NIZORAL ) 2 % cream Apply 1 Application topically daily. 04/08/23   Gershon Donnice SAUNDERS, DPM  Lancets (ONETOUCH DELICA PLUS LANCET33G) MISC USE TO TEST BLOOD SUGAR LEVELS 01/22/23   Plotnikov, Aleksei V, MD  latanoprost (XALATAN) 0.005 % ophthalmic solution Place 1 drop into both eyes at bedtime. 07/08/17   [provider]  levothyroxine  (SYNTHROID ) 25 MCG tablet TAKE 1 TABLET BY MOUTH EVERY DAY BEFORE BREAKFAST 05/09/23   Plotnikov, [name], MD  lidocaine  4 % Place 1 patch onto the skin daily. 12 hours on and 12 hours off 09/11/22   Chandra Harlene LABOR, NP  meclizine  (ANTIVERT ) 12.5 MG tablet Take 1 tablet (12.5 mg total) by mouth 3 (three) times daily as needed for dizziness. 06/20/23 06/19/24  Plotnikov, [name], MD  metFORMIN  (GLUCOPHAGE ) 500 MG tablet TAKE 1 TABLET BY MOUTH TWICE A DAY WITH FOOD 06/21/23   Plotnikov, [name], MD  methocarbamol  (ROBAXIN ) 500 MG tablet Take 1 tablet (500 mg total) by mouth every 8 (eight) hours as needed for muscle spasms (back pain). 03/20/23   Plotnikov, Aleksei V, MD  methotrexate  2.5 MG tablet Take 20 mg by mouth once a week. 07/15/19   [provider]  methylPREDNISolone  (MEDROL  DOSEPAK) 4 MG TBPK tablet As directed 08/23/22   Plotnikov, Aleksei V, MD  Multiple Vitamins-Minerals (PRESERVISION AREDS 2+MULTI VIT PO) Take 1 capsule by mouth in the morning and at bedtime.    [provider]  [facility] ULTRA test strip TEST GLUCOSE LEVELS ONCE DAILY 02/08/23   Plotnikov, Aleksei V, MD  Polyethyl Glycol-Propyl Glycol (SYSTANE OP) Apply 1 drop to eye as needed.    [provider]  Polyethylene Glycol 3350  (MIRALAX  PO) Take by mouth.    [provider]  triamcinolone  cream (KENALOG ) 0.1 % Apply 1 Application topically 4 (four) times daily. 09/08/21   Plotnikov, [name], MD  zolpidem  (AMBIEN ) 10 MG tablet TAKE 10 MG BY MOUTH AT BEDTIME AS NEEDED. 08/18/23   Plotnikov, [name], MD    Family History Family History  Problem Relation Age of Onset   Lung cancer Brother    Colon cancer Brother 24       49's   Diabetes Father    Brain cancer Brother    Stroke Sister    Breast cancer Neg Hx     Social History Social History   Tobacco Use   Smoking status: Never   Smokeless tobacco: Never  Vaping Use   Vaping status: Never Used  Substance Use Topics   Alcohol use: No    Alcohol/week: 0.0 standard drinks of alcohol   Drug use: No     Allergies   Codeine , Hydrocodone , Hydrocodone -acetaminophen , Hydroxyzine , Lovastatin , Pantoprazole , Pepcid  [famotidine ], Telmisartan , Tramadol hcl, and Trazodone  and nefazodone   Review of Systems Review of Systems  Constitutional:  Positive for fatigue and fever. Negative for chills.  HENT:  Positive for congestion, rhinorrhea and sore throat.   Eyes:  Negative for pain and redness.  Respiratory:  Positive for cough and chest tightness.   Cardiovascular:  Negative for chest pain and palpitations.  Gastrointestinal:  Negative for  abdominal pain, diarrhea and vomiting.  Genitourinary:  Positive for dysuria. Negative for decreased urine volume and frequency.  Musculoskeletal:   Negative for back pain and myalgias.  Skin:  Negative for rash.  Neurological:  Positive for headaches.  Psychiatric/Behavioral:  Negative for behavioral problems.      Physical Exam Triage Vital Signs ED Triage Vitals [09/03/23 1041]  Encounter Vitals Group     BP (!) 184/84     Girls Systolic BP Percentile      Girls Diastolic BP Percentile      Boys Systolic BP Percentile      Boys Diastolic BP Percentile      Pulse Rate 78     Resp 18     Temp (!) 100.7 F (38.2 C)     Temp Source Oral     SpO2 94 %     Weight      Height      Head Circumference      Peak Flow      Pain Score 4     Pain Loc      Pain Education      Exclude from Growth Chart    No data found.  Updated Vital Signs BP (!) 159/88 (BP Location: Right Arm)   Pulse 78   Temp (!) 100.7 F (38.2 C) (Oral)   Resp 18   SpO2 94%   Visual Acuity Right Eye Distance:   Left Eye Distance:   Bilateral Distance:    Right Eye Near:   Left Eye Near:    Bilateral Near:     Physical Exam Vitals and nursing note reviewed.  Constitutional:      Appearance: Normal appearance. She is ill-appearing. She is not toxic-appearing.  HENT:     Head: Normocephalic.     Right Ear: Tympanic membrane and ear canal normal.     Left Ear: Tympanic membrane and ear canal normal.     Nose: Congestion and rhinorrhea present.     Mouth/Throat:     Mouth: Mucous membranes are moist.     Pharynx: No posterior oropharyngeal erythema.  Eyes:     Conjunctiva/sclera: Conjunctivae normal.     Pupils: Pupils are equal, round, and reactive to light.  Cardiovascular:     Rate and Rhythm: Normal rate and regular rhythm.     Heart sounds: Normal heart sounds.  Pulmonary:     Effort: Pulmonary effort is normal.     Breath sounds: Normal breath sounds.  Abdominal:     General: Bowel sounds are normal.  Musculoskeletal:     Cervical back: Normal range of motion.  Skin:    General: Skin is warm and dry.  Neurological:      General: No focal deficit present.     Mental Status: She is alert and oriented to person, place, and time.  Psychiatric:        Mood and Affect: Mood normal.        Behavior: Behavior normal.        Thought Content: Thought content normal.        Judgment: Judgment normal.     UC Treatments / Results  Labs (all labs ordered are listed, but only abnormal results are displayed) Labs Reviewed  POC COVID19/FLU A&B COMBO - Abnormal; Notable for the following components:      Result Value   Covid Antigen, POC Positive (*)    All other components within normal limits  POCT URINE DIPSTICK - Abnormal;  Notable for the following components:   Blood, UA trace-intact (*)    All other components within normal limits    EKG  Radiology No results found.  Procedures Procedures (including critical care time)  Medications Ordered in UC Medications  acetaminophen  (TYLENOL ) tablet 650 mg (650 mg Oral Given 09/03/23 1045)    Initial Impression / Assessment and Plan / UC Course  I have reviewed the triage vital signs and the nursing notes.  Pertinent labs & imaging results that were available during my care of the patient were reviewed by me and considered in my medical decision making (see chart for details).    Patient presented for evaluation of a URI symptoms and a cough.  Positive POCT COVID testing.  I discussed the use of Paxlovid  with the patient - discussed the risks and benefits of the medication.  Her last GFR on 06/20/23 was 54.59, therefore renal dosing was provided.  Patient was slightly reluctant to take the medication - further information was provided to her for review and the prescription has been sent in the event she wants to take it.  Aware to remain home until symptoms begin to improve and then wear a mask for 5 days afterwards.  Recommended OTC medications such as Tylenol  and Coricidin HBP Cough/Cold for symptom management.  She is aware to hold her Flonase  while taking  Paxlovid .  Her POCT UA was unremarkable for signs of infection.   Final Clinical Impressions(s) / UC Diagnoses   Final diagnoses:  Fever, unspecified  Dysuria  COVID-19     Discharge Instructions      Take the Paxlovid  for the COVID infection. May use Coricidin HBP Cough/Cold to safely manage your symptoms without raising your blood pressure. Tylenol  for pain/fever. Ensure adequate rest and oral hydration. Stay home until you start to feel better - then wear a mask in public for 5 days afterwards.    ED Prescriptions     Medication Sig Dispense Auth. Provider   nirmatrelvir/ritonavir, renal dosing, (PAXLOVID , 150/100,) 10 x 150 MG & 10 x 100MG  TBPK Take 2 tablets by mouth 2 (two) times daily for 5 days. 20 tablet Janet Therisa PARAS, FNP      PDMP not reviewed this encounter.   Janet Therisa PARAS, FNP 09/03/23 [phone]

## 2023-09-03 NOTE — Discharge Instructions (Addendum)
 Take the Paxlovid  for the COVID infection. May use Coricidin HBP Cough/Cold to safely manage your symptoms without raising your blood pressure. Tylenol  for pain/fever. Ensure adequate rest and oral hydration. Stay home until you start to feel better - then wear a mask in public for 5 days afterwards.

## 2023-09-03 NOTE — ED Triage Notes (Signed)
 Pt here for cough and congestion with fever x 3 days; pt sts some sinus pressure and sore throat

## 2023-09-23 ENCOUNTER — Encounter: Payer: Self-pay | Admitting: Internal Medicine

## 2023-09-23 ENCOUNTER — Ambulatory Visit: Admitting: Internal Medicine

## 2023-09-23 ENCOUNTER — Ambulatory Visit (INDEPENDENT_AMBULATORY_CARE_PROVIDER_SITE_OTHER)

## 2023-09-23 VITALS — BP 140/92 | HR 59 | Temp 98.0°F | Ht 63.0 in | Wt 179.2 lb

## 2023-09-23 DIAGNOSIS — R059 Cough, unspecified: Secondary | ICD-10-CM | POA: Diagnosis not present

## 2023-09-23 DIAGNOSIS — I10 Essential (primary) hypertension: Secondary | ICD-10-CM

## 2023-09-23 DIAGNOSIS — R739 Hyperglycemia, unspecified: Secondary | ICD-10-CM

## 2023-09-23 DIAGNOSIS — Z8616 Personal history of COVID-19: Secondary | ICD-10-CM | POA: Diagnosis not present

## 2023-09-23 DIAGNOSIS — N183 Chronic kidney disease, stage 3 unspecified: Secondary | ICD-10-CM | POA: Diagnosis not present

## 2023-09-23 LAB — URINALYSIS
Bilirubin Urine: NEGATIVE
Ketones, ur: NEGATIVE
Leukocytes,Ua: NEGATIVE
Nitrite: NEGATIVE
Specific Gravity, Urine: 1.015 (ref 1.000–1.030)
Total Protein, Urine: NEGATIVE
Urine Glucose: NEGATIVE
Urobilinogen, UA: 0.2 (ref 0.0–1.0)
pH: 6 (ref 5.0–8.0)

## 2023-09-23 MED ORDER — AMLODIPINE BESYLATE 10 MG PO TABS: 10.0000 mg | ORAL_TABLET | Freq: Every day | ORAL | 1 refills | Status: AC

## 2023-09-23 MED ORDER — PROMETHAZINE-DM 6.25-15 MG/5ML PO SYRP: 5.0000 mL | ORAL_SOLUTION | Freq: Four times a day (QID) | ORAL | 0 refills | Status: AC | PRN

## 2023-09-23 NOTE — Assessment & Plan Note (Signed)
Heck A1c

## 2023-09-23 NOTE — Assessment & Plan Note (Signed)
 post-COVID cough August 2025

## 2023-09-23 NOTE — Assessment & Plan Note (Signed)
Lasix prn -  use 1/2 tab Pt is getting labs w/Rheumatology every 2 mo (CMET, CBC)

## 2023-09-23 NOTE — Assessment & Plan Note (Signed)
 08/2023 - w/post-COVID cough August 2025 Prom syr Get a CXR

## 2023-09-23 NOTE — Assessment & Plan Note (Signed)
Monitoring GFR Hydrate well

## 2023-09-23 NOTE — Progress Notes (Signed)
 Subjective:  Patient ID: Deanna Salazar, female    DOB: 08-Jul-1940  Age: 83 y.o. MRN: [id number]  CC: Follow-up (Patient states since she had Covid she has had a bad cough and nothing is being produces. Stated medicine that she is getting for her back is not helping. )   HPI Deanna Salazar presents for post-COVID cough August 2025 F/u HTN, pre-DM  Outpatient Medications Prior to Visit  Medication Sig Dispense Refill   acetaminophen  (TYLENOL ) 500 MG tablet Take 500-1,000 mg by mouth every 6 (six) hours as needed for mild pain, moderate pain, fever or headache.     albuterol  (VENTOLIN  HFA) 108 (90 Base) MCG/ACT inhaler Inhale 2 puffs into the lungs every 6 (six) hours as needed for wheezing or shortness of breath. 18 g 0   aspirin  (ASPIRIN  CHILDRENS) 81 MG chewable tablet Chew 1 tablet (81 mg total) by mouth daily. 36 tablet 11   Blood Glucose Monitoring Suppl (ONE TOUCH ULTRA 2) w/Device KIT Use to check blood sugars daily 1 kit 0   brimonidine (ALPHAGAN) 0.2 % ophthalmic solution Place 1 drop into both eyes in the morning and at bedtime.     carvedilol  (COREG ) 25 MG tablet TAKE 1 TABLET(25 MG) BY MOUTH TWICE DAILY WITH A MEAL 180 tablet 1   cholecalciferol (VITAMIN D ) 1000 units tablet Take 1,000 Units by mouth daily.     folic acid (FOLVITE) 1 MG tablet Take 1 tablet daily     furosemide  (LASIX ) 20 MG tablet TAKE 1 TO 2 TABLETS BY MOUTH DAILY AS NEEDED FOR SWELLING 180 tablet 3   Lancets (ONETOUCH DELICA PLUS LANCET33G) MISC USE TO TEST BLOOD SUGAR LEVELS 100 each 2   latanoprost (XALATAN) 0.005 % ophthalmic solution Place 1 drop into both eyes at bedtime.  3   levothyroxine  (SYNTHROID ) 25 MCG tablet TAKE 1 TABLET BY MOUTH EVERY DAY BEFORE BREAKFAST 90 tablet 1   lidocaine  4 % Place 1 patch onto the skin daily. 12 hours on and 12 hours off 15 patch 0   meclizine  (ANTIVERT ) 12.5 MG tablet Take 1 tablet (12.5 mg total) by mouth 3 (three) times daily as needed for dizziness. 30 tablet 1    metFORMIN  (GLUCOPHAGE ) 500 MG tablet TAKE 1 TABLET BY MOUTH TWICE A DAY WITH FOOD 180 tablet 1   methotrexate  2.5 MG tablet Take 20 mg by mouth once a week.     Multiple Vitamins-Minerals (PRESERVISION AREDS 2+MULTI VIT PO) Take 1 capsule by mouth in the morning and at bedtime.     ONETOUCH ULTRA test strip TEST GLUCOSE LEVELS ONCE DAILY 100 strip 3   Polyethylene Glycol 3350  (MIRALAX  PO) Take by mouth.     zolpidem  (AMBIEN ) 10 MG tablet TAKE 10 MG BY MOUTH AT BEDTIME AS NEEDED. 30 tablet 3   amLODipine  (NORVASC ) 10 MG tablet TAKE 1 TABLET BY MOUTH EVERY DAY 90 tablet 1   clotrimazole -betamethasone  (LOTRISONE ) cream Apply 1 Application topically daily. (Patient not taking: Reported on 09/23/2023) 30 g 0   cyclobenzaprine  (FLEXERIL ) 10 MG tablet TAKE 1 TABLET BY MOUTH AT BEDTIME AS NEEDED FOR MUSCLE SPASMS 30 tablet 3   famotidine  (PEPCID ) 40 MG tablet TAKE 1 TABLET BY MOUTH EVERY DAY (Patient not taking: Reported on 09/23/2023) 30 tablet 5   fluticasone  (FLONASE ) 50 MCG/ACT nasal spray Place 2 sprays into both nostrils daily. (Patient not taking: Reported on 09/23/2023) 16 g 6   ketoconazole  (NIZORAL ) 2 % cream Apply 1 Application topically daily. (Patient not  taking: Reported on 09/23/2023) 60 g 0   methocarbamol  (ROBAXIN ) 500 MG tablet Take 1 tablet (500 mg total) by mouth every 8 (eight) hours as needed for muscle spasms (back pain). (Patient not taking: Reported on 09/23/2023) 90 tablet 3   methylPREDNISolone  (MEDROL  DOSEPAK) 4 MG TBPK tablet As directed (Patient not taking: Reported on 09/23/2023) 21 tablet 0   Polyethyl Glycol-Propyl Glycol (SYSTANE OP) Apply 1 drop to eye as needed. (Patient not taking: Reported on 09/23/2023)     triamcinolone  cream (KENALOG ) 0.1 % Apply 1 Application topically 4 (four) times daily. (Patient not taking: Reported on 09/23/2023) 80 g 3   No facility-administered medications prior to visit.    ROS: Review of Systems  Constitutional:  Negative for activity change,  appetite change, chills, fatigue and unexpected weight change.  HENT:  Negative for congestion, mouth sores and sinus pressure.   Eyes:  Negative for visual disturbance.  Respiratory:  Positive for cough. Negative for chest tightness.   Gastrointestinal:  Negative for abdominal pain and nausea.  Genitourinary:  Negative for difficulty urinating, frequency and vaginal pain.  Musculoskeletal:  Positive for back pain. Negative for gait problem.  Skin:  Negative for pallor and rash.  Neurological:  Negative for dizziness, tremors, weakness, numbness and headaches.  Psychiatric/Behavioral:  Negative for confusion and sleep disturbance.     Objective:  BP (!) 140/92   Pulse (!) 59   Temp 98 F (36.7 C) (Oral)   Ht 5' 3 (1.6 m)   Wt 179 lb 3.2 oz (81.3 kg)   SpO2 97%   BMI 31.74 kg/m   BP Readings from Last 3 Encounters:  09/23/23 (!) 140/92  09/03/23 (!) 159/88  06/20/23 (!) 122/56    Wt Readings from Last 3 Encounters:  09/23/23 179 lb 3.2 oz (81.3 kg)  06/20/23 178 lb (80.7 kg)  03/20/23 177 lb (80.3 kg)    Physical Exam Constitutional:      General: She is not in acute distress.    Appearance: She is well-developed. She is obese.  HENT:     Head: Normocephalic.     Right Ear: External ear normal.     Left Ear: External ear normal.     Nose: Nose normal.  Eyes:     General:        Right eye: No discharge.        Left eye: No discharge.     Conjunctiva/sclera: Conjunctivae normal.     Pupils: Pupils are equal, round, and reactive to light.  Neck:     Thyroid : No thyromegaly.     Vascular: No JVD.     Trachea: No tracheal deviation.  Cardiovascular:     Rate and Rhythm: Normal rate and regular rhythm.     Heart sounds: Normal heart sounds.  Pulmonary:     Effort: No respiratory distress.     Breath sounds: No stridor. No wheezing.  Abdominal:     General: Bowel sounds are normal. There is no distension.     Palpations: Abdomen is soft. There is no mass.      Tenderness: There is no abdominal tenderness. There is no guarding or rebound.  Musculoskeletal:        General: Tenderness present.     Cervical back: Normal range of motion and neck supple. No rigidity.     Right lower leg: No edema.     Left lower leg: No edema.  Lymphadenopathy:     Cervical: No cervical adenopathy.  Skin:    Findings: No erythema or rash.  Neurological:     Cranial Nerves: No cranial nerve deficit.     Motor: No abnormal muscle tone.     Coordination: Coordination normal.     Deep Tendon Reflexes: Reflexes normal.  Psychiatric:        Behavior: Behavior normal.        Thought Content: Thought content normal.        Judgment: Judgment normal.     Lab Results  Component Value Date   WBC 5.1 06/20/2023   HGB 11.0 (L) 06/20/2023   HCT 33.3 (L) 06/20/2023   PLT 200.0 06/20/2023   GLUCOSE 100 (H) 06/20/2023   CHOL 187 08/17/2020   TRIG 132.0 08/17/2020   HDL 46.00 08/17/2020   LDLDIRECT 180.9 07/26/2009   LDLCALC 114 (H) 08/17/2020   ALT 11 06/20/2023   AST 18 06/20/2023   NA 142 06/20/2023   K 3.5 06/20/2023   CL 104 06/20/2023   CREATININE 0.96 06/20/2023   BUN 13 06/20/2023   CO2 31 06/20/2023   TSH 1.77 06/20/2023   HGBA1C 5.7 06/20/2023    No results found.  Assessment & Plan:   Problem List Items Addressed This Visit     Cough in adult patient - Primary   08/2023 - w/post-COVID cough August 2025 Prom syr Get a CXR      Relevant Orders   DG Chest 2 View   CBC with Differential/Platelet   CRF (chronic renal failure), stage 3 (moderate) (HCC)   Monitoring GFR Hydrate well      Relevant Orders   CBC with Differential/Platelet   Essential hypertension    Lasix  prn -  use 1/2 tab Pt is getting labs w/Rheumatology every 2 mo (CMET, CBC)        Relevant Medications   amLODipine  (NORVASC ) 10 MG tablet   Other Relevant Orders   CBC with Differential/Platelet   Comprehensive metabolic panel with GFR   Urinalysis   Hemoglobin  A1c   History of COVID-19    post-COVID cough August 2025      Relevant Orders   DG Chest 2 View   CBC with Differential/Platelet   Hyperglycemia   Heck A1c      Relevant Orders   Comprehensive metabolic panel with GFR   Hemoglobin A1c      Meds ordered this encounter  Medications   amLODipine  (NORVASC ) 10 MG tablet    Sig: Take 1 tablet (10 mg total) by mouth daily.    Dispense:  90 tablet    Refill:  1   promethazine -dextromethorphan (PROMETHAZINE -DM) 6.25-15 MG/5ML syrup    Sig: Take 5 mLs by mouth 4 (four) times daily as needed for cough.    Dispense:  240 mL    Refill:  0      Follow-up: Return in about 3 months (around 12/23/2023) for a follow-up visit.  Marolyn Noel, MD

## 2023-09-24 ENCOUNTER — Ambulatory Visit: Payer: Self-pay | Admitting: Internal Medicine

## 2023-09-24 LAB — COMPREHENSIVE METABOLIC PANEL WITH GFR
ALT: 15 U/L (ref 0–35)
AST: 21 U/L (ref 0–37)
Albumin: 4 g/dL (ref 3.5–5.2)
Alkaline Phosphatase: 77 U/L (ref 39–117)
BUN: 14 mg/dL (ref 6–23)
CO2: 29 meq/L (ref 19–32)
Calcium: 9.9 mg/dL (ref 8.4–10.5)
Chloride: 104 meq/L (ref 96–112)
Creatinine, Ser: 0.96 mg/dL (ref 0.40–1.20)
GFR: 54.49 mL/min — ABNORMAL LOW (ref >60.00)
Glucose, Bld: 107 mg/dL — ABNORMAL HIGH (ref 70–99)
Potassium: 3.6 meq/L (ref 3.5–5.1)
Sodium: 141 meq/L (ref 135–145)
Total Bilirubin: 0.4 mg/dL (ref 0.2–1.2)
Total Protein: 6.7 g/dL (ref 6.0–8.3)

## 2023-09-24 LAB — CBC WITH DIFFERENTIAL/PLATELET
Basophils Absolute: 0 K/uL (ref 0.0–0.1)
Basophils Relative: 1.1 % (ref 0.0–3.0)
Eosinophils Absolute: 0.2 K/uL (ref 0.0–0.7)
Eosinophils Relative: 4.9 % (ref 0.0–5.0)
HCT: 34.4 % — ABNORMAL LOW (ref 36.0–46.0)
Hemoglobin: 11.3 g/dL — ABNORMAL LOW (ref 12.0–15.0)
Lymphocytes Relative: 30.4 % (ref 12.0–46.0)
Lymphs Abs: 1.3 K/uL (ref 0.7–4.0)
MCHC: 32.8 g/dL (ref 30.0–36.0)
MCV: 93.3 fl (ref 78.0–100.0)
Monocytes Absolute: 0.6 K/uL (ref 0.1–1.0)
Monocytes Relative: 15.1 % — ABNORMAL HIGH (ref 3.0–12.0)
Neutro Abs: 2.1 K/uL (ref 1.4–7.7)
Neutrophils Relative %: 48.5 % (ref 43.0–77.0)
Platelets: 198 K/uL (ref 150.0–400.0)
RBC: 3.69 Mil/uL — ABNORMAL LOW (ref 3.87–5.11)
RDW: 14.8 % (ref 11.5–15.5)
WBC: 4.3 K/uL (ref 4.0–10.5)

## 2023-09-24 LAB — HEMOGLOBIN A1C: Hgb A1c MFr Bld: 6.1 % (ref 4.6–6.5)

## 2023-09-27 ENCOUNTER — Telehealth: Payer: Self-pay

## 2023-09-27 NOTE — Telephone Encounter (Signed)
 Copied from CRM [id number]. Topic: Medical Record Request - Records Request >> Sep 27, 2023 10:36 AM Viola F wrote: Patient needs lab results from 09/23/23 sent to her Rheumatologist - Attention Rosaline, in order for her to get a prescription filled. She doesn't know the name of the office/provider, sher said the office is on [address].

## 2023-09-30 NOTE — Telephone Encounter (Signed)
 Patient read message that was sent.  Last read by Deanna Salazar at 12:56PM on 09/30/2023.

## 2023-10-01 ENCOUNTER — Ambulatory Visit (INDEPENDENT_AMBULATORY_CARE_PROVIDER_SITE_OTHER): Admitting: Podiatry

## 2023-10-01 DIAGNOSIS — B351 Tinea unguium: Secondary | ICD-10-CM

## 2023-10-01 DIAGNOSIS — M79674 Pain in right toe(s): Secondary | ICD-10-CM | POA: Diagnosis not present

## 2023-10-01 DIAGNOSIS — M79675 Pain in left toe(s): Secondary | ICD-10-CM | POA: Diagnosis not present

## 2023-10-01 NOTE — Progress Notes (Signed)
 Subjective: Chief Complaint  Patient presents with   RFC    RFC no calluses. Nails still dark  Pre diabetic. 81 mg Asprin    83 year old female presents the office for above concerns.  Her nails are thick and elongated she cannot do them herself and are causing discomfort.  No acute changes. Last A1c 5.7 on 06/20/2023  Plotnikov, Deanna GAILS, MD Last seen 09/23/2023   Objective: AAO x3, NAD DP/PT pulses palpable bilaterally, CRT less than 3 seconds The toenails continue to be dystrophic with yellow, brown discoloration on the right.  There is still dark discoloration present to the nails on the left foot.  They are uniform in color.  Do not appear to be any worsening and there is no extension into the surrounding skin.  She gets tenderness nails 1-5 bilaterally. Hammertoes present. No pain with calf compression, swelling, warmth, erythema  Assessment: Skin lesion; symptomatic onychomycosis; porkertosis  Plan: Symptomatic diagnosis -Sharply debrided the nails x 10 without any complications or bleeding.  She can continue with topical medication.  The pigment changes all uniform in the left foot on all the toenails.  I think this is a benign pigment change.  Will continue to monitor.  Return in about 3 months (around 12/31/2023).  Deanna Salazar DPM

## 2023-10-03 DIAGNOSIS — M545 Low back pain, unspecified: Secondary | ICD-10-CM | POA: Diagnosis not present

## 2023-10-03 DIAGNOSIS — E669 Obesity, unspecified: Secondary | ICD-10-CM | POA: Diagnosis not present

## 2023-10-03 DIAGNOSIS — Z6831 Body mass index (BMI) 31.0-31.9, adult: Secondary | ICD-10-CM | POA: Diagnosis not present

## 2023-10-03 DIAGNOSIS — M0609 Rheumatoid arthritis without rheumatoid factor, multiple sites: Secondary | ICD-10-CM | POA: Diagnosis not present

## 2023-10-03 DIAGNOSIS — M25561 Pain in right knee: Secondary | ICD-10-CM | POA: Diagnosis not present

## 2023-10-03 DIAGNOSIS — M1711 Unilateral primary osteoarthritis, right knee: Secondary | ICD-10-CM | POA: Diagnosis not present

## 2023-11-08 DIAGNOSIS — H401111 Primary open-angle glaucoma, right eye, mild stage: Secondary | ICD-10-CM | POA: Diagnosis not present

## 2023-11-08 DIAGNOSIS — Z961 Presence of intraocular lens: Secondary | ICD-10-CM | POA: Diagnosis not present

## 2023-11-08 DIAGNOSIS — H401122 Primary open-angle glaucoma, left eye, moderate stage: Secondary | ICD-10-CM | POA: Diagnosis not present

## 2023-11-13 ENCOUNTER — Other Ambulatory Visit: Payer: Self-pay | Admitting: Internal Medicine

## 2023-11-25 ENCOUNTER — Other Ambulatory Visit: Payer: Self-pay | Admitting: Internal Medicine

## 2023-11-27 ENCOUNTER — Other Ambulatory Visit: Payer: Self-pay | Admitting: Internal Medicine

## 2023-11-27 NOTE — Telephone Encounter (Unsigned)
 Copied from CRM [id number]. Topic: Clinical - Medication Refill >> Nov 27, 2023  2:38 PM Brittany M wrote: Medication: carvedilol  (COREG ) 25 MG tablet  Has the patient contacted their pharmacy? Yes (Agent: If no, request that the patient contact the pharmacy for the refill. If patient does not wish to contact the pharmacy document the reason why and proceed with request.) (Agent: If yes, when and what did the pharmacy advise?)  This is the patient's preferred pharmacy:  [id number] [place], [place] - [address] AT [address] [facility] [address] [place] [address] Phone: [phone] Fax: [phone]  Is this the correct pharmacy for this prescription? Yes If no, delete pharmacy and type the correct one.   Has the prescription been filled recently? Yes  Is the patient out of the medication? Yes  Has the patient been seen for an appointment in the last year OR does the patient have an upcoming appointment? Yes  Can we respond through MyChart? Yes  Agent: Please be advised that Rx refills may take up to 3 business days. We ask that you follow-up with your pharmacy.

## 2023-11-28 ENCOUNTER — Ambulatory Visit (INDEPENDENT_AMBULATORY_CARE_PROVIDER_SITE_OTHER)

## 2023-11-28 DIAGNOSIS — Z23 Encounter for immunization: Secondary | ICD-10-CM | POA: Diagnosis not present

## 2023-11-28 NOTE — Progress Notes (Cosign Needed Addendum)
 Flu vaccine given with no complication.  Medical screening examination/treatment/procedure(s) were performed by non-physician practitioner and as supervising physician I was immediately available for consultation/collaboration.  I agree with above. Karlynn Noel, MD

## 2023-12-17 ENCOUNTER — Other Ambulatory Visit: Payer: Self-pay | Admitting: Internal Medicine

## 2023-12-25 ENCOUNTER — Ambulatory Visit: Admitting: Internal Medicine

## 2023-12-25 ENCOUNTER — Encounter: Payer: Self-pay | Admitting: Internal Medicine

## 2023-12-25 VITALS — BP 108/58 | HR 62 | Temp 99.1°F | Ht 63.0 in | Wt 181.2 lb

## 2023-12-25 DIAGNOSIS — E1122 Type 2 diabetes mellitus with diabetic chronic kidney disease: Secondary | ICD-10-CM

## 2023-12-25 DIAGNOSIS — Z7984 Long term (current) use of oral hypoglycemic drugs: Secondary | ICD-10-CM

## 2023-12-25 DIAGNOSIS — N183 Chronic kidney disease, stage 3 unspecified: Secondary | ICD-10-CM | POA: Diagnosis not present

## 2023-12-25 DIAGNOSIS — F5101 Primary insomnia: Secondary | ICD-10-CM

## 2023-12-25 DIAGNOSIS — E119 Type 2 diabetes mellitus without complications: Secondary | ICD-10-CM

## 2023-12-25 DIAGNOSIS — K5904 Chronic idiopathic constipation: Secondary | ICD-10-CM | POA: Diagnosis not present

## 2023-12-25 LAB — COMPREHENSIVE METABOLIC PANEL WITH GFR
ALT: 12 U/L (ref 3–35)
AST: 21 U/L (ref 5–37)
Albumin: 4.1 g/dL (ref 3.5–5.2)
Alkaline Phosphatase: 71 U/L (ref 39–117)
BUN: 16 mg/dL (ref 6–23)
CO2: 30 meq/L (ref 19–32)
Calcium: 9.9 mg/dL (ref 8.4–10.5)
Chloride: 104 meq/L (ref 96–112)
Creatinine, Ser: 0.98 mg/dL (ref 0.40–1.20)
GFR: 53.07 mL/min — ABNORMAL LOW (ref >60.00)
Glucose, Bld: 91 mg/dL (ref 70–99)
Potassium: 3.8 meq/L (ref 3.5–5.1)
Sodium: 139 meq/L (ref 135–145)
Total Bilirubin: 0.5 mg/dL (ref 0.2–1.2)
Total Protein: 6.8 g/dL (ref 6.0–8.3)

## 2023-12-25 LAB — HEMOGLOBIN A1C: Hgb A1c MFr Bld: 5.6 % (ref 4.6–6.5)

## 2023-12-25 MED ORDER — DIAZEPAM 5 MG PO TABS: 5.0000 mg | ORAL_TABLET | Freq: Two times a day (BID) | ORAL | 1 refills | Status: AC | PRN

## 2023-12-25 NOTE — Assessment & Plan Note (Signed)
 On Metformin

## 2023-12-25 NOTE — Progress Notes (Signed)
 Subjective:  Patient ID: Deanna Salazar, female    DOB: 16-May-1940  Age: 83 y.o. MRN: [id number]  CC: Medical Management of Chronic Issues (3 Month follow up. Notes methocarbamol  doesn't seem to help with back pain. Still having issues with constipation as well (down to one bowel movement a week, and that's with the dulcolax))   HPI Deanna Salazar presents for LBP, OA, constipation - chronic C/o not being able to sleep  Outpatient Medications Prior to Visit  Medication Sig Dispense Refill   acetaminophen  (TYLENOL ) 500 MG tablet Take 500-1,000 mg by mouth every 6 (six) hours as needed for mild pain, moderate pain, fever or headache.     albuterol  (VENTOLIN  HFA) 108 (90 Base) MCG/ACT inhaler Inhale 2 puffs into the lungs every 6 (six) hours as needed for wheezing or shortness of breath. 18 g 0   amLODipine  (NORVASC ) 10 MG tablet Take 1 tablet (10 mg total) by mouth daily. 90 tablet 1   aspirin  (ASPIRIN  CHILDRENS) 81 MG chewable tablet Chew 1 tablet (81 mg total) by mouth daily. 36 tablet 11   Blood Glucose Monitoring Suppl (ONE TOUCH ULTRA 2) w/Device KIT Use to check blood sugars daily 1 kit 0   brimonidine (ALPHAGAN) 0.2 % ophthalmic solution Place 1 drop into both eyes in the morning and at bedtime.     carvedilol  (COREG ) 25 MG tablet TAKE 1 TABLET(25 MG) BY MOUTH TWICE DAILY WITH A MEAL 180 tablet 1   cholecalciferol (VITAMIN D ) 1000 units tablet Take 1,000 Units by mouth daily.     clotrimazole -betamethasone  (LOTRISONE ) cream Apply 1 Application topically daily. 30 g 0   cyclobenzaprine  (FLEXERIL ) 10 MG tablet TAKE 1 TABLET BY MOUTH AT BEDTIME AS NEEDED FOR MUSCLE SPASMS 30 tablet 3   famotidine  (PEPCID ) 40 MG tablet TAKE 1 TABLET BY MOUTH EVERY DAY 30 tablet 5   fluticasone  (FLONASE ) 50 MCG/ACT nasal spray Place 2 sprays into both nostrils daily. 16 g 6   folic acid (FOLVITE) 1 MG tablet Take 1 tablet daily     furosemide  (LASIX ) 20 MG tablet TAKE 1 TO 2 TABLETS BY MOUTH DAILY AS  NEEDED FOR SWELLING 180 tablet 3   ketoconazole  (NIZORAL ) 2 % cream Apply 1 Application topically daily. 60 g 0   Lancets (ONETOUCH DELICA PLUS LANCET33G) MISC USE TO TEST BLOOD SUGAR LEVELS 100 each 2   latanoprost (XALATAN) 0.005 % ophthalmic solution Place 1 drop into both eyes at bedtime.  3   levothyroxine  (SYNTHROID ) 25 MCG tablet TAKE 1 TABLET BY MOUTH EVERY DAY BEFORE BREAKFAST 90 tablet 1   lidocaine  4 % Place 1 patch onto the skin daily. 12 hours on and 12 hours off 15 patch 0   meclizine  (ANTIVERT ) 12.5 MG tablet Take 1 tablet (12.5 mg total) by mouth 3 (three) times daily as needed for dizziness. 30 tablet 1   metFORMIN  (GLUCOPHAGE ) 500 MG tablet TAKE 1 TABLET BY MOUTH TWICE A DAY WITH FOOD 180 tablet 1   methocarbamol  (ROBAXIN ) 500 MG tablet TAKE 1 TABLET BY MOUTH EVERY 8 HOURS AS NEEDED FOR MUSCLE SPASMS/BACK PAIN 90 tablet 3   methotrexate  2.5 MG tablet Take 20 mg by mouth once a week.     methylPREDNISolone  (MEDROL  DOSEPAK) 4 MG TBPK tablet As directed 21 tablet 0   Multiple Vitamins-Minerals (PRESERVISION AREDS 2+MULTI VIT PO) Take 1 capsule by mouth in the morning and at bedtime.     ONETOUCH ULTRA test strip TEST GLUCOSE LEVELS ONCE DAILY 100  strip 3   Polyethyl Glycol-Propyl Glycol (SYSTANE OP) Apply 1 drop to eye as needed.     Polyethylene Glycol 3350  (MIRALAX  PO) Take by mouth.     promethazine -dextromethorphan (PROMETHAZINE -DM) 6.25-15 MG/5ML syrup Take 5 mLs by mouth 4 (four) times daily as needed for cough. 240 mL 0   triamcinolone  cream (KENALOG ) 0.1 % Apply 1 Application topically 4 (four) times daily. 80 g 3   zolpidem  (AMBIEN ) 10 MG tablet TAKE 10 MG BY MOUTH AT BEDTIME AS NEEDED. 30 tablet 3   No facility-administered medications prior to visit.    ROS: Review of Systems  Constitutional:  Positive for fatigue. Negative for activity change, appetite change, chills and unexpected weight change.  HENT:  Negative for congestion, mouth sores and sinus pressure.    Eyes:  Negative for visual disturbance.  Respiratory:  Negative for cough and chest tightness.   Gastrointestinal:  Positive for constipation. Negative for abdominal pain and nausea.  Genitourinary:  Negative for difficulty urinating, frequency and vaginal pain.  Musculoskeletal:  Negative for back pain and gait problem.  Skin:  Negative for pallor and rash.  Neurological:  Negative for dizziness, tremors, weakness, numbness and headaches.  Psychiatric/Behavioral:  Positive for sleep disturbance. Negative for confusion and suicidal ideas. The patient is nervous/anxious.     Objective:  BP (!) 108/58   Pulse 62   Temp 99.1 F (37.3 C)   Ht 5' 3 (1.6 m)   Wt 181 lb 3.2 oz (82.2 kg)   SpO2 99%   BMI 32.10 kg/m   BP Readings from Last 3 Encounters:  12/25/23 (!) 108/58  09/23/23 (!) 140/92  09/03/23 (!) 159/88    Wt Readings from Last 3 Encounters:  12/25/23 181 lb 3.2 oz (82.2 kg)  09/23/23 179 lb 3.2 oz (81.3 kg)  06/20/23 178 lb (80.7 kg)    Physical Exam Constitutional:      General: She is not in acute distress.    Appearance: She is well-developed. She is obese.  HENT:     Head: Normocephalic.     Right Ear: External ear normal.     Left Ear: External ear normal.     Nose: Nose normal.  Eyes:     General:        Right eye: No discharge.        Left eye: No discharge.     Conjunctiva/sclera: Conjunctivae normal.     Pupils: Pupils are equal, round, and reactive to light.  Neck:     Thyroid : No thyromegaly.     Vascular: No JVD.     Trachea: No tracheal deviation.  Cardiovascular:     Rate and Rhythm: Normal rate and regular rhythm.     Heart sounds: Normal heart sounds.  Pulmonary:     Effort: No respiratory distress.     Breath sounds: No stridor. No wheezing.  Abdominal:     General: Bowel sounds are normal. There is no distension.     Palpations: Abdomen is soft. There is no mass.     Tenderness: There is no abdominal tenderness. There is no  guarding or rebound.  Musculoskeletal:        General: No tenderness.     Cervical back: Normal range of motion and neck supple. No rigidity.  Lymphadenopathy:     Cervical: No cervical adenopathy.  Skin:    Findings: No erythema or rash.  Neurological:     Mental Status: She is oriented to person, place, and  time.     Cranial Nerves: No cranial nerve deficit.     Motor: No abnormal muscle tone.     Coordination: Coordination normal.     Deep Tendon Reflexes: Reflexes normal.  Psychiatric:        Behavior: Behavior normal.        Thought Content: Thought content normal.        Judgment: Judgment normal.     Lab Results  Component Value Date   WBC 4.3 09/23/2023   HGB 11.3 (L) 09/23/2023   HCT 34.4 (L) 09/23/2023   PLT 198.0 09/23/2023   GLUCOSE 107 (H) 09/23/2023   CHOL 187 08/17/2020   TRIG 132.0 08/17/2020   HDL 46.00 08/17/2020   LDLDIRECT 180.9 07/26/2009   LDLCALC 114 (H) 08/17/2020   ALT 15 09/23/2023   AST 21 09/23/2023   NA 141 09/23/2023   K 3.6 09/23/2023   CL 104 09/23/2023   CREATININE 0.96 09/23/2023   BUN 14 09/23/2023   CO2 29 09/23/2023   TSH 1.77 06/20/2023   HGBA1C 6.1 09/23/2023    No results found.  Assessment & Plan:   Problem List Items Addressed This Visit     Constipation   Use Miralax  2-3 scoops a day in water. Use Dulcolax suppository in 1 h after you took Miralax       CRF (chronic renal failure), stage 3 (moderate) (HCC) - Primary   Monitoring GFR Hydrate well      Diabetes type 2, controlled (HCC)   On Metformin       Relevant Orders   Comprehensive metabolic panel with GFR   Hemoglobin A1c   Insomnia   D/c Zolpidem   Try Diazepam  5 mg qhs  Potential benefits of a long term benzodiazepines  use as well as potential risks  and complications were explained to the patient and were aknowledged.          Meds ordered this encounter  Medications   diazepam  (VALIUM ) 5 MG tablet    Sig: Take 1 tablet (5 mg total) by  mouth every 12 (twelve) hours as needed for anxiety.    Dispense:  30 tablet    Refill:  1      Follow-up: Return in about 3 months (around 03/24/2024) for a follow-up visit.  Marolyn Noel, MD

## 2023-12-25 NOTE — Assessment & Plan Note (Signed)
 Use Miralax  2-3 scoops a day in water. Use Dulcolax suppository in 1 h after you took Miralax 

## 2023-12-25 NOTE — Patient Instructions (Addendum)
 Use Miralax  2-3 scoops a day in water. Use Dulcolax suppository in 1 h after you took Miralax   Some manufacturers add a fruity flavor to mask the Metformin  flavor.

## 2023-12-25 NOTE — Assessment & Plan Note (Addendum)
 D/c Zolpidem   Try Diazepam  5 mg qhs  Potential benefits of a long term benzodiazepines  use as well as potential risks  and complications were explained to the patient and were aknowledged.

## 2023-12-25 NOTE — Assessment & Plan Note (Signed)
Monitoring GFR Hydrate well

## 2023-12-31 ENCOUNTER — Ambulatory Visit (INDEPENDENT_AMBULATORY_CARE_PROVIDER_SITE_OTHER): Admitting: Podiatry

## 2023-12-31 VITALS — Ht 63.0 in | Wt 181.2 lb

## 2023-12-31 DIAGNOSIS — M79674 Pain in right toe(s): Secondary | ICD-10-CM | POA: Diagnosis not present

## 2023-12-31 DIAGNOSIS — M79675 Pain in left toe(s): Secondary | ICD-10-CM | POA: Diagnosis not present

## 2023-12-31 DIAGNOSIS — B351 Tinea unguium: Secondary | ICD-10-CM | POA: Diagnosis not present

## 2023-12-31 NOTE — Progress Notes (Signed)
 Subjective: Chief Complaint  Patient presents with   Nail Problem    RM 2 RFC    83 year old female presents the office for above concerns.  Her nails are thick and elongated she cannot do them herself and are causing discomfort.  No acute changes.  Last A1c 5.6 on 12/25/2023  Plotnikov, Deanna GAILS, MD Last seen 12/25/2023   Objective: AAO x3, NAD DP/PT pulses palpable bilaterally, CRT less than 3 seconds Nails are hypertrophic, dystrophic, brittle, discolored, elongated 10. No surrounding redness or drainage. Tenderness nails 1-5 bilaterally.  Nails have brown, dark discoloration to all the toenails.  They are uniform in color without any extension of any discoloration to the skin.  They are all the same in color.  No open lesions or pre-ulcerative lesions are identified today. Hammertoes present. No pain with calf compression, swelling, warmth, erythema  Assessment: Symptomatic onychomycosis  Plan: Symptomatic diagnosis -Sharply debrided the nails x 10 without any complications or bleeding.  She can continue with topical medication.    Return in about 3 months (around 03/30/2024) for nail trim .  Deanna Salazar DPM

## 2024-01-23 ENCOUNTER — Telehealth: Payer: Self-pay

## 2024-01-23 NOTE — Telephone Encounter (Signed)
 Copied from CRM [id number]. Topic: Clinical - Medical Advice >> Jan 23, 2024 10:49 AM Montie POUR wrote: Reason for CRM:  Please call Deanna Salazar back to let her know if she needs a RSV and Pneumonia vaccines Chart shows this: Pneumococcal Conjugate-20 08/17/2020 (84 y.o.)  Her number is [phone]

## 2024-01-27 NOTE — Telephone Encounter (Signed)
 Spoke with the pt and was able to inform her of PCP advice as stated She can get RSV vaccine. Thanks   Pt has stated understanding.

## 2024-01-27 NOTE — Telephone Encounter (Signed)
 She can get RSV vaccine.  Thanks

## 2024-02-14 ENCOUNTER — Other Ambulatory Visit: Payer: Self-pay | Admitting: Internal Medicine

## 2024-02-14 ENCOUNTER — Other Ambulatory Visit: Payer: Self-pay

## 2024-02-14 DIAGNOSIS — E119 Type 2 diabetes mellitus without complications: Secondary | ICD-10-CM

## 2024-02-14 MED ORDER — ACCU-CHEK GUIDE W/DEVICE KIT: PACK | 0 refills | Status: AC

## 2024-02-14 MED ORDER — ACCU-CHEK SOFTCLIX LANCETS MISC: 12 refills | Status: AC

## 2024-02-14 MED ORDER — ACCU-CHEK GUIDE TEST VI STRP: ORAL_STRIP | 12 refills | Status: AC

## 2024-03-16 ENCOUNTER — Ambulatory Visit

## 2024-03-24 ENCOUNTER — Ambulatory Visit: Admitting: Internal Medicine

## 2024-03-30 ENCOUNTER — Ambulatory Visit: Admitting: Podiatry
